# Patient Record
Sex: Male | Born: 1935 | Race: White | Hispanic: No | Marital: Married | Smoking: Former smoker
Health system: Southern US, Community
[De-identification: ages and names within clinical notes are randomized; demographics above are authoritative.]

## PROBLEM LIST (undated history)

## (undated) DIAGNOSIS — R2689 Other abnormalities of gait and mobility: Secondary | ICD-10-CM

## (undated) DIAGNOSIS — K2270 Barrett's esophagus without dysplasia: Secondary | ICD-10-CM

## (undated) DIAGNOSIS — R351 Nocturia: Secondary | ICD-10-CM

## (undated) DIAGNOSIS — R001 Bradycardia, unspecified: Secondary | ICD-10-CM

## (undated) DIAGNOSIS — K219 Gastro-esophageal reflux disease without esophagitis: Secondary | ICD-10-CM

## (undated) DIAGNOSIS — N2889 Other specified disorders of kidney and ureter: Secondary | ICD-10-CM

## (undated) DIAGNOSIS — C439 Malignant melanoma of skin, unspecified: Secondary | ICD-10-CM

## (undated) DIAGNOSIS — I2510 Atherosclerotic heart disease of native coronary artery without angina pectoris: Secondary | ICD-10-CM

## (undated) DIAGNOSIS — I219 Acute myocardial infarction, unspecified: Secondary | ICD-10-CM

## (undated) DIAGNOSIS — M1990 Unspecified osteoarthritis, unspecified site: Secondary | ICD-10-CM

## (undated) DIAGNOSIS — I10 Essential (primary) hypertension: Secondary | ICD-10-CM

## (undated) DIAGNOSIS — E7800 Pure hypercholesterolemia, unspecified: Secondary | ICD-10-CM

## (undated) DIAGNOSIS — Z951 Presence of aortocoronary bypass graft: Secondary | ICD-10-CM

## (undated) DIAGNOSIS — Z8679 Personal history of other diseases of the circulatory system: Secondary | ICD-10-CM

## (undated) HISTORY — PX: NASAL POLYP EXCISION: SHX2068

## (undated) HISTORY — PX: UPPER GI ENDOSCOPY: SHX6162

## (undated) HISTORY — PX: EYE SURGERY: SHX253

## (undated) HISTORY — PX: COLONOSCOPY: SHX174

## (undated) HISTORY — DX: Acute myocardial infarction, unspecified: I21.9

## (undated) HISTORY — DX: Pure hypercholesterolemia, unspecified: E78.00

## (undated) HISTORY — PX: HEMORRHOID SURGERY: SHX153

## (undated) HISTORY — DX: Nocturia: R35.1

## (undated) HISTORY — DX: Bradycardia, unspecified: R00.1

## (undated) HISTORY — DX: Presence of aortocoronary bypass graft: Z95.1

## (undated) HISTORY — DX: Atherosclerotic heart disease of native coronary artery without angina pectoris: I25.10

## (undated) HISTORY — DX: Personal history of other diseases of the circulatory system: Z86.79

## (undated) HISTORY — DX: Essential (primary) hypertension: I10

---

## 1998-02-23 ENCOUNTER — Other Ambulatory Visit: Admission: RE | Admit: 1998-02-23 | Discharge: 1998-02-23 | Payer: Self-pay | Admitting: Urology

## 1998-08-31 ENCOUNTER — Ambulatory Visit (HOSPITAL_COMMUNITY): Admission: RE | Admit: 1998-08-31 | Discharge: 1998-08-31 | Payer: Self-pay | Admitting: Gastroenterology

## 1998-11-30 ENCOUNTER — Ambulatory Visit (HOSPITAL_COMMUNITY): Admission: RE | Admit: 1998-11-30 | Discharge: 1998-11-30 | Payer: Self-pay | Admitting: Gastroenterology

## 1999-05-03 ENCOUNTER — Ambulatory Visit (HOSPITAL_COMMUNITY): Admission: RE | Admit: 1999-05-03 | Discharge: 1999-05-03 | Payer: Self-pay | Admitting: Urology

## 2000-04-09 ENCOUNTER — Encounter (INDEPENDENT_AMBULATORY_CARE_PROVIDER_SITE_OTHER): Payer: Self-pay | Admitting: Specialist

## 2000-04-09 ENCOUNTER — Ambulatory Visit (HOSPITAL_COMMUNITY): Admission: RE | Admit: 2000-04-09 | Discharge: 2000-04-09 | Payer: Self-pay | Admitting: Gastroenterology

## 2002-06-05 ENCOUNTER — Observation Stay (HOSPITAL_COMMUNITY): Admission: RE | Admit: 2002-06-05 | Discharge: 2002-06-06 | Payer: Self-pay | Admitting: Urology

## 2002-06-05 HISTORY — PX: OTHER SURGICAL HISTORY: SHX169

## 2002-08-04 ENCOUNTER — Encounter (INDEPENDENT_AMBULATORY_CARE_PROVIDER_SITE_OTHER): Payer: Self-pay | Admitting: *Deleted

## 2002-08-04 ENCOUNTER — Ambulatory Visit (HOSPITAL_COMMUNITY): Admission: RE | Admit: 2002-08-04 | Discharge: 2002-08-04 | Payer: Self-pay | Admitting: Gastroenterology

## 2002-08-21 ENCOUNTER — Observation Stay (HOSPITAL_COMMUNITY): Admission: RE | Admit: 2002-08-21 | Discharge: 2002-08-22 | Payer: Self-pay | Admitting: Urology

## 2004-01-04 ENCOUNTER — Encounter: Admission: RE | Admit: 2004-01-04 | Discharge: 2004-01-04 | Payer: Self-pay | Admitting: Gastroenterology

## 2005-08-09 ENCOUNTER — Ambulatory Visit: Payer: Self-pay | Admitting: Ophthalmology

## 2008-06-02 ENCOUNTER — Ambulatory Visit: Payer: Self-pay | Admitting: Ophthalmology

## 2008-07-08 ENCOUNTER — Ambulatory Visit: Payer: Self-pay | Admitting: Otolaryngology

## 2008-07-16 ENCOUNTER — Ambulatory Visit: Payer: Self-pay | Admitting: Otolaryngology

## 2009-03-03 ENCOUNTER — Ambulatory Visit: Payer: Self-pay | Admitting: Ophthalmology

## 2009-03-10 ENCOUNTER — Ambulatory Visit: Payer: Self-pay | Admitting: Ophthalmology

## 2009-12-14 ENCOUNTER — Encounter: Admission: RE | Admit: 2009-12-14 | Discharge: 2009-12-14 | Payer: Self-pay | Admitting: Cardiology

## 2010-01-04 ENCOUNTER — Ambulatory Visit (HOSPITAL_COMMUNITY): Admission: RE | Admit: 2010-01-04 | Discharge: 2010-01-04 | Payer: Self-pay | Admitting: Cardiology

## 2010-01-04 HISTORY — PX: CARDIAC CATHETERIZATION: SHX172

## 2010-01-06 ENCOUNTER — Ambulatory Visit: Payer: Self-pay | Admitting: Surgery

## 2010-02-03 ENCOUNTER — Ambulatory Visit: Payer: Self-pay | Admitting: Surgery

## 2010-02-03 ENCOUNTER — Encounter: Payer: Self-pay | Admitting: Surgery

## 2010-02-07 ENCOUNTER — Inpatient Hospital Stay (HOSPITAL_COMMUNITY): Admission: RE | Admit: 2010-02-07 | Discharge: 2010-02-11 | Payer: Self-pay | Admitting: Surgery

## 2010-02-07 ENCOUNTER — Ambulatory Visit: Payer: Self-pay | Admitting: Surgery

## 2010-02-07 HISTORY — PX: CORONARY ARTERY BYPASS GRAFT: SHX141

## 2010-02-28 ENCOUNTER — Ambulatory Visit: Payer: Self-pay | Admitting: Cardiology

## 2010-03-01 ENCOUNTER — Ambulatory Visit: Payer: Self-pay | Admitting: Surgery

## 2010-03-01 ENCOUNTER — Encounter: Admission: RE | Admit: 2010-03-01 | Discharge: 2010-03-01 | Payer: Self-pay | Admitting: Surgery

## 2010-03-03 ENCOUNTER — Encounter: Payer: Self-pay | Admitting: Cardiology

## 2010-03-31 ENCOUNTER — Encounter: Payer: Self-pay | Admitting: Cardiology

## 2010-03-31 ENCOUNTER — Ambulatory Visit: Payer: Self-pay | Admitting: Cardiology

## 2010-04-30 ENCOUNTER — Encounter: Payer: Self-pay | Admitting: Cardiology

## 2010-10-15 LAB — BASIC METABOLIC PANEL
BUN: 7 mg/dL (ref 6–23)
Creatinine, Ser: 1.14 mg/dL (ref 0.4–1.5)
GFR calc Af Amer: >60 mL/min (ref >60)
GFR calc non Af Amer: >60 mL/min (ref >60)
Glucose, Bld: 133 mg/dL — ABNORMAL HIGH (ref 70–99)

## 2010-10-15 LAB — MAGNESIUM: Magnesium: 1.8 mg/dL (ref 1.5–2.5)

## 2010-10-16 LAB — POCT I-STAT 4, (NA,K, GLUC, HGB,HCT)
Glucose, Bld: 122 mg/dL — ABNORMAL HIGH (ref 70–99)
Glucose, Bld: 124 mg/dL — ABNORMAL HIGH (ref 70–99)
Glucose, Bld: 135 mg/dL — ABNORMAL HIGH (ref 70–99)
HCT: 27 % — ABNORMAL LOW (ref 39.0–52.0)
HCT: 36 % — ABNORMAL LOW (ref 39.0–52.0)
HCT: 36 % — ABNORMAL LOW (ref 39.0–52.0)
Hemoglobin: 8.8 g/dL — ABNORMAL LOW (ref 13.0–17.0)
Potassium: 3.7 mEq/L (ref 3.5–5.1)
Potassium: 3.9 mEq/L (ref 3.5–5.1)
Potassium: 4.5 mEq/L (ref 3.5–5.1)
Sodium: 136 mEq/L (ref 135–145)
Sodium: 141 mEq/L (ref 135–145)
Sodium: 143 mEq/L (ref 135–145)

## 2010-10-16 LAB — SURGICAL PCR SCREEN: MRSA, PCR: NEGATIVE

## 2010-10-16 LAB — POCT I-STAT 3, ART BLOOD GAS (G3+)
Acid-base deficit: 1 mmol/L (ref 0.0–2.0)
Acid-base deficit: 4 mmol/L — ABNORMAL HIGH (ref 0.0–2.0)
Bicarbonate: 23.6 mEq/L (ref 20.0–24.0)
Bicarbonate: 25.2 mEq/L — ABNORMAL HIGH (ref 20.0–24.0)
O2 Saturation: 100 %
O2 Saturation: 100 %
O2 Saturation: 98 %
Patient temperature: 36.6
TCO2: 25 mmol/L (ref 0–100)
TCO2: 26 mmol/L (ref 0–100)
pCO2 arterial: 39.7 mmHg (ref 35.0–45.0)
pH, Arterial: 7.361 (ref 7.350–7.450)
pH, Arterial: 7.376 (ref 7.350–7.450)
pH, Arterial: 7.417 (ref 7.350–7.450)
pO2, Arterial: 112 mmHg — ABNORMAL HIGH (ref 80.0–100.0)

## 2010-10-16 LAB — COMPREHENSIVE METABOLIC PANEL
ALT: 20 U/L (ref 0–53)
AST: 17 U/L (ref 0–37)
Albumin: 4 g/dL (ref 3.5–5.2)
Alkaline Phosphatase: 57 U/L (ref 39–117)
BUN: 10 mg/dL (ref 6–23)
CO2: 21 mEq/L (ref 19–32)
Calcium: 9.1 mg/dL (ref 8.4–10.5)
Chloride: 106 mEq/L (ref 96–112)
Creatinine, Ser: 0.96 mg/dL (ref 0.4–1.5)
GFR calc non Af Amer: >60 mL/min (ref >60)
Total Bilirubin: 0.6 mg/dL (ref 0.3–1.2)

## 2010-10-16 LAB — POCT I-STAT, CHEM 8
Calcium, Ion: 1.09 mmol/L — ABNORMAL LOW (ref 1.12–1.32)
Chloride: 107 mEq/L (ref 96–112)
Creatinine, Ser: 1 mg/dL (ref 0.4–1.5)
Glucose, Bld: 139 mg/dL — ABNORMAL HIGH (ref 70–99)
HCT: 31 % — ABNORMAL LOW (ref 39.0–52.0)
Potassium: 4.1 mEq/L (ref 3.5–5.1)

## 2010-10-16 LAB — CBC
HCT: 28.1 % — ABNORMAL LOW (ref 39.0–52.0)
HCT: 32.6 % — ABNORMAL LOW (ref 39.0–52.0)
HCT: 32.8 % — ABNORMAL LOW (ref 39.0–52.0)
Hemoglobin: 10.3 g/dL — ABNORMAL LOW (ref 13.0–17.0)
Hemoglobin: 9.8 g/dL — ABNORMAL LOW (ref 13.0–17.0)
MCH: 34.5 pg — ABNORMAL HIGH (ref 26.0–34.0)
MCHC: 33.9 g/dL (ref 30.0–36.0)
MCHC: 34.9 g/dL (ref 30.0–36.0)
MCV: 100 fL (ref 78.0–100.0)
MCV: 100.5 fL — ABNORMAL HIGH (ref 78.0–100.0)
MCV: 100.6 fL — ABNORMAL HIGH (ref 78.0–100.0)
Platelets: 104 10*3/uL — ABNORMAL LOW (ref 150–400)
Platelets: 107 10*3/uL — ABNORMAL LOW (ref 150–400)
Platelets: 112 10*3/uL — ABNORMAL LOW (ref 150–400)
RBC: 2.81 MIL/uL — ABNORMAL LOW (ref 4.22–5.81)
RBC: 2.98 MIL/uL — ABNORMAL LOW (ref 4.22–5.81)
RDW: 12.7 % (ref 11.5–15.5)
RDW: 13 % (ref 11.5–15.5)
RDW: 13.1 % (ref 11.5–15.5)
RDW: 13.3 % (ref 11.5–15.5)
WBC: 11.3 10*3/uL — ABNORMAL HIGH (ref 4.0–10.5)
WBC: 12.1 10*3/uL — ABNORMAL HIGH (ref 4.0–10.5)
WBC: 13.8 10*3/uL — ABNORMAL HIGH (ref 4.0–10.5)
WBC: 8.4 10*3/uL (ref 4.0–10.5)
WBC: 9.3 10*3/uL (ref 4.0–10.5)

## 2010-10-16 LAB — URINALYSIS, ROUTINE W REFLEX MICROSCOPIC
Bilirubin Urine: NEGATIVE
Glucose, UA: NEGATIVE mg/dL
Hgb urine dipstick: NEGATIVE
Protein, ur: NEGATIVE mg/dL
Specific Gravity, Urine: 1.01 (ref 1.005–1.030)
Urobilinogen, UA: 0.2 mg/dL (ref 0.0–1.0)
pH: 6 (ref 5.0–8.0)

## 2010-10-16 LAB — BASIC METABOLIC PANEL
BUN: 10 mg/dL (ref 6–23)
CO2: 28 mEq/L (ref 19–32)
Chloride: 105 mEq/L (ref 96–112)
Chloride: 106 mEq/L (ref 96–112)
Creatinine, Ser: 1.04 mg/dL (ref 0.4–1.5)
GFR calc Af Amer: >60 mL/min (ref >60)
GFR calc Af Amer: >60 mL/min (ref >60)
GFR calc non Af Amer: >60 mL/min (ref >60)
Potassium: 4 mEq/L (ref 3.5–5.1)
Potassium: 4.2 mEq/L (ref 3.5–5.1)
Sodium: 135 mEq/L (ref 135–145)
Sodium: 137 mEq/L (ref 135–145)

## 2010-10-16 LAB — POCT I-STAT 3, VENOUS BLOOD GAS (G3P V)
Acid-base deficit: 2 mmol/L (ref 0.0–2.0)
Bicarbonate: 24.4 mEq/L — ABNORMAL HIGH (ref 20.0–24.0)
O2 Saturation: 79 %
TCO2: 26 mmol/L (ref 0–100)
pCO2, Ven: 47.6 mmHg (ref 45.0–50.0)
pO2, Ven: 47 mmHg — ABNORMAL HIGH (ref 30.0–45.0)

## 2010-10-16 LAB — GLUCOSE, CAPILLARY
Glucose-Capillary: 130 mg/dL — ABNORMAL HIGH (ref 70–99)
Glucose-Capillary: 131 mg/dL — ABNORMAL HIGH (ref 70–99)
Glucose-Capillary: 137 mg/dL — ABNORMAL HIGH (ref 70–99)
Glucose-Capillary: 80 mg/dL (ref 70–99)

## 2010-10-16 LAB — CREATININE, SERUM
Creatinine, Ser: 0.98 mg/dL (ref 0.4–1.5)
GFR calc non Af Amer: >60 mL/min (ref >60)

## 2010-10-16 LAB — PROTIME-INR
INR: 1.02 (ref 0.00–1.49)
INR: 1.32 (ref 0.00–1.49)
Prothrombin Time: 16.3 seconds — ABNORMAL HIGH (ref 11.6–15.2)

## 2010-10-16 LAB — PLATELET COUNT: Platelets: 119 10*3/uL — ABNORMAL LOW (ref 150–400)

## 2010-10-16 LAB — APTT: aPTT: 33 seconds (ref 24–37)

## 2010-10-16 LAB — POCT I-STAT GLUCOSE
Glucose, Bld: 109 mg/dL — ABNORMAL HIGH (ref 70–99)
Operator id: 3342

## 2010-10-16 LAB — BLOOD GAS, ARTERIAL
Acid-base deficit: 0.1 mmol/L (ref 0.0–2.0)
Drawn by: 206361
FIO2: 0.21 %
pCO2 arterial: 38.2 mmHg (ref 35.0–45.0)
pH, Arterial: 7.413 (ref 7.350–7.450)

## 2010-11-21 ENCOUNTER — Encounter: Payer: Self-pay | Admitting: Cardiology

## 2010-11-21 ENCOUNTER — Encounter: Payer: Self-pay | Admitting: *Deleted

## 2010-11-21 ENCOUNTER — Telehealth: Payer: Self-pay | Admitting: Cardiology

## 2010-11-21 ENCOUNTER — Encounter: Payer: Self-pay | Admitting: Nurse Practitioner

## 2010-11-21 ENCOUNTER — Ambulatory Visit (INDEPENDENT_AMBULATORY_CARE_PROVIDER_SITE_OTHER): Payer: BC Managed Care – PPO | Admitting: Nurse Practitioner

## 2010-11-21 DIAGNOSIS — I25708 Atherosclerosis of coronary artery bypass graft(s), unspecified, with other forms of angina pectoris: Secondary | ICD-10-CM | POA: Insufficient documentation

## 2010-11-21 DIAGNOSIS — I10 Essential (primary) hypertension: Secondary | ICD-10-CM

## 2010-11-21 DIAGNOSIS — I498 Other specified cardiac arrhythmias: Secondary | ICD-10-CM

## 2010-11-21 DIAGNOSIS — R001 Bradycardia, unspecified: Secondary | ICD-10-CM | POA: Insufficient documentation

## 2010-11-21 DIAGNOSIS — Z87898 Personal history of other specified conditions: Secondary | ICD-10-CM | POA: Insufficient documentation

## 2010-11-21 DIAGNOSIS — M79609 Pain in unspecified limb: Secondary | ICD-10-CM

## 2010-11-21 DIAGNOSIS — Z8679 Personal history of other diseases of the circulatory system: Secondary | ICD-10-CM | POA: Insufficient documentation

## 2010-11-21 DIAGNOSIS — I2510 Atherosclerotic heart disease of native coronary artery without angina pectoris: Secondary | ICD-10-CM

## 2010-11-21 DIAGNOSIS — E7800 Pure hypercholesterolemia, unspecified: Secondary | ICD-10-CM | POA: Insufficient documentation

## 2010-11-21 DIAGNOSIS — M79603 Pain in arm, unspecified: Secondary | ICD-10-CM | POA: Insufficient documentation

## 2010-11-21 DIAGNOSIS — R079 Chest pain, unspecified: Secondary | ICD-10-CM

## 2010-11-21 MED ORDER — LISINOPRIL 20 MG PO TABS: 20.0000 mg | ORAL_TABLET | Freq: Two times a day (BID) | ORAL | Status: DC

## 2010-11-21 NOTE — Assessment & Plan Note (Addendum)
Bradycardia noted on his EKG today. He is on Lopressor. He does not know if he is on tartrate or succinate. He will bring his bottles back at his next visit. It is difficult to say if his dizziness is related to his heart rate or because his blood pressure is up. I have left him on his current dose of Lopressor for now. He will continue to monitor his heart rate at home.

## 2010-11-21 NOTE — Assessment & Plan Note (Addendum)
Blood pressure remains elevated. He does not check it at home. He is going to get a cuff and keep a blood pressure diary. I have increased his Lisinopril to 20mg  BID. Prescription is sent to the drug store. It is difficult to say if his dizziness is due to his elevated blood pressure or a result of his bradycardia.

## 2010-11-21 NOTE — Telephone Encounter (Signed)
IS HAS CHEST PAIN ONCE IN A WHILE ON TOP OF CHEST BONE ON LEFT SIDE.  MAY 5 TO 15 MINS.  GOING ON FOR SEVERAL DAYS.

## 2010-11-21 NOTE — Patient Instructions (Signed)
Increase the Lisinopril to 20mg  two times a day. Stay on your other medicines. I will have you see [name] in 10 to 14 days. Use tylenol if needed for your discomfort.  Get a blood pressure cuff and start checking your blood pressure Watch your salt. Call for any problems.

## 2010-11-21 NOTE — Assessment & Plan Note (Signed)
We will keep him on his other cardiac medicines for now.

## 2010-11-21 NOTE — Telephone Encounter (Signed)
Scheduled appointment for patient to see Lawson Fiscal (NP) today at 3:00

## 2010-11-21 NOTE — Assessment & Plan Note (Addendum)
His discomfort does not sound cardiac. It is not like his prior chest pain symdrome from last summer. His EKG is normal except for the bradycardia. I suspect this is more related to a cervical/muscular issue. I have asked him to try some Tylenol. If it persists, we will proceed with further testing. Patient and his wife are agreeable to this plan and will call if any problems develop in the interim.

## 2010-11-21 NOTE — Progress Notes (Signed)
Zachary Hall Date of Birth: Aug 08, 1934   History of Present Illness: [name] is seen today for a work in visit. He is seen for [name]. He has noticed some soreness in his right arm. He has had a shooting pain that is brought on with movement. He is not able to sleep on his right side. Today he got worried because he had a sore place over his left clavicle. It is not exertional. He has no other associated symptoms. It only lasts for less than a minute or so. He notes that his heart rate is in the 50 to 60's at home. He does not check his blood pressure. He has had some transient dizziness. He was seen at [name] last month and his Lisinopril was increased to 20mg . He does not use excessive salt.   Current Outpatient Prescriptions on File Prior to Visit  Medication Sig Dispense Refill  . aspirin 325 MG tablet Take 325 mg by mouth daily.        [name] atorvastatin (LIPITOR) 10 MG tablet Take 10 mg by mouth daily.        . Cetirizine HCl (ZYRTEC PO) Take 1 tablet by mouth daily as needed.        [name] CHERRY PO Take 1 capsule by mouth daily. ** Black Cherry **       . metoprolol tartrate (LOPRESSOR) 25 MG tablet Take 25 mg by mouth 2 (two) times daily.       . multivitamin (THERAGRAN) per tablet Take 1 tablet by mouth daily.        . nitroGLYCERIN (NITROSTAT) 0.4 MG SL tablet Place 0.4 mg under the tongue every 5 (five) minutes as needed.        . Omega-3 Fatty Acids (OMEGA 3 PO) Take 1 capsule by mouth daily.        [name] lisinopril (PRINIVIL,ZESTRIL) 20 MG tablet Take 1 tablet (20 mg total) by mouth 2 (two) times daily.  60 tablet  11    No Known Allergies  Past Medical History  Diagnosis Date  . Hypertension   . Hypercholesterolemia   . Coronary artery disease   . Nocturia   . History of atrial fibrillation     Previously on amiodarone  . Bradycardia   . Hx of CABG   . Gout     Past Surgical History  Procedure Date  . Cardiac catheterization 01/04/2010  . Coronary  artery bypass graft 02/07/2010    LIMA to LAD, SVG to 2nd DX, SVG to OM  . Nasal polyp excision   . Hemorrhoid surgery     x 2    History  Smoking status  . Former Smoker  . Types: Cigarettes  . Quit date: 08/01/1959  Smokeless tobacco  . Never Used    History  Alcohol Use No    Family History  Problem Relation Age of Onset  . Alcohol abuse Father   . Colon cancer Mother     Review of Systems: The review of systems is positive for feeling cold. He attributes that to his medicines. He has no symptoms that are similar to his presentation last summer leading to CABG. No indigestion. No palpitations.  All other systems were reviewed and are negative.  Physical Exam: BP 160/90  Pulse 50  Ht 5\' 11"  (1.803 m)  Wt 191 lb (86.637 kg)  BMI 26.64 kg/m2 Patient is very pleasant and in no acute distress. Skin is warm and dry. Color is  normal.  HEENT is unremarkable. Normocephalic/atraumatic. PERRL. Sclera are nonicteric. Neck is supple. No masses. No JVD. Lungs are clear. Cardiac exam shows a regular rate and rhythm. Abdomen is soft. Extremities are without edema. Gait and ROM are intact. No gross neurologic deficits noted.  LABORATORY DATA:  EKG today shows sinus bradycardia. Rate is 46.   Assessment / Plan:

## 2010-12-13 NOTE — Assessment & Plan Note (Signed)
OFFICE VISIT   Zachary Hall, Zachary Hall  DOB:  1935-06-10                                        March 01, 2010  CHART #:  [id number]   HISTORY:  The patient has returned to my office today for followup  status post coronary artery bypass graft surgery x3 on February 07, 2010.  He did develop some postoperative atrial fibrillation just prior to  discharge and was sent home on amiodarone in addition to Lopressor 25 mg  b.i.d.  Since discharge, he said he has continued to feel well overall.  He has had some slight dizziness at times.  He has noticed a slow heart  rate in the 40s.  He has not had any chest pain or shortness of breath.  He saw [name] in the office yesterday and was told to decrease  his amiodarone to 200 mg daily due to heart rate of 40.  He is supposed  to follow up with [name] in 1 month.  He is planning on starting  cardiac rehab tomorrow.   PHYSICAL EXAMINATION:  Today, blood pressure 116/68 and pulse is 40 and  regular.  Respiratory rate is 16 and unlabored.  Oxygen saturation on  room air is 99%.  He looks well.  Cardiac exam shows regular rate and  rhythm with normal heart sounds.  Lung exam is clear.  Chest incision is  healing well and the sternum is stable.  His right leg incision is  healing well.  There is no peripheral edema.   DIAGNOSTIC TESTS:  Followup chest x-ray is clear.   MEDICATIONS:  1. Lipitor 10 mg daily.  2. Aspirin 325 mg daily.  3. Zyrtec daily.  4. Multivitamin daily.  5. Amiodarone 200 mg daily.  6. Lopressor 25 mg b.i.d.  7. Colace 1 mg nightly.  8. Lisinopril 10 mg daily.  9. Nasacort p.r.n.   IMPRESSION:  The patient is making good recovery following his surgery.  He has bradycardia with a heart rate around 40 and I think it probably  be best to completely discontinue his amiodarone at this time since he  only had brief postoperative atrial fibrillation and he has been  maintained on Lopressor 25  mg b.i.d.  I told him to completely  discontinue the amiodarone and told him that I would like Dr.  Yevonne Pax office to know that.  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  He has a followup appointment with [name] in 1 month  and will contact me if he develops any problems with his incisions.   Evelene Croon, M.D.  Electronically Signed   BB/MEDQ  D:  03/01/2010  T:  03/02/2010  Job:  [id number]   cc:   Cassell Clement, M.D.  Richard Sullivan Lone

## 2010-12-13 NOTE — Consult Note (Signed)
NEW PATIENT CONSULTATION   Zachary Hall, Zachary Hall  DOB:  18-Dec-1934                                        January 12, 2010  CHART #:  [id number]   REASON FOR CONSULTATION:  Severe two-vessel obstructive coronary artery  disease.   CLINICAL HISTORY:  I was asked by Dr. Swaziland to evaluate the patient for  consideration of coronary artery bypass graft surgery.  He is a 76-year-  old gentleman, who is evaluated for symptoms of lightheadedness and  chest pressure associated with palpitations that started about 1 year  ago.  The symptoms went away for awhile and then recurred about 1 month  ago.  The symptoms were not associated with any physical exertion.  He  underwent a stress Cardiolite study which showed a moderately large area  of ischemia involving the anterior septum and apex.  Left ventricular  function was normal with an ejection fraction of 59%.  He had an  echocardiogram performed which showed mild left ventricular hypertrophy  with normal systolic function and impaired relaxation.  There is mild  aortic sclerosis without stenosis or regurgitation.  There is no mitral  stenosis and trace mitral regurgitation.  The patient subsequently  underwent cardiac catheterization on January 04, 2010, via the right radial  artery which showed the LAD to be totally occluded after the first  diagonal branch.  There were mild left collaterals to the diagonal  branches.  The LAD proper filled by right to left collaterals.  The left  circumflex was a relatively small vessel which had a single marginal  branch.  There was 70-80% stenosis in the mid left circumflex.  The  right coronary artery was a large dominant vessel that had scattered  irregularities, but less than 20% stenosis.  Left ventricular function  was normal with an ejection fraction of 65%.   REVIEW OF SYSTEMS:  His review of systems is as follows.  GENERAL:  He denies any fever or chills.  He has had no recent  weight  changes.  He has had some fatigue.  EYES:  Negative.  ENT:  Negative.  ENDOCRINE:  He denies diabetes and hypothyroidism.  CARDIOVASCULAR:  As above.  He has had some chest pressure, but not  necessarily associated with exertion.  He denies any PND or orthopnea.  He has had no peripheral edema or palpitations.  He denies exertional  dyspnea.  RESPIRATORY:  He denies cough and sputum production.  He has had some  wheezing.  GI:  He denies nausea or vomiting.  He denies melena and bright red  blood per rectum.  GU:  He denies dysuria and hematuria.  VASCULAR:  He denies claudication and phlebitis.  NEUROLOGICAL:  He has had some dizziness.  He denies any focal weakness  or numbness.  He has never had TIA or stroke.  MUSCULOSKELETAL:  He denies arthralgias and myalgias.  PSYCHIATRIC:  Negative.  HEMATOLOGICAL:  Negative.   ALLERGIES:  None.   MEDICATIONS:  1. Lipitor 10 mg daily.  2. Lisinopril 10 mg daily.  3. Black Cherry Concentrate daily.  4. Aspirin 325 mg daily.  5. Sublingual nitroglycerin p.r.n.  6. Flonase daily.  7. Advair Diskus p.r.n.  8. Zyrtec daily.  9. Multivitamin daily.   PAST MEDICAL HISTORY:  Significant for hypertension,  hypercholesterolemia, and gout.  He had  2 hemorrhoid operations in the  past and removal of polyps from his nose.   SOCIAL HISTORY:  He is married and has 2 children.  He is retired.  He  was a previous smoker, but quit in 1961.  He does drink alcohol  occasionally.   FAMILY HISTORY:  His father died of alcoholism at age 72.  Mother died  of colon cancer at age 70.  He has no siblings.   PHYSICAL EXAMINATION:  Vital Signs:  His blood pressure is 136/78, pulse  is 60 and regular, and respiratory rate 16, unlabored.  Oxygen  saturation on room air is 96%.  He is 5 feet 11 inches tall, weighs 187  pounds.  General:  He is a well-developed white male, in no distress.  HEENT:  Normocephalic and atraumatic.  Pupils are equal and  reactive to  light and accommodation.  Extraocular muscles are intact.  Throat is  clear.  Neck:  Normal carotid pulses bilaterally.  There are no bruits.  There is no adenopathy or thyromegaly.  Cardiac:  Regular rate and  rhythm with normal S1 and S2.  There is no murmur, rub, or gallop.  Lungs:  Clear.  Abdomen:  Active bowel sounds.  His abdomen is soft and  nontender.  There are no palpable masses or organomegaly.  Extremities:  No peripheral edema.  Pedal pulses are palpable bilaterally.  Skin:  Warm and dry.  Neurologic:  Alert and oriented x3.  Motor and sensory  exams grossly normal.   IMPRESSION:  The patient has severe two-vessel coronary disease with an  abnormal stress test showing a moderately large area of ischemia  involving anterior septum and apex.  I think his left anterior  descending coronary artery and hopefully a diagonal branch would be  graftable.  I do not know whether his obtuse marginal branch would be  graftable since it is relatively small and lying high in the lateral  wall.  I agree that coronary artery bypass graft surgery is probably the  best option for him.  He is not a candidate for percutaneous coronary  intervention.  I discussed the operative procedure of coronary bypass  surgery with he and his wife.  We discussed alternatives, benefits, and  risks including, but not limited to bleeding, blood transfusion,  infection, stroke, myocardial infarction, graft failure, and death.  He  understands and would like to proceed with surgery.  We will plan to do  this in the next few weeks.  He would like to wait until after the January 31, 2010, holiday.   Evelene Croon, M.D.  Electronically Signed   BB/MEDQ  D:  01/12/2010  T:  01/13/2010  Job:  [id number]   cc:   Peter M. Swaziland, M.D.  Richard Sullivan Lone

## 2010-12-15 ENCOUNTER — Encounter: Payer: Self-pay | Admitting: Cardiology

## 2010-12-15 ENCOUNTER — Ambulatory Visit (INDEPENDENT_AMBULATORY_CARE_PROVIDER_SITE_OTHER): Payer: BC Managed Care – PPO | Admitting: Cardiology

## 2010-12-15 VITALS — BP 180/80 | HR 56 | Wt 187.0 lb

## 2010-12-15 DIAGNOSIS — Z9889 Other specified postprocedural states: Secondary | ICD-10-CM

## 2010-12-15 DIAGNOSIS — Z951 Presence of aortocoronary bypass graft: Secondary | ICD-10-CM | POA: Insufficient documentation

## 2010-12-15 DIAGNOSIS — M79603 Pain in arm, unspecified: Secondary | ICD-10-CM

## 2010-12-15 DIAGNOSIS — I10 Essential (primary) hypertension: Secondary | ICD-10-CM

## 2010-12-15 DIAGNOSIS — I119 Hypertensive heart disease without heart failure: Secondary | ICD-10-CM

## 2010-12-15 DIAGNOSIS — M79609 Pain in unspecified limb: Secondary | ICD-10-CM

## 2010-12-15 MED ORDER — NITROGLYCERIN 0.4 MG SL SUBL: 0.4000 mg | SUBLINGUAL_TABLET | SUBLINGUAL | Status: DC | PRN

## 2010-12-15 MED ORDER — LISINOPRIL 20 MG PO TABS: 20.0000 mg | ORAL_TABLET | Freq: Two times a day (BID) | ORAL | Status: DC

## 2010-12-15 MED ORDER — AMLODIPINE BESYLATE 5 MG PO TABS: 5.0000 mg | ORAL_TABLET | Freq: Every day | ORAL | Status: DC

## 2010-12-15 NOTE — Assessment & Plan Note (Signed)
The patient has right arm pain which comes and goes.  There is no real pattern to it.  It does not appear related to his cardiac condition if it persists he will followup with his primary care provider.

## 2010-12-15 NOTE — Assessment & Plan Note (Signed)
The patient has a history of hypertension.  He purchased a blood pressure cuff which has been giving him high readings.  He brought the blood pressure Cuff in to the office today and we calibrated it with our cuff and his cuff is running high on both a systolic and diastolic.  We checked his wife's blood pressure and her blood pressure is also running higher on the machine than on our cuff.  He is going to look into getting a different blood pressure machine for home use.  He has not been is noticing any headaches or dizziness or symptoms of high blood pressure.

## 2010-12-15 NOTE — Progress Notes (Signed)
Zachary Hall Date of Birth:  17-Mar-1935 [facility] [address]   [address] [place], [place]  [address] [phone]           Fax   [phone]  History of Present Illness: This pleasant 76 year old gentleman is seen for a scheduled followup office visit.  He has known coronary artery disease.  He had a strongly positive treadmill Cardiolite stress test which led to cardiac catheterization by Dr. Swaziland.  He was found to have severe 2 vessel obstructive disease with a chronic total occlusion of his mid LAD and his anatomy was not well suited to percutaneous intervention.  He went on to have coronary bypass graft surgery by Dr. Laneta Simmers on 02/07/10.  Postoperatively he had paroxysmal atrial fibrillation. He participated in the cardiac rehabilitation program at [facility].  Recently he's been having a problem with significant systolic hypertension.  This has not been causing him any symptoms.  Current Outpatient Prescriptions  Medication Sig Dispense Refill  . aspirin 325 MG tablet Take 325 mg by mouth daily.        [name] atorvastatin (LIPITOR) 10 MG tablet Take 10 mg by mouth daily.        . Cetirizine HCl (ZYRTEC PO) Take 1 tablet by mouth daily as needed.        [name] CHERRY PO Take 1 capsule by mouth daily. ** Black Cherry **       . lisinopril (PRINIVIL,ZESTRIL) 20 MG tablet Take 1 tablet (20 mg total) by mouth 2 (two) times daily.  60 tablet  11  . metoprolol tartrate (LOPRESSOR) 25 MG tablet Take 25 mg by mouth 2 (two) times daily.       . multivitamin (THERAGRAN) per tablet Take 1 tablet by mouth daily.        . nitroGLYCERIN (NITROSTAT) 0.4 MG SL tablet Place 1 tablet (0.4 mg total) under the tongue every 5 (five) minutes as needed.  25 tablet  3  . Omega-3 Fatty Acids (OMEGA 3 PO) Take 1 capsule by mouth daily.        [name] DISCONTD: lisinopril (PRINIVIL,ZESTRIL) 20 MG tablet Take 1 tablet (20 mg total) by mouth 2 (two) times daily.  60 tablet  11  .  DISCONTD: nitroGLYCERIN (NITROSTAT) 0.4 MG SL tablet Place 0.4 mg under the tongue every 5 (five) minutes as needed.       [name] amLODipine (NORVASC) 5 MG tablet Take 1 tablet (5 mg total) by mouth daily.  30 tablet  11    No Known Allergies  Patient Active Problem List  Diagnoses  . Hypertension  . Hypercholesterolemia  . Coronary artery disease  . History of atrial fibrillation  . History of chest pain  . Arm pain  . Bradycardia  . Hx of CABG    History  Smoking status  . Former Smoker  . Types: Cigarettes  . Quit date: 08/01/1959  Smokeless tobacco  . Never Used    History  Alcohol Use No    Family History  Problem Relation Age of Onset  . Alcohol abuse Father   . Colon cancer Mother     Review of Systems: Constitutional: no fever chills diaphoresis or fatigue or change in weight.  Head and neck: no hearing loss, no epistaxis, no photophobia or visual disturbance. Respiratory: No cough, shortness of breath or wheezing. Cardiovascular: No chest pain peripheral edema, palpitations. Gastrointestinal: No abdominal distention, no abdominal pain, no change in bowel habits hematochezia or  melena. Genitourinary: No dysuria, no frequency, no urgency, no nocturia. Musculoskeletal:No arthralgias, no back pain, no gait disturbance or myalgias. Neurological: No dizziness, no headaches, no numbness, no seizures, no syncope, no weakness, no tremors. Hematologic: No lymphadenopathy, no easy bruising. Psychiatric: No confusion, no hallucinations, no sleep disturbance.    Physical Exam: Filed Vitals:   12/15/10 1438  BP: 180/80  Pulse: 56  The general appearance reveals a well-developed well-nourished gentleman in no distress.Pupils equal and reactive.   Extraocular Movements are full.  There is no scleral icterus.  The mouth and pharynx are normal.  The neck is supple.  The carotids reveal no bruits.  The jugular venous pressure is normal.  The thyroid is not enlarged.  There is  no lymphadenopathy.The chest is clear to percussion and auscultation. There are no rales or rhonchi. Expansion of the chest is symmetrical.The precordium is quiet.  The first heart sound is normal.  The second heart sound is physiologically split.  There is no murmur gallop rub or click.  There is no abnormal lift or heave.The abdomen is soft and nontender. Bowel sounds are normal. The liver and spleen are not enlarged. There Are no abdominal masses. There are no bruits.The pedal pulses are good.  There is no phlebitis or edema.  There is no cyanosis or clubbing.Strength is normal and symmetrical in all extremities.  There is no lateralizing weakness.  There are no sensory deficits.The skin is warm and dry.  There is no rash.   Assessment / Plan: Continue same medication and we will add amlodipine 5 mg daily.  Recheck in 3 months for followup office visit.  He will also get a different home blood pressure cuff.

## 2010-12-15 NOTE — Assessment & Plan Note (Signed)
The patient has a past history of coronary artery bypass graft surgery on 02/07/10 by Dr. Laneta Simmers.  He has done well post surgery and has had no recurrent angina pectoris.  He still carries nitroglycerin with him and we gave him a prescription for a new bottle today.

## 2010-12-16 NOTE — Op Note (Signed)
[place]. [facility]  Patient:    Zachary Hall, Zachary Hall                 MRN: [id number] Proc. Date: 04/09/00 Adm. Date:  [id number] Attending:  Rich Brave CC:         Anna Genre. Little, M.D.  Timothy E. Earlene Plater, M.D.   Operative Report  OPERATION PERFORMED:  Colonoscopy with polypectomy.  INDICATION:  Recurrent polyp in the right colon at the site of a large sessile polyp in that location.  FINDINGS:  A small amount of recurrent polyp tissue snared off.  One tiny additional polyp identified.  DESCRIPTION OF PROCEDURE:  The nature and purpose and risks of the procedure were familiar to the patient from prior examination.  He provided written consent.  Sedation was fentanyl 87.5 mcg and Versed 7 mg IV without arrhythmias or desaturation.  The patient did run a slightly high blood pressure, as much as 175/113, during the course of the procedure, be remained, clinically, stable, otherwise.  Digital exam showed a fairly flat prostate bed.  No masses were felt.  The Olympus video colonoscope was advanced to the cecum and pull-back was initiated.  As before, we had to turn the patient into the supine position and apply some right lower quadrant and suprapubic abdominal pressure to help get around the hepatic flexure.  On pull-back, the quality of the prep was excellent and it is felt that all areas were well seen.  The site of the tattoo from his original polypectomy was readily visible a short distance above the cecum.  Adjacent to this tattoo was a 2 x 3 mm sessile polyp removed by two cold biopsies.  Approximately half-way around the colonic circumference, at the level of the tattoo, I encountered some residual polypoid tissue, probably measuring about 1 cm across.  This area was then injected with epinephrine using the sclerotherapy needle and then snared off in two pieces.  This appeared to, essentially, extirpate all the tissue.  These  fragments were recovered by a combination of suction and by extracting the polypectomy snare with the fragment trapped in the wire through the scope.  Pull-back was then continued around the colon.  No further polyps were seen and no large masses or cancer were observed.  There was probably some left side diverticular disease.  However, this did not appear to be extremely extensive.  No vascular malformations or colitis were observed.  Retroflexion was not performed in the rectum, but the patient did have rather prominent internal hemorrhoids seen during pull-out through the anal canal.  The patient tolerated the procedure well and there were no apparent complications.  IMPRESSION: 1. Residual polyp tissue at site of previous large sessile polyp polypectomy,    performed initially about a year and a half ago with subsequent "touch-up"    about three months later. 2. Tiny sessile polyp near the other polyp, removed as described above. 3. Probable left side diverticulosis.  PLAN:  Await pathology.  Anticipate endoscopic follow up in two years, unless high-grade dysplasia is noted on the current tissue. DD:  04/09/00 TD:  04/09/00 Job: [id number]

## 2010-12-16 NOTE — Op Note (Signed)
NAME:  Zachary Hall, Zachary Hall                    ACCOUNT NO.:  [id number]   MEDICAL RECORD NO.:  [id number]                   PATIENT TYPE:  AMB   LOCATION:  DAY                                  FACILITY:  [facility]   PHYSICIAN:  Sigmund I. Patsi Sears, M.D.         DATE OF BIRTH:  Sep 16, 1934   DATE OF PROCEDURE:  06/05/2002  DATE OF DISCHARGE:                                 OPERATIVE REPORT   PREOPERATIVE DIAGNOSES:  Peyronie's disease with erectile dysfunction.   POSTOPERATIVE DIAGNOSES:  Peyronie's disease with erectile dysfunction.   OPERATION:  Insertion of Mentor Titan prosthesis (18 cm cylinder with 3 cm  rear tip extension).   SURGEON:  Sigmund I. Patsi Sears, M.D.   ANESTHESIA:  General endotracheal.   COMPLICATIONS:  None.   PREPARATION:  After appropriate preanesthesia, the patient's brought to the  operating room, placed on the operating table in dorsal supine position  where general endotracheal anesthesia was introduced. He remained in this  position, where the pubis was washed with Betadine solution, prepped with  Betadine solution. He was then draped in the usual fashion.   DESCRIPTION OF PROCEDURE:  A 5 cm semilunar infrapubic incision was made in  the subcutaneous tissue, dissected with the electrosurgical unit. A Foley  catheter was previously placed to straight drainage. The self retaining  retractor was placed, and the corpora cavernosa were identified bilaterally,  and two separate 3-0 Vicryl sutures were placed in the corpora cavernosa  bilaterally. Corporotomies were made and the subcutaneous tissue dissected  proximal and distalward. The corpora measured 21 cm in length bilaterally,  and both corpora were dilated with some difficulty secondary to the  patient's Peyronie's disease. It is interesting that the incision in the  corpora was right over the location of Peyronie's plaque. Measurement of the  corpora was accomplished and the proximal incision in the  corpora was 7 cm  from the proximal edge of the corpora cavernosum. The dissection was then  accomplished under the rectus muscle and the reservoir placed in standard  fashion. Sodium 5 cc  was placed in the reservoir and the fascia was closed  over the top of the reservoir without the back pressure.  The reservoir was  tested and there was no back pressure. The corpora cylinders were then  placed, first with 2 cm rear tip extension and then with 3 cm rear tip  extension. With inflation, and 3 cm rear tip extension, it was felt that the  prosthesis seated better underneath the corpora spongiosum (glans), and  there was no bucking in the cylinders. Therefore the corporotomies were  closed with running 3-0 Vicryl suture. The pump was then placed in the  scrotum and the connection was then made between the reservoir and the pump  with the standard straight quick snap connector. Following this, the wound  was copiously irrigated again, as it had been during the case. The wound was  closed in separate layers with running  3-0 Vicryl sutures. The skin was  closed with skin staples. A sterile dressing was applied and the patient was  awakened and taken to the recovery room in good condition. Note that the  procedure was covered with two antibiotics.                                              Sigmund I. Patsi Sears, M.D.   SIT/MEDQ  D:  06/05/2002  T:  06/05/2002  Job:  [id number]

## 2010-12-16 NOTE — Op Note (Signed)
NAME:  Zachary Hall, Zachary Hall                    ACCOUNT NO.:  [id number]   MEDICAL RECORD NO.:  [id number]                   PATIENT TYPE:  AMB   LOCATION:  ENDO                                 FACILITY:  [facility]   PHYSICIAN:  Bernette Redbird, M.D.                DATE OF BIRTH:  02-27-1935   DATE OF PROCEDURE:  DATE OF DISCHARGE:                                 OPERATIVE REPORT   PROCEDURE:  Colonoscopy.   INDICATION:  A 76 year old gentleman with a prior history of large colonic  adenoma in the proximal colon, removed and tattooed several years ago with  surveillance colonoscopy two years ago showing just a small amount of  residual tissue.   FINDINGS:  An 8 mm polyp removed from the same general region as the  previous large polyp.  Right-sided diverticulosis.   INFORMED CONSENT:  The nature, purpose, and risks of the procedure were  familiar to the patient and provided written consent.   DESCRIPTION OF PROCEDURE:  Fentanyl 100 mcg and Versed 8 mg IV without  arrhythmias or desaturation. Digital examination was unremarkable.  Adult  video colonoscope was advanced with some looping overcome by external  abdominal compression with the patient in the supine position and the cecum  was reached.  Pullback was then performed.   Just above the cecum I saw the tattooed area from the site of this previous  polypectomy.  There is quite a bit of diverticula disease in this section of  the colon.   At about 90-degree orientation from the tatoo on the circumference of the  colon, I encountered an 8 mm sessile polyp.  This was injected with  epinephrine with good blanching and then snared off in a single piece,  although it basically fragmented into two pieces which were retrieved by  suctioning through the scope. There was complete hemostasis and no evidence  of excessive cautery nor of any residual polyp tissue at the polypectomy  site.   No other polyps were observed elsewhere in the  colon and there was no  evidence of cancer, colitis or vascular ectasia.  Retroflexion of the rectum  as well as re-inspection of the rectosigmoid was otherwise unremarkable.  Note that the patient has a history of rectal bleeding but no definite  source for the rectal bleeding was identified on this examination.  The  patient tolerated the procedure well and there were no apparent  complications.   IMPRESSION:  1. Medium sized polyp in the proximal ascending colon near the site of this     previous large colonic adenoma.  2. Right-sided diverticulosis.   PLAN:  Await pathology results.  Probably colonoscopic followup in two  years.  Bernette Redbird, M.D.   RB/MEDQ  D:  08/04/2002  T:  08/04/2002  Job:  [id number]

## 2010-12-16 NOTE — Op Note (Signed)
NAME:  Zachary, Hall                    ACCOUNT NO.:  [id number]   MEDICAL RECORD NO.:  [id number]                   PATIENT TYPE:  AMB   LOCATION:  DAY                                  FACILITY:  [facility]   PHYSICIAN:  Sigmund I. Patsi Sears, M.D.         DATE OF BIRTH:  03-Aug-1934   DATE OF PROCEDURE:  08/21/2002  DATE OF DISCHARGE:                                 OPERATIVE REPORT   PREOPERATIVE DIAGNOSIS:  Peyronie's disease with malfunction of penile  prosthesis.   POSTOPERATIVE DIAGNOSIS:  Peyronie's disease with malfunction of penile  prosthesis.   OPERATION:  1. Infrapubic exploration.  2. Explantation of Mentor piping prosthesis.  3. Replacement of Mentor piping prosthesis.   SURGEON:  Sigmund I. Patsi Sears, M.D.   ANESTHESIA:  General endotracheal.   PREPARATION:  After appropriate preanesthesia, the patient is brought to the  operating room and placed on the operating table in the dorsal supine  position where general endotracheal anesthesia was introduced.  He was then  placed in the dorsal lithotomy position where the pubis was prepped with  Betadine solution and draped in the usual fashion.   DESCRIPTION OF PROCEDURE:  The suprapubic area was shaved and then prepped,  but the scrotum was not shaved in accordance with the patient's desire.  By  history, the patient underwent implantation of the 18 cm Titan prosthesis  with 3 cm rear tip extension on 06/05/02 with molding of the penis.  The  patient has had difficulty following his surgery because the prosthesis  cannot cycle.  It did cycle in the operating room, however.  He is therefore  for exploration, and repair of his prosthesis.   The original centimeter incision was excised, and subcutaneous tissue was  dissected with the electrosurgical unit.  The corpora was identified  bilaterally, and the tubing was identified as it traversed the corpora  cavernosa.  Each corpora cavernosa was then incised, and  the cylinders were  removed.  With cycling of the prosthesis in the operating room, it was  apparent that the right prosthesis traversed the corpora to the left corpus,  and that the left cylinder appeared to be short.  Once the prostheses were  removed, a repeat corporal dilation was accomplished, and it was found that  there was distal corporal movement of the cylinder from the right side to  the left side, as previously anticipated.  In addition, the right proximal  corpus appeared to be weakened, and the 3 cm rear tip extender appeared to  traverse the fascial base medially.  It was felt that this may have happened  during the molding portion of the cylinder MiG dilation when the patient had  his Peyronie's molding.   With the correct size of 18 cm cylinders and the 3 cm rear tip extenders, a  3 cm rear tip extender was placed on the left side with 2-0 Vicryl suture,  suturing in the proximal cylinder at the 2  o'clock and the 4 o'clock  positions.  A 2 x 7 portion of Tutoplast fascia was also placed as a  windsock proximal to this.  The cylinder was then placed, and the needle was  carefully brought through the glans in standard fashion.  This was cycled,  and it seemed to be in excellent position with no buckling.  When originally  placed, it was felt that the rear tip extender was too shallow, and it was  re-placed in a deeper position and resutured in place prior to placement of  the cylinder.  With excellent cycling of the prosthesis on the left side, it  was elected to place the 18 cm cylinder on the right side, and this was  accomplished with a 3 cm rear tip extender with great care taken to avoid  not only the urethra, but the area of crossover distally.  This was  accomplished, and it was noted that a Foley catheter had previously been  placed.   The two prostheses were then cycled, and cycled well.  There was no buckling  noted.  The corpora were then closed carefully with  running 2-0 Vicryl  suture.  Because of the weakened corpora and multiple corporotomies, it was  elected to place Tutoplast fascia over the corporotomies, and a 4 x 7  portion of Tutoplast fascia was used and cut in half, and each half was used  to cover the corpora cavernosa.  Following this, the reservoir was emptied  and refilled with 75 cubic centimeters of saline.  There were 3 cm of back  pressure, for a total of 72 cubic centimeters in the reservoir.  The  cylinders were cycled, and they cycled well.  A straight connector was used  to connect the pump to the reservoir, and the pump had previously been  placed into the scrotum in a new pouch, more dependent.  After placing the  pump, the connection was made with the reservoir in a standard fashion.  Antibiotic irrigation was then used, and the wound was closed in multiple  layers with 2-0 and 3-0 Vicryl.  Skin stapler was used.  The patient was  then awakened and taken to the recovery room in good condition.                                               Sigmund I. Patsi Sears, M.D.    SIT/MEDQ  D:  08/21/2002  T:  08/21/2002  Job:  [id number]

## 2011-01-04 ENCOUNTER — Inpatient Hospital Stay: Payer: Self-pay | Admitting: Internal Medicine

## 2011-01-04 DIAGNOSIS — I498 Other specified cardiac arrhythmias: Secondary | ICD-10-CM

## 2011-01-09 ENCOUNTER — Telehealth: Payer: Self-pay | Admitting: Cardiovascular Disease

## 2011-01-09 NOTE — Telephone Encounter (Signed)
HR is low and pt is c/o dizzy spells.

## 2011-01-09 NOTE — Telephone Encounter (Signed)
Spoke to pt, he was d/c from [facility] last thur 01/05/11 and will f/u with Dr. Mariah Milling this thur 01/12/11. Pt states that his HR has been running 50-55 (which looks like his baseline from pcp note) and has had a couple of dizzy spells since d/c. Pt states that this is nothing new from when he was in the hospital and he said it is nothing that could not wait until his f/u. I have advised pt to monitor BP/HR daily until this appt and to call me with any changes, or if HR <50. Pt ok with this.

## 2011-01-12 ENCOUNTER — Encounter: Payer: Self-pay | Admitting: Cardiovascular Disease

## 2011-01-12 ENCOUNTER — Ambulatory Visit (INDEPENDENT_AMBULATORY_CARE_PROVIDER_SITE_OTHER): Payer: BC Managed Care – PPO | Admitting: Cardiovascular Disease

## 2011-01-12 DIAGNOSIS — I498 Other specified cardiac arrhythmias: Secondary | ICD-10-CM

## 2011-01-12 DIAGNOSIS — I10 Essential (primary) hypertension: Secondary | ICD-10-CM

## 2011-01-12 DIAGNOSIS — Z951 Presence of aortocoronary bypass graft: Secondary | ICD-10-CM

## 2011-01-12 DIAGNOSIS — R001 Bradycardia, unspecified: Secondary | ICD-10-CM

## 2011-01-12 DIAGNOSIS — Z9889 Other specified postprocedural states: Secondary | ICD-10-CM

## 2011-01-12 DIAGNOSIS — I2510 Atherosclerotic heart disease of native coronary artery without angina pectoris: Secondary | ICD-10-CM

## 2011-01-12 DIAGNOSIS — E7800 Pure hypercholesterolemia, unspecified: Secondary | ICD-10-CM

## 2011-01-12 NOTE — Progress Notes (Signed)
   Patient ID: Zachary Hall, male    DOB: October 01, 1934, 76 y.o.   MRN: [id number]  HPI Comments: This pleasant 76 year old gentleman with history of coronary artery disease, bypass surgery at [facility] in July 2011 with postoperative atrial fibrillation, also with hypertension with recent admission to the hospital with a pounding headache Like he had significant pressure in his forehead and bradycardia. Cardiology was consulted for his bradycardia.  Heart rates were noted to be in the 40s. He reported that symptoms started at 9 PM while he was 20 to sleep. He had palpitations, diffuse sweating over his face. He got up from bed and felt very dizzy. He had pounding in the temple region of his forehead. His wife drove him to the hospital. No recent changes in his medications. He's otherwise been active and has felt well.  His metoprolol was held and he was monitored overnight on telemetry. His rate seemed to improve by the next morning and he was ambulating without any significant symptoms. He was discharged and instructed to hold his metoprolol.  He presents today and has a list of his blood pressure and heart rate numbers. His heart rate is typically in the 50-60 range though he does have 2 heart rates in the 40s in the past week. Today heart rate is 46 on EKG. Blood pressure has been well controlled in the 130s to 120 range. He has been asymptomatic.  Cholesterol in the hospital dated June 7 was 151, LDL 60, HDL 50, triglycerides 204  EKG today shows sinus bradycardia with rate 46 beats per minute, left axis deviation otherwise essentially normal EKG       Review of Systems  Constitutional: Negative.   HENT: Negative.   Eyes: Negative.   Respiratory: Negative.   Cardiovascular: Negative.   Gastrointestinal: Negative.   Musculoskeletal: Negative.   Skin: Negative.   Neurological: Negative.   Hematological: Negative.   Psychiatric/Behavioral: Negative.   All other systems reviewed and are  negative.    BP 127/76  Pulse 46  Ht 5\' 11"  (1.803 m)  Wt 189 lb (85.73 kg)  BMI 26.36 kg/m2  Physical Exam  Nursing note and vitals reviewed. Constitutional: He is oriented to person, place, and time. He appears well-developed and well-nourished.  HENT:  Head: Normocephalic.  Nose: Nose normal.  Mouth/Throat: Oropharynx is clear and moist.  Eyes: Conjunctivae are normal. Pupils are equal, round, and reactive to light.  Neck: Normal range of motion. Neck supple. No JVD present.  Cardiovascular: Normal rate, regular rhythm, S1 normal, S2 normal, normal heart sounds and intact distal pulses.  Exam reveals no gallop and no friction rub.   No murmur heard. Pulmonary/Chest: Effort normal and breath sounds normal. No respiratory distress. He has no wheezes. He has no rales. He exhibits no tenderness.  Abdominal: Soft. Bowel sounds are normal. He exhibits no distension. There is no tenderness.  Musculoskeletal: Normal range of motion. He exhibits no edema and no tenderness.  Lymphadenopathy:    He has no cervical adenopathy.  Neurological: He is alert and oriented to person, place, and time. Coordination normal.  Skin: Skin is warm and dry. No rash noted. No erythema.  Psychiatric: He has a normal mood and affect. His behavior is normal. Judgment and thought content normal.           Assessment and Plan

## 2011-01-12 NOTE — Assessment & Plan Note (Signed)
Currently with no symptoms of angina. No further workup at this time. Continue current medication regimen. 

## 2011-01-12 NOTE — Assessment & Plan Note (Signed)
Blood pressure is well controlled on today's visit. No changes made to the medications. 

## 2011-01-12 NOTE — Assessment & Plan Note (Signed)
Cholesterol is at goal on the current lipid regimen. No changes to the medications were made.  

## 2011-01-12 NOTE — Patient Instructions (Signed)
You are doing well. No medication changes were made. Please call us if you have new issues that need to be addressed before your next appt.  We will call you for a follow up Appt. In 6 months  

## 2011-01-12 NOTE — Assessment & Plan Note (Signed)
Recent admission to the hospital for discomfort in his head that resolved after admission overnight, metoprolol held for bradycardia. It is uncertain if his dizzy episodes the day of admission with these had symptoms and sweating was secondary to worsening bradycardia or something else. Workup in the hospital was essentially normal and no significant arrhythmia apart from bradycardia on telemetry. Today his heart rate is in the mid 40s and he is asymptomatic. We will continue to hold his metoprolol.

## 2011-01-27 ENCOUNTER — Encounter: Payer: Self-pay | Admitting: Cardiovascular Disease

## 2011-02-07 ENCOUNTER — Other Ambulatory Visit: Payer: Self-pay | Admitting: *Deleted

## 2011-02-07 MED ORDER — AMLODIPINE BESYLATE 10 MG PO TABS: 10.0000 mg | ORAL_TABLET | Freq: Every day | ORAL | Status: DC

## 2011-02-07 NOTE — Telephone Encounter (Signed)
Fax received from pharmacy. Refill completed. Jodette Frazer [place] RN  

## 2011-03-15 ENCOUNTER — Ambulatory Visit (INDEPENDENT_AMBULATORY_CARE_PROVIDER_SITE_OTHER): Payer: BC Managed Care – PPO | Admitting: Cardiology

## 2011-03-15 ENCOUNTER — Encounter: Payer: Self-pay | Admitting: Cardiology

## 2011-03-15 VITALS — BP 124/62 | HR 43 | Wt 170.0 lb

## 2011-03-15 DIAGNOSIS — I119 Hypertensive heart disease without heart failure: Secondary | ICD-10-CM

## 2011-03-15 DIAGNOSIS — I498 Other specified cardiac arrhythmias: Secondary | ICD-10-CM

## 2011-03-15 DIAGNOSIS — Z8679 Personal history of other diseases of the circulatory system: Secondary | ICD-10-CM

## 2011-03-15 DIAGNOSIS — I10 Essential (primary) hypertension: Secondary | ICD-10-CM

## 2011-03-15 DIAGNOSIS — R001 Bradycardia, unspecified: Secondary | ICD-10-CM

## 2011-03-15 NOTE — Progress Notes (Signed)
Zachary Hall Date of Birth:  [birth year] [facility] [address]   [address] [place], [place]  [address] [phone]           Fax   [phone]  History of Present Illness: This pleasant 76 year old gentleman is seen for a scheduled followup office visit.  He has a history of ischemic heart disease.  He had a strongly positive treadmill stress test in 2011 which led to cardiac catheterization by Dr. [place].  He went on to have coronary artery bypass graft surgery by Dr. Laneta Simmers on 02/07/10.  Postoperatively he did have some paroxysmal atrial fibrillation which resolved without specific therapy.  He finished the cardiac rehabilitation program at [facility].  He does have a history of high blood pressure which has been asymptomatic.  He's been monitoring his blood pressures at home.  Recently his pressures have been doing better.  He has been experiencing some easy bruising.  He is not sure what his present dose of aspirin is.  He has a history of marked sinus bradycardia but has not been having any symptoms of dizziness or syncope related to his bradycardia.  Current Outpatient Prescriptions  Medication Sig Dispense Refill  . amLODipine (NORVASC) 10 MG tablet Take 1 tablet (10 mg total) by mouth daily.  30 tablet  5  . aspirin 325 MG tablet Take 325 mg by mouth daily.        [name] atorvastatin (LIPITOR) 10 MG tablet Take 10 mg by mouth daily.        . Cetirizine HCl (ZYRTEC PO) Take 1 tablet by mouth 1 dose over 24 hours.       [name] CHERRY PO Take 1 capsule by mouth daily. ** Black Cherry **       . Fluticasone-Salmeterol (ADVAIR DISKUS) 250-50 MCG/DOSE AEPB Inhale 1 puff into the lungs every 12 (twelve) hours. As needed      . hydrochlorothiazide (HYDRODIURIL) 12.5 MG tablet Take 12.5 mg by mouth daily.        [name] lisinopril (PRINIVIL,ZESTRIL) 20 MG tablet Take 1 tablet (20 mg total) by mouth 2 (two) times daily.  60 tablet  11  . multivitamin (THERAGRAN) per  tablet Take 1 tablet by mouth daily.        . nitroGLYCERIN (NITROSTAT) 0.4 MG SL tablet Place 1 tablet (0.4 mg total) under the tongue every 5 (five) minutes as needed.  25 tablet  3  . Omega-3 Fatty Acids (OMEGA 3 PO) Take 1 capsule by mouth daily.          No Known Allergies  Patient Active Problem List  Diagnoses  . Hypertension  . Hypercholesterolemia  . Coronary artery disease  . History of atrial fibrillation  . History of chest pain  . Arm pain  . Bradycardia  . Hx of CABG    History  Smoking status  . Former Smoker  . Types: Cigarettes  . Quit date: 08/01/1959  Smokeless tobacco  . Never Used    History  Alcohol Use No    Family History  Problem Relation Age of Onset  . Alcohol abuse Father   . Colon cancer Mother     Review of Systems: Constitutional: no fever chills diaphoresis or fatigue or change in weight.  Head and neck: no hearing loss, no epistaxis, no photophobia or visual disturbance. Respiratory: No cough, shortness of breath or wheezing. Cardiovascular: No chest pain peripheral edema, palpitations. Gastrointestinal: No abdominal distention, no abdominal pain,  no change in bowel habits hematochezia or melena. Genitourinary: No dysuria, no frequency, no urgency, no nocturia. Musculoskeletal:No arthralgias, no back pain, no gait disturbance or myalgias. Neurological: No dizziness, no headaches, no numbness, no seizures, no syncope, no weakness, no tremors. Hematologic: No lymphadenopathy, no easy bruising. Psychiatric: No confusion, no hallucinations, no sleep disturbance.    Physical Exam: Filed Vitals:   03/15/11 0915  BP: 124/62  Pulse: 43  The general appearance feels a well-developed well-nourished gentleman in no distress.The head and neck exam reveals pupils equal and reactive.  Extraocular movements are full.  There is no scleral icterus.  The mouth and pharynx are normal.  The neck is supple.  The carotids reveal no bruits.  The  jugular venous pressure is normal.  The  thyroid is not enlarged.  There is no lymphadenopathy.  The chest is clear to percussion and auscultation.  There are no rales or rhonchi.  Expansion of the chest is symmetrical.  The precordium is quiet.  The first heart sound is normal.  The second heart sound is physiologically split.  There is no murmur gallop rub or click.  There is no abnormal lift or heave.  The abdomen is soft and nontender.  The bowel sounds are normal.  The liver and spleen are not enlarged.  There are no abdominal masses.  There are no abdominal bruits.  Extremities reveal good pedal pulses.  There is no phlebitis or edema.  There is no cyanosis or clubbing.  Strength is normal and symmetrical in all extremities.  There is no lateralizing weakness.  There are no sensory deficits.  The skin is warm and dry.  There is no rash.  EKG today shows marked sinus bradycardia at 43 per minute.  His PR interval is normal.  There are no ischemic changes.   Assessment / Plan: Continue same medications.  Recheck in 6 months for followup office visit.  Because of his problems with easy bruising he will aspirin to half the present dose.

## 2011-03-15 NOTE — Assessment & Plan Note (Signed)
His recent blood pressures have been satisfactory at home.  He's not having any dizziness or syncope or headaches.

## 2011-03-15 NOTE — Assessment & Plan Note (Signed)
The patient has not been expressing any recurrent atrial fibrillation.

## 2011-03-15 NOTE — Assessment & Plan Note (Signed)
The patient has significant marked sinus bradycardia.  His heart rate today is 43 off Lopressor.  His not having any dizziness or syncope or headache

## 2011-06-08 ENCOUNTER — Telehealth: Payer: Self-pay | Admitting: Cardiology

## 2011-06-08 NOTE — Telephone Encounter (Signed)
Called stating he has had a couple of nose bleeds; today and a couple of days ago. Also has been having some rectal bleeding. Is not constipated. Has been told he has external hemorrhoids. States this morning had "alot" of bleeding, not painful. He does have GI doctor at [facility]. Advised that he could stop ASA for today and tomorrow. Will let [facility] and [name] be aware. Would like to know what else to do. Also advised to try NS nasal spray.

## 2011-06-08 NOTE — Telephone Encounter (Signed)
Pt is having bleeding from his nose and rectum for a while.  They think he needs to lower the ASA dose. Pt has lowered the dose to 81 mg about 2 weeks ago but pt is still bleeding.  Please call and advise what he should do. GI doctor is not aware of this. The nose bleed is something new.  [phone]

## 2011-06-09 NOTE — Telephone Encounter (Signed)
Hold aspirin for 2 days and then switch to 81 mg aspirin daily.

## 2011-06-09 NOTE — Telephone Encounter (Signed)
Advised patient. Will call back if continues to have problems

## 2011-06-21 ENCOUNTER — Telehealth: Payer: Self-pay | Admitting: Cardiology

## 2011-06-21 DIAGNOSIS — Z951 Presence of aortocoronary bypass graft: Secondary | ICD-10-CM

## 2011-06-21 MED ORDER — LISINOPRIL 20 MG PO TABS: 20.0000 mg | ORAL_TABLET | Freq: Two times a day (BID) | ORAL | Status: DC

## 2011-06-21 NOTE — Telephone Encounter (Signed)
Refilled lisinopril

## 2011-06-21 NOTE — Telephone Encounter (Signed)
Lisinopril 20mg  was increased from 1 a day to two a day, so ran out early, uses [facility], 90 days supply, [place], needs asap

## 2011-07-01 ENCOUNTER — Other Ambulatory Visit: Payer: Self-pay | Admitting: Cardiology

## 2011-08-16 ENCOUNTER — Other Ambulatory Visit: Payer: Self-pay | Admitting: *Deleted

## 2011-08-16 MED ORDER — AMLODIPINE BESYLATE 10 MG PO TABS: 10.0000 mg | ORAL_TABLET | Freq: Every day | ORAL | Status: DC

## 2011-08-16 NOTE — Telephone Encounter (Signed)
Refilled amlodipine one time ,needs appointment 

## 2011-09-18 ENCOUNTER — Ambulatory Visit (INDEPENDENT_AMBULATORY_CARE_PROVIDER_SITE_OTHER): Payer: BC Managed Care – PPO | Admitting: Cardiology

## 2011-09-18 ENCOUNTER — Encounter: Payer: Self-pay | Admitting: Cardiology

## 2011-09-18 VITALS — BP 126/78 | HR 60 | Ht 71.0 in | Wt 197.0 lb

## 2011-09-18 DIAGNOSIS — E7800 Pure hypercholesterolemia, unspecified: Secondary | ICD-10-CM

## 2011-09-18 DIAGNOSIS — Z951 Presence of aortocoronary bypass graft: Secondary | ICD-10-CM

## 2011-09-18 DIAGNOSIS — I10 Essential (primary) hypertension: Secondary | ICD-10-CM

## 2011-09-18 DIAGNOSIS — I119 Hypertensive heart disease without heart failure: Secondary | ICD-10-CM

## 2011-09-18 NOTE — Patient Instructions (Signed)
Your physician recommends that you continue on your current medications as directed. Please refer to the Current Medication list given to you today.  Your physician wants you to follow-up in: 6 months. You will receive a reminder letter in the mail two months in advance. If you don't receive a letter, please call our office to schedule the follow-up appointment.  

## 2011-09-18 NOTE — Assessment & Plan Note (Signed)
His blood pressure has been remaining stable on current therapy.  He's not having any dizziness or syncope or palpitations.

## 2011-09-18 NOTE — Progress Notes (Signed)
Zachary Hall Date of Birth:  1935/02/17 [facility] [address] [place], [place]  [address] [phone]         Fax   [phone]  History of Present Illness: This pleasant 77 year old gentleman is seen for a scheduled 6 month followup office visit.  He has a history of known ischemic heart disease.  He underwent coronary artery bypass graft surgery on 02/07/10 by Dr. Laneta Simmers.  He has been doing well.  He is having no recurrent angina.  He had been having a problem with epistaxis which has improved since he cut back on his aspirin to just 81 mg every other day.  He has also cut back on his use of nasal steroids.  He has not been expressing any racing of his heart.  Current Outpatient Prescriptions  Medication Sig Dispense Refill  . amLODipine (NORVASC) 10 MG tablet Take 1 tablet (10 mg total) by mouth daily.  30 tablet  0  . aspirin 325 MG tablet Take 81 mg by mouth daily. Taking every other day      . atorvastatin (LIPITOR) 10 MG tablet Take 10 mg by mouth daily.        . Cetirizine HCl (ZYRTEC PO) Take 1 tablet by mouth 1 dose over 24 hours.       [name] CHERRY PO Take 1 capsule by mouth daily. ** Black Cherry **       . Fluticasone-Salmeterol (ADVAIR DISKUS) 250-50 MCG/DOSE AEPB Inhale 1 puff into the lungs every 12 (twelve) hours. As needed      . hydrochlorothiazide (HYDRODIURIL) 12.5 MG tablet Take 12.5 mg by mouth daily.        [name] lisinopril (PRINIVIL,ZESTRIL) 20 MG tablet Take 1 tablet (20 mg total) by mouth 2 (two) times daily.  180 tablet  3  . multivitamin (THERAGRAN) per tablet Take 1 tablet by mouth daily.        . nitroGLYCERIN (NITROSTAT) 0.4 MG SL tablet Place 1 tablet (0.4 mg total) under the tongue every 5 (five) minutes as needed.  25 tablet  3  . Omega-3 Fatty Acids (OMEGA 3 PO) Take 1 capsule by mouth daily.          No Known Allergies  Patient Active Problem List  Diagnoses  . Hypertension  . Hypercholesterolemia  . Coronary artery  disease  . History of atrial fibrillation  . History of chest pain  . Arm pain  . Bradycardia  . Hx of CABG    History  Smoking status  . Former Smoker  . Types: Cigarettes  . Quit date: 08/01/1959  Smokeless tobacco  . Never Used    History  Alcohol Use No    Family History  Problem Relation Age of Onset  . Alcohol abuse Father   . Colon cancer Mother     Review of Systems: Constitutional: no fever chills diaphoresis or fatigue or change in weight.  Head and neck: no hearing loss, no epistaxis, no photophobia or visual disturbance. Respiratory: No cough, shortness of breath or wheezing. Cardiovascular: No chest pain peripheral edema, palpitations. Gastrointestinal: No abdominal distention, no abdominal pain, no change in bowel habits hematochezia or melena. Genitourinary: No dysuria, no frequency, no urgency, no nocturia. Musculoskeletal:No arthralgias, no back pain, no gait disturbance or myalgias. Neurological: No dizziness, no headaches, no numbness, no seizures, no syncope, no weakness, no tremors. Hematologic: No lymphadenopathy, no easy bruising. Psychiatric: No confusion, no hallucinations, no sleep disturbance.    Physical Exam:  Filed Vitals:   09/18/11 1141  BP: 126/78  Pulse: 60   The general appearance reveals a well-developed well-nourished gentleman in no distress.The head and neck exam reveals pupils equal and reactive.  Extraocular movements are full.  There is no scleral icterus.  The mouth and pharynx are normal.  The neck is supple.  The carotids reveal no bruits.  The jugular venous pressure is normal.  The  thyroid is not enlarged.  There is no lymphadenopathy.  The chest is clear to percussion and auscultation.  There are no rales or rhonchi.  Expansion of the chest is symmetrical.  The precordium is quiet.  The first heart sound is normal.  The second heart sound is physiologically split.  There is no murmur gallop rub or click.  There is no  abnormal lift or heave.  The abdomen is soft and nontender.  The bowel sounds are normal.  The liver and spleen are not enlarged.  There are no abdominal masses.  There are no abdominal bruits.  Extremities reveal good pedal pulses.  There is no phlebitis or edema.  There is no cyanosis or clubbing.  Strength is normal and symmetrical in all extremities.  There is no lateralizing weakness.  There are no sensory deficits.  The skin is warm and dry.  There is no rash.    Assessment / Plan: Continue present medication.  Continue regular exercise.  Strict diet.  Recheck in 6 months for followup office visit and fasting lipid panel, hepatic function panel, and basal metabolic panel.

## 2011-09-18 NOTE — Assessment & Plan Note (Signed)
Is on low-dose Lipitor 10 mg daily.  He is not having any myalgias.  We will plan to check fasting lab work at his next visit.

## 2011-09-18 NOTE — Assessment & Plan Note (Signed)
The patient has had no recurrent angina pectoris.  He does some walking exercise and also does a lot of yard work and home projects.

## 2011-12-24 ENCOUNTER — Other Ambulatory Visit: Payer: Self-pay | Admitting: Cardiology

## 2011-12-28 ENCOUNTER — Other Ambulatory Visit: Payer: Self-pay | Admitting: Cardiology

## 2011-12-28 MED ORDER — HYDROCHLOROTHIAZIDE 12.5 MG PO CAPS: 12.5000 mg | ORAL_CAPSULE | Freq: Every day | ORAL | Status: DC

## 2012-03-14 ENCOUNTER — Other Ambulatory Visit (INDEPENDENT_AMBULATORY_CARE_PROVIDER_SITE_OTHER): Payer: BC Managed Care – PPO

## 2012-03-14 DIAGNOSIS — I119 Hypertensive heart disease without heart failure: Secondary | ICD-10-CM

## 2012-03-14 LAB — LIPID PANEL
HDL: 57.5 mg/dL (ref >39.00)
LDL Cholesterol: 105 mg/dL — ABNORMAL HIGH (ref 0–99)
VLDL: 30.8 mg/dL (ref 0.0–40.0)

## 2012-03-14 LAB — BASIC METABOLIC PANEL
Chloride: 98 mEq/L (ref 96–112)
GFR: 87.31 mL/min (ref >60.00)
Glucose, Bld: 92 mg/dL (ref 70–99)
Potassium: 4.3 mEq/L (ref 3.5–5.1)
Sodium: 134 mEq/L — ABNORMAL LOW (ref 135–145)

## 2012-03-14 LAB — HEPATIC FUNCTION PANEL
ALT: 14 U/L (ref 0–53)
Total Bilirubin: 0.5 mg/dL (ref 0.3–1.2)
Total Protein: 6.8 g/dL (ref 6.0–8.3)

## 2012-03-14 NOTE — Progress Notes (Signed)
Quick Note:  Please make copy of labs for patient visit. ______ 

## 2012-03-26 ENCOUNTER — Ambulatory Visit (INDEPENDENT_AMBULATORY_CARE_PROVIDER_SITE_OTHER): Payer: BC Managed Care – PPO | Admitting: Cardiology

## 2012-03-26 ENCOUNTER — Encounter: Payer: Self-pay | Admitting: Cardiology

## 2012-03-26 VITALS — BP 126/72 | HR 61 | Ht 71.0 in | Wt 195.8 lb

## 2012-03-26 DIAGNOSIS — E7800 Pure hypercholesterolemia, unspecified: Secondary | ICD-10-CM

## 2012-03-26 DIAGNOSIS — Z951 Presence of aortocoronary bypass graft: Secondary | ICD-10-CM

## 2012-03-26 DIAGNOSIS — I10 Essential (primary) hypertension: Secondary | ICD-10-CM

## 2012-03-26 MED ORDER — LISINOPRIL 10 MG PO TABS: 10.0000 mg | ORAL_TABLET | Freq: Two times a day (BID) | ORAL | Status: DC

## 2012-03-26 MED ORDER — NITROGLYCERIN 0.4 MG SL SUBL: 0.4000 mg | SUBLINGUAL_TABLET | SUBLINGUAL | Status: DC | PRN

## 2012-03-26 NOTE — Assessment & Plan Note (Signed)
The patient is on low-dose atorvastatin for his cholesterolemia.  Recent lab work is satisfactory.  He is not having any side effects or myalgias from the Lipitor

## 2012-03-26 NOTE — Assessment & Plan Note (Signed)
The patient has been experiencing some episodes of dizziness and lightheadedness which she attributes to the high-dose lisinopril.  This will be decreased to just 10 mg lisinopril twice a day

## 2012-03-26 NOTE — Assessment & Plan Note (Signed)
Patient stays physically active.  He does a lot of yard work.  He plans to start using his home treadmill again as well.  We refilled his nitroglycerin today.  He carries them but has not had to use them.

## 2012-03-26 NOTE — Progress Notes (Signed)
Zachary Hall Date of Birth:  02-Jan-1935 [facility] [address] [place], [place]  [address] [phone]         Fax   [phone]  History of Present Illness: This pleasant 77 year old gentleman is seen for a scheduled 6 month followup office visit. He has a history of known ischemic heart disease. He underwent coronary artery bypass graft surgery on 02/07/10 by Dr. Laneta Simmers. He has been doing well. He is having no recurrent angina. He had been having a problem with epistaxis which has improved since he cut back on his aspirin to just 81 mg every other day. He has also cut back on his use of nasal steroids. He has not been expressing any racing of his heart.  He has been having some problems with dizziness and on his own has cut back on his lisinopril.  Previously he had been taking 20 mg twice a day.   Current Outpatient Prescriptions  Medication Sig Dispense Refill  . amLODipine (NORVASC) 10 MG tablet Take 1 tablet (10 mg total) by mouth daily.  30 tablet  0  . aspirin 81 MG tablet Take 81 mg by mouth. Every Other Day      . atorvastatin (LIPITOR) 10 MG tablet Take 10 mg by mouth daily.        Jennette Banker Sodium 30-100 MG CAPS Take by mouth as needed. Stool Softner      . Cetirizine HCl (ZYRTEC PO) Take 10 mg by mouth daily.       [name] CHERRY PO Take 1 capsule by mouth daily. ** Black Cherry **       . Fluticasone-Salmeterol (ADVAIR DISKUS) 250-50 MCG/DOSE AEPB Inhale 1 puff into the lungs every 12 (twelve) hours. As needed      . hydrochlorothiazide (MICROZIDE) 12.5 MG capsule Take 1 capsule (12.5 mg total) by mouth daily.  30 capsule  5  . lisinopril (PRINIVIL,ZESTRIL) 10 MG tablet Take 1 tablet (10 mg total) by mouth 2 (two) times daily.  180 tablet  3  . multivitamin (THERAGRAN) per tablet Take 1 tablet by mouth daily.        . nitroGLYCERIN (NITROSTAT) 0.4 MG SL tablet Place 1 tablet (0.4 mg total) under the tongue every 5 (five) minutes as  needed.  25 tablet  3  . Omega-3 Fatty Acids (OMEGA 3 PO) Take 1 capsule by mouth daily.        [name] DISCONTD: lisinopril (PRINIVIL,ZESTRIL) 20 MG tablet Take 1 tablet (20 mg total) by mouth 2 (two) times daily.  180 tablet  3  . DISCONTD: nitroGLYCERIN (NITROSTAT) 0.4 MG SL tablet Place 1 tablet (0.4 mg total) under the tongue every 5 (five) minutes as needed.  25 tablet  3    No Known Allergies  Patient Active Problem List  Diagnosis  . Hypertension  . Hypercholesterolemia  . Coronary artery disease  . History of atrial fibrillation  . History of chest pain  . Arm pain  . Bradycardia  . Hx of CABG    History  Smoking status  . Former Smoker  . Types: Cigarettes  . Quit date: 08/01/1959  Smokeless tobacco  . Never Used    History  Alcohol Use No    Family History  Problem Relation Age of Onset  . Alcohol abuse Father   . Colon cancer Mother     Review of Systems: Constitutional: no fever chills diaphoresis or fatigue or change in weight.  Head and neck: no  hearing loss, no epistaxis, no photophobia or visual disturbance. Respiratory: No cough, shortness of breath or wheezing. Cardiovascular: No chest pain peripheral edema, palpitations. Gastrointestinal: No abdominal distention, no abdominal pain, no change in bowel habits hematochezia or melena. Genitourinary: No dysuria, no frequency, no urgency, no nocturia. Musculoskeletal:No arthralgias, no back pain, no gait disturbance or myalgias. Neurological: No dizziness, no headaches, no numbness, no seizures, no syncope, no weakness, no tremors. Hematologic: No lymphadenopathy, no easy bruising. Psychiatric: No confusion, no hallucinations, no sleep disturbance.    Physical Exam: Filed Vitals:   03/26/12 1341  BP: 126/72  Pulse: 61   the general appearance reveals a well-developed well-nourished elderly gentleman in no distress.The head and neck exam reveals pupils equal and reactive.  Extraocular movements are  full.  There is no scleral icterus.  The mouth and pharynx are normal.  The neck is supple.  The carotids reveal no bruits.  The jugular venous pressure is normal.  The  thyroid is not enlarged.  There is no lymphadenopathy.  The chest is clear to percussion and auscultation.  There are no rales or rhonchi.  Expansion of the chest is symmetrical.  The precordium is quiet.  The first heart sound is normal.  The second heart sound is physiologically split.  There is no murmur gallop rub or click.  There is no abnormal lift or heave.  The abdomen is soft and nontender.  The bowel sounds are normal.  The liver and spleen are not enlarged.  There are no abdominal masses.  There are no abdominal bruits.  Extremities reveal good pedal pulses.  There is no phlebitis or edema.  There is no cyanosis or clubbing.  Strength is normal and symmetrical in all extremities.  There is no lateralizing weakness.  There are no sensory deficits.  The skin is warm and dry.  There is no rash.     Assessment / Plan: Continue on same medication.  Recheck in 4 months for followup office visit lipid panel hepatic function panel and basal metabolic panel.  Decreased lisinopril has noted.  We also gave him a prescription to get the shingles shot.

## 2012-03-26 NOTE — Patient Instructions (Addendum)
DECREASE YOUR LISINOPRIL TO 10 MG TWICE A DAY  Your physician recommends that you schedule a follow-up appointment in: 4 months with fasting labs (lp/bmet/hfp)

## 2012-04-26 ENCOUNTER — Other Ambulatory Visit: Payer: Self-pay | Admitting: Cardiology

## 2012-06-14 ENCOUNTER — Other Ambulatory Visit: Payer: Self-pay | Admitting: *Deleted

## 2012-06-14 DIAGNOSIS — Z951 Presence of aortocoronary bypass graft: Secondary | ICD-10-CM

## 2012-06-14 MED ORDER — LISINOPRIL 10 MG PO TABS: 10.0000 mg | ORAL_TABLET | Freq: Two times a day (BID) | ORAL | Status: DC

## 2012-07-08 ENCOUNTER — Ambulatory Visit (INDEPENDENT_AMBULATORY_CARE_PROVIDER_SITE_OTHER): Payer: BC Managed Care – PPO | Admitting: Surgery

## 2012-07-08 ENCOUNTER — Encounter (INDEPENDENT_AMBULATORY_CARE_PROVIDER_SITE_OTHER): Payer: Self-pay | Admitting: Surgery

## 2012-07-08 VITALS — BP 132/68 | HR 64 | Temp 97.6°F | Resp 16 | Ht 71.0 in | Wt 194.6 lb

## 2012-07-08 DIAGNOSIS — K648 Other hemorrhoids: Secondary | ICD-10-CM | POA: Insufficient documentation

## 2012-07-08 MED ORDER — HYDROCORTISONE ACETATE 25 MG RE SUPP: 25.0000 mg | Freq: Two times a day (BID) | RECTAL | Status: DC

## 2012-07-08 NOTE — Patient Instructions (Signed)
ANORECTAL PROCEDURES: 1.  Tub soaks 2-3 times daily in warm water (may add Epsom salts if desired) 2.  Stool softener for one month (store brand Miralax or Colace) 3.  Avoid toilet paper - use baby wipes or Tucks pads 4.  Increase water intake - 6-8 glasses daily 5.  Apply dry pad to area until drainage stops 

## 2012-07-08 NOTE — Progress Notes (Signed)
General Surgery [facility]  Chief Complaint  Patient presents with  . New Evaluation    eval hems - patient is self-referred    HISTORY: Patient is a 77 year old white male who is self-referred for management of bleeding internal hemorrhoids. Patient has a history of hemorrhoidectomy in 1970 and a second hemorrhoidectomy performed in 1990. Patient continues to have intermittent problems with prolapse and bleeding. He denies any significant pain. He does have regular colonoscopy and is due for an examination in 2 weeks. Patient presents today for evaluation.  Past Medical History  Diagnosis Date  . Hypertension   . Hypercholesterolemia   . Coronary artery disease   . Nocturia   . History of atrial fibrillation     Previously on amiodarone  . Bradycardia   . Hx of CABG   . Gout   . Myocardial infarction      Current Outpatient Prescriptions  Medication Sig Dispense Refill  . amLODipine (NORVASC) 10 MG tablet TAKE 1 TABLET BY MOUTH EVERY DAY  30 tablet  6  . aspirin 81 MG tablet Take 81 mg by mouth. Every Other Day      . atorvastatin (LIPITOR) 10 MG tablet Take 10 mg by mouth daily.        Jennette Banker Sodium 30-100 MG CAPS Take by mouth as needed. Stool Softner      . Cetirizine HCl (ZYRTEC PO) Take 10 mg by mouth daily.       [name] CHERRY PO Take 1 capsule by mouth daily. ** Black Cherry **       . Fluticasone-Salmeterol (ADVAIR DISKUS) 250-50 MCG/DOSE AEPB Inhale 1 puff into the lungs every 12 (twelve) hours. As needed      . hydrochlorothiazide (MICROZIDE) 12.5 MG capsule Take 1 capsule (12.5 mg total) by mouth daily.  30 capsule  5  . lisinopril (PRINIVIL,ZESTRIL) 10 MG tablet Take 1 tablet (10 mg total) by mouth 2 (two) times daily.  180 tablet  3  . multivitamin (THERAGRAN) per tablet Take 1 tablet by mouth daily.        . nitroGLYCERIN (NITROSTAT) 0.4 MG SL tablet Place 1 tablet (0.4 mg total) under the tongue every 5 (five) minutes as needed.   25 tablet  3  . Omega-3 Fatty Acids (OMEGA 3 PO) Take 1 capsule by mouth daily.        . hydrocortisone (ANUSOL-HC) 25 MG suppository Place 1 suppository (25 mg total) rectally 2 (two) times daily.  20 suppository  1     No Known Allergies   Family History  Problem Relation Age of Onset  . Alcohol abuse Father   . Colon cancer Mother   . Cancer Mother     colon and bone     History   Social History  . Marital Status: Married    Spouse Name: N/A    Number of Children: N/A  . Years of Education: N/A   Social History Main Topics  . Smoking status: Former Smoker    Types: Cigarettes    Quit date: 08/01/1959  . Smokeless tobacco: Never Used  . Alcohol Use: No  . Drug Use: No  . Sexually Active: None   Other Topics Concern  . None   Social History Narrative  . None     REVIEW OF SYSTEMS - PERTINENT POSITIVES ONLY: Intermittent swelling at the anus with bleeding. Denies bleeding with bowel movements. Denies significant pain.  EXAMCeasar Mons Vitals:   07/08/12 1610  BP: 132/68  Pulse: 64  Temp: 97.6 F (36.4 C)  Resp: 16    HEENT: normocephalic; pupils equal and reactive; sclerae clear; dentition good; mucous membranes moist NECK:  symmetric on extension; no palpable anterior or posterior cervical lymphadenopathy; no supraclavicular masses; no tenderness CHEST: clear to auscultation bilaterally without rales, rhonchi, or wheezes CARDIAC: regular rate and rhythm without significant murmur; peripheral pulses are full GU:  External examination shows multiple benign soft anal skin tags; no significant external hemorrhoidal veins; no sign of thrombosis; no sign of fissure; no sign of fistula; digital exam shows normal tone with slight laxity and slight edema EXT:  non-tender without edema; no deformity NEURO: no gross focal deficits; no sign of tremor  PROCEDURE: Anoscopy is performed. A 360 view of the lower rectum shows grade 2 internal hemorrhoids with slight  prolapse. In the left posterior column is an area of ulceration with a small amount of adherent thrombus. This is likely the site of recent bleeding. Rubber band ligation is performed at this site without undue discomfort. The patient tolerated the procedure well.  LABORATORY RESULTS: See [facility] (CHL-Epic) for most recent results   RADIOLOGY RESULTS: See [facility] (CHL-Epic) for most recent results   IMPRESSION: Grade 2 internal hemorrhoids, intermittent prolapse, intermittent bleeding  PLAN: Patient is given the usual instructions following rubber band ligation. He does have multiple columns of hemorrhoidal disease. I believe he can be safely managed for further band ligation here in the office. Failing this, he may be a candidate for the [facility] procedure. Patient will begin using Anusol HC suppositories for 10 days. He will return in 4 weeks for repeat endoscopy and possible further rubber band ligations as tolerated.  Velora Heckler, MD, FACS General & Endocrine Surgery [facility]   Visit Diagnoses: 1. Hemorrhoids, internal, with bleeding     Primary Care Physician: Bosie Clos, MD

## 2012-07-18 ENCOUNTER — Other Ambulatory Visit (INDEPENDENT_AMBULATORY_CARE_PROVIDER_SITE_OTHER): Payer: BC Managed Care – PPO

## 2012-07-18 ENCOUNTER — Other Ambulatory Visit: Payer: Self-pay | Admitting: *Deleted

## 2012-07-18 DIAGNOSIS — E7800 Pure hypercholesterolemia, unspecified: Secondary | ICD-10-CM

## 2012-07-18 DIAGNOSIS — I10 Essential (primary) hypertension: Secondary | ICD-10-CM

## 2012-07-18 DIAGNOSIS — I2510 Atherosclerotic heart disease of native coronary artery without angina pectoris: Secondary | ICD-10-CM

## 2012-07-18 LAB — HEPATIC FUNCTION PANEL
AST: 16 U/L (ref 0–37)
Albumin: 4 g/dL (ref 3.5–5.2)
Alkaline Phosphatase: 50 U/L (ref 39–117)
Bilirubin, Direct: 0 mg/dL (ref 0.0–0.3)
Total Protein: 6.8 g/dL (ref 6.0–8.3)

## 2012-07-18 LAB — BASIC METABOLIC PANEL
Calcium: 8.7 mg/dL (ref 8.4–10.5)
Creatinine, Ser: 1.1 mg/dL (ref 0.4–1.5)

## 2012-07-18 LAB — LIPID PANEL
Cholesterol: 141 mg/dL (ref 0–200)
Triglycerides: 122 mg/dL (ref 0.0–149.0)

## 2012-07-18 NOTE — Progress Notes (Signed)
Quick Note:  Please make copy of labs for patient visit. ______ 

## 2012-07-29 ENCOUNTER — Encounter: Payer: Self-pay | Admitting: Cardiology

## 2012-07-29 ENCOUNTER — Ambulatory Visit (INDEPENDENT_AMBULATORY_CARE_PROVIDER_SITE_OTHER): Payer: BC Managed Care – PPO | Admitting: Cardiology

## 2012-07-29 VITALS — BP 138/82 | HR 60 | Resp 18 | Ht 71.0 in | Wt 197.6 lb

## 2012-07-29 DIAGNOSIS — Z8679 Personal history of other diseases of the circulatory system: Secondary | ICD-10-CM

## 2012-07-29 DIAGNOSIS — Z951 Presence of aortocoronary bypass graft: Secondary | ICD-10-CM

## 2012-07-29 DIAGNOSIS — E7800 Pure hypercholesterolemia, unspecified: Secondary | ICD-10-CM

## 2012-07-29 DIAGNOSIS — I10 Essential (primary) hypertension: Secondary | ICD-10-CM

## 2012-07-29 MED ORDER — LISINOPRIL 10 MG PO TABS: 10.0000 mg | ORAL_TABLET | Freq: Two times a day (BID) | ORAL | Status: DC

## 2012-07-29 MED ORDER — AMLODIPINE BESYLATE 10 MG PO TABS: 10.0000 mg | ORAL_TABLET | Freq: Every day | ORAL | Status: DC

## 2012-07-29 MED ORDER — ATORVASTATIN CALCIUM 10 MG PO TABS
10.0000 mg | ORAL_TABLET | Freq: Every day | ORAL | Status: DC
Start: 2012-07-29 — End: 2013-07-16

## 2012-07-29 NOTE — Patient Instructions (Signed)
Your physician recommends that you continue on your current medications as directed. Please refer to the Current Medication list given to you today.  Your physician wants you to follow-up in: 4 months with fasting labs (lp/bmet/hfp) You will receive a reminder letter in the mail two months in advance. If you don't receive a letter, please call our office to schedule the follow-up appointment.  

## 2012-07-29 NOTE — Progress Notes (Signed)
Zachary Hall Date of Birth:  [birth year] [facility] [address] [place], [place]  [address] [phone]         Fax   [phone]  History of Present Illness: This pleasant 77 year old gentleman is seen for a scheduled 6 month followup office visit. He has a history of known ischemic heart disease. He underwent coronary artery bypass graft surgery on 02/07/10 by Dr. Laneta Simmers. He has been doing well. He is having no recurrent angina. He had been having a problem with epistaxis which has improved since he cut back on his aspirin to just 81 mg every other day. He has also cut back on his use of nasal steroids. He has not been expressing any racing of his heart. He has been having some problems with dizziness and on his own has cut back on his lisinopril. Previously he had been taking 20 mg twice a day.  Now he is taking just 10 mg twice a day.   Current Outpatient Prescriptions  Medication Sig Dispense Refill  . amLODipine (NORVASC) 10 MG tablet Take 1 tablet (10 mg total) by mouth daily.  90 tablet  3  . aspirin 81 MG tablet Take 81 mg by mouth. Every Other Day      . atorvastatin (LIPITOR) 10 MG tablet Take 1 tablet (10 mg total) by mouth daily.  90 tablet  3  . Casanthranol-Docusate Sodium 30-100 MG CAPS Take by mouth as needed. Stool Softner      . Cetirizine HCl (ZYRTEC PO) Take 10 mg by mouth daily.       [name] CHERRY PO Take 1 capsule by mouth daily. ** Black Cherry **       . Fluticasone-Salmeterol (ADVAIR DISKUS) 250-50 MCG/DOSE AEPB Inhale 1 puff into the lungs every 12 (twelve) hours. As needed      . hydrochlorothiazide (MICROZIDE) 12.5 MG capsule Take 1 capsule (12.5 mg total) by mouth daily.  30 capsule  5  . hydrocortisone (ANUSOL-HC) 25 MG suppository Place 1 suppository (25 mg total) rectally 2 (two) times daily.  20 suppository  1  . lisinopril (PRINIVIL,ZESTRIL) 10 MG tablet Take 1 tablet (10 mg total) by mouth 2 (two) times daily.  180 tablet  3  .  multivitamin (THERAGRAN) per tablet Take 1 tablet by mouth daily.        . nitroGLYCERIN (NITROSTAT) 0.4 MG SL tablet Place 1 tablet (0.4 mg total) under the tongue every 5 (five) minutes as needed.  25 tablet  3  . Omega-3 Fatty Acids (OMEGA 3 PO) Take 1 capsule by mouth daily.          No Known Allergies  Patient Active Problem List  Diagnosis  . Hypertension  . Hypercholesterolemia  . Coronary artery disease  . History of atrial fibrillation  . History of chest pain  . Arm pain  . Bradycardia  . Hx of CABG  . Hemorrhoids, internal, with bleeding    History  Smoking status  . Former Smoker  . Types: Cigarettes  . Quit date: 08/01/1959  Smokeless tobacco  . Never Used    History  Alcohol Use No    Family History  Problem Relation Age of Onset  . Alcohol abuse Father   . Colon cancer Mother   . Cancer Mother     colon and bone    Review of Systems: Constitutional: no fever chills diaphoresis or fatigue or change in weight.  Head and neck: no hearing loss, no  epistaxis, no photophobia or visual disturbance. Respiratory: No cough, shortness of breath or wheezing. Cardiovascular: No chest pain peripheral edema, palpitations. Gastrointestinal: No abdominal distention, no abdominal pain, no change in bowel habits hematochezia or melena. Genitourinary: No dysuria, no frequency, no urgency, no nocturia. Musculoskeletal:No arthralgias, no back pain, no gait disturbance or myalgias. Neurological: No dizziness, no headaches, no numbness, no seizures, no syncope, no weakness, no tremors. Hematologic: No lymphadenopathy, no easy bruising. Psychiatric: No confusion, no hallucinations, no sleep disturbance.    Physical Exam: Filed Vitals:   07/29/12 1031  BP: 138/82  Pulse: 60  Resp: 18   the general appearance reveals a well-developed well-nourished gentleman in no distress.The head and neck exam reveals pupils equal and reactive.  Extraocular movements are full.   There is no scleral icterus.  The mouth and pharynx are normal.  The neck is supple.  The carotids reveal no bruits.  The jugular venous pressure is normal.  The  thyroid is not enlarged.  There is no lymphadenopathy.  The chest is clear to percussion and auscultation.  There are no rales or rhonchi.  Expansion of the chest is symmetrical.  The precordium is quiet.  The first heart sound is normal.  The second heart sound is physiologically split.  There is no murmur gallop rub or click.  There is no abnormal lift or heave.  The abdomen is soft and nontender.  The bowel sounds are normal.  The liver and spleen are not enlarged.  There are no abdominal masses.  There are no abdominal bruits.  Extremities reveal good pedal pulses.  There is no phlebitis or edema.  There is no cyanosis or clubbing.  Strength is normal and symmetrical in all extremities.  There is no lateralizing weakness.  There are no sensory deficits.  The skin is warm and dry.  There is no rash.     Assessment / Plan: Continue same medication.  His serum sodium is slightly low at 132 and he will increase his dietary salt slightly. He has had some problems with recent hemorrhoids and he is also due to have his colonoscopy next month with Dr. Matthias Hughs. Recheck here in 4 months for followup office visit lipid panel hepatic function panel and basal metabolic panel

## 2012-07-29 NOTE — Assessment & Plan Note (Signed)
The patient has had no recurrent atrial fibrillation.  No TIA symptoms.

## 2012-07-29 NOTE — Assessment & Plan Note (Signed)
The patient has a history of hypercholesterolemia and is on Lipitor 10 mg daily.  He is not having any myalgias from Lipitor.  Blood work is satisfactory

## 2012-07-29 NOTE — Assessment & Plan Note (Signed)
The patient is no longer having any dizzy spells.  Regarding any chest pain or shortness of breath.  He has some occasional soreness in the right upper chest which is not related to exertion and which is not cardiac.

## 2012-08-05 ENCOUNTER — Other Ambulatory Visit: Payer: Self-pay | Admitting: Gastroenterology

## 2012-08-20 ENCOUNTER — Ambulatory Visit (INDEPENDENT_AMBULATORY_CARE_PROVIDER_SITE_OTHER): Payer: BC Managed Care – PPO | Admitting: Surgery

## 2012-08-20 ENCOUNTER — Encounter (INDEPENDENT_AMBULATORY_CARE_PROVIDER_SITE_OTHER): Payer: Self-pay | Admitting: Surgery

## 2012-08-20 VITALS — BP 140/72 | HR 60 | Temp 97.5°F | Resp 16 | Ht 71.0 in | Wt 193.4 lb

## 2012-08-20 DIAGNOSIS — K648 Other hemorrhoids: Secondary | ICD-10-CM

## 2012-08-20 NOTE — Progress Notes (Signed)
General Surgery [facility]  Visit Diagnoses: 1. Hemorrhoids, internal, with bleeding     HISTORY: Patient is a 78 year old white male who was evaluated in December for internal hemorrhoids with bleeding. He had mild prolapse. He underwent rubber band ligation and was treated with Anusol suppositories. He returns today for scheduled follow-up.  Patient also underwent colonoscopy by his gastroenterologist in the interim. He had 3 small benign polyps removed. No other significant findings were noted.  PERTINENT REVIEW OF SYSTEMS: Patient notes that bleeding has improved approximately 90%. He still has occasional blood with stool. He also notes some small mucous discharge.  EXAM: HEENT: normocephalic; pupils equal and reactive; sclerae clear; dentition good; mucous membranes moist GU:  External exam shows prominent external hemorrhoidal veins without thrombosis; no fistula; no fissure; digital exam shows normal tone. There is a prominent left hemorrhoidal column with slight prolapse and moderate edema. Anoscopy is performed and a 360 view of the lower rectum demonstrates a prominent left lateral column with superficial ulceration and prominent vasculature. EXT:  non-tender without edema; no deformity NEURO: no gross focal deficits; no sign of tremor  PROCEDURE: Using the illuminated anoscopes, rubber band ligation he is performed at the left lateral column without undue discomfort. No bleeding.  IMPRESSION: Internal hemorrhoids, grade 2-3, with history of bleeding and mild prolapse  PLAN: Rubber band ligation is repeated today. Hopefully this will continue symptomatic improvement over the coming weeks. Patient will return in 3 months for follow-up anoscopy and possible repeat treatment with rubber band ligation.  Velora Heckler, MD, [facility] Office: [phone]

## 2012-08-20 NOTE — Patient Instructions (Signed)
ANORECTAL PROCEDURES: 1.  Tub soaks 2-3 times daily in warm water (may add Epsom salts if desired) 2.  Stool softener for one month (store brand Miralax or Colace) 3.  Avoid toilet paper - use baby wipes or Tucks pads 4.  Increase water intake - 6-8 glasses daily 5.  Apply dry pad to area until drainage stops 

## 2012-08-22 ENCOUNTER — Telehealth (INDEPENDENT_AMBULATORY_CARE_PROVIDER_SITE_OTHER): Payer: Self-pay | Admitting: General Surgery

## 2012-08-22 NOTE — Telephone Encounter (Signed)
Pt called to report he is having excessive bleeding, and the banding has already come off the hem.  He is changing the pad up to TID for the bloody drainage.  Paged and updated Dr. Gerrit Friends, who recommended he come in tomorrow for recheck.  Pt very agreeable to this.  Appt made.

## 2012-08-23 ENCOUNTER — Ambulatory Visit (INDEPENDENT_AMBULATORY_CARE_PROVIDER_SITE_OTHER): Payer: BC Managed Care – PPO | Admitting: Surgery

## 2012-08-23 ENCOUNTER — Encounter (INDEPENDENT_AMBULATORY_CARE_PROVIDER_SITE_OTHER): Payer: Self-pay | Admitting: Surgery

## 2012-08-23 VITALS — BP 142/78 | HR 68 | Temp 97.2°F | Resp 18

## 2012-08-23 DIAGNOSIS — K648 Other hemorrhoids: Secondary | ICD-10-CM

## 2012-08-23 NOTE — Patient Instructions (Signed)
ANORECTAL PROCEDURES: 1.  Tub soaks 2-3 times daily in warm water (may add Epsom salts if desired) 2.  Stool softener for one month (store brand Miralax or Colace) 3.  Avoid toilet paper - use baby wipes or Tucks pads 4.  Increase water intake - 6-8 glasses daily 5.  Apply dry pad to area until drainage stops 

## 2012-08-23 NOTE — Progress Notes (Signed)
General Surgery [facility]  Visit Diagnoses: 1. Hemorrhoids, internal, with bleeding     HISTORY: Patient returns with persistent rectal bleeding. Patient had been seen earlier this week and underwent rubber band ligation of a left lateral hemorrhoidal column. Patient noted bleeding and actually found the small rubber band. He comes back today for further assessment.  EXAM: External examination is relatively normal. However with only slight eversion of the anus there is prolapse of the left lateral column. Anoscopy is performed in the left lateral column is ulcerative and quite friable. Instead of rubber band ligation, injection sclerotherapy is performed at 2 sites.  IMPRESSION: Grade 3 internal hemorrhoid with bleeding and prolapse  PLAN: The patient and I discussed these findings. I would like to wait approximately 3 weeks and then assess him in the office with anoscopy. If there is improvement in both bleeding and degree of prolapse, then I think we will continue to monitor him. If there is no significant improvement, then I think we must make a decision between open hemorrhoidectomy or possibly a PPH procedure. I will discuss this with my partners.  Patient will return in 3 weeks for anoscopy and assessment.  Velora Heckler, MD, FACS General & Endocrine Surgery [facility]

## 2012-09-10 ENCOUNTER — Encounter (INDEPENDENT_AMBULATORY_CARE_PROVIDER_SITE_OTHER): Payer: Self-pay | Admitting: Surgery

## 2012-09-10 ENCOUNTER — Ambulatory Visit (INDEPENDENT_AMBULATORY_CARE_PROVIDER_SITE_OTHER): Payer: BC Managed Care – PPO | Admitting: Surgery

## 2012-09-10 VITALS — BP 140/72 | HR 50 | Temp 97.4°F | Resp 18 | Ht 71.0 in | Wt 198.4 lb

## 2012-09-10 DIAGNOSIS — K648 Other hemorrhoids: Secondary | ICD-10-CM | POA: Insufficient documentation

## 2012-09-10 NOTE — Progress Notes (Signed)
General Surgery [facility]  Visit Diagnoses: 1. Hemorrhoids, internal, with bleeding   2. Prolapsed internal hemorrhoids     HISTORY: Patient is a 78 year old white male who returns for followup of bleeding internal hemorrhoids with prolapse. I have now attempted to treat him unsuccessfully with rubber band ligation and with injection sclerotherapy. Patient continues to note some bleeding with bowel movements. He has a small amount of mucus soilage during the day. He has no bleeding between bowel movements at this point. While symptoms are somewhat improved, they have certainly not resolved entirely.  EXAM: External examination shows relatively normal anoderm. Digital exam shows reasonable anal tone. No palpable masses. There is venous blood visible on the examination finger. Anoscopy is performed and there are multiple prominent columns of hemorrhoidal tissue. There is prolapse with Valsalva.  IMPRESSION: Internal hemorrhoids with bleeding and prolapse, refractory to office management  PLAN: The patient and I discussed my findings on examination today. While his symptoms have improved, I do not think we are really going to be able to solve his problem long-term with office management. As there are multiple columns involved, I believe he would be a good candidate for outpatient surgery using the PPH procedure. I am going to ask the patient to be evaluated by my partner, Dr. Chevis Pretty, for consideration for this procedure. Patient understands and is in agreement.  Velora Heckler, MD, FACS General & Endocrine Surgery [facility]

## 2012-09-10 NOTE — Patient Instructions (Signed)
Hemorrhoidectomy Hemorrhoidectomy is surgery to remove hemorrhoids. Hemorrhoids are veins that have become swollen in the rectum. The rectum is the area from the bottom end of the intestines to the opening where bowel movements leave the body. Hemorrhoids can be uncomfortable. They can cause itching, bleeding and pain if a blood clot forms in them (thrombose). If hemorrhoids are small, surgery may not be needed. But if they cover a larger area, surgery is usually suggested.  LET YOUR CAREGIVER KNOW ABOUT:   Any allergies.  All medications you are taking, including:  Herbs, eyedrops, over-the-counter medications and creams.  Blood thinners (anticoagulants), aspirin or other drugs that could affect blood clotting.  Use of steroids (by mouth or as creams).  Previous problems with anesthetics, including local anesthetics.  Possibility of pregnancy, if this applies.  Any history of blood clots.  Any history of bleeding or other blood problems.  Previous surgery.  Smoking history.  Other health problems. RISKS AND COMPLICATIONS All surgery carries some risk. However, hemorrhoid surgery usually goes smoothly. Possible complications could include:  Urinary retention.  Bleeding.  Infection.  A painful incision.  A reaction to the anesthesia (this is not common). BEFORE THE PROCEDURE   Stop using aspirin and non-steroidal anti-inflammatory drugs (NSAIDs) for pain relief. This includes prescription drugs and over-the-counter drugs such as ibuprofen and naproxen. Also stop taking vitamin E. If possible, do this two weeks before your surgery.  If you take blood-thinners, ask your healthcare provider when you should stop taking them.  You will probably have blood and urine tests done several days before your surgery.  Do not eat or drink for about 8 hours before the surgery.  Arrive at least an hour before the surgery, or whenever your surgeon recommends. This will give you time to  check in and fill out any needed paperwork.  Hemorrhoidectomy is often an outpatient procedure. This means you will be able to go home the same day. Sometimes, though, people stay overnight in the hospital after the procedure. Ask your surgeon what to expect. Either way, make arrangements in advance for someone to drive you home. PROCEDURE   The preparation:  You will change into a hospital gown.  You will be given an IV. A needle will be inserted in your arm. Medication can flow directly into your body through this needle.  You might be given an enema to clear your rectum.  Once in the operating room, you will probably lie on your side or be repositioned later to lying on your stomach.  You will be given anesthesia (medication) so you will not feel anything during the surgery. The surgery often is done with local anesthesia (the area near the hemorrhoids will be numb and you will be drowsy but awake). Sometimes, general anesthesia is used (you will be asleep during the procedure).  The procedure:  There are a few different procedures for hemorrhoids. Be sure to ask you surgeon about the procedure, the risks and benefits.  Be sure to ask about what you need to do to take care of the wound, if there is one. AFTER THE PROCEDURE  You will stay in a recovery area until the anesthesia has worn off. Your blood pressure and pulse will be checked every so often.  You may feel a lot of pain in the area of the rectum.  Take all pain medication prescribed by your surgeon. Ask before taking any over-the-counter pain medicines.  Sometimes sitting in a warm bath can help relieve   your pain.  To make sure you have bowel movements without straining:  You will probably need to take stool softeners (usually a pill) for a few days.  You should drink 8 to 10 glasses of water each day.  Your activity will be restricted for awhile. Ask your caregiver for a list of what you should and should not do  while you recover. Document Released: 05/14/2009 Document Revised: 10/09/2011 Document Reviewed: 05/14/2009 Exit[employer] Patient Information 2013 [employer].  

## 2012-09-23 ENCOUNTER — Encounter (INDEPENDENT_AMBULATORY_CARE_PROVIDER_SITE_OTHER): Payer: Self-pay | Admitting: General Surgery

## 2012-09-23 ENCOUNTER — Ambulatory Visit (INDEPENDENT_AMBULATORY_CARE_PROVIDER_SITE_OTHER): Payer: BC Managed Care – PPO | Admitting: General Surgery

## 2012-09-23 VITALS — BP 142/70 | HR 76 | Temp 97.7°F | Resp 16 | Ht 71.0 in | Wt 195.2 lb

## 2012-09-23 DIAGNOSIS — K648 Other hemorrhoids: Secondary | ICD-10-CM

## 2012-09-23 NOTE — Progress Notes (Signed)
Subjective:     Patient ID: Zachary Hall, male   DOB: 02/27/1935, 78 y.o.   MRN: [id number]  HPI The patient is a 78 year old white male who recently saw Dr. Gerrit Friends with prolapsing and bleeding internal hemorrhoids. He has had attempts in the recent past at injection sclerotherapy and banding of his internal hemorrhoids without relief. He also had a surgery by Dr. Kendrick Ranch quite a few years ago for the same problem. He mostly notes bleeding with his bowel movements. He denies any pain. The bleeding occurs on a very regular basis.  Review of Systems  Constitutional: Negative.   HENT: Negative.   Eyes: Negative.   Respiratory: Negative.   Cardiovascular: Negative.   Gastrointestinal: Positive for blood in stool and anal bleeding.  Endocrine: Negative.   Genitourinary: Negative.   Musculoskeletal: Negative.   Skin: Negative.   Allergic/Immunologic: Negative.   Neurological: Negative.   Hematological: Negative.   Psychiatric/Behavioral: Negative.        Objective:   Physical Exam  Constitutional: He is oriented to person, place, and time. He appears well-developed and well-nourished.  HENT:  Head: Normocephalic and atraumatic.  Eyes: Conjunctivae and EOM are normal. Pupils are equal, round, and reactive to light.  Neck: Normal range of motion. Neck supple.  Cardiovascular: Normal rate, regular rhythm and normal heart sounds.   Pulmonary/Chest: Effort normal and breath sounds normal.  Abdominal: Soft. Bowel sounds are normal.  Genitourinary:  There is minimal external hemorrhoidal disease. There is decent rectal tone. He has obviously prolapsing and irritated internal hemorrhoids.  Musculoskeletal: Normal range of motion.  Neurological: He is alert and oriented to person, place, and time.  Skin: Skin is warm and dry.  Psychiatric: He has a normal mood and affect. His behavior is normal.       Assessment:     The patient has prolapsing and bleeding internal hemorrhoids  that have failed more conservative management. Because of this I think he would be a good candidate for a PPH hemorrhoidectomy. He is also very interested in surgery. I have discussed with him in detail the risks and benefits of the operation as well as some of the technical aspects and he understands and wishes to proceed.     Plan:     Plan for [facility] hemorrhoidectomy

## 2012-09-23 NOTE — Patient Instructions (Signed)
Plan for [facility] hemorrhoidectomy

## 2012-10-29 ENCOUNTER — Encounter (HOSPITAL_COMMUNITY): Payer: Self-pay | Admitting: Pharmacy Technician

## 2012-11-05 ENCOUNTER — Encounter (HOSPITAL_COMMUNITY): Payer: Self-pay

## 2012-11-05 ENCOUNTER — Encounter (HOSPITAL_COMMUNITY)
Admission: RE | Admit: 2012-11-05 | Discharge: 2012-11-05 | Disposition: A | Discharge status: Home / Self Care | Payer: BC Managed Care – PPO | Source: Ambulatory Visit | Attending: General Surgery | Admitting: General Surgery

## 2012-11-05 ENCOUNTER — Encounter (HOSPITAL_COMMUNITY)
Admission: RE | Admit: 2012-11-05 | Discharge: 2012-11-05 | Disposition: A | Discharge status: Home / Self Care | Payer: BC Managed Care – PPO | Source: Ambulatory Visit | Attending: Anesthesiology | Admitting: Anesthesiology

## 2012-11-05 LAB — BASIC METABOLIC PANEL
CO2: 27 mEq/L (ref 19–32)
Calcium: 9.2 mg/dL (ref 8.4–10.5)
Chloride: 98 mEq/L (ref 96–112)
Creatinine, Ser: 1.01 mg/dL (ref 0.50–1.35)
Glucose, Bld: 98 mg/dL (ref 70–99)
Sodium: 134 mEq/L — ABNORMAL LOW (ref 135–145)

## 2012-11-05 LAB — CBC
HCT: 39.6 % (ref 39.0–52.0)
MCH: 33.6 pg (ref 26.0–34.0)
MCV: 92.3 fL (ref 78.0–100.0)
Platelets: 221 10*3/uL (ref 150–400)
RBC: 4.29 MIL/uL (ref 4.22–5.81)

## 2012-11-05 NOTE — Progress Notes (Addendum)
One day bowel prep inst given per barbara--day befor surgery 11/12/12 dulcolax tabs x 4 at 6 am, miralax 255gms mix with one gallon of gatorade (not red) and drink 8-10 oz q 30 min until all gone. No solids only clear liquids for the day    req'd any test ,latest notes from dr Patty Sermons

## 2012-11-05 NOTE — Pre-Procedure Instructions (Addendum)
CAELAN BRANDEN  11/05/2012   Your procedure is scheduled on:  11/13/12  Report to [facility] at 630 AM.  Call this number if you have problems the morning of surgery: [phone]   Remember:   Do not eat food or drink liquids after midnight.   Take these medicines the morning of surgery with A SIP OF WATER: amlodipine, advair  STOP aspirin, omegs 3 fish oil, any nsaids,coumadin,plavix, herbal meds   Do not wear jewelry, make-up or nail polish.  Do not wear lotions, powders, or perfumes. You may wear deodorant.  Do not shave 48 hours prior to surgery. Men may shave face and neck.  Do not bring valuables to the hospital.  Contacts, dentures or bridgework may not be worn into surgery.  Leave suitcase in the car. After surgery it may be brought to your room.  For patients admitted to the hospital, checkout time is 11:00 AM the day of  discharge.   Patients discharged the day of surgery will not be allowed to drive  home.  Name and phone number of your driver:  Special Instructions: Shower using CHG 2 nights before surgery and the night before surgery.  If you shower the day of surgery use CHG.  Use special wash - you have one bottle of CHG for all showers.  You should use approximately 1/3 of the bottle for each shower.   Please read over the following fact sheets that you were given: Pain Booklet, Coughing and Deep Breathing, MRSA Information and Surgical Site Infection Prevention

## 2012-11-06 NOTE — Progress Notes (Signed)
Anesthesia Chart Review:  Patient is a 79 year old male scheduled for procedure for prolapse hemorrhoids, hemorrhoidectomy by Dr. Carolynne Edouard on 11/13/12.  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  Cardiologist is [name], last visit was 07/29/12.  He was not having any recurrent angina or afib at that time.  His next appointment is scheduled for 12/09/12.  EKG on 11/05/12 showed SB @ 52 bpm, LAD, cannot rule out anterior infarct (age undetermined).  Poor r wave progression V2-V3 is new, otherwise not significantly changed since 01/12/11.  His last cardiac cath was on 01/04/10 prior to his CABG and showed severe 2V CAD and normal LV function.  CXR on 11/05/12 showed: No acute cardiopulmonary or pleural lesions are identified. There is hyperinflation configuration suggesting element of COPD. Post CABG.  Preoperative labs noted.  Patient has had routine follow-up with his cardiologist.  He did not have any worrisome CV symptoms at his last appointment in December 2013.  If he remains asymptomatic from a CV standpoint then would anticipate he could proceed as planned. Anesthesiologist Dr. Jacklynn Bue agrees with this plan.  He will be evaluated by his assigned anesthesiologist on the day of surgery.  Velna Ochs [facility]/Anesthesiology Phone [phone] 11/06/2012 12:42 PM

## 2012-11-12 MED ORDER — DEXTROSE 5 % IV SOLN
2.0000 g | INTRAVENOUS | Status: AC
  Administered 2012-11-13: 2 g via INTRAVENOUS
  Filled 2012-11-12: qty 2

## 2012-11-13 ENCOUNTER — Ambulatory Visit (HOSPITAL_COMMUNITY): Payer: BC Managed Care – PPO | Admitting: Anesthesiology

## 2012-11-13 ENCOUNTER — Encounter (HOSPITAL_COMMUNITY): Payer: Self-pay | Admitting: Vascular Surgery

## 2012-11-13 ENCOUNTER — Encounter (HOSPITAL_COMMUNITY): Payer: Self-pay | Admitting: *Deleted

## 2012-11-13 ENCOUNTER — Encounter (HOSPITAL_COMMUNITY)
Admission: RE | Disposition: A | Discharge status: Home / Self Care | Payer: Self-pay | Source: Ambulatory Visit | Attending: General Surgery

## 2012-11-13 ENCOUNTER — Ambulatory Visit (HOSPITAL_COMMUNITY)
Admission: RE | Admit: 2012-11-13 | Discharge: 2012-11-13 | Disposition: A | Discharge status: Home / Self Care | Payer: BC Managed Care – PPO | Source: Ambulatory Visit | Attending: General Surgery | Admitting: General Surgery

## 2012-11-13 DIAGNOSIS — K649 Unspecified hemorrhoids: Secondary | ICD-10-CM

## 2012-11-13 DIAGNOSIS — K648 Other hemorrhoids: Secondary | ICD-10-CM | POA: Insufficient documentation

## 2012-11-13 HISTORY — PX: HEMORRHOID SURGERY: SHX153

## 2012-11-13 SURGERY — HEMORRHOIDECTOMY PROLAPSED
Anesthesia: General | Site: Rectum | Wound class: Clean Contaminated

## 2012-11-13 MED ORDER — SURGILUBE EX GEL
CUTANEOUS | Status: DC | PRN
  Administered 2012-11-13: 1 via TOPICAL

## 2012-11-13 MED ORDER — HYDROMORPHONE HCL PF 1 MG/ML IJ SOLN
0.2500 mg | INTRAMUSCULAR | Status: DC | PRN
  Administered 2012-11-13: 0.5 mg via INTRAVENOUS

## 2012-11-13 MED ORDER — HEMOSTATIC AGENTS (NO CHARGE) OPTIME
TOPICAL | Status: DC | PRN
  Administered 2012-11-13: 1 via TOPICAL

## 2012-11-13 MED ORDER — ONDANSETRON HCL 4 MG/2ML IJ SOLN
INTRAMUSCULAR | Status: DC | PRN
  Administered 2012-11-13: 4 mg via INTRAVENOUS

## 2012-11-13 MED ORDER — GLYCOPYRROLATE 0.2 MG/ML IJ SOLN
INTRAMUSCULAR | Status: DC | PRN
  Administered 2012-11-13: 0.2 mg via INTRAVENOUS
  Administered 2012-11-13: .4 mg via INTRAVENOUS

## 2012-11-13 MED ORDER — ONDANSETRON HCL 4 MG/2ML IJ SOLN: 4.0000 mg | Freq: Once | INTRAMUSCULAR | Status: DC | PRN

## 2012-11-13 MED ORDER — 0.9 % SODIUM CHLORIDE (POUR BTL) OPTIME
TOPICAL | Status: DC | PRN
  Administered 2012-11-13: 1000 mL

## 2012-11-13 MED ORDER — HYALURONIDASE HUMAN 150 UNIT/ML IJ SOLN
INTRAMUSCULAR | Status: DC | PRN
  Administered 2012-11-13: 100 [IU] via SUBCUTANEOUS

## 2012-11-13 MED ORDER — HYDROMORPHONE HCL PF 1 MG/ML IJ SOLN
INTRAMUSCULAR | Status: AC
  Filled 2012-11-13: qty 1

## 2012-11-13 MED ORDER — LIDOCAINE HCL 4 % MT SOLN
OROMUCOSAL | Status: DC | PRN
  Administered 2012-11-13: 4 mL via TOPICAL

## 2012-11-13 MED ORDER — DIBUCAINE 1 % EX OINT: TOPICAL_OINTMENT | Freq: Three times a day (TID) | CUTANEOUS | Status: DC | PRN

## 2012-11-13 MED ORDER — NEOSTIGMINE METHYLSULFATE 1 MG/ML IJ SOLN
INTRAMUSCULAR | Status: DC | PRN
  Administered 2012-11-13: 3 mg via INTRAVENOUS

## 2012-11-13 MED ORDER — ROCURONIUM BROMIDE 100 MG/10ML IV SOLN
INTRAVENOUS | Status: DC | PRN
  Administered 2012-11-13: 30 mg via INTRAVENOUS

## 2012-11-13 MED ORDER — HYDROCODONE-ACETAMINOPHEN 5-325 MG PO TABS: 1.0000 | ORAL_TABLET | Freq: Four times a day (QID) | ORAL | Status: DC | PRN

## 2012-11-13 MED ORDER — BUPIVACAINE-EPINEPHRINE PF 0.25-1:200000 % IJ SOLN
INTRAMUSCULAR | Status: AC
  Filled 2012-11-13: qty 30

## 2012-11-13 MED ORDER — LACTATED RINGERS IV SOLN
INTRAVENOUS | Status: DC | PRN
  Administered 2012-11-13 (×2): via INTRAVENOUS

## 2012-11-13 MED ORDER — HYALURONIDASE HUMAN 150 UNIT/ML IJ SOLN
INTRAMUSCULAR | Status: AC
  Filled 2012-11-13: qty 1

## 2012-11-13 MED ORDER — PROPOFOL 10 MG/ML IV BOLUS
INTRAVENOUS | Status: DC | PRN
  Administered 2012-11-13: 200 mg via INTRAVENOUS
  Administered 2012-11-13: 50 mg via INTRAVENOUS

## 2012-11-13 MED ORDER — BUPIVACAINE-EPINEPHRINE 0.25% -1:200000 IJ SOLN
INTRAMUSCULAR | Status: DC | PRN
  Administered 2012-11-13: 20 mL

## 2012-11-13 MED ORDER — DIBUCAINE 1 % RE OINT
TOPICAL_OINTMENT | RECTAL | Status: AC
  Filled 2012-11-13: qty 28

## 2012-11-13 MED ORDER — FENTANYL CITRATE 0.05 MG/ML IJ SOLN
INTRAMUSCULAR | Status: DC | PRN
  Administered 2012-11-13 (×2): 100 ug via INTRAVENOUS

## 2012-11-13 MED ORDER — LIDOCAINE HCL (CARDIAC) 20 MG/ML IV SOLN
INTRAVENOUS | Status: DC | PRN
  Administered 2012-11-13 (×2): 50 mg via INTRAVENOUS

## 2012-11-13 MED ORDER — DIBUCAINE 1 % RE OINT
TOPICAL_OINTMENT | RECTAL | Status: DC | PRN
  Administered 2012-11-13: 1 via RECTAL

## 2012-11-13 SURGICAL SUPPLY — 40 items
APL SKNCLS STERI-STRIP NONHPOA (GAUZE/BANDAGES/DRESSINGS) ×2
BENZOIN TINCTURE PRP APPL 2/3 (GAUZE/BANDAGES/DRESSINGS) ×4 IMPLANT
BLADE SURG 15 STRL LF DISP TIS (BLADE) ×1 IMPLANT
BLADE SURG 15 STRL SS (BLADE) ×2
CANISTER SUCTION 2500CC (MISCELLANEOUS) ×2 IMPLANT
CLOTH BEACON ORANGE TIMEOUT ST (SAFETY) ×2 IMPLANT
COVER SURGICAL LIGHT HANDLE (MISCELLANEOUS) ×2 IMPLANT
DECANTER SPIKE VIAL GLASS SM (MISCELLANEOUS) ×2 IMPLANT
DRAPE LAPAROTOMY T 102X78X121 (DRAPES) ×2 IMPLANT
DRAPE UTILITY 15X26 W/TAPE STR (DRAPE) ×4 IMPLANT
DRSG PAD ABDOMINAL 8X10 ST (GAUZE/BANDAGES/DRESSINGS) ×2 IMPLANT
ELECT CAUTERY BLADE 6.4 (BLADE) ×2 IMPLANT
ELECT REM PT RETURN 9FT ADLT (ELECTROSURGICAL) ×2
ELECTRODE REM PT RTRN 9FT ADLT (ELECTROSURGICAL) ×1 IMPLANT
GAUZE SPONGE 4X4 16PLY XRAY LF (GAUZE/BANDAGES/DRESSINGS) ×2 IMPLANT
GLOVE BIO SURGEON STRL SZ7.5 (GLOVE) ×2 IMPLANT
GOWN STRL NON-REIN LRG LVL3 (GOWN DISPOSABLE) ×4 IMPLANT
KIT BASIN OR (CUSTOM PROCEDURE TRAY) ×2 IMPLANT
KIT ROOM TURNOVER OR (KITS) ×2 IMPLANT
NEEDLE 18GX1X1/2 (RX/OR ONLY) (NEEDLE) ×2 IMPLANT
NEEDLE 22X1 1/2 (OR ONLY) (NEEDLE) ×2 IMPLANT
NS IRRIG 1000ML POUR BTL (IV SOLUTION) ×2 IMPLANT
PACK SURGICAL SETUP 50X90 (CUSTOM PROCEDURE TRAY) ×2 IMPLANT
PAD ARMBOARD 7.5X6 YLW CONV (MISCELLANEOUS) ×4 IMPLANT
PENCIL BUTTON HOLSTER BLD 10FT (ELECTRODE) ×2 IMPLANT
PROXIMATE PPH PROLAPSE AND HEMORRHOIDS ×2 IMPLANT
SPECIMEN JAR SMALL (MISCELLANEOUS) ×2 IMPLANT
SPONGE GAUZE 4X4 12PLY (GAUZE/BANDAGES/DRESSINGS) ×2 IMPLANT
SPONGE SURGIFOAM ABS GEL 12-7 (HEMOSTASIS) ×2 IMPLANT
SURGILUBE 2OZ TUBE FLIPTOP (MISCELLANEOUS) ×2 IMPLANT
SURGILUBE 3G PEEL PACK STRL (MISCELLANEOUS) ×2 IMPLANT
SUT PROLENE 2 0 SH DA (SUTURE) ×2 IMPLANT
SUT VIC AB 4-0 SH 18 (SUTURE) ×2 IMPLANT
SYR CONTROL 10ML LL (SYRINGE) ×2 IMPLANT
TAPE CLOTH SURG 6X10 WHT LF (GAUZE/BANDAGES/DRESSINGS) ×2 IMPLANT
TOWEL OR 17X24 6PK STRL BLUE (TOWEL DISPOSABLE) ×2 IMPLANT
TOWEL OR 17X26 10 PK STRL BLUE (TOWEL DISPOSABLE) ×2 IMPLANT
TUBE CONNECTING 12X1/4 (SUCTIONS) ×2 IMPLANT
WATER STERILE IRR 1000ML POUR (IV SOLUTION) ×2 IMPLANT
YANKAUER SUCT BULB TIP NO VENT (SUCTIONS) ×2 IMPLANT

## 2012-11-13 NOTE — H&P (Signed)
           Randa Evens Description: 78 year old male  09/23/2012 3:00 PM Office Visit Provider: Robyne Askew, MD  MRN: [id number] Department: [facility]            Diagnoses Reason for Visit   Prolapsed internal hemorrhoids - Primary  New Evaluation   455.2  eval hems for poss pph           Current Vitals - Last Recorded    BP Pulse Temp(Src) Resp Ht Wt   142/70 76 97.7 F (36.5 C) (Temporal) 16 5\' 11"  (1.803 m) 195 lb 3.2 oz (88.542 kg)       BMI             27.24 kg/m2                 Progress Notes    Robyne Askew, MD at 09/23/2012 4:02 PM    Status: Signed             Subjective:     Patient ID: Zachary Hall, male DOB: [birth year], 78 y.o. MRN: [id number]  HPI  The patient is a 78 year old white male who recently saw Dr. Gerrit Friends with prolapsing and bleeding internal hemorrhoids. He has had attempts in the recent past at injection sclerotherapy and banding of his internal hemorrhoids without relief. He also had a surgery by Dr. Kendrick Ranch quite a few years ago for the same problem. He mostly notes bleeding with his bowel movements. He denies any pain. The bleeding occurs on a very regular basis.  Review of Systems  Constitutional: Negative.  HENT: Negative.  Eyes: Negative.  Respiratory: Negative.  Cardiovascular: Negative.  Gastrointestinal: Positive for blood in stool and anal bleeding.  Endocrine: Negative.  Genitourinary: Negative.  Musculoskeletal: Negative.  Skin: Negative.  Allergic/Immunologic: Negative.  Neurological: Negative.  Hematological: Negative.  Psychiatric/Behavioral: Negative.     Objective:     Physical Exam  Constitutional: He is oriented to person, place, and time. He appears well-developed and well-nourished.  HENT:  Head: Normocephalic and atraumatic.  Eyes: Conjunctivae and EOM are normal. Pupils are equal, round, and reactive to light.  Neck: Normal range of motion. Neck supple.  Cardiovascular:  Normal rate, regular rhythm and normal heart sounds.  Pulmonary/Chest: Effort normal and breath sounds normal.  Abdominal: Soft. Bowel sounds are normal.  Genitourinary:  There is minimal external hemorrhoidal disease. There is decent rectal tone. He has obviously prolapsing and irritated internal hemorrhoids.  Musculoskeletal: Normal range of motion.  Neurological: He is alert and oriented to person, place, and time.  Skin: Skin is warm and dry.  Psychiatric: He has a normal mood and affect. His behavior is normal.     Assessment:     The patient has prolapsing and bleeding internal hemorrhoids that have failed more conservative management. Because of this I think he would be a good candidate for a PPH hemorrhoidectomy. He is also very interested in surgery. I have discussed with him in detail the risks and benefits of the operation as well as some of the technical aspects and he understands and wishes to proceed.     Plan:     Plan for [facility] hemorrhoidectomy

## 2012-11-13 NOTE — Preoperative (Signed)
Beta Blockers   Reason not to administer Beta Blockers:Not Applicable 
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
## 2012-11-13 NOTE — Anesthesia Preprocedure Evaluation (Signed)
Anesthesia Evaluation  Patient identified by MRN, date of birth, ID band Patient awake    Reviewed: Allergy & Precautions, H&P , NPO status , Patient's Chart, lab work & pertinent test results  Airway Mallampati: I TM Distance: >3 FB Neck ROM: full    Dental   Pulmonary          Cardiovascular hypertension, + CAD, + Past MI and + CABG + dysrhythmias Atrial Fibrillation Rhythm:regular Rate:Normal     Neuro/Psych    GI/Hepatic   Endo/Other    Renal/GU      Musculoskeletal   Abdominal   Peds  Hematology   Anesthesia Other Findings   Reproductive/Obstetrics                           Anesthesia Physical Anesthesia Plan  ASA: III  Anesthesia Plan: General   Post-op Pain Management:    Induction: Intravenous  Airway Management Planned: Oral ETT  Additional Equipment:   Intra-op Plan:   Post-operative Plan: Extubation in OR  Informed Consent: I have reviewed the patients History and Physical, chart, labs and discussed the procedure including the risks, benefits and alternatives for the proposed anesthesia with the patient or authorized representative who has indicated his/her understanding and acceptance.     Plan Discussed with: CRNA, Anesthesiologist and Surgeon  Anesthesia Plan Comments:         Anesthesia Quick Evaluation

## 2012-11-13 NOTE — Interval H&P Note (Signed)
History and Physical Interval Note:  11/13/2012 8:03 AM  Zachary Hall  has presented today for surgery, with the diagnosis of internal hemorrhoids  The various methods of treatment have been discussed with the patient and family. After consideration of risks, benefits and other options for treatment, the patient has consented to  Procedure(s): PPH Hemorrhoidectomy (N/A) as a surgical intervention .  The patient's history has been reviewed, patient examined, no change in status, stable for surgery.  I have reviewed the patient's chart and labs.  Questions were answered to the patient's satisfaction.     TOTH III,Annalei Friesz S

## 2012-11-13 NOTE — Anesthesia Postprocedure Evaluation (Signed)
  Anesthesia Post-op Note  Patient: Zachary Hall  Procedure(s) Performed: Procedure(s): PPH Hemorrhoidectomy (N/A)  Patient Location: PACU  Anesthesia Type:General  Level of Consciousness: awake, alert , oriented and patient cooperative  Airway and Oxygen Therapy: Patient Spontanous Breathing  Post-op Pain: mild  Post-op Assessment: Post-op Vital signs reviewed, Patient's Cardiovascular Status Stable, Respiratory Function Stable, Patent Airway, No signs of Nausea or vomiting and Pain level controlled  Post-op Vital Signs: stable  Complications: No apparent anesthesia complications

## 2012-11-13 NOTE — Transfer of Care (Signed)
Immediate Anesthesia Transfer of Care Note  Patient: Zachary Hall  Procedure(s) Performed: Procedure(s): PPH Hemorrhoidectomy (N/A)  Patient Location: PACU  Anesthesia Type:General  Level of Consciousness: awake, alert  and oriented  Airway & Oxygen Therapy: Patient Spontanous Breathing and Patient connected to nasal cannula oxygen  Post-op Assessment: Report given to PACU RN and Post -op Vital signs reviewed and stable  Post vital signs: Reviewed and stable  Complications: No apparent anesthesia complications

## 2012-11-13 NOTE — Anesthesia Procedure Notes (Signed)
Procedure Name: Intubation Date/Time: 11/13/2012 8:35 AM Performed by: Marena Chancy Pre-anesthesia Checklist: Emergency Drugs available, Patient identified, Timeout performed, Suction available and Patient being monitored Patient Re-evaluated:Patient Re-evaluated prior to inductionOxygen Delivery Method: Circle system utilized Preoxygenation: Pre-oxygenation with 100% oxygen Intubation Type: IV induction Ventilation: Mask ventilation without difficulty and Oral airway inserted - appropriate to patient size Laryngoscope Size: Mac and 3 Grade View: Grade III Tube type: Oral Tube size: 7.5 mm Number of attempts: 2 Airway Equipment and Method: Bougie stylet and LTA kit utilized Placement Confirmation: ETT inserted through vocal cords under direct vision,  breath sounds checked- equal and bilateral,  positive ETCO2 and CO2 detector Secured at: 23 cm Tube secured with: Tape Dental Injury: Teeth and Oropharynx as per pre-operative assessment  Difficulty Due To: Difficulty was anticipated, Difficult Airway- due to anterior larynx and Difficult Airway- due to reduced neck mobility Future Recommendations: Recommend- induction with short-acting agent, and alternative techniques readily available

## 2012-11-14 ENCOUNTER — Encounter (HOSPITAL_COMMUNITY): Payer: Self-pay | Admitting: General Surgery

## 2012-11-22 ENCOUNTER — Other Ambulatory Visit: Payer: Self-pay

## 2012-11-22 MED ORDER — AMLODIPINE BESYLATE 10 MG PO TABS: 10.0000 mg | ORAL_TABLET | Freq: Every day | ORAL | Status: DC

## 2012-11-22 NOTE — Telephone Encounter (Signed)
..   Requested Prescriptions   Signed Prescriptions Disp Refills  . amLODipine (NORVASC) 10 MG tablet 90 tablet 0    Sig: Take 1 tablet (10 mg total) by mouth daily.    Authorizing Provider: Cassell Clement    Ordering User: Christella Hartigan, Rylin Saez Judie Petit

## 2012-11-25 ENCOUNTER — Other Ambulatory Visit (INDEPENDENT_AMBULATORY_CARE_PROVIDER_SITE_OTHER): Payer: BC Managed Care – PPO

## 2012-11-25 DIAGNOSIS — E7800 Pure hypercholesterolemia, unspecified: Secondary | ICD-10-CM

## 2012-11-25 LAB — LIPID PANEL
Cholesterol: 132 mg/dL (ref 0–200)
LDL Cholesterol: 57 mg/dL (ref 0–99)
Triglycerides: 155 mg/dL — ABNORMAL HIGH (ref 0.0–149.0)
VLDL: 31 mg/dL (ref 0.0–40.0)

## 2012-11-25 LAB — BASIC METABOLIC PANEL
Chloride: 99 mEq/L (ref 96–112)
Creatinine, Ser: 1.2 mg/dL (ref 0.4–1.5)
Potassium: 3.9 mEq/L (ref 3.5–5.1)

## 2012-11-25 LAB — HEPATIC FUNCTION PANEL
Albumin: 3.8 g/dL (ref 3.5–5.2)
Total Protein: 6.7 g/dL (ref 6.0–8.3)

## 2012-11-26 ENCOUNTER — Encounter (INDEPENDENT_AMBULATORY_CARE_PROVIDER_SITE_OTHER): Payer: Self-pay | Admitting: General Surgery

## 2012-11-26 ENCOUNTER — Ambulatory Visit (INDEPENDENT_AMBULATORY_CARE_PROVIDER_SITE_OTHER): Payer: BC Managed Care – PPO | Admitting: General Surgery

## 2012-11-26 VITALS — BP 126/62 | HR 56 | Temp 97.0°F | Resp 16 | Ht 71.0 in | Wt 193.0 lb

## 2012-11-26 DIAGNOSIS — K648 Other hemorrhoids: Secondary | ICD-10-CM

## 2012-11-26 NOTE — Progress Notes (Signed)
Subjective:     Patient ID: Zachary Hall, male   DOB: Dec 04, 1934, 78 y.o.   MRN: [id number]  HPI The patient is a 78 year old white male who is about 2 weeks status post PPH hemorrhoidectomy. He denies any rectal pain. He's had only one small episode of some blood in his stool.  Review of Systems     Objective:   Physical Exam On exam his perirectal skin looks good and he seems to have good rectal tone area he denies any incontinence.    Assessment:     The patient is 2 weeks status post PPH hemorrhoidectomy     Plan:     At this point I believe he can begin returning to his normal activities without any restrictions and we will plan to see him back in about a month or 2.

## 2012-11-26 NOTE — Patient Instructions (Signed)
May return to normal activities 

## 2012-11-27 NOTE — Progress Notes (Signed)
Quick Note:  Please make copy of labs for patient visit. ______ 

## 2012-12-02 ENCOUNTER — Ambulatory Visit: Payer: BC Managed Care – PPO | Admitting: Cardiology

## 2012-12-09 ENCOUNTER — Encounter: Payer: Self-pay | Admitting: Cardiology

## 2012-12-09 ENCOUNTER — Ambulatory Visit (INDEPENDENT_AMBULATORY_CARE_PROVIDER_SITE_OTHER): Payer: BC Managed Care – PPO | Admitting: Cardiology

## 2012-12-09 VITALS — BP 124/72 | HR 66 | Ht 71.0 in | Wt 193.8 lb

## 2012-12-09 DIAGNOSIS — I10 Essential (primary) hypertension: Secondary | ICD-10-CM

## 2012-12-09 DIAGNOSIS — Z951 Presence of aortocoronary bypass graft: Secondary | ICD-10-CM

## 2012-12-09 DIAGNOSIS — E7800 Pure hypercholesterolemia, unspecified: Secondary | ICD-10-CM

## 2012-12-09 DIAGNOSIS — I119 Hypertensive heart disease without heart failure: Secondary | ICD-10-CM

## 2012-12-09 NOTE — Assessment & Plan Note (Signed)
The patient has had no recurrent chest pain or angina and has done well since his bypass operation

## 2012-12-09 NOTE — Assessment & Plan Note (Signed)
The patient is on low-dose Lipitor for hypercholesterolemia and ischemic heart disease.  Lab work is excellent.

## 2012-12-09 NOTE — Patient Instructions (Signed)
Your physician recommends that you continue on your current medications as directed. Please refer to the Current Medication list given to you today.  Your physician recommends that you schedule a follow-up appointment in: 4 months with fasting labs (lp/bmet/hfp)  

## 2012-12-09 NOTE — Progress Notes (Signed)
Randa Evens Date of Birth:  1935-03-05 [facility] [address] [place], [place]  [address] [phone]         Fax   [phone]  History of Present Illness: This pleasant 78 year old gentleman is seen for a scheduled 6 month followup office visit. He has a history of known ischemic heart disease. He underwent coronary artery bypass graft surgery on 02/07/10 by Dr. Laneta Simmers. He has been doing well. He is having no recurrent angina. He had been having a problem with epistaxis which has improved since he cut back on his aspirin to just 81 mg every other day. He has also cut back on his use of nasal steroids. He has not been expressing any racing of his heart.  His occasional dizzy spells have improved as he cut back on his lisinopril.  Current Outpatient Prescriptions  Medication Sig Dispense Refill  . amLODipine (NORVASC) 10 MG tablet Take 1 tablet (10 mg total) by mouth daily.  90 tablet  0  . aspirin 81 MG tablet Take 81 mg by mouth. Every Other Day      . atorvastatin (LIPITOR) 10 MG tablet Take 1 tablet (10 mg total) by mouth daily.  90 tablet  3  . Casanthranol-Docusate Sodium 30-100 MG CAPS Take by mouth as needed. Stool Softner      . Cetirizine HCl (ZYRTEC PO) Take 10 mg by mouth daily.       [name] CHERRY PO Take 1 capsule by mouth daily. ** Black Cherry **       . dibucaine (NUPERCAINAL) 1 % ointment Apply topically 3 (three) times daily as needed for pain.  30 g  3  . Fluticasone-Salmeterol (ADVAIR DISKUS) 250-50 MCG/DOSE AEPB Inhale 1 puff into the lungs every 12 (twelve) hours. For shortness of breath      . hydrochlorothiazide (MICROZIDE) 12.5 MG capsule Take 1 capsule (12.5 mg total) by mouth daily.  30 capsule  5  . HYDROcodone-acetaminophen (NORCO) 5-325 MG per tablet Take 1-2 tablets by mouth every 6 (six) hours as needed for pain.  30 tablet  2  . hydrocortisone (ANUSOL-HC) 25 MG suppository Place 25 mg rectally 2 (two) times daily.      [name] lisinopril  (PRINIVIL,ZESTRIL) 10 MG tablet Take 1 tablet (10 mg total) by mouth 2 (two) times daily.  180 tablet  3  . multivitamin (THERAGRAN) per tablet Take 1 tablet by mouth daily.        . nitroGLYCERIN (NITROSTAT) 0.4 MG SL tablet Place 0.4 mg under the tongue every 5 (five) minutes as needed. For chest pain      . Omega-3 Fatty Acids (OMEGA 3 PO) Take 1 capsule by mouth daily.         No current facility-administered medications for this visit.    No Known Allergies  Patient Active Problem List   Diagnosis Date Noted  . Hypertension     Priority: High  . Hx of CABG 12/15/2010    Priority: Medium  . Arm pain 11/21/2010    Priority: Medium  . Prolapsed internal hemorrhoids 09/10/2012  . Hemorrhoids, internal, with bleeding 07/08/2012  . Bradycardia 11/21/2010  . Hypercholesterolemia   . Coronary artery disease   . History of atrial fibrillation   . History of chest pain     History  Smoking status  . Former Smoker  . Types: Cigarettes  . Quit date: 01/08/1960  Smokeless tobacco  . Never Used    Comment: occ alcohol  History  Alcohol Use  . Yes    Family History  Problem Relation Age of Onset  . Alcohol abuse Father   . Colon cancer Mother   . Cancer Mother     colon and bone    Review of Systems: Constitutional: no fever chills diaphoresis or fatigue or change in weight.  Head and neck: no hearing loss, no epistaxis, no photophobia or visual disturbance. Respiratory: No cough, shortness of breath or wheezing. Cardiovascular: No chest pain peripheral edema, palpitations. Gastrointestinal: No abdominal distention, no abdominal pain, no change in bowel habits hematochezia or melena. Genitourinary: No dysuria, no frequency, no urgency, no nocturia. Musculoskeletal:No arthralgias, no back pain, no gait disturbance or myalgias. Neurological: No dizziness, no headaches, no numbness, no seizures, no syncope, no weakness, no tremors. Hematologic: No lymphadenopathy, no  easy bruising. Psychiatric: No confusion, no hallucinations, no sleep disturbance.    Physical Exam: Filed Vitals:   12/09/12 1500  BP: 124/72  Pulse: 66   the general appearance reveals a well-developed well-nourished in no distress.The head and neck exam reveals pupils equal and reactive.  Extraocular movements are full.  There is no scleral icterus.  The mouth and pharynx are normal.  The neck is supple.  The carotids reveal no bruits.  The jugular venous pressure is normal.  The  thyroid is not enlarged.  There is no lymphadenopathy.  The chest is clear to percussion and auscultation.  There are no rales or rhonchi.  Expansion of the chest is symmetrical.  The precordium is quiet.  The first heart sound is normal.  The second heart sound is physiologically split.  There is no murmur gallop rub or click.  There is no abnormal lift or heave.  The abdomen is soft and nontender.  The bowel sounds are normal.  The liver and spleen are not enlarged.  There are no abdominal masses.  There are no abdominal bruits.  Extremities reveal good pedal pulses.  There is no phlebitis or edema.  There is no cyanosis or clubbing.  Strength is normal and symmetrical in all extremities.  There is no lateralizing weakness.  There are no sensory deficits.  The skin is warm and dry.  There is no rash.     Assessment / Plan: Continue on same medication.  Continue regular exercise which comprised mostly of yardwork.  Prudent diet.  Recheck in 4 months for office visit lipid panel hepatic function panel and basal metabolic panel.

## 2012-12-09 NOTE — Assessment & Plan Note (Signed)
The patient has not been having any headaches or dizzy spells.  Blood pressure was remaining stable on current therapy.

## 2013-01-06 ENCOUNTER — Telehealth: Payer: Self-pay | Admitting: Cardiology

## 2013-01-06 DIAGNOSIS — Z951 Presence of aortocoronary bypass graft: Secondary | ICD-10-CM

## 2013-01-06 MED ORDER — LISINOPRIL 20 MG PO TABS: 20.0000 mg | ORAL_TABLET | Freq: Two times a day (BID) | ORAL | Status: DC

## 2013-01-06 NOTE — Telephone Encounter (Signed)
Yesterday blood pressure was up to 171 systolic, while reading newspaper vision started getting blurred. Blood pressure today has been 150's (one reading in 137, feeling ok, but head doesn't feel quite right like pressure or something. Did have nose bleed this am, polyps remove last year. Taking all his medications as listed. Will forward to  [name] for review.

## 2013-01-06 NOTE — Telephone Encounter (Signed)
Advised patient

## 2013-01-06 NOTE — Telephone Encounter (Signed)
New problem    Pt wants to be seen today due to b/p

## 2013-01-06 NOTE — Telephone Encounter (Signed)
Increase lisinopril to 20 mg twice a day.

## 2013-01-27 ENCOUNTER — Ambulatory Visit (INDEPENDENT_AMBULATORY_CARE_PROVIDER_SITE_OTHER): Payer: BC Managed Care – PPO | Admitting: General Surgery

## 2013-01-27 ENCOUNTER — Encounter (INDEPENDENT_AMBULATORY_CARE_PROVIDER_SITE_OTHER): Payer: Self-pay | Admitting: General Surgery

## 2013-01-27 VITALS — BP 136/80 | HR 60 | Temp 98.0°F | Resp 18 | Ht 71.0 in | Wt 186.0 lb

## 2013-01-27 DIAGNOSIS — K648 Other hemorrhoids: Secondary | ICD-10-CM

## 2013-01-27 NOTE — Patient Instructions (Signed)
May return to all normal activities 

## 2013-01-27 NOTE — Progress Notes (Signed)
Subjective:     Patient ID: Zachary Hall, male   DOB: 1935-03-17, 78 y.o.   MRN: [id number]  HPI The patient is a 78 year old white male who is 2 months status post PPH hemorrhoidectomy. He is doing very well and has no complaints today. His appetite is good and his bowels are working normally. He has not noticed any blood in his stool. He denies any rectal pain.  Review of Systems     Objective:   Physical Exam On exam his perirectal skin looks good. He has minimal external disease. He has decreased rectal tone which is unchanged from prior to surgery. He has no palpable mass on digital exam.    Assessment:     The patient is 2 months status post PPH hemorrhoidectomy     Plan:     At this point he may return all his normal activities without any restrictions. We will plan to see him back on a when necessary basis.

## 2013-02-13 ENCOUNTER — Other Ambulatory Visit: Payer: Self-pay | Admitting: Cardiology

## 2013-03-14 ENCOUNTER — Other Ambulatory Visit: Payer: Self-pay | Admitting: Cardiology

## 2013-04-08 ENCOUNTER — Other Ambulatory Visit (INDEPENDENT_AMBULATORY_CARE_PROVIDER_SITE_OTHER): Payer: BC Managed Care – PPO

## 2013-04-08 DIAGNOSIS — I119 Hypertensive heart disease without heart failure: Secondary | ICD-10-CM

## 2013-04-08 LAB — LIPID PANEL
Cholesterol: 141 mg/dL (ref 0–200)
LDL Cholesterol: 64 mg/dL (ref 0–99)
Triglycerides: 102 mg/dL (ref 0.0–149.0)
VLDL: 20.4 mg/dL (ref 0.0–40.0)

## 2013-04-08 LAB — BASIC METABOLIC PANEL
Chloride: 100 mEq/L (ref 96–112)
Creatinine, Ser: 1 mg/dL (ref 0.4–1.5)

## 2013-04-08 LAB — HEPATIC FUNCTION PANEL
Albumin: 4.2 g/dL (ref 3.5–5.2)
Alkaline Phosphatase: 47 U/L (ref 39–117)
Total Protein: 6.9 g/dL (ref 6.0–8.3)

## 2013-04-09 NOTE — Progress Notes (Signed)
Quick Note:  Please make copy of labs for patient visit. ______ 

## 2013-04-11 ENCOUNTER — Ambulatory Visit: Payer: BC Managed Care – PPO | Admitting: Cardiology

## 2013-04-11 ENCOUNTER — Other Ambulatory Visit: Payer: BC Managed Care – PPO

## 2013-04-16 ENCOUNTER — Ambulatory Visit (INDEPENDENT_AMBULATORY_CARE_PROVIDER_SITE_OTHER): Payer: BC Managed Care – PPO | Admitting: Cardiology

## 2013-04-16 ENCOUNTER — Encounter: Payer: Self-pay | Admitting: Cardiology

## 2013-04-16 VITALS — BP 124/70 | HR 60 | Ht 71.0 in | Wt 186.0 lb

## 2013-04-16 DIAGNOSIS — E7800 Pure hypercholesterolemia, unspecified: Secondary | ICD-10-CM

## 2013-04-16 DIAGNOSIS — I119 Hypertensive heart disease without heart failure: Secondary | ICD-10-CM

## 2013-04-16 DIAGNOSIS — Z951 Presence of aortocoronary bypass graft: Secondary | ICD-10-CM

## 2013-04-16 DIAGNOSIS — I2510 Atherosclerotic heart disease of native coronary artery without angina pectoris: Secondary | ICD-10-CM

## 2013-04-16 DIAGNOSIS — I10 Essential (primary) hypertension: Secondary | ICD-10-CM

## 2013-04-16 MED ORDER — NITROGLYCERIN 0.4 MG SL SUBL: 0.4000 mg | SUBLINGUAL_TABLET | SUBLINGUAL | Status: DC | PRN

## 2013-04-16 NOTE — Assessment & Plan Note (Signed)
The patient has not been experiencing any recurrent chest pain or angina pectoris.  He carries sublingual nitroglycerin with him but has not had to use it.  We refilled an old bottle for him today

## 2013-04-16 NOTE — Assessment & Plan Note (Signed)
Blood pressure was remaining stable on current therapy.  No headaches dizziness syncope or symptoms of CHF

## 2013-04-16 NOTE — Patient Instructions (Signed)
Your physician recommends that you continue on your current medications as directed. Please refer to the Current Medication list given to you today.  Your physician wants you to follow-up in: 4 months with fasting labs (lp/bmet/hfp) You will receive a reminder letter in the mail two months in advance. If you don't receive a letter, please call our office to schedule the follow-up appointment.  

## 2013-04-16 NOTE — Assessment & Plan Note (Signed)
Lipids are satisfactory on current therapy.  Patient has not had any myalgias from the Lipitor 10 mg daily

## 2013-04-16 NOTE — Progress Notes (Signed)
Zachary Hall Date of Birth:  [birth year] [facility] [address] [place], [place]  [address] [phone]         Fax   [phone]  History of Present Illness: This pleasant 78 year old gentleman is seen for a scheduled 6 month followup office visit. He has a history of known ischemic heart disease. He underwent coronary artery bypass graft surgery on 02/07/10 by Dr. Laneta Simmers. He has been doing well. He is having no recurrent angina. He had been having a problem with epistaxis which has improved since he cut back on his aspirin to just 81 mg every other day. He has also cut back on his use of nasal steroids. He has not been expressing any racing of his heart.  His occasional dizzy spells have improved as he cut back on his lisinopril.  Current Outpatient Prescriptions  Medication Sig Dispense Refill  . amLODipine (NORVASC) 10 MG tablet Take 1 tablet (10 mg total) by mouth daily.  90 tablet  0  . aspirin 81 MG tablet Take 81 mg by mouth. Every Other Day      . atorvastatin (LIPITOR) 10 MG tablet Take 1 tablet (10 mg total) by mouth daily.  90 tablet  3  . Casanthranol-Docusate Sodium 30-100 MG CAPS Take by mouth as needed. Stool Softner      . Cetirizine HCl (ZYRTEC PO) Take 10 mg by mouth daily.       [name] CHERRY PO Take 1 capsule by mouth daily. ** Black Cherry **       . Fluticasone-Salmeterol (ADVAIR DISKUS) 250-50 MCG/DOSE AEPB Inhale 1 puff into the lungs every 12 (twelve) hours. For shortness of breath      . hydrochlorothiazide (MICROZIDE) 12.5 MG capsule TAKE 1 CAPSULE (12.5 MG TOTAL) BY MOUTH DAILY.  90 capsule  3  . lisinopril (PRINIVIL,ZESTRIL) 20 MG tablet Take 1 tablet (20 mg total) by mouth 2 (two) times daily.  180 tablet  3  . multivitamin (THERAGRAN) per tablet Take 1 tablet by mouth daily.        . nitroGLYCERIN (NITROSTAT) 0.4 MG SL tablet Place 1 tablet (0.4 mg total) under the tongue every 5 (five) minutes as needed. For chest pain  25 tablet  11    . Omega-3 Fatty Acids (OMEGA 3 PO) Take 1 capsule by mouth daily.         No current facility-administered medications for this visit.    No Known Allergies  Patient Active Problem List   Diagnosis Date Noted  . Hypertension     Priority: High  . Hx of CABG 12/15/2010    Priority: Medium  . Arm pain 11/21/2010    Priority: Medium  . Prolapsed internal hemorrhoids 09/10/2012  . Hemorrhoids, internal, with bleeding 07/08/2012  . Bradycardia 11/21/2010  . Hypercholesterolemia   . Coronary artery disease   . History of atrial fibrillation   . History of chest pain     History  Smoking status  . Former Smoker  . Types: Cigarettes  . Quit date: 01/08/1960  Smokeless tobacco  . Never Used    Comment: occ alcohol    History  Alcohol Use  . Yes    Family History  Problem Relation Age of Onset  . Alcohol abuse Father   . Colon cancer Mother   . Cancer Mother     colon and bone    Review of Systems: Constitutional: no fever chills diaphoresis or fatigue or change in weight.  Head and neck: no hearing loss, no epistaxis, no photophobia or visual disturbance. Respiratory: No cough, shortness of breath or wheezing. Cardiovascular: No chest pain peripheral edema, palpitations. Gastrointestinal: No abdominal distention, no abdominal pain, no change in bowel habits hematochezia or melena. Genitourinary: No dysuria, no frequency, no urgency, no nocturia. Musculoskeletal:No arthralgias, no back pain, no gait disturbance or myalgias. Neurological: No dizziness, no headaches, no numbness, no seizures, no syncope, no weakness, no tremors. Hematologic: No lymphadenopathy, no easy bruising. Psychiatric: No confusion, no hallucinations, no sleep disturbance.    Physical Exam: Filed Vitals:   04/16/13 1410  BP: 124/70  Pulse: 60   the general appearance reveals a well-developed well-nourished in no distress.The head and neck exam reveals pupils equal and reactive.   Extraocular movements are full.  There is no scleral icterus.  The mouth and pharynx are normal.  The neck is supple.  The carotids reveal no bruits.  The jugular venous pressure is normal.  The  thyroid is not enlarged.  There is no lymphadenopathy.  The chest is clear to percussion and auscultation.  There are no rales or rhonchi.  Expansion of the chest is symmetrical.  The precordium is quiet.  The first heart sound is normal.  The second heart sound is physiologically split.  There is no murmur gallop rub or click.  There is no abnormal lift or heave.  The abdomen is soft and nontender.  The bowel sounds are normal.  The liver and spleen are not enlarged.  There are no abdominal masses.  There are no abdominal bruits.  Extremities reveal good pedal pulses.  There is no phlebitis or edema.  There is no cyanosis or clubbing.  Strength is normal and symmetrical in all extremities.  There is no lateralizing weakness.  There are no sensory deficits.  The skin is warm and dry.  There is no rash.     Assessment / Plan: Continue on same medication.  Continue regular exercise which comprised mostly of yardwork.  Prudent diet.  Recheck in 4 months for office visit lipid panel hepatic function panel and basal metabolic panel.  We will also check an EKG next visit.  No indication for repeat stress test at this time.

## 2013-06-09 ENCOUNTER — Other Ambulatory Visit: Payer: Self-pay | Admitting: Cardiology

## 2013-07-16 ENCOUNTER — Other Ambulatory Visit: Payer: Self-pay | Admitting: Cardiology

## 2013-07-22 ENCOUNTER — Telehealth: Payer: Self-pay | Admitting: Cardiology

## 2013-07-22 NOTE — Telephone Encounter (Signed)
Agree with advice given

## 2013-07-22 NOTE — Telephone Encounter (Signed)
Knot is red and raised. Advised wife patient should call PCP or go to Urgent Care

## 2013-07-22 NOTE — Telephone Encounter (Signed)
New Problem:  Pt's wife states her husband was cleaning the gutters out and the blower fell on his leg on 07/18/13. She states a big knot came up on his left leg around the knee and it's all bruised around his ankle. Pt's wife is concerned it could be a blood clot. She would like a call back.

## 2013-08-20 ENCOUNTER — Other Ambulatory Visit (INDEPENDENT_AMBULATORY_CARE_PROVIDER_SITE_OTHER): Payer: BC Managed Care – PPO

## 2013-08-20 DIAGNOSIS — I119 Hypertensive heart disease without heart failure: Secondary | ICD-10-CM

## 2013-08-20 DIAGNOSIS — E7800 Pure hypercholesterolemia, unspecified: Secondary | ICD-10-CM

## 2013-08-20 LAB — HEPATIC FUNCTION PANEL
ALT: 18 U/L (ref 0–53)
AST: 17 U/L (ref 0–37)
Albumin: 4 g/dL (ref 3.5–5.2)
Alkaline Phosphatase: 53 U/L (ref 39–117)
BILIRUBIN DIRECT: 0 mg/dL (ref 0.0–0.3)
BILIRUBIN TOTAL: 0.7 mg/dL (ref 0.3–1.2)
Total Protein: 6.9 g/dL (ref 6.0–8.3)

## 2013-08-20 LAB — BASIC METABOLIC PANEL
BUN: 12 mg/dL (ref 6–23)
CALCIUM: 8.7 mg/dL (ref 8.4–10.5)
CO2: 27 meq/L (ref 19–32)
Chloride: 101 mEq/L (ref 96–112)
Creatinine, Ser: 1.1 mg/dL (ref 0.4–1.5)
GFR: 72.01 mL/min (ref >60.00)
GLUCOSE: 88 mg/dL (ref 70–99)
Potassium: 3.8 mEq/L (ref 3.5–5.1)
SODIUM: 134 meq/L — AB (ref 135–145)

## 2013-08-20 LAB — LIPID PANEL
CHOL/HDL RATIO: 2
Cholesterol: 148 mg/dL (ref 0–200)
HDL: 59.8 mg/dL (ref >39.00)
LDL Cholesterol: 72 mg/dL (ref 0–99)
Triglycerides: 79 mg/dL (ref 0.0–149.0)
VLDL: 15.8 mg/dL (ref 0.0–40.0)

## 2013-08-20 NOTE — Progress Notes (Signed)
Quick Note:  Please report to patient. The recent labs are stable. Continue same medication and careful diet. ______ 

## 2013-08-27 ENCOUNTER — Other Ambulatory Visit: Payer: BC Managed Care – PPO

## 2013-09-02 ENCOUNTER — Encounter: Payer: Self-pay | Admitting: Cardiology

## 2013-09-02 ENCOUNTER — Ambulatory Visit (INDEPENDENT_AMBULATORY_CARE_PROVIDER_SITE_OTHER): Payer: BC Managed Care – PPO | Admitting: Cardiology

## 2013-09-02 VITALS — BP 129/73 | HR 55 | Ht 71.0 in | Wt 189.8 lb

## 2013-09-02 DIAGNOSIS — I452 Bifascicular block: Secondary | ICD-10-CM | POA: Insufficient documentation

## 2013-09-02 DIAGNOSIS — Z8679 Personal history of other diseases of the circulatory system: Secondary | ICD-10-CM

## 2013-09-02 DIAGNOSIS — Z951 Presence of aortocoronary bypass graft: Secondary | ICD-10-CM

## 2013-09-02 DIAGNOSIS — I4510 Unspecified right bundle-branch block: Secondary | ICD-10-CM | POA: Insufficient documentation

## 2013-09-02 DIAGNOSIS — E7800 Pure hypercholesterolemia, unspecified: Secondary | ICD-10-CM

## 2013-09-02 DIAGNOSIS — I119 Hypertensive heart disease without heart failure: Secondary | ICD-10-CM

## 2013-09-02 NOTE — Assessment & Plan Note (Signed)
The patient has not been aware of any atrial fibrillation or tachycardia arrhythmias or irregular pulse.  No TIA symptoms.

## 2013-09-02 NOTE — Assessment & Plan Note (Signed)
The patient has a history of CABG in 2011.  He has not been experiencing any exertional chest discomfort but has been relatively inactive over the winter.  During the summer months he does a lot of gardening and yard work.  His weight is up 3 pounds since last visit.

## 2013-09-02 NOTE — Assessment & Plan Note (Signed)
The patient now has bifascicular block on his electrocardiogram.  This is new since April 2014.  The patient has not been experiencing any dizzy spells or syncope.  He has not been aware of any palpitations.

## 2013-09-02 NOTE — Patient Instructions (Signed)
Your physician has requested that you have a lexiscan myoview. For further information please visit [email or web address]. Please follow instruction sheet, as given.  Your physician wants you to follow-up in: 4 months with fasting labs (lp/bmet/hfp)  You will receive a reminder letter in the mail two months in advance. If you don't receive a letter, please call our office to schedule the follow-up appointment.   Your physician recommends that you continue on your current medications as directed. Please refer to the Current Medication list given to you today.

## 2013-09-02 NOTE — Progress Notes (Signed)
Zachary Hall Date of Birth:  1935/07/03 [address] [place], [place]  [address] [phone]         Fax   [phone]  History of Present Illness: This pleasant 79 year old gentleman is seen for a scheduled 4 month followup office visit. He has a history of known ischemic heart disease. He underwent coronary artery bypass graft surgery on 02/07/10 by Dr. Cyndia Bent. He has been doing well. He is having no recurrent angina. He had been having a problem with epistaxis which has improved since he cut back on his aspirin to just 81 mg every other day. He has also cut back on his use of nasal steroids. He has not been expressing any racing of his heart.  His occasional dizzy spells have improved as he cut back on his lisinopril.  Since his last office visit his electrocardiogram today shows a new right bundle branch block.  Current Outpatient Prescriptions  Medication Sig Dispense Refill  . amLODipine (NORVASC) 10 MG tablet TAKE 1 TABLET BY MOUTH EVERY DAY  90 tablet  0  . aspirin 81 MG tablet Take 81 mg by mouth. Every Other Day      . atorvastatin (LIPITOR) 10 MG tablet TAKE 1 TABLET (10 MG TOTAL) BY MOUTH DAILY.  90 tablet  0  . Casanthranol-Docusate Sodium 30-100 MG CAPS Take by mouth as needed. Stool Softner      . Cetirizine HCl (ZYRTEC PO) Take 10 mg by mouth daily.       [name] CHERRY PO Take 1 capsule by mouth daily. ** Black Cherry **       . Fluticasone-Salmeterol (ADVAIR DISKUS) 250-50 MCG/DOSE AEPB Inhale 1 puff into the lungs every 12 (twelve) hours as needed (as needed for wheezing). For shortness of breath      . hydrochlorothiazide (MICROZIDE) 12.5 MG capsule TAKE 1 CAPSULE (12.5 MG TOTAL) BY MOUTH DAILY.  90 capsule  3  . lisinopril (PRINIVIL,ZESTRIL) 20 MG tablet Take 1 tablet (20 mg total) by mouth 2 (two) times daily.  180 tablet  3  . multivitamin (THERAGRAN) per tablet Take 1 tablet by mouth daily.        . nitroGLYCERIN (NITROSTAT) 0.4 MG SL tablet Place 1  tablet (0.4 mg total) under the tongue every 5 (five) minutes as needed. For chest pain  25 tablet  11  . Omega-3 Fatty Acids (OMEGA 3 PO) Take 1 capsule by mouth daily.         No current facility-administered medications for this visit.    No Known Allergies  Patient Active Problem List   Diagnosis Date Noted  . Hypertension     Priority: High  . Hx of CABG 12/15/2010    Priority: Medium  . Arm pain 11/21/2010    Priority: Medium  . Bifascicular bundle branch block 09/02/2013  . Prolapsed internal hemorrhoids 09/10/2012  . Hemorrhoids, internal, with bleeding 07/08/2012  . Bradycardia 11/21/2010  . Hypercholesterolemia   . Coronary artery disease   . History of atrial fibrillation   . History of chest pain     History  Smoking status  . Former Smoker  . Types: Cigarettes  . Quit date: 01/08/1960  Smokeless tobacco  . Never Used    Comment: occ alcohol    History  Alcohol Use  . Yes    Family History  Problem Relation Age of Onset  . Alcohol abuse Father   . Colon cancer Mother   . Cancer  Mother     colon and bone    Review of Systems: Constitutional: no fever chills diaphoresis or fatigue or change in weight.  Head and neck: no hearing loss, no epistaxis, no photophobia or visual disturbance. Respiratory: No cough, shortness of breath or wheezing. Cardiovascular: No chest pain peripheral edema, palpitations. Gastrointestinal: No abdominal distention, no abdominal pain, no change in bowel habits hematochezia or melena. Genitourinary: No dysuria, no frequency, no urgency, no nocturia. Musculoskeletal:No arthralgias, no back pain, no gait disturbance or myalgias. Neurological: No dizziness, no headaches, no numbness, no seizures, no syncope, no weakness, no tremors. Hematologic: No lymphadenopathy, no easy bruising. Psychiatric: No confusion, no hallucinations, no sleep disturbance.    Physical Exam: Filed Vitals:   09/02/13 1412  BP: 129/73  Pulse:  55   the general appearance reveals a well-developed well-nourished in no distress.The head and neck exam reveals pupils equal and reactive.  Extraocular movements are full.  There is no scleral icterus.  The mouth and pharynx are normal.  The neck is supple.  The carotids reveal no bruits.  The jugular venous pressure is normal.  The  thyroid is not enlarged.  There is no lymphadenopathy.  The chest is clear to percussion and auscultation.  There are no rales or rhonchi.  Expansion of the chest is symmetrical.  The precordium is quiet.  The first heart sound is normal.  The second heart sound is physiologically split.  There is no murmur gallop rub or click.  There is no abnormal lift or heave.  The abdomen is soft and nontender.  The bowel sounds are normal.  The liver and spleen are not enlarged.  There are no abdominal masses.  There are no abdominal bruits.  Extremities reveal good pedal pulses.  There is no phlebitis or edema.  There is no cyanosis or clubbing.  Strength is normal and symmetrical in all extremities.  There is no lateralizing weakness.  There are no sensory deficits.  The skin is warm and dry.  There is no rash.  EKG today shows sinus bradycardia and left axis deviation and a new complete right bundle branch block with secondary ST-T wave changes.   Assessment / Plan:  Continue same medication.  Return soon for a Lexa scan Myoview stress test because of change in EKG and history of known ischemic heart disease with remote CABG. Recheck in 4 months for office visit lipid panel hepatic function panel and basal metabolic panel.

## 2013-09-05 ENCOUNTER — Other Ambulatory Visit: Payer: Self-pay | Admitting: Cardiology

## 2013-09-17 ENCOUNTER — Encounter (HOSPITAL_COMMUNITY): Payer: BC Managed Care – PPO

## 2013-09-17 ENCOUNTER — Encounter: Payer: Self-pay | Admitting: Cardiology

## 2013-09-17 ENCOUNTER — Ambulatory Visit (HOSPITAL_COMMUNITY): Payer: BC Managed Care – PPO | Attending: Cardiovascular Disease | Admitting: Radiology

## 2013-09-17 ENCOUNTER — Encounter: Payer: Self-pay | Admitting: Cardiovascular Disease

## 2013-09-17 VITALS — BP 140/74 | Ht 71.0 in | Wt 185.0 lb

## 2013-09-17 DIAGNOSIS — I4510 Unspecified right bundle-branch block: Secondary | ICD-10-CM | POA: Insufficient documentation

## 2013-09-17 DIAGNOSIS — I2510 Atherosclerotic heart disease of native coronary artery without angina pectoris: Secondary | ICD-10-CM

## 2013-09-17 DIAGNOSIS — Z951 Presence of aortocoronary bypass graft: Secondary | ICD-10-CM | POA: Insufficient documentation

## 2013-09-17 DIAGNOSIS — Z8679 Personal history of other diseases of the circulatory system: Secondary | ICD-10-CM

## 2013-09-17 DIAGNOSIS — Z87891 Personal history of nicotine dependence: Secondary | ICD-10-CM | POA: Insufficient documentation

## 2013-09-17 DIAGNOSIS — R9431 Abnormal electrocardiogram [ECG] [EKG]: Secondary | ICD-10-CM | POA: Insufficient documentation

## 2013-09-17 DIAGNOSIS — I10 Essential (primary) hypertension: Secondary | ICD-10-CM | POA: Insufficient documentation

## 2013-09-17 DIAGNOSIS — R079 Chest pain, unspecified: Secondary | ICD-10-CM | POA: Insufficient documentation

## 2013-09-17 DIAGNOSIS — Z87898 Personal history of other specified conditions: Secondary | ICD-10-CM

## 2013-09-17 DIAGNOSIS — E785 Hyperlipidemia, unspecified: Secondary | ICD-10-CM | POA: Insufficient documentation

## 2013-09-17 DIAGNOSIS — I4891 Unspecified atrial fibrillation: Secondary | ICD-10-CM | POA: Insufficient documentation

## 2013-09-17 DIAGNOSIS — I4949 Other premature depolarization: Secondary | ICD-10-CM

## 2013-09-17 MED ORDER — TECHNETIUM TC 99M SESTAMIBI GENERIC - CARDIOLITE
33.0000 | Freq: Once | INTRAVENOUS | Status: AC | PRN
  Administered 2013-09-17: 33 via INTRAVENOUS

## 2013-09-17 MED ORDER — REGADENOSON 0.4 MG/5ML IV SOLN
0.4000 mg | Freq: Once | INTRAVENOUS | Status: AC
  Administered 2013-09-17: 0.4 mg via INTRAVENOUS

## 2013-09-17 MED ORDER — TECHNETIUM TC 99M SESTAMIBI GENERIC - CARDIOLITE
10.8000 | Freq: Once | INTRAVENOUS | Status: AC | PRN
  Administered 2013-09-17: 11 via INTRAVENOUS

## 2013-09-17 NOTE — Progress Notes (Signed)
  [facility] [address] [place], [place] [address] [phone]    Cardiology Nuclear Med Study  Zachary Hall is a 79 y.o. male     MRN : [id number]     DOB: Nov 15, 1934  Procedure Date: 09/17/2013  Nuclear Med Background Indication for Stress Test:  Evaluation for Ischemia, Graft Patency and Abnormal EKG:New RBBB and ST changes History:  '11 MPS: EF=59%,Abnormal ischemia>Catheterization>CABG;History of Atrial Fib Cardiac Risk Factors: History of Smoking, Hypertension, Lipids and RBBB  Symptoms:  Chest tenderness in Left and Right chest areas with last episodes two days ago   Nuclear Pre-Procedure Caffeine/Decaff Intake:  None NPO After: 6:30am   Lungs:  clear O2 Sat: 95% on room air. IV 0.9% NS with Angio Cath:  20g  IV Site: R Hand  IV Started by:  Matilde Haymaker, RN  Chest Size (in):  44 Cup Size: n/a  Height: 5\' 11"  (1.803 m)  Weight:  185 lb (83.915 kg)  BMI:  Body mass index is 25.81 kg/(m^2). Tech Comments:  n/a    Nuclear Med Study 1 or 2 day study: 1 day  Stress Test Type:  Treadmill/Lexiscan  Reading MD: n/a  Order Authorizing Provider:  Henrene Dodge  Resting Radionuclide: Technetium 6m Sestamibi  Resting Radionuclide Dose: 11.0 mCi   Stress Radionuclide:  Technetium 26m Sestamibi  Stress Radionuclide Dose: 33.0 mCi           Stress Protocol Rest HR: 51 Stress HR: 92  Rest BP: 140/74 Stress BP: 135/68  Exercise Time (min): 2:00 METS: 1.0   Predicted Max HR: 143 bpm % Max HR: 64.34 bpm Rate Pressure Product: 13432   Dose of Adenosine (mg):  n/a Dose of Lexiscan: 0.4 mg  Dose of Atropine (mg): n/a Dose of Dobutamine: n/a mcg/kg/min (at max HR)  Stress Test Technologist: Matilde Haymaker, RN  Nuclear Technologist:  Charlton Amor, CNMT     Rest Procedure:  Myocardial perfusion imaging was performed at rest 45 minutes following the intravenous administration of Technetium 44m Sestamibi. Rest ECG:  NSR-RBBB,LAFB  Stress Procedure:  The patient received IV Lexiscan 0.4 mg over 15-seconds with concurrent low level exercise and then Technetium 15m Sestamibi was injected at 30-seconds while the patient continued walking one more minute.  Quantitative spect images were obtained after a 45-minute delay. Stress ECG: No significant change from baseline ECG  QPS Raw Data Images:  Normal; no motion artifact; normal heart/lung ratio. Stress Images:  There is decreased uptake in the apex. Rest Images:  There is decreased uptake in the apex. Subtraction (SDS):  No evidence of ischemia. Transient Ischemic Dilatation (Normal <1.22):  1.14 Lung/Heart Ratio (Normal <0.45):  0.33  Quantitative Gated Spect Images QGS EDV:  98 ml QGS ESV:  33 ml  Impression Exercise Capacity:  Lexiscan with no exercise. BP Response:  Normal blood pressure response. Clinical Symptoms:  No significant symptoms noted. ECG Impression:  There are scattered PVCs. Comparison with Prior Nuclear Study: No images to compare  Overall Impression:  Low risk stress nuclear study with no ischemia identified. .  LV Ejection Fraction: 66%.  LV Wall Motion:  NL LV Function; NL Wall Motion  Candee Furbish, MD

## 2013-09-22 ENCOUNTER — Telehealth: Payer: Self-pay | Admitting: *Deleted

## 2013-09-22 NOTE — Telephone Encounter (Signed)
Message copied by Earvin Hansen on Mon Sep 22, 2013  1:07 PM ------      Message from: Darlin Coco      Created: Thu Sep 18, 2013  1:00 PM       The nuclear stress test is normal.  No ischemia.  Ejection fraction was normal at 66%.  Continue same medication. ------

## 2013-09-22 NOTE — Telephone Encounter (Signed)
Advised patient of results.  

## 2013-10-22 ENCOUNTER — Ambulatory Visit: Payer: BC Managed Care – PPO | Admitting: Physician Assistant

## 2013-12-04 ENCOUNTER — Other Ambulatory Visit: Payer: Self-pay | Admitting: Cardiology

## 2013-12-10 ENCOUNTER — Other Ambulatory Visit: Payer: Self-pay | Admitting: Cardiology

## 2013-12-30 ENCOUNTER — Other Ambulatory Visit: Payer: Self-pay

## 2013-12-30 DIAGNOSIS — Z951 Presence of aortocoronary bypass graft: Secondary | ICD-10-CM

## 2013-12-30 MED ORDER — LISINOPRIL 20 MG PO TABS: 20.0000 mg | ORAL_TABLET | Freq: Two times a day (BID) | ORAL | Status: DC

## 2014-01-06 ENCOUNTER — Other Ambulatory Visit (INDEPENDENT_AMBULATORY_CARE_PROVIDER_SITE_OTHER): Payer: BC Managed Care – PPO

## 2014-01-06 DIAGNOSIS — E7800 Pure hypercholesterolemia, unspecified: Secondary | ICD-10-CM

## 2014-01-06 DIAGNOSIS — I119 Hypertensive heart disease without heart failure: Secondary | ICD-10-CM

## 2014-01-06 LAB — BASIC METABOLIC PANEL
BUN: 13 mg/dL (ref 6–23)
CO2: 26 mEq/L (ref 19–32)
Calcium: 9.1 mg/dL (ref 8.4–10.5)
Chloride: 100 mEq/L (ref 96–112)
Creatinine, Ser: 1 mg/dL (ref 0.4–1.5)
GFR: 75.2 mL/min (ref >60.00)
Glucose, Bld: 95 mg/dL (ref 70–99)
Potassium: 4 mEq/L (ref 3.5–5.1)
Sodium: 133 mEq/L — ABNORMAL LOW (ref 135–145)

## 2014-01-06 LAB — LIPID PANEL
Cholesterol: 152 mg/dL (ref 0–200)
HDL: 56.3 mg/dL (ref >39.00)
LDL CALC: 78 mg/dL (ref 0–99)
NONHDL: 95.7
Total CHOL/HDL Ratio: 3
Triglycerides: 91 mg/dL (ref 0.0–149.0)
VLDL: 18.2 mg/dL (ref 0.0–40.0)

## 2014-01-06 LAB — HEPATIC FUNCTION PANEL
ALBUMIN: 4 g/dL (ref 3.5–5.2)
ALT: 19 U/L (ref 0–53)
AST: 18 U/L (ref 0–37)
Alkaline Phosphatase: 49 U/L (ref 39–117)
Bilirubin, Direct: 0.1 mg/dL (ref 0.0–0.3)
Total Bilirubin: 0.9 mg/dL (ref 0.2–1.2)
Total Protein: 6.5 g/dL (ref 6.0–8.3)

## 2014-01-07 NOTE — Progress Notes (Signed)
Quick Note:  Please make copy of labs for patient visit. ______ 

## 2014-01-12 ENCOUNTER — Ambulatory Visit: Payer: BC Managed Care – PPO | Admitting: Cardiology

## 2014-01-13 ENCOUNTER — Ambulatory Visit (INDEPENDENT_AMBULATORY_CARE_PROVIDER_SITE_OTHER): Payer: BC Managed Care – PPO | Admitting: Cardiology

## 2014-01-13 ENCOUNTER — Encounter: Payer: Self-pay | Admitting: Cardiology

## 2014-01-13 VITALS — BP 128/70 | HR 50 | Ht 71.0 in | Wt 187.0 lb

## 2014-01-13 DIAGNOSIS — I2510 Atherosclerotic heart disease of native coronary artery without angina pectoris: Secondary | ICD-10-CM

## 2014-01-13 DIAGNOSIS — I119 Hypertensive heart disease without heart failure: Secondary | ICD-10-CM

## 2014-01-13 DIAGNOSIS — Z951 Presence of aortocoronary bypass graft: Secondary | ICD-10-CM

## 2014-01-13 DIAGNOSIS — E7800 Pure hypercholesterolemia, unspecified: Secondary | ICD-10-CM

## 2014-01-13 DIAGNOSIS — I452 Bifascicular block: Secondary | ICD-10-CM

## 2014-01-13 NOTE — Assessment & Plan Note (Signed)
The patient has not had any symptoms of dizziness or syncope.  No Stokes-Adams attacks.

## 2014-01-13 NOTE — Patient Instructions (Signed)
Your physician recommends that you continue on your current medications as directed. Please refer to the Current Medication list given to you today.   Your physician wants you to follow-up in: 4 months with labs;  You will receive a reminder letter in the mail two months in advance. If you don't receive a letter, please call our office to schedule the follow-up appointment @ [phone].  Your physician recommends that you return for a FASTING lipid profile: on the day of your scheduled appointment.

## 2014-01-13 NOTE — Assessment & Plan Note (Signed)
The patient has not had any recurrent chest pain or angina. 

## 2014-01-13 NOTE — Progress Notes (Signed)
Zachary Hall Date of Birth:  [birth year] [facility] [address] [place], [place]  [address] [phone]  Fax   [phone]  HPI: This pleasant 79 year old gentleman is seen for a scheduled 4 month followup office visit. He has a history of known ischemic heart disease. He underwent coronary artery bypass graft surgery on 02/07/10 by Dr. Cyndia Bent. He has been doing well. He is having no recurrent angina. He had been having a problem with epistaxis which has improved since he cut back on his aspirin to just 81 mg every other day. He has also cut back on his use of nasal steroids. He has not been expressing any racing of his heart. His occasional dizzy spells have improved as he cut back on his lisinopril.  His last electrocardiogram  shows a new right bundle branch block.   Current Outpatient Prescriptions  Medication Sig Dispense Refill  . amLODipine (NORVASC) 10 MG tablet TAKE 1 TABLET BY MOUTH EVERY DAY  90 tablet  0  . aspirin 81 MG tablet Take 81 mg by mouth. Every Other Day      . atorvastatin (LIPITOR) 10 MG tablet TAKE 1 TABLET BY MOUTH DAILY  90 tablet  0  . Casanthranol-Docusate Sodium 30-100 MG CAPS Take by mouth as needed. Stool Softner      . Cetirizine HCl (ZYRTEC PO) Take 10 mg by mouth daily.       [name] CHERRY PO Take 1 capsule by mouth daily. ** Black Cherry **       . Fluticasone-Salmeterol (ADVAIR DISKUS) 250-50 MCG/DOSE AEPB Inhale 1 puff into the lungs every 12 (twelve) hours as needed (as needed for wheezing). For shortness of breath      . hydrochlorothiazide (MICROZIDE) 12.5 MG capsule TAKE 1 CAPSULE (12.5 MG TOTAL) BY MOUTH DAILY.  90 capsule  3  . lisinopril (PRINIVIL,ZESTRIL) 20 MG tablet Take 1 tablet (20 mg total) by mouth 2 (two) times daily.  180 tablet  3  . multivitamin (THERAGRAN) per tablet Take 1 tablet by mouth daily.        . nitroGLYCERIN (NITROSTAT) 0.4 MG SL tablet Place 1 tablet (0.4 mg total) under the tongue every 5 (five)  minutes as needed. For chest pain  25 tablet  11  . Omega-3 Fatty Acids (OMEGA 3 PO) Take 1 capsule by mouth daily.         No current facility-administered medications for this visit.    No Known Allergies  Patient Active Problem List   Diagnosis Date Noted  . Hypertension     Priority: High  . Hx of CABG 12/15/2010    Priority: Medium  . Arm pain 11/21/2010    Priority: Medium  . Bifascicular bundle branch block 09/02/2013  . Prolapsed internal hemorrhoids 09/10/2012  . Hemorrhoids, internal, with bleeding 07/08/2012  . Bradycardia 11/21/2010  . Hypercholesterolemia   . Coronary artery disease   . History of atrial fibrillation   . History of chest pain     History  Smoking status  . Former Smoker  . Types: Cigarettes  . Quit date: 01/08/1960  Smokeless tobacco  . Never Used    Comment: occ alcohol    History  Alcohol Use  . Yes    Family History  Problem Relation Age of Onset  . Alcohol abuse Father   . Colon cancer Mother   . Cancer Mother     colon and bone    Review of Systems:  The patient denies any heat or cold intolerance.  No weight gain or weight loss.  The patient denies headaches or blurry vision.  There is no cough or sputum production.  The patient denies dizziness.  There is no hematuria or hematochezia.  The patient denies any muscle aches or arthritis.  The patient denies any rash.  The patient denies frequent falling or instability.  There is no history of depression or anxiety.  All other systems were reviewed and are negative.   Physical Exam: Filed Vitals:   01/13/14 1109  BP: 128/70  Pulse: 50   the general appearance reveals a well-developed well-nourished gentleman in no distress.The head and neck exam reveals pupils equal and reactive.  Extraocular movements are full.  There is no scleral icterus.  The mouth and pharynx are normal.  The neck is supple.  The carotids reveal no bruits.  The jugular venous pressure is normal.  The   thyroid is not enlarged.  There is no lymphadenopathy.  The chest is clear to percussion and auscultation.  There are no rales or rhonchi.  Expansion of the chest is symmetrical.  The precordium is quiet.  The first heart sound is normal.  The second heart sound is physiologically split.  There is no murmur gallop rub or click.  There is no abnormal lift or heave.  The abdomen is soft and nontender.  The bowel sounds are normal.  The liver and spleen are not enlarged.  There are no abdominal masses.  There are no abdominal bruits.  Extremities reveal good pedal pulses.  There is no phlebitis or edema.  There is no cyanosis or clubbing.  Strength is normal and symmetrical in all extremities.  There is no lateralizing weakness.  There are no sensory deficits.  The skin is warm and dry.  There is no rash.     Assessment / Plan: 1.  Ischemic heart disease status post CABG. 2.  Bifascicular block 3. Hypercholesterolemia 4. remote history of paroxysmal atrial fibrillation 5. benign hypertensive heart disease without heart failure  Plan: Continue same medication.  Continue heart healthy diet.  Recheck in 4 months for office visit lipid panel hepatic function panel and basal metabolic panel.

## 2014-01-13 NOTE — Assessment & Plan Note (Signed)
The patient's lipids are having a satisfactory response to low dose atorvastatin 10 mg daily.  He is not having any side effects from the statin therapy.

## 2014-02-05 ENCOUNTER — Other Ambulatory Visit: Payer: Self-pay | Admitting: *Deleted

## 2014-02-05 MED ORDER — HYDROCHLOROTHIAZIDE 12.5 MG PO CAPS: ORAL_CAPSULE | ORAL | Status: DC

## 2014-03-04 ENCOUNTER — Other Ambulatory Visit: Payer: Self-pay | Admitting: Cardiology

## 2014-03-22 ENCOUNTER — Other Ambulatory Visit: Payer: Self-pay | Admitting: Cardiology

## 2014-03-26 ENCOUNTER — Other Ambulatory Visit: Payer: Self-pay | Admitting: Cardiology

## 2014-04-06 ENCOUNTER — Other Ambulatory Visit: Payer: Self-pay | Admitting: Cardiology

## 2014-04-23 ENCOUNTER — Other Ambulatory Visit: Payer: Self-pay | Admitting: Cardiology

## 2014-04-23 ENCOUNTER — Other Ambulatory Visit: Payer: Self-pay | Admitting: *Deleted

## 2014-05-04 DIAGNOSIS — Z87898 Personal history of other specified conditions: Secondary | ICD-10-CM | POA: Insufficient documentation

## 2014-05-04 DIAGNOSIS — I2510 Atherosclerotic heart disease of native coronary artery without angina pectoris: Secondary | ICD-10-CM | POA: Insufficient documentation

## 2014-05-12 ENCOUNTER — Other Ambulatory Visit (INDEPENDENT_AMBULATORY_CARE_PROVIDER_SITE_OTHER): Payer: BC Managed Care – PPO | Admitting: *Deleted

## 2014-05-12 ENCOUNTER — Other Ambulatory Visit: Payer: Self-pay | Admitting: Cardiology

## 2014-05-12 DIAGNOSIS — I119 Hypertensive heart disease without heart failure: Secondary | ICD-10-CM

## 2014-05-12 DIAGNOSIS — Z951 Presence of aortocoronary bypass graft: Secondary | ICD-10-CM

## 2014-05-12 DIAGNOSIS — E7800 Pure hypercholesterolemia, unspecified: Secondary | ICD-10-CM

## 2014-05-12 DIAGNOSIS — I452 Bifascicular block: Secondary | ICD-10-CM

## 2014-05-12 LAB — BASIC METABOLIC PANEL
BUN: 14 mg/dL (ref 6–23)
CO2: 27 mEq/L (ref 19–32)
CREATININE: 1.1 mg/dL (ref 0.4–1.5)
Calcium: 8.9 mg/dL (ref 8.4–10.5)
Chloride: 101 mEq/L (ref 96–112)
GFR: 72.66 mL/min (ref >60.00)
Glucose, Bld: 90 mg/dL (ref 70–99)
Potassium: 3.9 mEq/L (ref 3.5–5.1)
Sodium: 135 mEq/L (ref 135–145)

## 2014-05-12 LAB — HEPATIC FUNCTION PANEL
ALK PHOS: 45 U/L (ref 39–117)
ALT: 15 U/L (ref 0–53)
AST: 16 U/L (ref 0–37)
Albumin: 3.6 g/dL (ref 3.5–5.2)
BILIRUBIN TOTAL: 0.7 mg/dL (ref 0.2–1.2)
Bilirubin, Direct: 0.1 mg/dL (ref 0.0–0.3)
TOTAL PROTEIN: 7.1 g/dL (ref 6.0–8.3)

## 2014-05-12 LAB — LIPID PANEL
Cholesterol: 155 mg/dL (ref 0–200)
HDL: 45.6 mg/dL (ref >39.00)
LDL Cholesterol: 84 mg/dL (ref 0–99)
NonHDL: 109.4
TRIGLYCERIDES: 126 mg/dL (ref 0.0–149.0)
Total CHOL/HDL Ratio: 3
VLDL: 25.2 mg/dL (ref 0.0–40.0)

## 2014-05-12 NOTE — Progress Notes (Signed)
Quick Note:  Please make copy of labs for patient visit. ______ 

## 2014-05-14 DIAGNOSIS — I872 Venous insufficiency (chronic) (peripheral): Secondary | ICD-10-CM | POA: Insufficient documentation

## 2014-05-18 ENCOUNTER — Ambulatory Visit (INDEPENDENT_AMBULATORY_CARE_PROVIDER_SITE_OTHER): Payer: BC Managed Care – PPO | Admitting: Cardiology

## 2014-05-18 ENCOUNTER — Encounter: Payer: Self-pay | Admitting: Cardiology

## 2014-05-18 VITALS — BP 126/52 | HR 60 | Ht 74.0 in | Wt 187.0 lb

## 2014-05-18 DIAGNOSIS — E7800 Pure hypercholesterolemia, unspecified: Secondary | ICD-10-CM

## 2014-05-18 DIAGNOSIS — I452 Bifascicular block: Secondary | ICD-10-CM

## 2014-05-18 DIAGNOSIS — I2510 Atherosclerotic heart disease of native coronary artery without angina pectoris: Secondary | ICD-10-CM

## 2014-05-18 NOTE — Patient Instructions (Signed)
Your physician recommends that you continue on your current medications as directed. Please refer to the Current Medication list given to you today.  Your physician wants you to follow-up in: 4 months with fasting labs (lp/bmet/hfp) You will receive a reminder letter in the mail two months in advance. If you don't receive a letter, please call our office to schedule the follow-up appointment.  

## 2014-05-18 NOTE — Assessment & Plan Note (Signed)
The patient has not been experiencing any further dizziness or syncope.

## 2014-05-18 NOTE — Assessment & Plan Note (Signed)
No chest pain or angina pectoris

## 2014-05-18 NOTE — Progress Notes (Signed)
Lynne Logan Date of Birth:  23-Apr-1935 [facility] [address] [place], [place]  [address] [phone]  Fax   [phone]  HPI: This pleasant 79 year old gentleman is seen for a scheduled 4 month followup office visit. He has a history of known ischemic heart disease. He underwent coronary artery bypass graft surgery on 02/07/10 by Dr. Cyndia Bent. He has been doing well. He is having no recurrent angina.  He has not been expressing any racing of his heart. His occasional dizzy spells have improved as he cut back on his lisinopril.  His last electrocardiogram  shows a new right bundle branch block.   Current Outpatient Prescriptions  Medication Sig Dispense Refill  . amLODipine (NORVASC) 10 MG tablet TAKE 1 TABLET BY MOUTH EVERY DAY  90 tablet  0  . aspirin 81 MG tablet Take 81 mg by mouth. Every Other Day      . atorvastatin (LIPITOR) 10 MG tablet TAKE 1 TABLET BY MOUTH DAILY  30 tablet  0  . Casanthranol-Docusate Sodium 30-100 MG CAPS Take by mouth as needed. Stool Softner      . Cetirizine HCl (ZYRTEC PO) Take 10 mg by mouth daily.       [name] CHERRY PO Take 1 capsule by mouth daily. ** Black Cherry **       . fluocinonide cream (LIDEX) 0.05 %       . Fluticasone-Salmeterol (ADVAIR DISKUS) 250-50 MCG/DOSE AEPB Inhale 1 puff into the lungs every 12 (twelve) hours as needed (as needed for wheezing). For shortness of breath      . hydrochlorothiazide (MICROZIDE) 12.5 MG capsule TAKE 1 CAPSULE (12.5 MG TOTAL) BY MOUTH DAILY.  90 capsule  1  . lisinopril (PRINIVIL,ZESTRIL) 20 MG tablet Take 1 tablet (20 mg total) by mouth 2 (two) times daily.  180 tablet  3  . multivitamin (THERAGRAN) per tablet Take 1 tablet by mouth daily.        [name] NITROSTAT 0.4 MG SL tablet PLACE 1 TABLET (0.4 MG TOTAL) UNDER THE TONGUE EVERY 5 (FIVE) MINUTES AS NEEDED. FOR CHEST PAIN  25 tablet  1  . Omega-3 Fatty Acids (OMEGA 3 PO) Take 1 capsule by mouth daily.         No current  facility-administered medications for this visit.    No Known Allergies  Patient Active Problem List   Diagnosis Date Noted  . Hypertension     Priority: High  . Hx of CABG 12/15/2010    Priority: Medium  . Arm pain 11/21/2010    Priority: Medium  . Bifascicular bundle branch block 09/02/2013  . Prolapsed internal hemorrhoids 09/10/2012  . Hemorrhoids, internal, with bleeding 07/08/2012  . Bradycardia 11/21/2010  . Hypercholesterolemia   . Coronary artery disease   . History of atrial fibrillation   . History of chest pain     History  Smoking status  . Former Smoker  . Types: Cigarettes  . Quit date: 01/08/1960  Smokeless tobacco  . Never Used    Comment: occ alcohol    History  Alcohol Use  . Yes    Family History  Problem Relation Age of Onset  . Alcohol abuse Father   . Colon cancer Mother   . Cancer Mother     colon and bone    Review of Systems: The patient denies any heat or cold intolerance.  No weight gain or weight loss.  The patient denies headaches or blurry vision.  There is no cough or sputum production.  The patient denies dizziness.  There is no hematuria or hematochezia.  The patient denies any muscle aches or arthritis.  The patient denies any rash.  The patient denies frequent falling or instability.  There is no history of depression or anxiety.  All other systems were reviewed and are negative.   Physical Exam: Filed Vitals:   05/18/14 1440  BP: 126/52  Pulse: 60   the general appearance reveals a well-developed well-nourished gentleman in no distress.The head and neck exam reveals pupils equal and reactive.  Extraocular movements are full.  There is no scleral icterus.  The mouth and pharynx are normal.  The neck is supple.  The carotids reveal no bruits.  The jugular venous pressure is normal.  The  thyroid is not enlarged.  There is no lymphadenopathy.  The chest is clear to percussion and auscultation.  There are no rales or rhonchi.   Expansion of the chest is symmetrical.  The precordium is quiet.  The first heart sound is normal.  The second heart sound is physiologically split.  There is no murmur gallop rub or click.  There is no abnormal lift or heave.  The abdomen is soft and nontender.  The bowel sounds are normal.  The liver and spleen are not enlarged.  There are no abdominal masses.  There are no abdominal bruits.  Extremities reveal good pedal pulses.  There is no phlebitis or edema.  There is no cyanosis or clubbing.  Strength is normal and symmetrical in all extremities.  There is no lateralizing weakness.  There are no sensory deficits.  The skin is warm and dry.  There is no rash.     Assessment / Plan: 1.  Ischemic heart disease status post CABG. 2.  Bifascicular block 3. Hypercholesterolemia 4. remote history of paroxysmal atrial fibrillation 5. benign hypertensive heart disease without heart failure  Plan: Continue same medication.  Continue heart healthy diet.  Recheck in 4 months for office visit lipid panel hepatic function panel and basal metabolic panel.  We gave him a prescription at his request for Prevnar 13 vaccine which he will obtain from his local pharmacy

## 2014-05-18 NOTE — Assessment & Plan Note (Signed)
Lipids are stable on current dose of Lipitor.  No myalgias.

## 2014-05-26 ENCOUNTER — Other Ambulatory Visit: Payer: Self-pay | Admitting: Cardiology

## 2014-06-22 ENCOUNTER — Other Ambulatory Visit: Payer: Self-pay | Admitting: Cardiology

## 2014-06-24 NOTE — Telephone Encounter (Signed)
Darlin Coco, MD at 05/18/2014 4:52 PM  amLODipine (NORVASC) 10 MG tablet TAKE 1 TABLET BY MOUTH EVERY DAY   Patient Instructions     Your physician recommends that you continue on your current medications as directed. Please refer to the Current Medication list given to you today.  Your physician wants you to follow-up in: 4 months with fasting labs (lp/bmet/hfp) You will receive a reminder letter in the mail two months in advance. If you don't receive a letter, please call our office to schedule the follow-up appointment.

## 2014-09-11 ENCOUNTER — Other Ambulatory Visit (INDEPENDENT_AMBULATORY_CARE_PROVIDER_SITE_OTHER): Payer: BC Managed Care – PPO | Admitting: *Deleted

## 2014-09-11 DIAGNOSIS — E7800 Pure hypercholesterolemia, unspecified: Secondary | ICD-10-CM

## 2014-09-11 LAB — HEPATIC FUNCTION PANEL
ALK PHOS: 51 U/L (ref 39–117)
ALT: 16 U/L (ref 0–53)
AST: 13 U/L (ref 0–37)
Albumin: 4.1 g/dL (ref 3.5–5.2)
BILIRUBIN DIRECT: 0.1 mg/dL (ref 0.0–0.3)
BILIRUBIN TOTAL: 0.8 mg/dL (ref 0.2–1.2)
Total Protein: 6.9 g/dL (ref 6.0–8.3)

## 2014-09-11 LAB — BASIC METABOLIC PANEL
BUN: 16 mg/dL (ref 6–23)
CHLORIDE: 101 meq/L (ref 96–112)
CO2: 31 meq/L (ref 19–32)
CREATININE: 1.12 mg/dL (ref 0.40–1.50)
Calcium: 9.2 mg/dL (ref 8.4–10.5)
GFR: 67.39 mL/min (ref >60.00)
Glucose, Bld: 97 mg/dL (ref 70–99)
POTASSIUM: 4.1 meq/L (ref 3.5–5.1)
Sodium: 136 mEq/L (ref 135–145)

## 2014-09-11 LAB — LIPID PANEL
CHOLESTEROL: 153 mg/dL (ref 0–200)
HDL: 57.3 mg/dL (ref >39.00)
LDL CALC: 76 mg/dL (ref 0–99)
NonHDL: 95.7
TRIGLYCERIDES: 99 mg/dL (ref 0.0–149.0)
Total CHOL/HDL Ratio: 3
VLDL: 19.8 mg/dL (ref 0.0–40.0)

## 2014-09-11 NOTE — Progress Notes (Signed)
Quick Note:  Please make copy of labs for patient visit. ______ 

## 2014-09-18 ENCOUNTER — Ambulatory Visit: Payer: BC Managed Care – PPO | Admitting: Cardiology

## 2014-09-22 ENCOUNTER — Encounter: Payer: Self-pay | Admitting: Cardiology

## 2014-09-22 ENCOUNTER — Ambulatory Visit (INDEPENDENT_AMBULATORY_CARE_PROVIDER_SITE_OTHER): Payer: BLUE CROSS/BLUE SHIELD | Admitting: Cardiology

## 2014-09-22 VITALS — BP 120/70 | HR 56 | Ht 74.0 in | Wt 189.8 lb

## 2014-09-22 DIAGNOSIS — I119 Hypertensive heart disease without heart failure: Secondary | ICD-10-CM

## 2014-09-22 DIAGNOSIS — E7800 Pure hypercholesterolemia, unspecified: Secondary | ICD-10-CM

## 2014-09-22 DIAGNOSIS — I452 Bifascicular block: Secondary | ICD-10-CM

## 2014-09-22 DIAGNOSIS — R001 Bradycardia, unspecified: Secondary | ICD-10-CM

## 2014-09-22 DIAGNOSIS — I10 Essential (primary) hypertension: Secondary | ICD-10-CM

## 2014-09-22 DIAGNOSIS — Z951 Presence of aortocoronary bypass graft: Secondary | ICD-10-CM

## 2014-09-22 NOTE — Patient Instructions (Signed)
Your physician recommends that you continue on your current medications as directed. Please refer to the Current Medication list given to you today.  Your physician recommends that you schedule a follow-up appointment in: 4 months with fasting labs (lp/bmet/hfp/cbc)  

## 2014-09-22 NOTE — Progress Notes (Signed)
Cardiology Office Note   Date:  09/22/2014   ID:  Zachary Hall, DOB Dec 14, 1934, MRN [id number]  PCP:  Miguel Aschoff, MD  Cardiologist:    Darlin Coco, MD   No chief complaint on file.     History of Present Illness: Zachary Hall is a 80 y.o. male who presents for follow-up office visit  This pleasant 80 year old gentleman is seen for a scheduled 4 month followup office visit. He has a history of known ischemic heart disease. He underwent coronary artery bypass graft surgery on 02/07/10 by Dr. Cyndia Bent. He has been doing well. He is having no recurrent angina. He has not been expressing any racing of his heart. His occasional dizzy spells have improved as he cut back on his lisinopril.  He has occasional chest tightness which appears to be musculoskeletal rather than cardiac. His last electrocardiogram shows a  right bundle branch block today which was also seen on his last EKG.  Past Medical History  Diagnosis Date  . Hypertension   . Hypercholesterolemia   . Coronary artery disease   . Nocturia   . History of atrial fibrillation     Previously on amiodarone  . Bradycardia   . Hx of CABG   . Gout   . Myocardial infarction     Past Surgical History  Procedure Laterality Date  . Cardiac catheterization  01/04/2010  . Coronary artery bypass graft  02/07/2010    LIMA to LAD, SVG to 2nd DX, SVG to OM  . Nasal polyp excision    . Hemorrhoid surgery      x 2  . Eye surgery      cat  . Hemorrhoid surgery N/A 11/13/2012    Procedure: [place] Hemorrhoidectomy;  Surgeon: Merrie Roof, MD;  Location: [facility];  Service: General;  Laterality: N/A;     Current Outpatient Prescriptions  Medication Sig Dispense Refill  . amLODipine (NORVASC) 10 MG tablet TAKE 1 TABLET BY MOUTH EVERY DAY 90 tablet 2  . aspirin 81 MG tablet Take 81 mg by mouth. Every Other Day    . atorvastatin (LIPITOR) 10 MG tablet TAKE 1 TABLET BY MOUTH DAILY 30 tablet 5  . Casanthranol-Docusate  Sodium 30-100 MG CAPS Take by mouth as needed. Stool Softner    . Cetirizine HCl (ZYRTEC PO) Take 10 mg by mouth daily.     [name] CHERRY PO Take 1 capsule by mouth daily. ** Black Cherry **     . fluocinonide cream (LIDEX) 6.75 % Apply 1 application topically as needed.     . Fluticasone-Salmeterol (ADVAIR DISKUS) 250-50 MCG/DOSE AEPB Inhale 1 puff into the lungs every 12 (twelve) hours as needed (as needed for wheezing). For shortness of breath    . hydrochlorothiazide (MICROZIDE) 12.5 MG capsule TAKE 1 CAPSULE (12.5 MG TOTAL) BY MOUTH DAILY. 90 capsule 1  . lisinopril (PRINIVIL,ZESTRIL) 20 MG tablet Take 1 tablet (20 mg total) by mouth 2 (two) times daily. 180 tablet 3  . multivitamin (THERAGRAN) per tablet Take 1 tablet by mouth daily.      [name] NITROSTAT 0.4 MG SL tablet PLACE 1 TABLET (0.4 MG TOTAL) UNDER THE TONGUE EVERY 5 (FIVE) MINUTES AS NEEDED. FOR CHEST PAIN 25 tablet 1  . Omega-3 Fatty Acids (OMEGA 3 PO) Take 1 capsule by mouth daily.       No current facility-administered medications for this visit.    Allergies:   Review of patient's allergies indicates no known allergies.  Social History:  The patient  reports that he quit smoking about 54 years ago. His smoking use included Cigarettes. He has never used smokeless tobacco. He reports that he drinks alcohol. He reports that he does not use illicit drugs.   Family History:  The patient's family history includes Alcohol abuse in his father; Cancer in his mother; Colon cancer in his mother.    ROS:  Please see the history of present illness.   Otherwise, review of systems are positive for none.   All other systems are reviewed and negative.    PHYSICAL EXAM: VS:  BP 120/70 mmHg  Pulse 56  Ht 6\' 2"  (1.88 m)  Wt 189 lb 12.8 oz (86.093 kg)  BMI 24.36 kg/m2 , BMI Body mass index is 24.36 kg/(m^2). GEN: Well nourished, well developed, in no acute distress HEENT: normal Neck: no JVD, carotid bruits, or masses Cardiac: RRR; no  murmurs, rubs, or gallops,no edema  Respiratory:  clear to auscultation bilaterally, normal work of breathing.  He has some point tenderness along the left costochondral margin. GI: soft, nontender, nondistended, + BS MS: no deformity or atrophy Skin: warm and dry, no rash Neuro:  Strength and sensation are intact Psych: euthymic mood, full affect   EKG:  EKG is ordered today. The ekg ordered today demonstrates normal sinus rhythm with right bundle branch block and left anterior hemiblock.  No change since prior tracing of 09/02/13   Recent Labs: 09/11/2014: ALT 16; BUN 16; Creatinine 1.12; Potassium 4.1; Sodium 136    Lipid Panel    Component Value Date/Time   CHOL 153 09/11/2014 1007   TRIG 99.0 09/11/2014 1007   HDL 57.30 09/11/2014 1007   CHOLHDL 3 09/11/2014 1007   VLDL 19.8 09/11/2014 1007   LDLCALC 76 09/11/2014 1007      Wt Readings from Last 3 Encounters:  09/22/14 189 lb 12.8 oz (86.093 kg)  05/18/14 187 lb (84.823 kg)  01/13/14 187 lb (84.823 kg)      Other studies Reviewed: Additional studies/ records that were reviewed today include: Recent lab work which is satisfactory.    ASSESSMENT AND PLAN:  1. Ischemic heart disease status post CABG. 2. Bifascicular block 3. Hypercholesterolemia 4. remote history of paroxysmal atrial fibrillation 5. benign hypertensive heart disease without heart failure 6.  Left chest discomfort felt to be musculoskeletal   Current medicines are reviewed at length with the patient today.  The patient does not have concerns regarding medicines.  The following changes have been made:  no change  Labs/ tests ordered today include:   Orders Placed This Encounter  Procedures  . CBC with Differential/Platelet  . Lipid panel  . Hepatic function panel  . Basic metabolic panel  . EKG 12-Lead     Disposition:   FU with Dr. [name] in 4 months for office visit and fasting lab work and CBC   Signed, Darlin Coco, MD    09/22/2014 12:52 PM    [facility], [place], [place]  [address] Phone: [phone]; Fax: [phone]

## 2014-11-09 ENCOUNTER — Other Ambulatory Visit: Payer: Self-pay | Admitting: Cardiology

## 2014-12-21 ENCOUNTER — Other Ambulatory Visit: Payer: Self-pay | Admitting: Cardiology

## 2014-12-29 ENCOUNTER — Other Ambulatory Visit: Payer: Self-pay | Admitting: Cardiology

## 2014-12-31 ENCOUNTER — Other Ambulatory Visit: Payer: Self-pay | Admitting: Cardiology

## 2015-01-13 ENCOUNTER — Other Ambulatory Visit (INDEPENDENT_AMBULATORY_CARE_PROVIDER_SITE_OTHER): Payer: BLUE CROSS/BLUE SHIELD | Admitting: *Deleted

## 2015-01-13 DIAGNOSIS — E7800 Pure hypercholesterolemia, unspecified: Secondary | ICD-10-CM

## 2015-01-13 DIAGNOSIS — I119 Hypertensive heart disease without heart failure: Secondary | ICD-10-CM

## 2015-01-13 LAB — CBC WITH DIFFERENTIAL/PLATELET
BASOS ABS: 0 10*3/uL (ref 0.0–0.1)
Basophils Relative: 0.5 % (ref 0.0–3.0)
EOS PCT: 3.6 % (ref 0.0–5.0)
Eosinophils Absolute: 0.3 10*3/uL (ref 0.0–0.7)
HEMATOCRIT: 38.9 % — AB (ref 39.0–52.0)
HEMOGLOBIN: 13.6 g/dL (ref 13.0–17.0)
LYMPHS ABS: 1.9 10*3/uL (ref 0.7–4.0)
Lymphocytes Relative: 25.9 % (ref 12.0–46.0)
MCHC: 34.9 g/dL (ref 30.0–36.0)
MCV: 97.5 fl (ref 78.0–100.0)
Monocytes Absolute: 0.5 10*3/uL (ref 0.1–1.0)
Monocytes Relative: 6.5 % (ref 3.0–12.0)
Neutro Abs: 4.8 10*3/uL (ref 1.4–7.7)
Neutrophils Relative %: 63.5 % (ref 43.0–77.0)
Platelets: 206 10*3/uL (ref 150.0–400.0)
RBC: 3.99 Mil/uL — ABNORMAL LOW (ref 4.22–5.81)
RDW: 12.9 % (ref 11.5–15.5)
WBC: 7.5 10*3/uL (ref 4.0–10.5)

## 2015-01-13 LAB — BASIC METABOLIC PANEL
BUN: 14 mg/dL (ref 6–23)
CO2: 29 meq/L (ref 19–32)
CREATININE: 1.06 mg/dL (ref 0.40–1.50)
Calcium: 9.1 mg/dL (ref 8.4–10.5)
Chloride: 99 mEq/L (ref 96–112)
GFR: 71.75 mL/min (ref >60.00)
GLUCOSE: 99 mg/dL (ref 70–99)
POTASSIUM: 3.8 meq/L (ref 3.5–5.1)
SODIUM: 132 meq/L — AB (ref 135–145)

## 2015-01-13 LAB — HEPATIC FUNCTION PANEL
ALT: 13 U/L (ref 0–53)
AST: 15 U/L (ref 0–37)
Albumin: 4.2 g/dL (ref 3.5–5.2)
Alkaline Phosphatase: 47 U/L (ref 39–117)
BILIRUBIN DIRECT: 0.2 mg/dL (ref 0.0–0.3)
BILIRUBIN TOTAL: 0.6 mg/dL (ref 0.2–1.2)
Total Protein: 6.5 g/dL (ref 6.0–8.3)

## 2015-01-13 LAB — LIPID PANEL
Cholesterol: 146 mg/dL (ref 0–200)
HDL: 56.4 mg/dL (ref >39.00)
LDL CALC: 68 mg/dL (ref 0–99)
NonHDL: 89.6
TRIGLYCERIDES: 109 mg/dL (ref 0.0–149.0)
Total CHOL/HDL Ratio: 3
VLDL: 21.8 mg/dL (ref 0.0–40.0)

## 2015-01-13 NOTE — Progress Notes (Signed)
Quick Note:  Please make copy of labs for patient visit. ______ 

## 2015-01-20 ENCOUNTER — Encounter: Payer: Self-pay | Admitting: Cardiology

## 2015-01-20 ENCOUNTER — Ambulatory Visit (INDEPENDENT_AMBULATORY_CARE_PROVIDER_SITE_OTHER): Payer: BLUE CROSS/BLUE SHIELD | Admitting: Cardiology

## 2015-01-20 VITALS — BP 134/60 | HR 48 | Ht 71.0 in | Wt 184.8 lb

## 2015-01-20 DIAGNOSIS — E7800 Pure hypercholesterolemia, unspecified: Secondary | ICD-10-CM

## 2015-01-20 DIAGNOSIS — I119 Hypertensive heart disease without heart failure: Secondary | ICD-10-CM | POA: Diagnosis not present

## 2015-01-20 DIAGNOSIS — Z951 Presence of aortocoronary bypass graft: Secondary | ICD-10-CM | POA: Diagnosis not present

## 2015-01-20 DIAGNOSIS — I452 Bifascicular block: Secondary | ICD-10-CM | POA: Diagnosis not present

## 2015-01-20 MED ORDER — ATORVASTATIN CALCIUM 10 MG PO TABS: 10.0000 mg | ORAL_TABLET | Freq: Every day | ORAL | Status: DC

## 2015-01-20 MED ORDER — LOSARTAN POTASSIUM 50 MG PO TABS: 50.0000 mg | ORAL_TABLET | Freq: Every day | ORAL | Status: DC

## 2015-01-20 MED ORDER — HYDROCHLOROTHIAZIDE 12.5 MG PO CAPS: ORAL_CAPSULE | ORAL | Status: DC

## 2015-01-20 MED ORDER — NITROGLYCERIN 0.4 MG SL SUBL: SUBLINGUAL_TABLET | SUBLINGUAL | Status: DC

## 2015-01-20 MED ORDER — AMLODIPINE BESYLATE 10 MG PO TABS: 10.0000 mg | ORAL_TABLET | Freq: Every day | ORAL | Status: DC

## 2015-01-20 NOTE — Progress Notes (Signed)
Cardiology Office Note   Date:  01/20/2015   ID:  Zachary Hall, DOB November 10, 1934, MRN [id number]  PCP:  Wilhemena Durie, MD  Cardiologist: Darlin Coco MD  No chief complaint on file.     History of Present Illness: Zachary Hall is a 80 y.o. male who presents for four-month follow-up office visit.  This pleasant 80 year old gentleman is seen for a scheduled 4 month followup office visit. He has a history of known ischemic heart disease. He underwent coronary artery bypass graft surgery on 02/07/10 by Dr. Cyndia Bent. He has been doing well. He is having no recurrent angina. He has not been expressing any racing of his heart. His occasional dizzy spells have improved as he cut back on his lisinopril. He has occasional chest tightness which appears to be musculoskeletal rather than cardiac. His last electrocardiogram shows a right bundle branch block today which was also seen on his last EKG. he has had a chronic cough which may be secondary to lisinopril.  He has to clear his throat a lot.  He has not produced any significant amount of mucus.  We will switch him to losartan and stop lisinopril. He indicates that he is having some problems with his memory.  Past Medical History  Diagnosis Date  . Hypertension   . Hypercholesterolemia   . Coronary artery disease   . Nocturia   . History of atrial fibrillation     Previously on amiodarone  . Bradycardia   . Hx of CABG   . Gout   . Myocardial infarction     Past Surgical History  Procedure Laterality Date  . Cardiac catheterization  01/04/2010  . Coronary artery bypass graft  02/07/2010    LIMA to LAD, SVG to 2nd DX, SVG to OM  . Nasal polyp excision    . Hemorrhoid surgery      x 2  . Eye surgery      cat  . Hemorrhoid surgery N/A 11/13/2012    Procedure: [place] Hemorrhoidectomy;  Surgeon: Merrie Roof, MD;  Location: [facility];  Service: General;  Laterality: N/A;     Current Outpatient Prescriptions  Medication  Sig Dispense Refill  . amLODipine (NORVASC) 10 MG tablet Take 1 tablet (10 mg total) by mouth daily. 90 tablet 3  . aspirin 81 MG tablet Take 81 mg by mouth. Every Other Day    . atorvastatin (LIPITOR) 10 MG tablet Take 1 tablet (10 mg total) by mouth daily. 90 tablet 3  . Casanthranol-Docusate Sodium 30-100 MG CAPS Take by mouth as needed. Stool Softner    . Cetirizine HCl (ZYRTEC PO) Take 10 mg by mouth daily.     [name] CHERRY PO Take 1 capsule by mouth daily. ** Black Cherry **     . fluocinonide cream (LIDEX) 2.84 % Apply 1 application topically as needed (dry skin).     . Fluticasone-Salmeterol (ADVAIR DISKUS) 250-50 MCG/DOSE AEPB Inhale 1 puff into the lungs every 12 (twelve) hours as needed (as needed for wheezing). For shortness of breath    . hydrochlorothiazide (MICROZIDE) 12.5 MG capsule TAKE 1 CAPSULE (12.5 MG TOTAL) BY MOUTH DAILY. 90 capsule 3  . multivitamin (THERAGRAN) per tablet Take 1 tablet by mouth daily.      . nitroGLYCERIN (NITROSTAT) 0.4 MG SL tablet PLACE 1 TABLET (0.4 MG TOTAL) UNDER THE TONGUE EVERY 5 (FIVE) MINUTES AS NEEDED. FOR CHEST PAIN 25 tablet PRN  . Omega-3 Fatty Acids (OMEGA 3  PO) Take 1 capsule by mouth daily.      [name] losartan (COZAAR) 50 MG tablet Take 1 tablet (50 mg total) by mouth daily. 90 tablet 3   No current facility-administered medications for this visit.    Allergies:   Lisinopril    Social History:  The patient  reports that he quit smoking about 55 years ago. His smoking use included Cigarettes. He has never used smokeless tobacco. He reports that he drinks alcohol. He reports that he does not use illicit drugs.   Family History:  The patient's family history includes Alcohol abuse in his father; Cancer in his mother; Colon cancer in his mother.    ROS:  Please see the history of present illness.   Otherwise, review of systems are positive for none.   All other systems are reviewed and negative.    PHYSICAL EXAM: VS:  BP 134/60 mmHg  Pulse  48  Ht 5\' 11"  (1.803 m)  Wt 184 lb 12.8 oz (83.825 kg)  BMI 25.79 kg/m2  SpO2 97% , BMI Body mass index is 25.79 kg/(m^2). GEN: Well nourished, well developed, in no acute distress HEENT: normal Neck: no JVD, carotid bruits, or masses Cardiac: RRR; no murmurs, rubs, or gallops,no edema  Respiratory:  clear to auscultation bilaterally, normal work of breathing GI: soft, nontender, nondistended, + BS MS: no deformity or atrophy Skin: warm and dry, no rash Neuro:  Strength and sensation are intact Psych: euthymic mood, full affect   EKG:  EKG is not ordered today.    Recent Labs: 01/13/2015: ALT 13; BUN 14; Creatinine, Ser 1.06; Hemoglobin 13.6; Platelets 206.0; Potassium 3.8; Sodium 132*    Lipid Panel    Component Value Date/Time   CHOL 146 01/13/2015 0913   TRIG 109.0 01/13/2015 0913   HDL 56.40 01/13/2015 0913   CHOLHDL 3 01/13/2015 0913   VLDL 21.8 01/13/2015 0913   LDLCALC 68 01/13/2015 0913      Wt Readings from Last 3 Encounters:  01/20/15 184 lb 12.8 oz (83.825 kg)  09/22/14 189 lb 12.8 oz (86.093 kg)  05/18/14 187 lb (84.823 kg)         ASSESSMENT AND PLAN:  1. Ischemic heart disease status post CABG. 2. Bifascicular block 3. Hypercholesterolemia 4. remote history of paroxysmal atrial fibrillation 5. benign hypertensive heart disease without heart failure 6. Left chest discomfort felt to be musculoskeletal 7.  Early memory disorder   Current medicines are reviewed at length with the patient today.  The patient does not have concerns regarding medicines.  The following changes have been made:  no change  Labs/ tests ordered today include:   Orders Placed This Encounter  Procedures  . Lipid panel  . Hepatic function panel  . Basic metabolic panel   The patient is to continue current medication except stop lisinopril and start losartan 50 mg daily.  After the patient and his wife left the examining room area, his wife returned and spoke with  my nurse about her husband's memory loss.  We told her that if she would like, we could refer her to a neurologist for formal evaluation.  Alternatively we can discuss further at next visit.  She will let us know what they decide Recheck in 4 months for office visit lipid panel hepatic function panel and basal metabolic panel.  Berna Spare MD 01/20/2015 6:11 PM    [facility] [place], [place], [place]  [address] Phone: [phone]; Fax: [phone]    

## 2015-01-20 NOTE — Patient Instructions (Signed)
Medication Instructions:  STOP LISINOPRIL  START LOSARTAN 50 MG DAILY   Labwork: NONE  Testing/Procedures: NONE  Follow-Up: Your physician wants you to follow-up in: 4 months with fasting labs (lp/bmet/hfp)  You will receive a reminder letter in the mail two months in advance. If you don't receive a letter, please call our office to schedule the follow-up appointment.

## 2015-03-09 ENCOUNTER — Other Ambulatory Visit: Payer: Self-pay | Admitting: Gastroenterology

## 2015-03-31 ENCOUNTER — Encounter: Payer: Self-pay | Admitting: *Deleted

## 2015-03-31 ENCOUNTER — Other Ambulatory Visit: Payer: Self-pay | Admitting: Cardiology

## 2015-03-31 ENCOUNTER — Other Ambulatory Visit: Payer: Self-pay | Admitting: *Deleted

## 2015-05-18 ENCOUNTER — Other Ambulatory Visit (INDEPENDENT_AMBULATORY_CARE_PROVIDER_SITE_OTHER): Payer: BLUE CROSS/BLUE SHIELD

## 2015-05-18 DIAGNOSIS — I10 Essential (primary) hypertension: Secondary | ICD-10-CM | POA: Diagnosis not present

## 2015-05-18 DIAGNOSIS — L209 Atopic dermatitis, unspecified: Secondary | ICD-10-CM | POA: Insufficient documentation

## 2015-05-18 DIAGNOSIS — I119 Hypertensive heart disease without heart failure: Secondary | ICD-10-CM

## 2015-05-18 DIAGNOSIS — E7800 Pure hypercholesterolemia, unspecified: Secondary | ICD-10-CM

## 2015-05-18 LAB — HEPATIC FUNCTION PANEL
ALBUMIN: 4.1 g/dL (ref 3.6–5.1)
ALK PHOS: 47 U/L (ref 40–115)
ALT: 18 U/L (ref 9–46)
AST: 16 U/L (ref 10–35)
BILIRUBIN INDIRECT: 0.6 mg/dL (ref 0.2–1.2)
Bilirubin, Direct: 0.2 mg/dL (ref <=0.2)
TOTAL PROTEIN: 6.3 g/dL (ref 6.1–8.1)
Total Bilirubin: 0.8 mg/dL (ref 0.2–1.2)

## 2015-05-18 LAB — BASIC METABOLIC PANEL
BUN: 12 mg/dL (ref 7–25)
CALCIUM: 9.1 mg/dL (ref 8.6–10.3)
CHLORIDE: 100 mmol/L (ref 98–110)
CO2: 28 mmol/L (ref 20–31)
CREATININE: 1.03 mg/dL (ref 0.70–1.18)
Glucose, Bld: 97 mg/dL (ref 65–99)
Potassium: 3.9 mmol/L (ref 3.5–5.3)
Sodium: 135 mmol/L (ref 135–146)

## 2015-05-18 LAB — LIPID PANEL
Cholesterol: 145 mg/dL (ref 125–200)
HDL: 52 mg/dL (ref >=40)
LDL CALC: 63 mg/dL (ref <130)
TRIGLYCERIDES: 150 mg/dL — AB (ref <150)
Total CHOL/HDL Ratio: 2.8 Ratio (ref <=5.0)
VLDL: 30 mg/dL (ref <30)

## 2015-05-18 NOTE — Addendum Note (Signed)
Addended by: Velna Ochs on: 05/18/2015 09:35 AM   Modules accepted: Orders

## 2015-05-19 NOTE — Progress Notes (Signed)
Quick Note:  Please make copy of labs for patient visit. ______ 

## 2015-05-31 ENCOUNTER — Encounter: Payer: Self-pay | Admitting: Cardiology

## 2015-05-31 ENCOUNTER — Ambulatory Visit (INDEPENDENT_AMBULATORY_CARE_PROVIDER_SITE_OTHER): Payer: BLUE CROSS/BLUE SHIELD | Admitting: Cardiology

## 2015-05-31 VITALS — BP 134/72 | HR 59 | Ht 71.0 in | Wt 185.4 lb

## 2015-05-31 DIAGNOSIS — Z951 Presence of aortocoronary bypass graft: Secondary | ICD-10-CM | POA: Diagnosis not present

## 2015-05-31 DIAGNOSIS — I452 Bifascicular block: Secondary | ICD-10-CM

## 2015-05-31 DIAGNOSIS — R413 Other amnesia: Secondary | ICD-10-CM | POA: Insufficient documentation

## 2015-05-31 DIAGNOSIS — E7800 Pure hypercholesterolemia, unspecified: Secondary | ICD-10-CM | POA: Diagnosis not present

## 2015-05-31 DIAGNOSIS — I119 Hypertensive heart disease without heart failure: Secondary | ICD-10-CM

## 2015-05-31 MED ORDER — AMLODIPINE BESYLATE 10 MG PO TABS: 10.0000 mg | ORAL_TABLET | Freq: Every day | ORAL | Status: DC

## 2015-05-31 MED ORDER — ATORVASTATIN CALCIUM 10 MG PO TABS: 10.0000 mg | ORAL_TABLET | Freq: Every day | ORAL | Status: DC

## 2015-05-31 MED ORDER — HYDROCHLOROTHIAZIDE 12.5 MG PO CAPS: ORAL_CAPSULE | ORAL | Status: DC

## 2015-05-31 MED ORDER — LOSARTAN POTASSIUM 50 MG PO TABS: 50.0000 mg | ORAL_TABLET | Freq: Every day | ORAL | Status: DC

## 2015-05-31 NOTE — Progress Notes (Signed)
Cardiology Office Note   Date:  05/31/2015   ID:  Zachary Hall, DOB 15-Jun-1935, MRN [id number]  PCP:  Wilhemena Durie, MD  Cardiologist: Darlin Coco MD  No chief complaint on file.     History of Present Illness: Zachary Hall is a 80 y.o. male who presents for a scheduled follow-up visit This pleasant 80 year old gentleman is seen for a scheduled 4 month followup office visit. He has a history of known ischemic heart disease. He underwent coronary artery bypass graft surgery on 02/07/10 by Dr. Cyndia Bent. He has been doing well. He is having no recurrent angina. He has not been expressing any racing of his heart. His occasional dizzy spells have improved as he cut back on his lisinopril. He has occasional chest tightness which appears to be musculoskeletal rather than cardiac. He has a history of a cough from lisinopril.  However, his cough has resolved on losartan. He indicates that he is having some problems with his memory.  His wife is also concerned about memory loss and some personality changes.  She would like [place] to refer him to neurology and we will arrange for that.   Past Medical History  Diagnosis Date  . Hypertension   . Hypercholesterolemia   . Coronary artery disease   . Nocturia   . History of atrial fibrillation     Previously on amiodarone  . Bradycardia   . Hx of CABG   . Gout   . Myocardial infarction [facility])     Past Surgical History  Procedure Laterality Date  . Cardiac catheterization  01/04/2010  . Coronary artery bypass graft  02/07/2010    LIMA to LAD, SVG to 2nd DX, SVG to OM  . Nasal polyp excision    . Hemorrhoid surgery      x 2  . Eye surgery      cat  . Hemorrhoid surgery N/A 11/13/2012    Procedure: [place] Hemorrhoidectomy;  Surgeon: Merrie Roof, MD;  Location: [facility];  Service: General;  Laterality: N/A;     Current Outpatient Prescriptions  Medication Sig Dispense Refill  . amLODipine (NORVASC) 10 MG tablet Take 1  tablet (10 mg total) by mouth daily. 90 tablet 3  . aspirin 81 MG tablet Take 81 mg by mouth. Every Other Day    . atorvastatin (LIPITOR) 10 MG tablet Take 1 tablet (10 mg total) by mouth daily. 90 tablet 3  . Casanthranol-Docusate Sodium 30-100 MG CAPS Take 1 capsule by mouth 2 (two) times daily. Stool Softner    . Cetirizine HCl (ZYRTEC PO) Take 10 mg by mouth daily.     [name] CHERRY PO Take 1 capsule by mouth daily. ** Black Cherry **     . clobetasol ointment (TEMOVATE) 5.28 % Apply 1 application topically daily as needed. Skin irritation    . fluocinonide cream (LIDEX) 4.13 % Apply 1 application topically as needed (dry skin).     . fluorouracil (EFUDEX) 5 % cream Apply 1 application topically daily. To top of scalp    . Fluticasone-Salmeterol (ADVAIR DISKUS) 250-50 MCG/DOSE AEPB Inhale 1 puff into the lungs every 12 (twelve) hours as needed (as needed for wheezing). For shortness of breath    . hydrochlorothiazide (MICROZIDE) 12.5 MG capsule TAKE 1 CAPSULE (12.5 MG TOTAL) BY MOUTH DAILY. 90 capsule 3  . losartan (COZAAR) 50 MG tablet Take 1 tablet (50 mg total) by mouth daily. 90 tablet 3  . multivitamin (THERAGRAN) per  tablet Take 1 tablet by mouth daily.      . nitroGLYCERIN (NITROSTAT) 0.4 MG SL tablet PLACE 1 TABLET (0.4 MG TOTAL) UNDER THE TONGUE EVERY 5 (FIVE) MINUTES AS NEEDED. FOR CHEST PAIN 25 tablet PRN  . Omega-3 Fatty Acids (OMEGA 3 PO) Take 1 capsule by mouth daily.       No current facility-administered medications for this visit.    Allergies:   Lisinopril    Social History:  The patient  reports that he quit smoking about 55 years ago. His smoking use included Cigarettes. He has never used smokeless tobacco. He reports that he drinks alcohol. He reports that he does not use illicit drugs.   Family History:  The patient's family history includes Alcohol abuse in his father; Cancer in his mother; Colon cancer in his mother.    ROS:  Please see the history of present  illness.   Otherwise, review of systems are positive for none.   All other systems are reviewed and negative.    PHYSICAL EXAM: VS:  BP 134/72 mmHg  Pulse 59  Ht 5\' 11"  (1.803 m)  Wt 185 lb 6.4 oz (84.097 kg)  BMI 25.87 kg/m2 , BMI Body mass index is 25.87 kg/(m^2). GEN: Well nourished, well developed, in no acute distress HEENT: normal Neck: no JVD, carotid bruits, or masses Cardiac: RRR; no murmurs, rubs, or gallops,no edema  Respiratory:  clear to auscultation bilaterally, normal work of breathing GI: soft, nontender, nondistended, + BS MS: no deformity or atrophy Skin: warm and dry, no rash Neuro:  Strength and sensation are intact Psych: euthymic mood, full affect   EKG:  EKG is not ordered today.    Recent Labs: 01/13/2015: Hemoglobin 13.6; Platelets 206.0 05/18/2015: ALT 18; BUN 12; Creat 1.03; Potassium 3.9; Sodium 135    Lipid Panel    Component Value Date/Time   CHOL 145 05/18/2015 0935   TRIG 150* 05/18/2015 0935   HDL 52 05/18/2015 0935   CHOLHDL 2.8 05/18/2015 0935   VLDL 30 05/18/2015 0935   LDLCALC 63 05/18/2015 0935      Wt Readings from Last 3 Encounters:  05/31/15 185 lb 6.4 oz (84.097 kg)  01/20/15 184 lb 12.8 oz (83.825 kg)  09/22/14 189 lb 12.8 oz (86.093 kg)        ASSESSMENT AND PLAN:  1. Ischemic heart disease status post CABG. 2. Bifascicular block 3. Hypercholesterolemia 4. remote history of paroxysmal atrial fibrillation 5. benign hypertensive heart disease without heart failure 6. Left chest discomfort felt to be musculoskeletal 7. Early memory disorder 8.  History of colon polyps.  Most recent colonoscopy 03/19/2015.   Current medicines are reviewed at length with the patient today.  The patient does not have concerns regarding medicines.  The following changes have been made:  no change  Labs/ tests ordered today include:   Orders Placed This Encounter  Procedures  . Ambulatory referral to Neurology     Disposition: Continue current medication.  Recheck in 4 months for office visit lipid panel hepatic function panel and basal metabolic panel.  Refer to neurology regarding memory disorder.  Berna Spare MD 05/31/2015 3:40 PM    [facility] [place], [place], [place]  [address] Phone: [phone]; Fax: [phone]

## 2015-05-31 NOTE — Patient Instructions (Addendum)
Medication Instructions:  Your physician recommends that you continue on your current medications as directed. Please refer to the Current Medication list given to you today.  Labwork: none  Testing/Procedures: none  Follow-Up: Your physician recommends that you schedule a follow-up appointment in: 4 months with fasting labs (lp/bmet/hfp) ov with Tera Helper NP or Brynda Rim PA   Any Other Special Instructions Will Be Listed Below (If Applicable). Referral place for neurology consult   If you need a refill on your cardiac medications before your next appointment, please call your pharmacy.

## 2015-07-20 ENCOUNTER — Ambulatory Visit: Payer: BLUE CROSS/BLUE SHIELD | Admitting: Neurology

## 2015-08-19 ENCOUNTER — Other Ambulatory Visit: Payer: Self-pay

## 2015-09-14 ENCOUNTER — Other Ambulatory Visit (INDEPENDENT_AMBULATORY_CARE_PROVIDER_SITE_OTHER): Payer: BLUE CROSS/BLUE SHIELD

## 2015-09-14 ENCOUNTER — Encounter: Payer: Self-pay | Admitting: Neurology

## 2015-09-14 ENCOUNTER — Ambulatory Visit (INDEPENDENT_AMBULATORY_CARE_PROVIDER_SITE_OTHER): Payer: BLUE CROSS/BLUE SHIELD | Admitting: Neurology

## 2015-09-14 VITALS — BP 136/68 | HR 53 | Resp 16 | Wt 188.0 lb

## 2015-09-14 DIAGNOSIS — E785 Hyperlipidemia, unspecified: Secondary | ICD-10-CM

## 2015-09-14 DIAGNOSIS — R413 Other amnesia: Secondary | ICD-10-CM

## 2015-09-14 DIAGNOSIS — I10 Essential (primary) hypertension: Secondary | ICD-10-CM | POA: Diagnosis not present

## 2015-09-14 LAB — TSH: TSH: 2.39 u[IU]/mL (ref 0.35–4.50)

## 2015-09-14 LAB — VITAMIN B12: VITAMIN B 12: 455 pg/mL (ref 211–911)

## 2015-09-14 NOTE — Progress Notes (Signed)
NEUROLOGY CONSULTATION NOTE  ANTRAVIOUS DHINGRA MRN: [id number] DOB: [birth year]  Referring provider: Dr. Darlin Coco Primary care provider: Dr. Miguel Aschoff  Reason for consult:  Memory loss  Dear Dr [name]:  Thank you for your kind referral of Zachary Hall for consultation of the above symptoms. Although his history is well known to you, please allow me to reiterate it for the purpose of our medical record. The patient was accompanied to the clinic by his wife who also provides collateral information. Records and images were personally reviewed where available.  HISTORY OF PRESENT ILLNESS: This is a 81 year old right-handed man with a history of CAD s/p CABG, bifascicular block, hyperlipidemia, remote history of paroxysmal atrial fibrillation, hypertension, presenting for evaluation of worsening memory. He feels his memory is "pretty good," but does notice more short-term memory difficulties. He is a retired Customer service manager. His wife started noticing memory changes after his heart surgery in 2011, but feels that changes are more of a personality change. He is more irritable, sometimes ignoring his wife even if she is speaking right in front of him ("selective hearing"). He occasionally repeats himself. He and his wife deny getting lost driving, but she feels like his mind wanders when he is driving, sometimes not paying attention to what is in front of him when he gets distracted. His wife reports changes are "lots of little changes," he would have difficulty changing from Plan A to Plan B, in the past he used to be quick to decide. His wife fixes his pillbox for him and sometimes notices he skips a day. She is in charge of bill payments. She denies any paranoia, but feels that he has low self-esteem, saying "oh I'm just a bad person," or "I can't do anything right." When he was in cardiac rehab, he reports that therapists kept asking him if he was depressed. He denies any family history of  dementia, no significant head injuries. He has reduced alcohol intake but wife reports he drinks a small glass of Shearon Stalls every night.   He denies any headaches, dizziness, diplopia, dysarthria, dysphagia, neck/back pain, focal numbness/tingling/weakness, bowel/bladder dysfunction, or tremors. He has an occasional "bolt of lightning" going down his right arm when he hits his elbow. 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  Laboratory Data: Lab Results  Component Value Date   WBC 7.5 01/13/2015   HGB 13.6 01/13/2015   HCT 38.9* 01/13/2015   MCV 97.5 01/13/2015   PLT 206.0 01/13/2015     Chemistry      Component Value Date/Time   NA 135 05/18/2015 0935   K 3.9 05/18/2015 0935   CL 100 05/18/2015 0935   CO2 28 05/18/2015 0935   BUN 12 05/18/2015 0935   CREATININE 1.03 05/18/2015 0935   CREATININE 1.06 01/13/2015 0913      Component Value Date/Time   CALCIUM 9.1 05/18/2015 0935   ALKPHOS 47 05/18/2015 0935   AST 16 05/18/2015 0935   ALT 18 05/18/2015 0935   BILITOT 0.8 05/18/2015 0935        PAST MEDICAL HISTORY: Past Medical History  Diagnosis Date  . Hypertension   . Hypercholesterolemia   . Coronary artery disease   . Nocturia   . History of atrial fibrillation     Previously on amiodarone  . Bradycardia   . Hx of CABG   . Gout   . Myocardial infarction [facility])     PAST SURGICAL HISTORY: Past Surgical History  Procedure Laterality Date  . Cardiac catheterization  01/04/2010  . Coronary artery bypass graft  02/07/2010    LIMA to LAD, SVG to 2nd DX, SVG to OM  . Nasal polyp excision    . Hemorrhoid surgery      x 2  . Eye surgery      cat  . Hemorrhoid surgery N/A 11/13/2012    Procedure: [place] Hemorrhoidectomy;  Surgeon: Merrie Roof, MD;  Location: [place];  Service: General;  Laterality: N/A;    MEDICATIONS: Current Outpatient Prescriptions on File Prior to Visit  Medication Sig Dispense Refill  . amLODipine (NORVASC) 10 MG tablet Take 1 tablet (10  mg total) by mouth daily. 90 tablet 3  . aspirin 81 MG tablet Take 81 mg by mouth. Every Other Day    . atorvastatin (LIPITOR) 10 MG tablet Take 1 tablet (10 mg total) by mouth daily. 90 tablet 3  . Casanthranol-Docusate Sodium 30-100 MG CAPS Take 1 capsule by mouth 2 (two) times daily. Stool Softner    . Cetirizine HCl (ZYRTEC PO) Take 10 mg by mouth daily.     [name] CHERRY PO Take 1 capsule by mouth daily. ** Black Cherry **     . clobetasol ointment (TEMOVATE) AB-123456789 % Apply 1 application topically daily as needed. Skin irritation    . fluocinonide cream (LIDEX) AB-123456789 % Apply 1 application topically as needed (dry skin).     . fluorouracil (EFUDEX) 5 % cream Apply 1 application topically daily. To top of scalp    . hydrochlorothiazide (MICROZIDE) 12.5 MG capsule TAKE 1 CAPSULE (12.5 MG TOTAL) BY MOUTH DAILY. 90 capsule 3  . losartan (COZAAR) 50 MG tablet Take 1 tablet (50 mg total) by mouth daily. 90 tablet 3  . multivitamin (THERAGRAN) per tablet Take 1 tablet by mouth daily.      . Omega-3 Fatty Acids (OMEGA 3 PO) Take 1 capsule by mouth daily.      . Fluticasone-Salmeterol (ADVAIR DISKUS) 250-50 MCG/DOSE AEPB Inhale 1 puff into the lungs every 12 (twelve) hours as needed (as needed for wheezing). Reported on 09/14/2015    . nitroGLYCERIN (NITROSTAT) 0.4 MG SL tablet PLACE 1 TABLET (0.4 MG TOTAL) UNDER THE TONGUE EVERY 5 (FIVE) MINUTES AS NEEDED. FOR CHEST PAIN (Patient not taking: Reported on 09/14/2015) 25 tablet PRN   No current facility-administered medications on file prior to visit.    ALLERGIES: Allergies  Allergen Reactions  . Lisinopril     Cough    FAMILY HISTORY: Family History  Problem Relation Age of Onset  . Alcohol abuse Father   . Colon cancer Mother   . Cancer Mother     colon and bone    SOCIAL HISTORY: Social History   Social History  . Marital Status: Married    Spouse Name: N/A  . Number of Children: N/A  . Years of Education: N/A   Occupational History    . Not on file.   Social History Main Topics  . Smoking status: Former Smoker    Types: Cigarettes    Quit date: 01/08/1960  . Smokeless tobacco: Never Used     Comment: occ alcohol  . Alcohol Use: Yes  . Drug Use: No  . Sexual Activity: Not on file   Other Topics Concern  . Not on file   Social History Narrative    REVIEW OF SYSTEMS: Constitutional: No fevers, chills, or sweats, no generalized fatigue, change in appetite Eyes: No visual changes, double  vision, eye pain Ear, nose and throat: No hearing loss, ear pain, nasal congestion, sore throat Cardiovascular: No chest pain, palpitations Respiratory:  No shortness of breath at rest or with exertion, wheezes GastrointestinaI: No nausea, vomiting, diarrhea, abdominal pain, fecal incontinence Genitourinary:  No dysuria, urinary retention or frequency Musculoskeletal:  No neck pain, back pain Integumentary: No rash, pruritus, skin lesions Neurological: as above Psychiatric: No depression, insomnia, anxiety Endocrine: No palpitations, fatigue, diaphoresis, mood swings, change in appetite, change in weight, increased thirst Hematologic/Lymphatic:  No anemia, purpura, petechiae. Allergic/Immunologic: no itchy/runny eyes, nasal congestion, recent allergic reactions, rashes  PHYSICAL EXAM: Filed Vitals:   09/14/15 1403  BP: 136/68  Pulse: 53   General: No acute distress Head:  Normocephalic/atraumatic Eyes: Fundoscopic exam shows bilateral sharp discs, no vessel changes, exudates, or hemorrhages Neck: supple, no paraspinal tenderness, full range of motion Back: No paraspinal tenderness Heart: regular rate and rhythm Lungs: Clear to auscultation bilaterally. Vascular: No carotid bruits. Skin/Extremities: No rash, no edema Neurological Exam: Mental status: alert and oriented to person, place, and time, no dysarthria or aphasia, Fund of knowledge is appropriate.  Recent and remote memory are intact.  Attention and  concentration are normal.    Able to name objects and repeat phrases. CDT 5/5. MMSE - Mini Mental State Exam 09/14/2015  Orientation to time 5  Orientation to Place 5  Registration 3  Attention/ Calculation 4  Recall 2  Language- name 2 objects 2  Language- repeat 1  Language- follow 3 step command 3  Language- read & follow direction 1  Write a sentence 1  Copy design 1  Total score 28   Cranial nerves: CN I: not tested CN II: pupils equal, round and reactive to light, visual fields intact, fundi unremarkable. CN III, IV, VI:  full range of motion, no nystagmus, no ptosis CN V: facial sensation intact CN VII: upper and lower face symmetric CN VIII: hearing intact to finger rub CN IX, X: gag intact, uvula midline CN XI: sternocleidomastoid and trapezius muscles intact CN XII: tongue midline Bulk & Tone: normal, no fasciculations. Motor: 5/5 throughout with no pronator drift. Sensation: intact to light touch, cold, pin, vibration and joint position sense.  No extinction to double simultaneous stimulation.  Romberg test negative Deep Tendon Reflexes: +1 on both UE, +2 both LE, no ankle clonus Plantar responses: downgoing bilaterally Cerebellar: no incoordination on finger to nose, heel to shin. No dysdiadochokinesia Gait: narrow-based and steady, able to tandem walk adequately. Tremor: none  IMPRESSION: This is a 81 year old right-handed man with vascular risk factors including hypertension, hyperlipidemia, CAD, paroxysmal atrial fibrillation, presenting for evaluation of worsening memory loss. His MMSE today is normal 28/30, neurological exam non-focal. We discussed different causes of memory loss. Check TSH and B12. MRI brain without contrast will be ordered to assess for underlying structural abnormality and assess vascular load. We also discussed effects of mood on memory. No clear indication to start cholinesterase inhibitors at this time. We discussed the importance of control  of vascular risk factors, physical exercise, and brain stimulation exercises for brain health. He will follow-up in 1 year and knows to call for any questions.   Thank you for allowing me to participate in the care of this patient. Please do not hesitate to call for any questions or concerns.   Ellouise Newer, M.D.  CC: Dr. Rosanna Randy

## 2015-09-14 NOTE — Patient Instructions (Signed)
1. Schedule MRI brain without contrast 2. Bloodwork for TSH, B12 3. Control of BP, cholesterol, as well as physical exercise and brain stimulation exercises are important for brain health 4. Follow-up in 1 year, call for any changes

## 2015-09-15 ENCOUNTER — Telehealth: Payer: Self-pay | Admitting: Family Medicine

## 2015-09-15 NOTE — Telephone Encounter (Signed)
Notified patient's wife of results.

## 2015-09-15 NOTE — Telephone Encounter (Signed)
-----   Message from Cameron Sprang, MD sent at 09/15/2015  9:58 AM EST ----- Pls let him know thyroid and B12 levels are normal. Thanks

## 2015-09-16 ENCOUNTER — Encounter: Payer: Self-pay | Admitting: Family Medicine

## 2015-09-16 DIAGNOSIS — E785 Hyperlipidemia, unspecified: Secondary | ICD-10-CM | POA: Insufficient documentation

## 2015-09-16 DIAGNOSIS — I10 Essential (primary) hypertension: Secondary | ICD-10-CM | POA: Insufficient documentation

## 2015-09-16 DIAGNOSIS — R413 Other amnesia: Secondary | ICD-10-CM | POA: Insufficient documentation

## 2015-09-17 ENCOUNTER — Telehealth: Payer: Self-pay | Admitting: Family Medicine

## 2015-09-17 NOTE — Telephone Encounter (Signed)
Called to give patient MRI appt information. Spoke with patient's wife he has been scheduled at [facility] for MRI on 10/01/15 @12 :00 with an 11:45 arrival time. Patient is to go to main entrance of hospital Radiology is on the 1st floor.

## 2015-09-21 ENCOUNTER — Other Ambulatory Visit (INDEPENDENT_AMBULATORY_CARE_PROVIDER_SITE_OTHER): Payer: BLUE CROSS/BLUE SHIELD | Admitting: *Deleted

## 2015-09-21 DIAGNOSIS — I10 Essential (primary) hypertension: Secondary | ICD-10-CM

## 2015-09-21 LAB — BASIC METABOLIC PANEL
BUN: 13 mg/dL (ref 7–25)
CALCIUM: 8.9 mg/dL (ref 8.6–10.3)
CO2: 25 mmol/L (ref 20–31)
CREATININE: 1.15 mg/dL (ref 0.70–1.18)
Chloride: 102 mmol/L (ref 98–110)
Glucose, Bld: 96 mg/dL (ref 65–99)
Potassium: 4.3 mmol/L (ref 3.5–5.3)
Sodium: 138 mmol/L (ref 135–146)

## 2015-09-21 LAB — LIPID PANEL
CHOLESTEROL: 122 mg/dL — AB (ref 125–200)
HDL: 46 mg/dL (ref >=40)
LDL Cholesterol: 51 mg/dL (ref <130)
TRIGLYCERIDES: 127 mg/dL (ref <150)
Total CHOL/HDL Ratio: 2.7 Ratio (ref <=5.0)
VLDL: 25 mg/dL (ref <30)

## 2015-09-21 LAB — HEPATIC FUNCTION PANEL
ALBUMIN: 4.1 g/dL (ref 3.6–5.1)
ALK PHOS: 47 U/L (ref 40–115)
ALT: 19 U/L (ref 9–46)
AST: 16 U/L (ref 10–35)
BILIRUBIN INDIRECT: 0.5 mg/dL (ref 0.2–1.2)
Bilirubin, Direct: 0.1 mg/dL (ref <=0.2)
TOTAL PROTEIN: 6.7 g/dL (ref 6.1–8.1)
Total Bilirubin: 0.6 mg/dL (ref 0.2–1.2)

## 2015-09-27 NOTE — Progress Notes (Signed)
Cardiology Office Note   Date:  09/28/2015   ID:  DAREUS SUNDET, DOB 05-03-1935, MRN [id number]  PCP:  Wilhemena Durie, MD  Cardiologist:  Dr. [name] --> Dr. [name]   4 month follow up    History of Present Illness: Zachary Hall is a 81 y.o. male with a history of CAD s/p CABG (2011), memory loss, bifascicular block, HLD, and remote PAF who presents to clinic for follow up.   He has a history of known ischemic heart disease. He underwent coronary artery bypass graft surgery on 02/07/10 by Dr. Cyndia Bent. He has a history of a cough from lisinopril that resolved on losartan. He also has a history of MSK chest pain.  He was last seen by Dr. [name] in 05/2015. He was having some problems with his memory and possible personality changes.He was referred to neurology. He did see Dr. Delice Lesch with neurology on 09/14/15 who checked some blood work and ordered a MRI, which is scheduled for this Friday.   Today he presents to clinic for follow up. He has been having some mild issues with word finding and some other personality changes. No chest pain or SOB.  He doesn't formally exercise but does a lot of yard work and Therapist, music work on cars. He keeps busy and has no issues. No LE edema, orthopnea or PND. No dizziness or syncope.    Past Medical History  Diagnosis Date  . Hypertension   . Hypercholesterolemia   . Coronary artery disease   . Nocturia   . History of atrial fibrillation     Previously on amiodarone  . Bradycardia   . Hx of CABG   . Gout   . Myocardial infarction [facility])     Past Surgical History  Procedure Laterality Date  . Cardiac catheterization  01/04/2010  . Coronary artery bypass graft  02/07/2010    LIMA to LAD, SVG to 2nd DX, SVG to OM  . Nasal polyp excision    . Hemorrhoid surgery      x 2  . Eye surgery Bilateral     Cataract Extraction  . Hemorrhoid surgery N/A 11/13/2012    Procedure: [place] Hemorrhoidectomy;  Surgeon: Merrie Roof, MD;   Location: [place];  Service: General;  Laterality: N/A;  . Other surgical history  06/05/2002    Penile Implant     Current Outpatient Prescriptions  Medication Sig Dispense Refill  . amLODipine (NORVASC) 10 MG tablet Take 1 tablet (10 mg total) by mouth daily. 90 tablet 3  . aspirin 81 MG tablet Take 81 mg by mouth. Every Other Day    . atorvastatin (LIPITOR) 10 MG tablet Take 1 tablet (10 mg total) by mouth daily. 90 tablet 3  . Casanthranol-Docusate Sodium 30-100 MG CAPS Take 1 capsule by mouth 2 (two) times daily. Stool Softner    . Cetirizine HCl (ZYRTEC PO) Take 10 mg by mouth daily.     [name] CHERRY PO Take 1 capsule by mouth daily. ** Black Cherry **     . clobetasol ointment (TEMOVATE) AB-123456789 % Apply 1 application topically daily as needed. Skin irritation    . fluocinonide cream (LIDEX) AB-123456789 % Apply 1 application topically as needed (dry skin).     . fluorouracil (EFUDEX) 5 % cream Apply 1 application topically daily. To top of scalp    . Fluticasone-Salmeterol (ADVAIR DISKUS) 250-50 MCG/DOSE AEPB Inhale 1 puff into the lungs every 12 (twelve) hours as needed (as  needed for wheezing). Reported on 09/14/2015    . hydrochlorothiazide (MICROZIDE) 12.5 MG capsule TAKE 1 CAPSULE (12.5 MG TOTAL) BY MOUTH DAILY. 90 capsule 3  . losartan (COZAAR) 50 MG tablet Take 1 tablet (50 mg total) by mouth daily. 90 tablet 3  . multivitamin (THERAGRAN) per tablet Take 1 tablet by mouth daily.      . nitroGLYCERIN (NITROSTAT) 0.4 MG SL tablet PLACE 1 TABLET (0.4 MG TOTAL) UNDER THE TONGUE EVERY 5 (FIVE) MINUTES AS NEEDED. FOR CHEST PAIN 25 tablet PRN  . Omega-3 Fatty Acids (OMEGA 3 PO) Take 1 capsule by mouth daily.       No current facility-administered medications for this visit.    Allergies:   Lisinopril    Social History:  The patient  reports that he quit smoking about 53 years ago. His smoking use included Cigarettes. He has quit using smokeless tobacco. He reports that he drinks alcohol. He reports  that he does not use illicit drugs.   Family History:  The patient's family history includes Alcohol abuse in his father; Cancer in his mother; Colon cancer in his mother. There is no history of Heart attack, Hypertension, or Stroke.    ROS:  Please see the history of present illness.   Otherwise, review of systems are positive for none.   All other systems are reviewed and negative.    PHYSICAL EXAM: VS:  BP 120/70 mmHg  Pulse 56  Ht 5\' 11"  (1.803 m)  Wt 186 lb 12.8 oz (84.732 kg)  BMI 26.06 kg/m2 , BMI Body mass index is 26.06 kg/(m^2). GEN: Well nourished, well developed, in no acute distress HEENT: normal Neck: no JVD, carotid bruits, or masses Cardiac: RRR; no murmurs, rubs, or gallops,no edema  Respiratory:  clear to auscultation bilaterally, normal work of breathing GI: soft, nontender, nondistended, + BS MS: no deformity or atrophy Skin: warm and dry, no rash Neuro:  Strength and sensation are intact Psych: euthymic mood, full affect   EKG:  EKG is ordered today. The ekg ordered today demonstrates sinus bradycardia and HR 48.    Recent Labs: 01/13/2015: Hemoglobin 13.6; Platelets 206.0 09/14/2015: TSH 2.39 09/21/2015: ALT 19; BUN 13; Creat 1.15; Potassium 4.3; Sodium 138    Lipid Panel    Component Value Date/Time   CHOL 122* 09/21/2015 0928   TRIG 127 09/21/2015 0928   HDL 46 09/21/2015 0928   CHOLHDL 2.7 09/21/2015 0928   VLDL 25 09/21/2015 0928   LDLCALC 51 09/21/2015 0928      Wt Readings from Last 3 Encounters:  09/28/15 186 lb 12.8 oz (84.732 kg)  09/14/15 188 lb (85.276 kg)  05/31/15 185 lb 6.4 oz (84.097 kg)      Other studies Reviewed: Additional studies/ records that were reviewed today include: nuclear stress test. Review of the above records demonstrates:   Nuclear stress test 08/2013: No ischemia. Ejection fraction was normal at 66%.   ASSESSMENT AND PLAN:  HELIOS GELTZ is a 81 y.o. male with a history of CAD s/p CABG (2011),  memory loss, bifascicular block, HLD, HTN and remote PAF who presents to clinic for follow up.   CAD s/p CABG: stable. Continue ASA and statin. Not on BB due to baseline bradycardia.   PAF: felt to be isolated to post operatively. No recurrence of this.    HTN: BP well controlled on current regimen  HLD: recent lipid panel (09/21/15) with excellent LDL at 51. LFTs normal. Continue atorvastatin 10mg  daily.  Dementia?: history of worsening memory. Continue to follow with neurology  Current medicines are reviewed at length with the patient today.  The patient does not have concerns regarding medicines.  The following changes have been made: none   Labs/ tests ordered today include:  No orders of the defined types were placed in this encounter.    Disposition:   FU with Dr. [name] in 6 months.   Renea Ee  09/28/2015 11:20 AM    [facility] [address], [address] [place], [place]  [address] Phone: [phone]; Fax: [phone]

## 2015-09-28 ENCOUNTER — Encounter: Payer: Self-pay | Admitting: Physician Assistant

## 2015-09-28 ENCOUNTER — Ambulatory Visit: Payer: BLUE CROSS/BLUE SHIELD | Admitting: Physician Assistant

## 2015-09-28 ENCOUNTER — Ambulatory Visit (INDEPENDENT_AMBULATORY_CARE_PROVIDER_SITE_OTHER): Payer: BLUE CROSS/BLUE SHIELD | Admitting: Physician Assistant

## 2015-09-28 VITALS — BP 120/70 | HR 56 | Ht 71.0 in | Wt 186.8 lb

## 2015-09-28 DIAGNOSIS — I2510 Atherosclerotic heart disease of native coronary artery without angina pectoris: Secondary | ICD-10-CM | POA: Diagnosis not present

## 2015-09-28 NOTE — Patient Instructions (Signed)
Medication Instructions:  Your physician recommends that you continue on your current medications as directed. Please refer to the Current Medication list given to you today.   Labwork: None ordered  Testing/Procedures: None ordered  Follow-Up: Your physician wants you to follow-up in: 6 months with Dr.Jordan You will receive a reminder letter in the mail two months in advance. If you don't receive a letter, please call our office to schedule the follow-up appointment.   Any Other Special Instructions Will Be Listed Below (If Applicable).     If you need a refill on your cardiac medications before your next appointment, please call your pharmacy.   

## 2015-10-01 ENCOUNTER — Ambulatory Visit (HOSPITAL_COMMUNITY)
Admission: RE | Admit: 2015-10-01 | Discharge: 2015-10-01 | Disposition: A | Discharge status: Home / Self Care | Payer: BLUE CROSS/BLUE SHIELD | Source: Ambulatory Visit | Attending: Neurology | Admitting: Neurology

## 2015-10-01 DIAGNOSIS — I6782 Cerebral ischemia: Secondary | ICD-10-CM | POA: Insufficient documentation

## 2015-10-01 DIAGNOSIS — R413 Other amnesia: Secondary | ICD-10-CM | POA: Insufficient documentation

## 2015-10-05 ENCOUNTER — Telehealth: Payer: Self-pay | Admitting: Family Medicine

## 2015-10-05 NOTE — Telephone Encounter (Signed)
Lmovm to rtn my call. 

## 2015-10-05 NOTE — Telephone Encounter (Signed)
-----   Message from Cameron Sprang, MD sent at 10/05/2015  2:54 PM EST ----- Pls let patient/wife know I reviewed MRI brain and it is unremarkable, no evidence of tumor, stroke, or bleed. It shows age-related changes. Thanks

## 2015-10-06 NOTE — Telephone Encounter (Signed)
PT returned your call/[phone]/Dawn

## 2015-10-06 NOTE — Telephone Encounter (Signed)
Spoke with paitent's wife and notified her of result.

## 2016-03-08 ENCOUNTER — Telehealth: Payer: Self-pay | Admitting: Cardiology

## 2016-03-08 DIAGNOSIS — E7800 Pure hypercholesterolemia, unspecified: Secondary | ICD-10-CM

## 2016-03-08 DIAGNOSIS — I10 Essential (primary) hypertension: Secondary | ICD-10-CM

## 2016-03-08 NOTE — Telephone Encounter (Signed)
Returned call to patient's DPR. She expressed it is time for her husband to be seen and wants an appt with Dr [name] not a PA. Patient is a former Dr [name] patient.  Advised patient I would send this request to Malachy Mood, Dr Doug Sou nurse.

## 2016-03-08 NOTE — Telephone Encounter (Signed)
New message    Pt wife states that her husband was suppose to be seen August 2017. She states that someone here dropped the ball b/c pt was never scheduled. Offered pt 07/07/16 appt and it was refused and refused to see a PA. Wife would like pt to be worked in this month (August). Please call.

## 2016-03-09 NOTE — Telephone Encounter (Signed)
Returned call to patient's wife.She stated husband saw Angelena Form PA 09/28/15 and was told to see Dr.Jordan in 6 months.Stated she called yesterday and was told no appointments until December.Stated she would like him seen before then.Stated he is doing well.Appointment scheduled with Dr.Jordan 04/24/16 at 9:45 am.Pt will have fasting lab at [facility] office 04/17/16.Advised to call sooner if needed.

## 2016-04-07 ENCOUNTER — Encounter: Payer: Self-pay | Admitting: Cardiology

## 2016-04-17 ENCOUNTER — Other Ambulatory Visit: Payer: BLUE CROSS/BLUE SHIELD | Admitting: *Deleted

## 2016-04-17 DIAGNOSIS — E7800 Pure hypercholesterolemia, unspecified: Secondary | ICD-10-CM

## 2016-04-17 DIAGNOSIS — I10 Essential (primary) hypertension: Secondary | ICD-10-CM

## 2016-04-17 LAB — BASIC METABOLIC PANEL
BUN: 14 mg/dL (ref 7–25)
CALCIUM: 9 mg/dL (ref 8.6–10.3)
CHLORIDE: 99 mmol/L (ref 98–110)
CO2: 28 mmol/L (ref 20–31)
CREATININE: 1.01 mg/dL (ref 0.70–1.18)
Glucose, Bld: 100 mg/dL — ABNORMAL HIGH (ref 65–99)
Potassium: 3.8 mmol/L (ref 3.5–5.3)
Sodium: 135 mmol/L (ref 135–146)

## 2016-04-17 LAB — HEPATIC FUNCTION PANEL
ALBUMIN: 4.2 g/dL (ref 3.6–5.1)
ALT: 12 U/L (ref 9–46)
AST: 13 U/L (ref 10–35)
Alkaline Phosphatase: 50 U/L (ref 40–115)
BILIRUBIN DIRECT: 0.2 mg/dL (ref <=0.2)
BILIRUBIN INDIRECT: 0.5 mg/dL (ref 0.2–1.2)
Total Bilirubin: 0.7 mg/dL (ref 0.2–1.2)
Total Protein: 6.3 g/dL (ref 6.1–8.1)

## 2016-04-17 LAB — LIPID PANEL
CHOLESTEROL: 143 mg/dL (ref 125–200)
HDL: 63 mg/dL (ref >=40)
LDL Cholesterol: 60 mg/dL (ref <130)
Total CHOL/HDL Ratio: 2.3 Ratio (ref <=5.0)
Triglycerides: 99 mg/dL (ref <150)
VLDL: 20 mg/dL (ref <30)

## 2016-04-23 NOTE — Progress Notes (Signed)
Cardiology Office Note   Date:  04/24/2016   ID:  Zachary Hall, DOB 10/23/1934, MRN [id number]  PCP:  None Cardiologist: Kenli Waldo [name] MD  Chief Complaint  Patient presents with  . Follow-up    pt has questions about medicine       History of Present Illness: Zachary Hall is a 81 y.o. male who presents to establish cardiac follow up with me. He is a former patient of Dr. [name].  He has a history of known ischemic heart disease. He underwent coronary artery bypass graft surgery on 02/07/10 by Dr. Cyndia Bent. Last Myoview in February 2015 was normal with EF 66%. He has been doing well. He is having no recurrent angina. He has not been expressing any racing of his heart.  He has a history of a cough from lisinopril.  However, his cough has resolved on losartan.  He was last seen by Dr. [name] in 05/2015. He was having some problems with his memory and possible personality changes.He was referred to neurology. He did see Dr. Delice Lesch with neurology on 09/14/15 who checked some blood work and ordered a MRI, which showed chronic small vessel ischemic changes but no acute findings.  Past Medical History:  Diagnosis Date  . Bradycardia   . Coronary artery disease   . Gout   . History of atrial fibrillation    Previously on amiodarone  . Hx of CABG   . Hypercholesterolemia   . Hypertension   . Myocardial infarction ([place])   . Nocturia     Past Surgical History:  Procedure Laterality Date  . CARDIAC CATHETERIZATION  01/04/2010  . CORONARY ARTERY BYPASS GRAFT  02/07/2010   LIMA to LAD, SVG to 2nd DX, SVG to OM  . EYE SURGERY Bilateral    Cataract Extraction  . HEMORRHOID SURGERY     x 2  . HEMORRHOID SURGERY N/A 11/13/2012   Procedure: [place] Hemorrhoidectomy;  Surgeon: Merrie Roof, MD;  Location: [facility];  Service: General;  Laterality: N/A;  . NASAL POLYP EXCISION    . OTHER SURGICAL HISTORY  06/05/2002   Penile Implant     Current Outpatient Prescriptions    Medication Sig Dispense Refill  . amLODipine (NORVASC) 10 MG tablet Take 1 tablet (10 mg total) by mouth daily. 90 tablet 3  . aspirin 81 MG tablet Take 81 mg by mouth. Every Other Day    . atorvastatin (LIPITOR) 10 MG tablet Take 1 tablet (10 mg total) by mouth daily. 90 tablet 3  . Casanthranol-Docusate Sodium 30-100 MG CAPS Take 1 capsule by mouth 2 (two) times daily. Stool Softner    . Cetirizine HCl (ZYRTEC PO) Take 10 mg by mouth daily.     Zachary Hall CHERRY PO Take 1 capsule by mouth daily. ** Black Cherry **     . clobetasol ointment (TEMOVATE) AB-123456789 % Apply 1 application topically daily as needed. Skin irritation    . fluocinonide cream (LIDEX) AB-123456789 % Apply 1 application topically as needed (dry skin).     . fluorouracil (EFUDEX) 5 % cream Apply 1 application topically daily. To top of scalp    . Fluticasone-Salmeterol (ADVAIR DISKUS) 250-50 MCG/DOSE AEPB Inhale 1 puff into the lungs every 12 (twelve) hours as needed (as needed for wheezing). Reported on 09/14/2015    . hydrochlorothiazide (MICROZIDE) 12.5 MG capsule TAKE 1 CAPSULE (12.5 MG TOTAL) BY MOUTH DAILY. 90 capsule 3  . losartan (COZAAR) 50 MG tablet Take 1 tablet (50  mg total) by mouth daily. 90 tablet 3  . multivitamin (THERAGRAN) per tablet Take 1 tablet by mouth daily.      . nitroGLYCERIN (NITROSTAT) 0.4 MG SL tablet PLACE 1 TABLET (0.4 MG TOTAL) UNDER THE TONGUE EVERY 5 (FIVE) MINUTES AS NEEDED. FOR CHEST PAIN 25 tablet PRN  . Omega-3 Fatty Acids (OMEGA 3 PO) Take 1 capsule by mouth daily.       No current facility-administered medications for this visit.     Allergies:   Lisinopril    Social History:  The patient  reports that he quit smoking about 54 years ago. His smoking use included Cigarettes. He has quit using smokeless tobacco. He reports that he drinks alcohol. He reports that he does not use drugs.   Family History:  The patient's family history includes Alcohol abuse in his father; Cancer in his mother; Colon cancer  in his mother.    ROS:  Please see the history of present illness.   Otherwise, review of systems are positive for none.   All other systems are reviewed and negative.    PHYSICAL EXAM: VS:  BP 118/70 (BP Location: Right Arm, Patient Position: Sitting, Cuff Size: Normal)   Pulse (!) 56   Ht 5\' 11"  (1.803 m)   Wt 184 lb 3.2 oz (83.6 kg)   SpO2 96%   BMI 25.69 kg/m  , BMI Body mass index is 25.69 kg/m. GEN: Well nourished, well developed, in no acute distress  HEENT: normal  Neck: no JVD, carotid bruits, or masses Cardiac: RRR; no murmurs, rubs, or gallops,no edema  Respiratory:  clear to auscultation bilaterally, normal work of breathing GI: soft, nontender, nondistended, + BS MS: no deformity or atrophy  Skin: warm and dry, no rash Neuro:  Strength and sensation are intact Psych: euthymic mood, full affect   EKG:  EKG is not ordered today.    Recent Labs: 09/14/2015: TSH 2.39 04/17/2016: ALT 12; BUN 14; Creat 1.01; Potassium 3.8; Sodium 135    Lipid Panel    Component Value Date/Time   CHOL 143 04/17/2016 0940   TRIG 99 04/17/2016 0940   HDL 63 04/17/2016 0940   CHOLHDL 2.3 04/17/2016 0940   VLDL 20 04/17/2016 0940   LDLCALC 60 04/17/2016 0940      Wt Readings from Last 3 Encounters:  04/24/16 184 lb 3.2 oz (83.6 kg)  09/28/15 186 lb 12.8 oz (84.7 kg)  09/14/15 188 lb (85.3 kg)        ASSESSMENT AND PLAN:  1. Ischemic heart disease status post CABG. Patient is asymptomatic. Last Myoview in Feb. 2015 was normal. Consider repeat stress testing next year. 2. Bifascicular block 3. Hypercholesterolemia. Excellent control. 4. Remote history of paroxysmal atrial fibrillation 5. Benign hypertensive heart disease without heart failure. BP is well controlled. 6. Memory disorder 8.  History of colon polyps.  Most recent colonoscopy 03/19/2015.   Current medicines are reviewed at length with the patient today.  The patient does not have concerns regarding  medicines.  The following changes have been made:  no change  Labs/ tests ordered today include:   No orders of the defined types were placed in this encounter.   Disposition: Continue current medication.  Follow up 6-8 months.  Signed, Sury Wentworth [name] MD, [facility]   04/24/2016 10:08 AM    [place]

## 2016-04-24 ENCOUNTER — Encounter: Payer: Self-pay | Admitting: Cardiology

## 2016-04-24 ENCOUNTER — Ambulatory Visit (INDEPENDENT_AMBULATORY_CARE_PROVIDER_SITE_OTHER): Payer: BLUE CROSS/BLUE SHIELD | Admitting: Cardiology

## 2016-04-24 VITALS — BP 118/70 | HR 56 | Ht 71.0 in | Wt 184.2 lb

## 2016-04-24 DIAGNOSIS — I10 Essential (primary) hypertension: Secondary | ICD-10-CM

## 2016-04-24 DIAGNOSIS — Z951 Presence of aortocoronary bypass graft: Secondary | ICD-10-CM

## 2016-04-24 DIAGNOSIS — E7800 Pure hypercholesterolemia, unspecified: Secondary | ICD-10-CM | POA: Diagnosis not present

## 2016-04-24 DIAGNOSIS — I452 Bifascicular block: Secondary | ICD-10-CM

## 2016-04-24 DIAGNOSIS — I2510 Atherosclerotic heart disease of native coronary artery without angina pectoris: Secondary | ICD-10-CM

## 2016-04-24 NOTE — Patient Instructions (Signed)
Continue your current therapy  I will see you in 6-8 months. 

## 2016-06-15 ENCOUNTER — Other Ambulatory Visit: Payer: Self-pay

## 2016-06-15 MED ORDER — HYDROCHLOROTHIAZIDE 12.5 MG PO CAPS: ORAL_CAPSULE | ORAL | 3 refills | Status: DC

## 2016-06-15 MED ORDER — AMLODIPINE BESYLATE 10 MG PO TABS: 10.0000 mg | ORAL_TABLET | Freq: Every day | ORAL | 3 refills | Status: DC

## 2016-06-15 MED ORDER — ATORVASTATIN CALCIUM 10 MG PO TABS: 10.0000 mg | ORAL_TABLET | Freq: Every day | ORAL | 3 refills | Status: DC

## 2016-06-27 ENCOUNTER — Other Ambulatory Visit: Payer: Self-pay

## 2016-06-27 MED ORDER — NITROGLYCERIN 0.4 MG SL SUBL: SUBLINGUAL_TABLET | SUBLINGUAL | 99 refills | Status: DC

## 2016-08-02 ENCOUNTER — Other Ambulatory Visit: Payer: Self-pay | Admitting: *Deleted

## 2016-08-02 DIAGNOSIS — E7800 Pure hypercholesterolemia, unspecified: Secondary | ICD-10-CM

## 2016-08-02 DIAGNOSIS — Z79899 Other long term (current) drug therapy: Secondary | ICD-10-CM

## 2016-08-02 DIAGNOSIS — E785 Hyperlipidemia, unspecified: Secondary | ICD-10-CM

## 2016-08-23 ENCOUNTER — Other Ambulatory Visit: Payer: Self-pay

## 2016-08-23 MED ORDER — LOSARTAN POTASSIUM 50 MG PO TABS
50.0000 mg | ORAL_TABLET | Freq: Every day | ORAL | 3 refills | Status: DC
Start: 2016-08-23 — End: 2016-10-16

## 2016-09-13 ENCOUNTER — Ambulatory Visit: Payer: BLUE CROSS/BLUE SHIELD | Admitting: Neurology

## 2016-09-14 ENCOUNTER — Encounter: Payer: Self-pay | Admitting: Neurology

## 2016-09-29 ENCOUNTER — Encounter: Payer: Self-pay | Admitting: Neurology

## 2016-09-29 ENCOUNTER — Ambulatory Visit (INDEPENDENT_AMBULATORY_CARE_PROVIDER_SITE_OTHER): Payer: BLUE CROSS/BLUE SHIELD | Admitting: Neurology

## 2016-09-29 VITALS — BP 140/70 | HR 74 | Temp 98.4°F | Resp 16 | Ht 70.0 in | Wt 186.2 lb

## 2016-09-29 DIAGNOSIS — G3184 Mild cognitive impairment, so stated: Secondary | ICD-10-CM | POA: Insufficient documentation

## 2016-09-29 NOTE — Patient Instructions (Signed)
1. Continue control of blood pressure, cholesterol, as well as physical exercise and brain stimulation exercises for brain health 2. If memory changes continue to worsen, please call our office and we will plan to start medication Donepezil to help slow down the progression of worsening memory 3. Continue to monitor driving, do DMV evaluation if more driving concerns 4. Follow-up in 1 year, call for any changes

## 2016-09-29 NOTE — Progress Notes (Signed)
NEUROLOGY FOLLOW UP OFFICE NOTE  Zachary Hall [id number]  HISTORY OF PRESENT ILLNESS: I had the pleasure of seeing Zachary Hall in follow-up in the neurology clinic on 09/29/2016.  The patient was last seen a year ago for memory changes. He is again accompanied by his wife who helps supplement the history today.  Records and images were personally reviewed where available.  TSH and B12 normal. I personally reviewed MRI brain which did not show any acute changes, there was mild diffuse volume loss, moderate chronic microvascular disease. Since his last visit, he and his wife report his memory is good. His wife however shakes her head behind him and expresses concern about his driving and memory. During his visit, he tells me the story about their marriage, and thought he had told me earlier when it was my nurse he spoke to. She shakes her head and motions that this has been an issue. She states he is not a good driver as he used to be, sometimes he has more problems with directions and is not focused when driving. He states he is good with his medications, his wife fixes his pillbox but sometimes finds that a pill has been missed. His wife is in charge of bills. She has noticed he is a little more irritable. No difficulties with dressing/bathing, no paranoia/hallucinations. He denies any headaches, dizziness, diplopia, dysarthria, dysphagia, neck/back pain, focal numbness/tingling/weakness, bowel/bladder dysfunction, or tremors.   HPI 09/14/15: This is an 82 yo RH man with a history of CAD s/p CABG, bifascicular block, hyperlipidemia, remote history of paroxysmal atrial fibrillation, hypertension with worsening memory. He feels his memory is "pretty good," but does notice more short-term memory difficulties. He is a retired Customer service manager. His wife started noticing memory changes after his heart surgery in 2011, but feels that changes are more of a personality change. He is more irritable, sometimes ignoring  his wife even if she is speaking right in front of him ("selective hearing"). He occasionally repeats himself. He and his wife deny getting lost driving, but she feels like his mind wanders when he is driving, sometimes not paying attention to what is in front of him when he gets distracted. His wife reports changes are "lots of little changes," he would have difficulty changing from Plan A to Plan B, in the past he used to be quick to decide. His wife fixes his pillbox for him and sometimes notices he skips a day. She is in charge of bill payments. She denies any paranoia, but feels that he has low self-esteem, saying "oh I'm just a bad person," or "I can't do anything right." When he was in cardiac rehab, he reports that therapists kept asking him if he was depressed. He denies any family history of dementia, no significant head injuries. He has reduced alcohol intake but wife reports he drinks a small glass of Shearon Stalls every night.  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   PAST MEDICAL HISTORY: Past Medical History:  Diagnosis Date  . Bradycardia   . Coronary artery disease   . Gout   . History of atrial fibrillation    Previously on amiodarone  . Hx of CABG   . Hypercholesterolemia   . Hypertension   . Myocardial infarction   . Nocturia     MEDICATIONS: Current Outpatient Prescriptions on File Prior to Visit  Medication Sig Dispense Refill  . amLODipine (NORVASC) 10 MG tablet Take 1 tablet (10 mg total) by mouth  daily. 90 tablet 3  . aspirin 81 MG tablet Take 81 mg by mouth. Every Other Day    . atorvastatin (LIPITOR) 10 MG tablet Take 1 tablet (10 mg total) by mouth daily. 90 tablet 3  . Casanthranol-Docusate Sodium 30-100 MG CAPS Take 1 capsule by mouth 2 (two) times daily. Stool Softner    . Cetirizine HCl (ZYRTEC PO) Take 10 mg by mouth daily.     [name] CHERRY PO Take 1 capsule by mouth daily. ** Black Cherry **     . clobetasol ointment (TEMOVATE) AB-123456789 % Apply 1 application  topically daily as needed. Skin irritation    . fluocinonide cream (LIDEX) AB-123456789 % Apply 1 application topically as needed (dry skin).     . fluorouracil (EFUDEX) 5 % cream Apply 1 application topically daily. To top of scalp    . Fluticasone-Salmeterol (ADVAIR DISKUS) 250-50 MCG/DOSE AEPB Inhale 1 puff into the lungs every 12 (twelve) hours as needed (as needed for wheezing). Reported on 09/14/2015    . hydrochlorothiazide (MICROZIDE) 12.5 MG capsule TAKE 1 CAPSULE (12.5 MG TOTAL) BY MOUTH DAILY. 90 capsule 3  . losartan (COZAAR) 50 MG tablet Take 1 tablet (50 mg total) by mouth daily. 90 tablet 3  . multivitamin (THERAGRAN) per tablet Take 1 tablet by mouth daily.      . nitroGLYCERIN (NITROSTAT) 0.4 MG SL tablet PLACE 1 TABLET (0.4 MG TOTAL) UNDER THE TONGUE EVERY 5 (FIVE) MINUTES AS NEEDED. FOR CHEST PAIN 25 tablet PRN  . Omega-3 Fatty Acids (OMEGA 3 PO) Take 1 capsule by mouth daily.       No current facility-administered medications on file prior to visit.     ALLERGIES: Allergies  Allergen Reactions  . Lisinopril Other (See Comments)    Cough    FAMILY HISTORY: Family History  Problem Relation Age of Onset  . Alcohol abuse Father   . Colon cancer Mother   . Cancer Mother     colon and bone  . Heart attack Neg Hx   . Hypertension Neg Hx   . Stroke Neg Hx     SOCIAL HISTORY: Social History   Social History  . Marital status: Married    Spouse name: N/A  . Number of children: 1  . Years of education: N/A   Occupational History  . Retired    Social History Main Topics  . Smoking status: Former Smoker    Types: Cigarettes    Quit date: 01/07/1962  . Smokeless tobacco: Former Systems developer  . Alcohol use 0.0 oz/week     Comment: Daily  . Drug use: No  . Sexual activity: Not on file   Other Topics Concern  . Not on file   Social History Narrative  . No narrative on file    REVIEW OF SYSTEMS: Constitutional: No fevers, chills, or sweats, no generalized fatigue, change  in appetite Eyes: No visual changes, double vision, eye pain Ear, nose and throat: No hearing loss, ear pain, nasal congestion, sore throat Cardiovascular: No chest pain, palpitations Respiratory:  No shortness of breath at rest or with exertion, wheezes GastrointestinaI: No nausea, vomiting, diarrhea, abdominal pain, fecal incontinence Genitourinary:  No dysuria, urinary retention or frequency Musculoskeletal:  No neck pain, back pain Integumentary: No rash, pruritus, skin lesions Neurological: as above Psychiatric: No depression, insomnia, anxiety Endocrine: No palpitations, fatigue, diaphoresis, mood swings, change in appetite, change in weight, increased thirst Hematologic/Lymphatic:  No anemia, purpura, petechiae. Allergic/Immunologic: no itchy/runny eyes, nasal congestion, recent  allergic reactions, rashes  PHYSICAL EXAM: Vitals:   09/29/16 1341  BP: 140/70  Pulse: 74  Resp: 16  Temp: 98.4 F (36.9 C)   General: No acute distress Head:  Normocephalic/atraumatic Neck: supple, no paraspinal tenderness, full range of motion Heart:  Regular rate and rhythm Lungs:  Clear to auscultation bilaterally Back: No paraspinal tenderness Skin/Extremities: No rash, no edema Neurological Exam: alert and oriented to person, place, and time (missed date by 1 day). No aphasia or dysarthria. Fund of knowledge is appropriate.  Remote memory intact.  Attention and concentration are normal.    Able to name objects and repeat phrases. CDT 5/5  MMSE - Mini Mental State Exam 09/29/2016 09/14/2015  Orientation to time 4 5  Orientation to Place 5 5  Registration 3 3  Attention/ Calculation 5 4  Recall 0 2  Language- name 2 objects 2 2  Language- repeat 1 1  Language- follow 3 step command 3 3  Language- read & follow direction 1 1  Write a sentence 1 1  Copy design 1 1  Total score 26 28   Cranial nerves: Pupils equal, round, reactive to light.  Extraocular movements intact with no nystagmus.  Visual fields full. Facial sensation intact. No facial asymmetry. Tongue, uvula, palate midline.  Motor: Bulk and tone normal, muscle strength 5/5 throughout with no pronator drift.  Sensation to light touch intact.  No extinction to double simultaneous stimulation.  Deep tendon reflexes +1 throughout, toes downgoing.  Finger to nose testing intact.  Gait narrow-based and steady, able to tandem walk adequately.  Romberg negative.  IMPRESSION: This is a pleasant 82 yo RH man with vascular risk factors including hypertension, hyperlipidemia, CAD, paroxysmal atrial fibrillation, with worsening memory. His MMSE today is 26/30 (28/30 in February 2017), indicating mild cognitive impairment. MRI brain did not show any acute changes. Findings were discussed with the patient and his wife, we discussed starting donepezil, side effects and expectations from the medication. He would like to hold off for now. His wife motioned behind him that she was concerned about driving and memory, I advised having a DMV evaluation and to call any time they would like to proceed with starting donepezil. We again discussed the importance of control of vascular risk factors, physical exercise, and brain stimulation exercises for brain health. He will follow-up in 1 year and knows to call for any questions.    Thank you for allowing me to participate in the care of this patient. Please do not hesitate to call for any questions or concerns  Thank you for allowing me to participate in his care.  Please do not hesitate to call for any questions or concerns.  The duration of this appointment visit was 25 minutes of face-to-face time with the patient.  Greater than 50% of this time was spent in counseling, explanation of diagnosis, planning of further management, and coordination of care.   Ellouise Newer, M.D.

## 2016-10-15 NOTE — Progress Notes (Signed)
Cardiology Office Note   Date:  10/16/2016   ID:  Zachary Hall, DOB [birth year], MRN [id number]  PCP:  None Cardiologist: Jatinder Mcdonagh [name] MD  Chief Complaint  Patient presents with  . Follow-up    6 MONTHS      History of Present Illness: Zachary Hall is a 82 y.o. male is seen for follow up CAD.  He has a history of known ischemic heart disease. He underwent coronary artery bypass graft surgery on 02/07/10 by Dr. Cyndia Bent. Last Myoview in February 2015 was normal with EF 66%.   He does have some problems with his memory.He was seen by neurology on 09/14/15 who checked some blood work and ordered a MRI, which showed chronic small vessel ischemic changes but no acute findings.  On follow up today he is doing well. He stays active with his yard work and working with the [employer]. He denies any chest pain, palpitations, dyspnea or dizziness. Notes he may be slowing down a little.   Past Medical History:  Diagnosis Date  . Bradycardia   . Coronary artery disease   . Gout   . History of atrial fibrillation    Previously on amiodarone  . Hx of CABG   . Hypercholesterolemia   . Hypertension   . Myocardial infarction   . Nocturia     Past Surgical History:  Procedure Laterality Date  . CARDIAC CATHETERIZATION  01/04/2010  . CORONARY ARTERY BYPASS GRAFT  02/07/2010   LIMA to LAD, SVG to 2nd DX, SVG to OM  . EYE SURGERY Bilateral    Cataract Extraction  . HEMORRHOID SURGERY     x 2  . HEMORRHOID SURGERY N/A 11/13/2012   Procedure: [place] Hemorrhoidectomy;  Surgeon: Merrie Roof, MD;  Location: [place];  Service: General;  Laterality: N/A;  . NASAL POLYP EXCISION    . OTHER SURGICAL HISTORY  06/05/2002   Penile Implant     Current Outpatient Prescriptions  Medication Sig Dispense Refill  . amLODipine (NORVASC) 10 MG tablet Take 1 tablet (10 mg total) by mouth daily. 90 tablet 3  . aspirin 81 MG tablet Take 81 mg by mouth. Every Other Day    . atorvastatin (LIPITOR) 10  MG tablet Take 1 tablet (10 mg total) by mouth daily. 90 tablet 3  . Casanthranol-Docusate Sodium 30-100 MG CAPS Take 1 capsule by mouth 2 (two) times daily. Stool Softner    . Cetirizine HCl (ZYRTEC PO) Take 10 mg by mouth daily.     [name] CHERRY PO Take 2 capsules by mouth daily. ** Black Cherry **     . clobetasol ointment (TEMOVATE) 7.82 % Apply 1 application topically daily as needed. Skin irritation    . fluocinonide cream (LIDEX) 4.23 % Apply 1 application topically as needed (dry skin).     . fluorouracil (EFUDEX) 5 % cream Apply 1 application topically daily. To top of scalp    . Fluticasone-Salmeterol (ADVAIR DISKUS) 250-50 MCG/DOSE AEPB Inhale 1 puff into the lungs every 12 (twelve) hours as needed (as needed for wheezing). Reported on 09/14/2015    . hydrochlorothiazide (MICROZIDE) 12.5 MG capsule TAKE 1 CAPSULE (12.5 MG TOTAL) BY MOUTH DAILY. 90 capsule 3  . losartan (COZAAR) 50 MG tablet Take 1 tablet (50 mg total) by mouth daily. 90 tablet 3  . multivitamin (THERAGRAN) per tablet Take 1 tablet by mouth daily.      . nitroGLYCERIN (NITROSTAT) 0.4 MG SL tablet PLACE 1 TABLET (0.4  MG TOTAL) UNDER THE TONGUE EVERY 5 (FIVE) MINUTES AS NEEDED. FOR CHEST PAIN 25 tablet PRN  . Omega-3 Fatty Acids (OMEGA 3 PO) Take 1 capsule by mouth daily.       No current facility-administered medications for this visit.     Allergies:   Lisinopril    Social History:  The patient  reports that he quit smoking about 54 years ago. His smoking use included Cigarettes. He has quit using smokeless tobacco. He reports that he drinks alcohol. He reports that he does not use drugs.   Family History:  The patient's family history includes Alcohol abuse in his father; Cancer in his mother; Colon cancer in his mother.    ROS:  Please see the history of present illness.   Otherwise, review of systems are positive for none.   All other systems are reviewed and negative.    PHYSICAL EXAM: VS:  BP (!) 142/70 (BP  Location: Right Arm, Cuff Size: Normal)   Pulse (!) 50   Ht 5\' 10"  (1.778 m)   Wt 193 lb (87.5 kg)   BMI 27.69 kg/m  , BMI Body mass index is 27.69 kg/m. GEN: Well nourished, well developed, in no acute distress  HEENT: normal  Neck: no JVD, carotid bruits, or masses Cardiac: RRR; no murmurs, rubs, or gallops,no edema  Respiratory:  clear to auscultation bilaterally, normal work of breathing GI: soft, nontender, nondistended, + BS MS: no deformity or atrophy  Skin: warm and dry, no rash Neuro:  Strength and sensation are intact Psych: euthymic mood, full affect   EKG:  EKG  ordered today. NSR with rate 50 bpm. LAD, RBBB. No acute change. I have personally reviewed and interpreted this study.  Recent Labs: 04/17/2016: ALT 12; BUN 14; Creat 1.01; Potassium 3.8; Sodium 135    Lipid Panel    Component Value Date/Time   CHOL 143 04/17/2016 0940   TRIG 99 04/17/2016 0940   HDL 63 04/17/2016 0940   CHOLHDL 2.3 04/17/2016 0940   VLDL 20 04/17/2016 0940   LDLCALC 60 04/17/2016 0940      Wt Readings from Last 3 Encounters:  10/16/16 193 lb (87.5 kg)  09/29/16 186 lb 3.2 oz (84.5 kg)  04/24/16 184 lb 3.2 oz (83.6 kg)     ASSESSMENT AND PLAN:  1. Ischemic heart disease status post CABG. Patient is asymptomatic. Last Myoview in Feb. 2015 was normal. Continue current medical therapy.  2. Bifascicular block- chronic- asymptomatic.  3. Hypercholesterolemia. Will repeat lab work today. 4. Remote history of paroxysmal atrial fibrillation 5. Benign hypertensive heart disease without heart failure. BP is under fair control.  6. Memory disorder  Current medicines are reviewed at length with the patient today.  The patient does not have concerns regarding medicines.  The following changes have been made:  no change  Labs/ tests ordered today include:   Orders Placed This Encounter  Procedures  . CBC w/Diff/Platelet  . Hepatic function panel  . Lipid panel  . Basic Metabolic  Panel (BMET)  . EKG 12-Lead    Disposition: Continue current medication.  Follow up 6 months.  Signed, [name] MD, [facility]   10/16/2016 2:09 PM    [place]

## 2016-10-16 ENCOUNTER — Encounter: Payer: Self-pay | Admitting: Cardiology

## 2016-10-16 ENCOUNTER — Other Ambulatory Visit: Payer: Self-pay | Admitting: *Deleted

## 2016-10-16 ENCOUNTER — Ambulatory Visit (INDEPENDENT_AMBULATORY_CARE_PROVIDER_SITE_OTHER): Payer: BLUE CROSS/BLUE SHIELD | Admitting: Cardiology

## 2016-10-16 VITALS — BP 142/70 | HR 50 | Ht 70.0 in | Wt 193.0 lb

## 2016-10-16 DIAGNOSIS — E7800 Pure hypercholesterolemia, unspecified: Secondary | ICD-10-CM

## 2016-10-16 DIAGNOSIS — I10 Essential (primary) hypertension: Secondary | ICD-10-CM

## 2016-10-16 DIAGNOSIS — I452 Bifascicular block: Secondary | ICD-10-CM | POA: Diagnosis not present

## 2016-10-16 DIAGNOSIS — Z951 Presence of aortocoronary bypass graft: Secondary | ICD-10-CM

## 2016-10-16 DIAGNOSIS — I2510 Atherosclerotic heart disease of native coronary artery without angina pectoris: Secondary | ICD-10-CM | POA: Diagnosis not present

## 2016-10-16 LAB — CBC WITH DIFFERENTIAL/PLATELET
BASOS ABS: 0 {cells}/uL (ref 0–200)
Basophils Relative: 0 %
EOS PCT: 5 %
Eosinophils Absolute: 440 cells/uL (ref 15–500)
HCT: 39.3 % (ref 38.5–50.0)
HEMOGLOBIN: 13.3 g/dL (ref 13.2–17.1)
LYMPHS ABS: 2728 {cells}/uL (ref 850–3900)
Lymphocytes Relative: 31 %
MCH: 33.2 pg — AB (ref 27.0–33.0)
MCHC: 33.8 g/dL (ref 32.0–36.0)
MCV: 98 fL (ref 80.0–100.0)
MONOS PCT: 9 %
MPV: 10 fL (ref 7.5–12.5)
Monocytes Absolute: 792 cells/uL (ref 200–950)
NEUTROS ABS: 4840 {cells}/uL (ref 1500–7800)
Neutrophils Relative %: 55 %
PLATELETS: 193 10*3/uL (ref 140–400)
RBC: 4.01 MIL/uL — AB (ref 4.20–5.80)
RDW: 13.3 % (ref 11.0–15.0)
WBC: 8.8 10*3/uL (ref 3.8–10.8)

## 2016-10-16 LAB — HEPATIC FUNCTION PANEL
ALBUMIN: 4 g/dL (ref 3.6–5.1)
ALK PHOS: 53 U/L (ref 40–115)
ALT: 12 U/L (ref 9–46)
AST: 12 U/L (ref 10–35)
BILIRUBIN DIRECT: 0.1 mg/dL (ref <=0.2)
BILIRUBIN TOTAL: 0.4 mg/dL (ref 0.2–1.2)
Indirect Bilirubin: 0.3 mg/dL (ref 0.2–1.2)
Total Protein: 6 g/dL — ABNORMAL LOW (ref 6.1–8.1)

## 2016-10-16 LAB — LIPID PANEL
CHOL/HDL RATIO: 2.7 ratio (ref <5.0)
CHOLESTEROL: 124 mg/dL (ref <200)
HDL: 46 mg/dL (ref >40)
LDL Cholesterol: 46 mg/dL (ref <100)
Triglycerides: 158 mg/dL — ABNORMAL HIGH (ref <150)
VLDL: 32 mg/dL — AB (ref <30)

## 2016-10-16 LAB — BASIC METABOLIC PANEL
BUN: 13 mg/dL (ref 7–25)
CHLORIDE: 102 mmol/L (ref 98–110)
CO2: 29 mmol/L (ref 20–31)
Calcium: 8.9 mg/dL (ref 8.6–10.3)
Creat: 1.26 mg/dL — ABNORMAL HIGH (ref 0.70–1.11)
GLUCOSE: 104 mg/dL — AB (ref 65–99)
POTASSIUM: 3.9 mmol/L (ref 3.5–5.3)
SODIUM: 140 mmol/L (ref 135–146)

## 2016-10-16 MED ORDER — LOSARTAN POTASSIUM 50 MG PO TABS: 50.0000 mg | ORAL_TABLET | Freq: Every day | ORAL | 3 refills | Status: DC

## 2016-10-16 MED ORDER — AMLODIPINE BESYLATE 10 MG PO TABS: 10.0000 mg | ORAL_TABLET | Freq: Every day | ORAL | 3 refills | Status: DC

## 2016-10-16 MED ORDER — NITROGLYCERIN 0.4 MG SL SUBL: SUBLINGUAL_TABLET | SUBLINGUAL | 11 refills | Status: DC

## 2016-10-16 MED ORDER — HYDROCHLOROTHIAZIDE 12.5 MG PO CAPS: ORAL_CAPSULE | ORAL | 3 refills | Status: DC

## 2016-10-16 MED ORDER — ATORVASTATIN CALCIUM 10 MG PO TABS
10.0000 mg | ORAL_TABLET | Freq: Every day | ORAL | 3 refills | Status: DC
Start: 2016-10-16 — End: 2017-12-09

## 2016-10-16 NOTE — Patient Instructions (Addendum)
We will refill your medication and check blood work today  Continue your current therapy  I will see you in 6 months

## 2016-12-07 DIAGNOSIS — K4030 Unilateral inguinal hernia, with obstruction, without gangrene, not specified as recurrent: Secondary | ICD-10-CM | POA: Diagnosis not present

## 2017-01-08 ENCOUNTER — Ambulatory Visit: Payer: Self-pay | Admitting: Surgery

## 2017-01-08 DIAGNOSIS — K4090 Unilateral inguinal hernia, without obstruction or gangrene, not specified as recurrent: Secondary | ICD-10-CM | POA: Diagnosis not present

## 2017-01-10 ENCOUNTER — Telehealth: Payer: Self-pay | Admitting: Cardiology

## 2017-01-10 NOTE — Telephone Encounter (Signed)
Spoke with [facility], they report the form was faxed to the office today. Aware dr [name] will be in the office the next 2 days and will return form once signed.

## 2017-01-10 NOTE — Telephone Encounter (Signed)
Zachary Hall wanted to check on the status of his surgical clearance that she faxed over on Monday. Please send this back asap please.

## 2017-01-12 ENCOUNTER — Telehealth: Payer: Self-pay

## 2017-01-12 NOTE — Telephone Encounter (Signed)
Received surgical clearance from [facility].Dr.Jordan cleared patient for upcoming hernia surgery.Form faxed back to fax # [phone].

## 2017-01-19 ENCOUNTER — Encounter (HOSPITAL_COMMUNITY): Payer: Self-pay | Admitting: *Deleted

## 2017-01-19 NOTE — Progress Notes (Signed)
Mr Zachary Hall denies chest pain or shortness of breath. I instructed patient to stop vitamins and Herbal products until instructed to continue after surgery.

## 2017-01-20 MED ORDER — CEFAZOLIN SODIUM-DEXTROSE 2-4 GM/100ML-% IV SOLN
2.0000 g | INTRAVENOUS | Status: AC
  Administered 2017-01-22: 2 g via INTRAVENOUS
  Filled 2017-01-20: qty 100

## 2017-01-21 ENCOUNTER — Encounter (HOSPITAL_COMMUNITY): Payer: Self-pay | Admitting: Surgery

## 2017-01-21 DIAGNOSIS — K4090 Unilateral inguinal hernia, without obstruction or gangrene, not specified as recurrent: Secondary | ICD-10-CM

## 2017-01-21 NOTE — H&P (Signed)
General Surgery [facility]  Zachary Hall 01/08/2017 3:45 PM Location: [facility] Patient #: [id number] DOB: July 18, 1935 Married / Language: [name] / Race: White Male   History of Present Illness Earnstine Regal MD; 01/08/2017 4:45 PM) The patient is a 82 year old male who presents with an inguinal hernia. CC: right inguinal hernia  Patient is referred by Dr. Katrine Coho for evaluation and treatment of right inguinal hernia. Patient reports having developed sudden onset of right inguinal pain after lifting a bag of putting soil approximately 3 weeks ago. Patient has had intermittent discomfort with physical activity. He has noted a bulge on the right side. This has been reducible when recumbent. Patient does have problems with chronic constipation but as noted no significant change in his bowel habits over the past few weeks. He has had no symptoms on the left side. No prior abdominal surgery. 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 Patient has had no prior hernia repairs.   Past Surgical History Mammie Lorenzo, LPN; 2/84/1324 4:01 PM) Bypass Surgery for Poor Blood Flow to Legs  Cataract Surgery  Bilateral. Colon Polyp Removal - Colonoscopy  Colon Polyp Removal - Open  Coronary Artery Bypass Graft  Oral Surgery  Vasectomy   Diagnostic Studies History Mammie Lorenzo, LPN; 0/27/2536 6:44 PM) Colonoscopy  1-5 years ago  Allergies Mammie Lorenzo, LPN; 0/34/7425 9:56 PM) No Known Allergies 01/08/2017 Allergies Reconciled   Medication History Mammie Lorenzo, LPN; 3/87/5643 3:29 PM) AmLODIPine Besylate (10MG  Tablet, Oral) Active. Losartan Potassium (50MG  Tablet, Oral) Active. Atorvastatin Calcium (10MG  Tablet, Oral) Active. HydroCHLOROthiazide (12.5MG  Capsule, Oral) Active. Nitroglycerin (0.4MG  Tab Sublingual, Sublingual) Active. Aspirin (81MG  Tablet DR, Oral) Active. Omega-3 (300MG  Capsule, Oral)  Active. Cherry Concentrate (Oral) Active. Multivitamin Adult (Oral) Active. Medications Reconciled  Social History Mammie Lorenzo, LPN; 12/16/8414 6:06 PM) Alcohol use  Moderate alcohol use. Caffeine use  Carbonated beverages, Tea. No drug use  Tobacco use  Former smoker.  Family History Mammie Lorenzo, LPN; 09/28/6008 9:32 PM) Alcohol Abuse  Father, Son. Rectal Cancer  Mother.  Other Problems Mammie Lorenzo, LPN; 3/55/7322 0:25 PM) Hemorrhoids  High blood pressure  Hypercholesterolemia  Other disease, cancer, significant illness  Vascular Disease     Review of Systems Claiborne Billings Dockery LPN; 11/24/621 7:62 PM) General Not Present- Appetite Loss, Chills, Fatigue, Fever, Night Sweats, Weight Gain and Weight Loss. HEENT Present- Hearing Loss, Seasonal Allergies and Wears glasses/contact lenses. Not Present- Earache, Hoarseness, Nose Bleed, Oral Ulcers, Ringing in the Ears, Sinus Pain, Sore Throat, Visual Disturbances and Yellow Eyes. Respiratory Present- Snoring and Wheezing. Not Present- Bloody sputum, Chronic Cough and Difficulty Breathing. Breast Not Present- Breast Mass, Breast Pain, Nipple Discharge and Skin Changes. Gastrointestinal Present- Constipation. Not Present- Abdominal Pain, Bloating, Bloody Stool, Change in Bowel Habits, Chronic diarrhea, Difficulty Swallowing, Excessive gas, Gets full quickly at meals, Hemorrhoids, Indigestion, Nausea, Rectal Pain and Vomiting. Musculoskeletal Not Present- Back Pain, Joint Pain, Joint Stiffness, Muscle Pain, Muscle Weakness and Swelling of Extremities. Neurological Present- Decreased Memory. Not Present- Fainting, Headaches, Numbness, Seizures, Tingling, Tremor, Trouble walking and Weakness. Psychiatric Not Present- Anxiety, Bipolar, Change in Sleep Pattern, Depression, Fearful and Frequent crying. Endocrine Present- Cold Intolerance. Not Present- Excessive Hunger, Hair Changes, Heat Intolerance, Hot flashes and New  Diabetes.  Vitals Claiborne Billings Dockery LPN; 03/31/5175 1:60 PM) 01/08/2017 3:46 PM Weight: 187.8 lb Height: 71in Body Surface Area: 2.05 m Body Mass Index: 26.19 kg/m  Temp.: 97.56F(Oral)  Pulse: 46 (Regular)  BP:  122/76 (Sitting, Left Arm, Standard)       Physical Exam Earnstine Regal MD; 01/08/2017 4:46 PM) The physical exam findings are as follows: Note:CONSTITUTIONAL See vital signs recorded above  GENERAL APPEARANCE Development: normal Nutritional status: normal Gross deformities: none  SKIN Rash, lesions, ulcers: none Induration, erythema: none Nodules: none palpable  EYES Conjunctiva and lids: normal Pupils: equal and reactive Iris: normal bilaterally  EARS, NOSE, MOUTH, THROAT External ears: no lesion or deformity External nose: no lesion or deformity Hearing: grossly normal Lips: no lesion or deformity Dentition: normal for age Oral mucosa: moist  NECK Symmetric: yes Trachea: midline Thyroid: No palpable nodules  CHEST Respiratory effort: normal Retraction or accessory muscle use: no Breath sounds: normal bilaterally Rales, rhonchi, wheeze: none  CARDIOVASCULAR Auscultation: regular rhythm, normal rate Murmurs: none Pulses: carotid and radial pulse 2+ palpable Lower extremity edema: none Lower extremity varicosities: none  ABDOMEN Distension: none Masses: none palpable Tenderness: none Hepatosplenomegaly: not present Hernia: No sign of umbilical hernia  GENITOURINARY Penis: Penile prosthesis in place Scrotum: no masses Obvious bulge right inguinal canal area palpation with cough and Valsalva shows a moderate sized right inguinal hernia which is reducible. Mild tenderness. Palpation in the left inguinal canal shows some laxity of the inguinal floor with cough and Valsalva but no persistent bulge.  MUSCULOSKELETAL Station and gait: normal Digits and nails: no clubbing or cyanosis Muscle strength: grossly normal all  extremities Range of motion: grossly normal all extremities Deformity: none  LYMPHATIC Cervical: none palpable Supraclavicular: none palpable  PSYCHIATRIC Oriented to person, place, and time: yes Mood and affect: normal for situation Judgment and insight: appropriate for situation    Assessment & Plan Earnstine Regal MD; 01/08/2017 4:48 PM) INGUINAL HERNIA OF RIGHT SIDE WITHOUT OBSTRUCTION OR GANGRENE (K40.90) Current Plans Pt Education - Pamphlet Given - Hernia Surgery: discussed with patient and provided information. Patient presents on referral from his urologist with a newly diagnosed right inguinal hernia. Patient is accompanied by his wife. I have provided them with written literature on hernia repair to review at home.  Patient has a right inguinal hernia which is reducible with some discomfort. I have recommended open repair with mesh patch. We have discussed the procedure. We have discussed the risk of recurrence. We have discussed restrictions on his activities after surgery. Patient understands and agrees to proceed with surgery in the near future.  Given the patient's previous cardiac history, we will ask for cardiac clearance from his cardiologist, Dr. Peter [name].  Patient may have this performed as an outpatient surgical procedure, but I would do so in the main hospital and not in one of the outpatient surgical Center's. Patient and his wife are in agreement.  Earnstine Regal, MD, [facility] Office: [phone]

## 2017-01-22 ENCOUNTER — Encounter (HOSPITAL_COMMUNITY)
Admission: RE | Disposition: A | Discharge status: Home / Self Care | Payer: Self-pay | Source: Ambulatory Visit | Attending: Surgery

## 2017-01-22 ENCOUNTER — Ambulatory Visit (HOSPITAL_COMMUNITY): Payer: BLUE CROSS/BLUE SHIELD | Admitting: Certified Registered Nurse Anesthetist

## 2017-01-22 ENCOUNTER — Encounter (HOSPITAL_COMMUNITY): Payer: Self-pay | Admitting: *Deleted

## 2017-01-22 ENCOUNTER — Ambulatory Visit (HOSPITAL_COMMUNITY)
Admission: RE | Admit: 2017-01-22 | Discharge: 2017-01-22 | Disposition: A | Discharge status: Home / Self Care | Payer: BLUE CROSS/BLUE SHIELD | Source: Ambulatory Visit | Attending: Surgery | Admitting: Surgery

## 2017-01-22 DIAGNOSIS — I10 Essential (primary) hypertension: Secondary | ICD-10-CM | POA: Insufficient documentation

## 2017-01-22 DIAGNOSIS — I4510 Unspecified right bundle-branch block: Secondary | ICD-10-CM | POA: Insufficient documentation

## 2017-01-22 DIAGNOSIS — Z87891 Personal history of nicotine dependence: Secondary | ICD-10-CM | POA: Insufficient documentation

## 2017-01-22 DIAGNOSIS — I252 Old myocardial infarction: Secondary | ICD-10-CM | POA: Diagnosis not present

## 2017-01-22 DIAGNOSIS — I2510 Atherosclerotic heart disease of native coronary artery without angina pectoris: Secondary | ICD-10-CM | POA: Diagnosis not present

## 2017-01-22 DIAGNOSIS — Z79899 Other long term (current) drug therapy: Secondary | ICD-10-CM | POA: Diagnosis not present

## 2017-01-22 DIAGNOSIS — E785 Hyperlipidemia, unspecified: Secondary | ICD-10-CM | POA: Insufficient documentation

## 2017-01-22 DIAGNOSIS — Z7982 Long term (current) use of aspirin: Secondary | ICD-10-CM | POA: Diagnosis not present

## 2017-01-22 DIAGNOSIS — K4090 Unilateral inguinal hernia, without obstruction or gangrene, not specified as recurrent: Secondary | ICD-10-CM | POA: Diagnosis not present

## 2017-01-22 DIAGNOSIS — E7800 Pure hypercholesterolemia, unspecified: Secondary | ICD-10-CM | POA: Insufficient documentation

## 2017-01-22 DIAGNOSIS — Z951 Presence of aortocoronary bypass graft: Secondary | ICD-10-CM | POA: Insufficient documentation

## 2017-01-22 DIAGNOSIS — G8918 Other acute postprocedural pain: Secondary | ICD-10-CM | POA: Diagnosis not present

## 2017-01-22 HISTORY — PX: INSERTION OF MESH: SHX5868

## 2017-01-22 HISTORY — PX: INGUINAL HERNIA REPAIR: SHX194

## 2017-01-22 LAB — BASIC METABOLIC PANEL
Anion gap: 6 (ref 5–15)
BUN: 14 mg/dL (ref 6–20)
CALCIUM: 8.8 mg/dL — AB (ref 8.9–10.3)
CO2: 28 mmol/L (ref 22–32)
CREATININE: 1.13 mg/dL (ref 0.61–1.24)
Chloride: 104 mmol/L (ref 101–111)
GFR calc Af Amer: >60 mL/min (ref >60)
GFR calc non Af Amer: 59 mL/min — ABNORMAL LOW (ref >60)
Glucose, Bld: 95 mg/dL (ref 65–99)
Potassium: 3.1 mmol/L — ABNORMAL LOW (ref 3.5–5.1)
Sodium: 138 mmol/L (ref 135–145)

## 2017-01-22 LAB — CBC
HCT: 37.9 % — ABNORMAL LOW (ref 39.0–52.0)
Hemoglobin: 13.2 g/dL (ref 13.0–17.0)
MCH: 32.9 pg (ref 26.0–34.0)
MCHC: 34.8 g/dL (ref 30.0–36.0)
MCV: 94.5 fL (ref 78.0–100.0)
PLATELETS: 173 10*3/uL (ref 150–400)
RBC: 4.01 MIL/uL — AB (ref 4.22–5.81)
RDW: 12.8 % (ref 11.5–15.5)
WBC: 7.8 10*3/uL (ref 4.0–10.5)

## 2017-01-22 SURGERY — REPAIR, HERNIA, INGUINAL, ADULT
Anesthesia: Regional | Site: Inguinal | Laterality: Right

## 2017-01-22 MED ORDER — 0.9 % SODIUM CHLORIDE (POUR BTL) OPTIME
TOPICAL | Status: DC | PRN
  Administered 2017-01-22: 1000 mL

## 2017-01-22 MED ORDER — ROPIVACAINE HCL 5 MG/ML IJ SOLN
INTRAMUSCULAR | Status: DC | PRN
  Administered 2017-01-22: 30 mL via PERINEURAL

## 2017-01-22 MED ORDER — DIPHENHYDRAMINE HCL 50 MG/ML IJ SOLN
INTRAMUSCULAR | Status: AC
  Filled 2017-01-22: qty 1

## 2017-01-22 MED ORDER — ONDANSETRON HCL 4 MG/2ML IJ SOLN: 4.0000 mg | Freq: Once | INTRAMUSCULAR | Status: DC | PRN

## 2017-01-22 MED ORDER — BUPIVACAINE HCL (PF) 0.5 % IJ SOLN
INTRAMUSCULAR | Status: AC
  Filled 2017-01-22: qty 30

## 2017-01-22 MED ORDER — CHLORHEXIDINE GLUCONATE CLOTH 2 % EX PADS: 6.0000 | MEDICATED_PAD | Freq: Once | CUTANEOUS | Status: DC

## 2017-01-22 MED ORDER — GLYCOPYRROLATE 0.2 MG/ML IJ SOLN
INTRAMUSCULAR | Status: DC | PRN
  Administered 2017-01-22: 0.4 mg via INTRAVENOUS

## 2017-01-22 MED ORDER — EPHEDRINE SULFATE 50 MG/ML IJ SOLN
INTRAMUSCULAR | Status: DC | PRN
  Administered 2017-01-22 (×2): 10 mg via INTRAVENOUS

## 2017-01-22 MED ORDER — DEXAMETHASONE SODIUM PHOSPHATE 10 MG/ML IJ SOLN
INTRAMUSCULAR | Status: DC | PRN
  Administered 2017-01-22: 5 mg via INTRAVENOUS

## 2017-01-22 MED ORDER — SUGAMMADEX SODIUM 200 MG/2ML IV SOLN
INTRAVENOUS | Status: AC
  Filled 2017-01-22: qty 2

## 2017-01-22 MED ORDER — ONDANSETRON HCL 4 MG/2ML IJ SOLN
INTRAMUSCULAR | Status: DC | PRN
  Administered 2017-01-22: 4 mg via INTRAVENOUS

## 2017-01-22 MED ORDER — LACTATED RINGERS IV SOLN
INTRAVENOUS | Status: DC
  Administered 2017-01-22 (×2): via INTRAVENOUS

## 2017-01-22 MED ORDER — FENTANYL CITRATE (PF) 250 MCG/5ML IJ SOLN
INTRAMUSCULAR | Status: AC
  Filled 2017-01-22: qty 5

## 2017-01-22 MED ORDER — DEXAMETHASONE SODIUM PHOSPHATE 10 MG/ML IJ SOLN
INTRAMUSCULAR | Status: AC
  Filled 2017-01-22: qty 1

## 2017-01-22 MED ORDER — FENTANYL CITRATE (PF) 100 MCG/2ML IJ SOLN: 25.0000 ug | INTRAMUSCULAR | Status: DC | PRN

## 2017-01-22 MED ORDER — BUPIVACAINE HCL 0.5 % IJ SOLN
INTRAMUSCULAR | Status: DC | PRN
  Administered 2017-01-22: 20 mL

## 2017-01-22 MED ORDER — HYDROCODONE-ACETAMINOPHEN 5-325 MG PO TABS: 1.0000 | ORAL_TABLET | ORAL | 0 refills | Status: DC | PRN

## 2017-01-22 MED ORDER — ONDANSETRON HCL 4 MG/2ML IJ SOLN
INTRAMUSCULAR | Status: AC
  Filled 2017-01-22: qty 2

## 2017-01-22 MED ORDER — FENTANYL CITRATE (PF) 100 MCG/2ML IJ SOLN
INTRAMUSCULAR | Status: DC | PRN
  Administered 2017-01-22 (×3): 25 ug via INTRAVENOUS

## 2017-01-22 MED ORDER — PROPOFOL 10 MG/ML IV BOLUS
INTRAVENOUS | Status: AC
  Filled 2017-01-22: qty 20

## 2017-01-22 MED ORDER — PROPOFOL 10 MG/ML IV BOLUS
INTRAVENOUS | Status: DC | PRN
  Administered 2017-01-22: 150 mg via INTRAVENOUS

## 2017-01-22 MED ORDER — LIDOCAINE HCL (CARDIAC) 20 MG/ML IV SOLN
INTRAVENOUS | Status: DC | PRN
  Administered 2017-01-22: 60 mg via INTRAVENOUS

## 2017-01-22 MED ORDER — FENTANYL CITRATE (PF) 100 MCG/2ML IJ SOLN
INTRAMUSCULAR | Status: AC
  Administered 2017-01-22: 50 ug
  Filled 2017-01-22: qty 2

## 2017-01-22 SURGICAL SUPPLY — 46 items
BLADE CLIPPER SURG (BLADE) IMPLANT
CANISTER SUCT 3000ML PPV (MISCELLANEOUS) IMPLANT
CHLORAPREP W/TINT 26ML (MISCELLANEOUS) ×3 IMPLANT
CLOSURE STERI-STRIP 1/2X4 (GAUZE/BANDAGES/DRESSINGS) ×1
CLOSURE WOUND 1/2 X4 (GAUZE/BANDAGES/DRESSINGS) ×1
CLSR STERI-STRIP ANTIMIC 1/2X4 (GAUZE/BANDAGES/DRESSINGS) ×2 IMPLANT
COVER SURGICAL LIGHT HANDLE (MISCELLANEOUS) ×3 IMPLANT
DECANTER SPIKE VIAL GLASS SM (MISCELLANEOUS) ×3 IMPLANT
DRAIN PENROSE 1/2X12 LTX STRL (WOUND CARE) IMPLANT
DRAPE LAPAROTOMY TRNSV 102X78 (DRAPE) ×3 IMPLANT
DRAPE UTILITY XL STRL (DRAPES) ×3 IMPLANT
ELECT CAUTERY BLADE 6.4 (BLADE) ×3 IMPLANT
ELECT REM PT RETURN 9FT ADLT (ELECTROSURGICAL) ×3
ELECTRODE REM PT RTRN 9FT ADLT (ELECTROSURGICAL) ×1 IMPLANT
GAUZE SPONGE 4X4 12PLY STRL (GAUZE/BANDAGES/DRESSINGS) ×3 IMPLANT
GLOVE SURG ORTHO 8.0 STRL STRW (GLOVE) ×3 IMPLANT
GOWN STRL REUS W/ TWL LRG LVL3 (GOWN DISPOSABLE) ×1 IMPLANT
GOWN STRL REUS W/ TWL XL LVL3 (GOWN DISPOSABLE) ×1 IMPLANT
GOWN STRL REUS W/TWL LRG LVL3 (GOWN DISPOSABLE) ×3
GOWN STRL REUS W/TWL XL LVL3 (GOWN DISPOSABLE) ×3
KIT BASIN OR (CUSTOM PROCEDURE TRAY) ×3 IMPLANT
KIT ROOM TURNOVER OR (KITS) ×3 IMPLANT
MESH ULTRAPRO 3X6 7.6X15CM (Mesh General) ×3 IMPLANT
NEEDLE HYPO 25GX1X1/2 BEV (NEEDLE) ×6 IMPLANT
NS IRRIG 1000ML POUR BTL (IV SOLUTION) ×3 IMPLANT
PACK SURGICAL SETUP 50X90 (CUSTOM PROCEDURE TRAY) ×3 IMPLANT
PAD ARMBOARD 7.5X6 YLW CONV (MISCELLANEOUS) ×3 IMPLANT
PENCIL BUTTON HOLSTER BLD 10FT (ELECTRODE) ×3 IMPLANT
SPECIMEN JAR SMALL (MISCELLANEOUS) IMPLANT
SPONGE LAP 18X18 X RAY DECT (DISPOSABLE) ×3 IMPLANT
STRIP CLOSURE SKIN 1/2X4 (GAUZE/BANDAGES/DRESSINGS) ×2 IMPLANT
SUT MNCRL AB 4-0 PS2 18 (SUTURE) ×3 IMPLANT
SUT NOVA NAB GS-21 0 18 T12 DT (SUTURE) ×3 IMPLANT
SUT NOVA NAB GS-22 2 0 T19 (SUTURE) ×6 IMPLANT
SUT SILK 2 0 SH (SUTURE) ×3 IMPLANT
SUT SILK 3 0 (SUTURE) ×3
SUT SILK 3-0 18XBRD TIE 12 (SUTURE) ×1 IMPLANT
SUT VIC AB 3-0 SH 18 (SUTURE) ×3 IMPLANT
SYR BULB 3OZ (MISCELLANEOUS) ×3 IMPLANT
SYR CONTROL 10ML LL (SYRINGE) ×6 IMPLANT
TAPE CLOTH SURG 4X10 WHT LF (GAUZE/BANDAGES/DRESSINGS) ×3 IMPLANT
TOWEL OR 17X24 6PK STRL BLUE (TOWEL DISPOSABLE) ×3 IMPLANT
TOWEL OR 17X26 10 PK STRL BLUE (TOWEL DISPOSABLE) ×3 IMPLANT
TUBE CONNECTING 12'X1/4 (SUCTIONS)
TUBE CONNECTING 12X1/4 (SUCTIONS) IMPLANT
YANKAUER SUCT BULB TIP NO VENT (SUCTIONS) IMPLANT

## 2017-01-22 NOTE — Op Note (Signed)
Inguinal Hernia, Open, Procedure Note  Pre-operative Diagnosis:  Right inguinal hernia, reducible  Post-operative Diagnosis: same  Surgeon:  Earnstine Regal, MD, FACS  Anesthesia:  General  Preparation:  Chlora-prep  Estimated Blood Loss: Minimal  Complications:  none  Indications: The patient presented with a right, reducible hernia.    The patient is a 82 year old male who presents with an inguinal hernia.  Patient is referred by Dr. Katrine Coho for evaluation and treatment of right inguinal hernia. Patient reports having developed sudden onset of right inguinal pain after lifting a bag of putting soil approximately 3 weeks ago. Patient has had intermittent discomfort with physical activity. He has noted a bulge on the right side. This has been reducible when recumbent. Patient does have problems with chronic constipation but as noted no significant change in his bowel habits over the past few weeks. He has had no symptoms on the left side. No prior abdominal surgery. 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 Patient has had no prior hernia repairs.  Procedure Details  The patient was evaluated in the holding area. All of the patient's questions were answered and the proposed procedure was confirmed. The site of the procedure was properly marked. The patient was taken to the Operating Room, identified by name, and the procedure verified as inguinal hernia repair.  The patient was placed in the supine position and underwent induction of anesthesia. A "Time Out" was performed per routine. The lower abdomen and groin were prepped and draped in the usual aseptic fashion.  After ascertaining that an adequate level of anesthesia had been obtained, an incision was made in the groin with a #10 blade.  Dissection was carried through the subcutaneous tissues and hemostasis obtained with the electrocautery.  A Gelpi retractor was placed for exposure.  The external oblique  fascia was incised in line with it's fibers and extended through the external inguinal ring.  The cord structures were dissected out of the inguinal canal and encircled with a Penrose drain.  The floor of the inguinal canal was dissected out.  There was a moderate sized direct inguinal hernia which was closed with interrupted 0-Novofil sutures.  The cord was explored and a moderate sized hernia sac was dissected out and a high ligation performed with a 2-0 silk suture and the sac excised and discarded.  The floor of the inguinal canal was reconstructed with Ethicon Ultrapro mesh cut to the appropriate dimensions.  It was secured to the pubic tubercle with a 2-0 Novafil suture and along the inguinal ligament with a running 2-0 Novafil suture.  Mesh was split to accommodate the cord structures.  The superior margin of the mesh was secured to the transversalis and internal oblique musculature with interrupted 2-0 Novafil sutures.  The tails of the mesh were overlapped lateral to the cord structures and secured to the inguinal ligament with interrupted 2-0 Novafil sutures to recreate the internal inguinal ring.  Cord structures were returned to the inguinal canal.  Local anesthetic was infiltrated throughout the field.  External oblique fascia was closed with interrupted 3-0 Vicryl sutures.  Subcutaneous tissues were closed with interrupted 3-0 Vicryl sutures.  Skin was anesthetized with local anesthetic, and the skin edges were re-approximated with a running 4-0 Monocryl suture.  Wound was washed and dried and steristrips were applied.  A dry gauze dressing was applied.  Instrument, sponge, and needle counts were correct prior to closure and at the conclusion of the case.  The patient tolerated the procedure well.  The patient was awakened from anesthesia and brought to the recovery room in stable condition.  Earnstine Regal, MD, [facility] Office: [phone]

## 2017-01-22 NOTE — Anesthesia Procedure Notes (Signed)
Procedure Name: LMA Insertion Date/Time: 01/22/2017 9:42 AM Performed by: Valda Favia Pre-anesthesia Checklist: Patient identified, Emergency Drugs available, Suction available, Patient being monitored and Timeout performed Patient Re-evaluated:Patient Re-evaluated prior to inductionOxygen Delivery Method: Circle system utilized Preoxygenation: Pre-oxygenation with 100% oxygen Intubation Type: IV induction LMA: LMA inserted LMA Size: 5.0 Number of attempts: 1 Placement Confirmation: positive ETCO2 and breath sounds checked- equal and bilateral Tube secured with: Tape Dental Injury: Teeth and Oropharynx as per pre-operative assessment

## 2017-01-22 NOTE — Anesthesia Procedure Notes (Signed)
Anesthesia Regional Block: TAP block   Pre-Anesthetic Checklist: ,, timeout performed, Correct Patient, Correct Site, Correct Laterality, Correct Procedure,, site marked, risks and benefits discussed, Surgical consent,  Pre-op evaluation,  At surgeon's request and post-op pain management  Laterality: Right  Prep: chloraprep       Needles:  Injection technique: Single-shot  Needle Type: Echogenic Stimulator Needle     Needle Length: 9cm  Needle Gauge: 21     Additional Needles:   Procedures: ultrasound guided,,,,,,,,  Narrative:  Start time: 01/22/2017 9:10 AM End time: 01/22/2017 9:20 AM Injection made incrementally with aspirations every 5 mL.  Performed by: Personally  Anesthesiologist: Adele Barthel P  Additional Notes: Functioning IV was confirmed and monitors were applied.  A 60mm 21ga Arrow echogenic stimulator needle was used. Sterile prep, hand hygiene and sterile gloves were used.  Negative aspiration and negative test dose prior to incremental administration of local anesthetic. The patient tolerated the procedure well.

## 2017-01-22 NOTE — Progress Notes (Signed)
Computer not syncing with monitor, unsure of the problem.  Pt. Has been stable, before , during & after Block. VS. Wnl.  O2, nasally at 2 liters. Pt. Had 50 mcg of Fentanyl. Tolerated well. Timeout was completed prior to block.

## 2017-01-22 NOTE — Interval H&P Note (Signed)
History and Physical Interval Note:  01/22/2017 9:11 AM  Zachary Hall  has presented today for surgery, with the diagnosis of right inguinal hernia, reducible  The various methods of treatment have been discussed with the patient and family. After consideration of risks, benefits and other options for treatment, the patient has consented to  Procedure(s): RIGHT INGUINAL HERNIA REPAIR WITH MESH (Right) INSERTION OF MESH (Right) as a surgical intervention .  The patient's history has been reviewed, patient examined, no change in status, stable for surgery.  I have reviewed the patient's chart and labs.  Questions were answered to the patient's satisfaction.    Earnstine Regal, MD, [facility] Office: [place]

## 2017-01-22 NOTE — Anesthesia Postprocedure Evaluation (Signed)
Anesthesia Post Note  Patient: Zachary Hall  Procedure(s) Performed: Procedure(s) (LRB): RIGHT INGUINAL HERNIA REPAIR WITH MESH (Right) INSERTION OF MESH (Right)     Patient location during evaluation: PACU Anesthesia Type: Regional and General Level of consciousness: awake and alert Pain management: pain level controlled Vital Signs Assessment: post-procedure vital signs reviewed and stable Respiratory status: spontaneous breathing, nonlabored ventilation, respiratory function stable and patient connected to nasal cannula oxygen Cardiovascular status: blood pressure returned to baseline and stable Postop Assessment: no signs of nausea or vomiting Anesthetic complications: no    Last Vitals:  Vitals:   01/22/17 1138 01/22/17 1155  BP: (!) 163/74 (!) 172/47  Pulse: (!) 56 60  Resp: 12 16  Temp: 36.3 C     Last Pain:  Vitals:   01/22/17 1108  TempSrc:   PainSc: Asleep                 Ryan P Ellender

## 2017-01-22 NOTE — Transfer of Care (Signed)
Immediate Anesthesia Transfer of Care Note  Patient: Zachary Hall  Procedure(s) Performed: Procedure(s): RIGHT INGUINAL HERNIA REPAIR WITH MESH (Right) INSERTION OF MESH (Right)  Patient Location: PACU  Anesthesia Type:GA combined with regional for post-op pain  Level of Consciousness: drowsy  Airway & Oxygen Therapy: Patient Spontanous Breathing and Patient connected to nasal cannula oxygen  Post-op Assessment: Report given to RN and Post -op Vital signs reviewed and stable  Post vital signs: Reviewed and stable  Last Vitals:  Vitals:   01/22/17 0751 01/22/17 1108  BP: (!) 178/69 (!) 165/75  Pulse: (!) 49 61  Resp: 20 12  Temp: 36.7 C 36.3 C    Last Pain:  Vitals:   01/22/17 1108  TempSrc:   PainSc: Asleep         Complications: No apparent anesthesia complications

## 2017-01-22 NOTE — Anesthesia Preprocedure Evaluation (Addendum)
Anesthesia Evaluation  Patient identified by MRN, date of birth, ID band Patient awake    Reviewed: Allergy & Precautions, H&P , NPO status , Patient's Chart, lab work & pertinent test results  History of Anesthesia Complications Negative for: history of anesthetic complications  Airway Mallampati: IV  TM Distance: >3 FB Neck ROM: full    Dental   Pulmonary former smoker,    Pulmonary exam normal        Cardiovascular hypertension, Pt. on medications + CAD, + Past MI and + CABG  Normal cardiovascular exam+ dysrhythmias Atrial Fibrillation   ECG: SB, rate 50. RBBB  Sees cardiologist ([name])  The nuclear stress test is normal.  No ischemia.  Ejection fraction was normal at 66%.   Neuro/Psych negative neurological ROS  negative psych ROS   GI/Hepatic negative GI ROS, Neg liver ROS,   Endo/Other  negative endocrine ROS  Renal/GU negative Renal ROS     Musculoskeletal   Abdominal   Peds negative pediatric ROS (+)  Hematology negative hematology ROS (+)   Anesthesia Other Findings Hyperlipidemia   Reproductive/Obstetrics                            Anesthesia Physical  Anesthesia Plan  ASA: III  Anesthesia Plan: General and Regional   Post-op Pain Management:  Regional for Post-op pain   Induction: Intravenous  PONV Risk Score and Plan: 2 and Ondansetron, Dexamethasone and Propofol  Airway Management Planned: LMA  Additional Equipment:   Intra-op Plan:   Post-operative Plan:   Informed Consent: I have reviewed the patients History and Physical, chart, labs and discussed the procedure including the risks, benefits and alternatives for the proposed anesthesia with the patient or authorized representative who has indicated his/her understanding and acceptance.     Plan Discussed with: CRNA and Surgeon  Anesthesia Plan Comments:         Anesthesia Quick Evaluation

## 2017-01-23 ENCOUNTER — Encounter (HOSPITAL_COMMUNITY): Payer: Self-pay | Admitting: Surgery

## 2017-02-19 ENCOUNTER — Other Ambulatory Visit: Payer: Self-pay

## 2017-02-19 DIAGNOSIS — E785 Hyperlipidemia, unspecified: Secondary | ICD-10-CM

## 2017-02-19 DIAGNOSIS — I2510 Atherosclerotic heart disease of native coronary artery without angina pectoris: Secondary | ICD-10-CM

## 2017-02-19 DIAGNOSIS — I10 Essential (primary) hypertension: Secondary | ICD-10-CM

## 2017-03-26 DIAGNOSIS — I2510 Atherosclerotic heart disease of native coronary artery without angina pectoris: Secondary | ICD-10-CM | POA: Diagnosis not present

## 2017-03-26 DIAGNOSIS — I10 Essential (primary) hypertension: Secondary | ICD-10-CM | POA: Diagnosis not present

## 2017-03-26 DIAGNOSIS — E785 Hyperlipidemia, unspecified: Secondary | ICD-10-CM | POA: Diagnosis not present

## 2017-03-26 LAB — BASIC METABOLIC PANEL
BUN/Creatinine Ratio: 12 (ref 10–24)
BUN: 13 mg/dL (ref 8–27)
CHLORIDE: 102 mmol/L (ref 96–106)
CO2: 23 mmol/L (ref 20–29)
CREATININE: 1.06 mg/dL (ref 0.76–1.27)
Calcium: 9.2 mg/dL (ref 8.6–10.2)
GFR, EST AFRICAN AMERICAN: 76 mL/min/{1.73_m2} (ref >59)
GFR, EST NON AFRICAN AMERICAN: 66 mL/min/{1.73_m2} (ref >59)
Glucose: 92 mg/dL (ref 65–99)
Potassium: 4.3 mmol/L (ref 3.5–5.2)
Sodium: 141 mmol/L (ref 134–144)

## 2017-03-26 LAB — LIPID PANEL
CHOL/HDL RATIO: 2.5 ratio (ref 0.0–5.0)
Cholesterol, Total: 128 mg/dL (ref 100–199)
HDL: 52 mg/dL (ref >39)
LDL CALC: 53 mg/dL (ref 0–99)
Triglycerides: 114 mg/dL (ref 0–149)
VLDL Cholesterol Cal: 23 mg/dL (ref 5–40)

## 2017-03-26 LAB — HEPATIC FUNCTION PANEL
ALBUMIN: 4.5 g/dL (ref 3.5–4.7)
ALK PHOS: 64 IU/L (ref 39–117)
ALT: 15 IU/L (ref 0–44)
AST: 16 IU/L (ref 0–40)
Bilirubin Total: 0.7 mg/dL (ref 0.0–1.2)
Bilirubin, Direct: 0.2 mg/dL (ref 0.00–0.40)
Total Protein: 6.6 g/dL (ref 6.0–8.5)

## 2017-03-30 ENCOUNTER — Telehealth: Payer: Self-pay | Admitting: Cardiology

## 2017-03-30 NOTE — Telephone Encounter (Signed)
SPOKE WIFE . RESULT GIVEN,. VERBALIZED UNDERSTANDING.  HAS UPCOMING APPOINTMENT

## 2017-03-30 NOTE — Telephone Encounter (Signed)
Zachary Hall is returning a call about Zachary Hall Test Results .[name] Please call

## 2017-04-20 NOTE — Progress Notes (Signed)
Cardiology Office Note   Date:  04/26/2017   ID:  Zachary Hall, DOB [birth year], MRN [id number]  PCP:  None Cardiologist: Cerinity Zynda [name] MD  Chief Complaint  Patient presents with  . Follow-up    has a funny feeling in front of head, no heart concerns   . Coronary Artery Disease      History of Present Illness: Zachary Hall is a 82 y.o. male is seen for follow up CAD.  He has a history of known ischemic heart disease. He underwent coronary artery bypass graft surgery on 02/07/10 by Dr. Cyndia Bent. Last Myoview in February 2015 was normal with EF 66%.   He does have some problems with his memory.He was seen by neurology on 09/14/15 who checked some blood work and ordered a MRI, which showed chronic small vessel ischemic changes but no acute findings.  Since his last visit here he did undergo right inguinal hernia repair in June. No complications.  On follow up today he is doing well. He stays active with his yard work and working on an old car. He denies any chest pain, palpitations, dyspnea or dizziness. Overall feels well.  Past Medical History:  Diagnosis Date  . Bradycardia   . Coronary artery disease   . Gout   . History of atrial fibrillation    Previously on amiodarone  . Hx of CABG   . Hypercholesterolemia   . Hypertension   . Myocardial infarction ([place])   . Nocturia     Past Surgical History:  Procedure Laterality Date  . CARDIAC CATHETERIZATION  01/04/2010  . CORONARY ARTERY BYPASS GRAFT  02/07/2010   LIMA to LAD, SVG to 2nd DX, SVG to OM  . EYE SURGERY Bilateral    Cataract Extraction  . HEMORRHOID SURGERY     x 2  . HEMORRHOID SURGERY N/A 11/13/2012   Procedure: Dawn Hemorrhoidectomy;  Surgeon: Merrie Roof, MD;  Location: [place];  Service: General;  Laterality: N/A;  . INGUINAL HERNIA REPAIR Right 01/22/2017   Procedure: RIGHT INGUINAL HERNIA REPAIR WITH MESH;  Surgeon: Armandina Gemma, MD;  Location: [place];  Service: General;  Laterality: Right;  .  INSERTION OF MESH Right 01/22/2017   Procedure: INSERTION OF MESH;  Surgeon: Armandina Gemma, MD;  Location: [place];  Service: General;  Laterality: Right;  . NASAL POLYP EXCISION    . OTHER SURGICAL HISTORY  06/05/2002   Penile Implant     Current Outpatient Prescriptions  Medication Sig Dispense Refill  . amLODipine (NORVASC) 10 MG tablet Take 1 tablet (10 mg total) by mouth daily. 90 tablet 3  . aspirin 81 MG tablet Take 81 mg by mouth every other day. At night    . atorvastatin (LIPITOR) 10 MG tablet Take 1 tablet (10 mg total) by mouth daily. (Patient taking differently: Take 10 mg by mouth every evening. ) 90 tablet 3  . cetirizine (ZYRTEC) 10 MG tablet Take 10 mg by mouth daily.    [name] CHERRY PO Take 1 capsule by mouth 2 (two) times daily. ** Black Cherry **     . clobetasol ointment (TEMOVATE) 5.46 % Apply 1 application topically daily as needed. Skin irritation    . fluocinonide cream (LIDEX) 5.03 % Apply 1 application topically as needed (dry skin).     . fluorouracil (EFUDEX) 5 % cream Apply 1 application topically daily. To top of scalp    . Fluticasone-Salmeterol (ADVAIR DISKUS) 250-50 MCG/DOSE AEPB Inhale 1 puff into  the lungs every 12 (twelve) hours as needed (as needed for wheezing). Reported on 09/14/2015    . hydrochlorothiazide (MICROZIDE) 12.5 MG capsule TAKE 1 CAPSULE (12.5 MG TOTAL) BY MOUTH DAILY. 90 capsule 3  . HYDROcodone-acetaminophen (NORCO/VICODIN) 5-325 MG tablet Take 1-2 tablets by mouth every 4 (four) hours as needed for moderate pain. 30 tablet 0  . losartan (COZAAR) 50 MG tablet Take 1 tablet (50 mg total) by mouth daily. (Patient taking differently: Take 50 mg by mouth every evening. ) 90 tablet 3  . Multiple Vitamin (MULTIVITAMIN IRON-FREE) TABS Take 1 tablet by mouth daily.    . nitroGLYCERIN (NITROSTAT) 0.4 MG SL tablet PLACE 1 TABLET (0.4 MG TOTAL) UNDER THE TONGUE EVERY 5 (FIVE) MINUTES AS NEEDED. FOR CHEST PAIN 25 tablet 11  . Omega-3 Fatty Acids (OMEGA-3 FISH  OIL) 300 MG CAPS Take 300 mg by mouth 2 (two) times daily.    . polyethylene glycol (MIRALAX / GLYCOLAX) packet Take 17 g by mouth daily.     No current facility-administered medications for this visit.     Allergies:   Lisinopril    Social History:  The patient  reports that he quit smoking about 56 years ago. His smoking use included Cigarettes. He has quit using smokeless tobacco. He reports that he drinks about 2.4 oz of alcohol per week . He reports that he does not use drugs.   Family History:  The patient's family history includes Alcohol abuse in his father; Cancer in his mother; Colon cancer in his mother.    ROS:  Please see the history of present illness.   Otherwise, review of systems are positive for none.   All other systems are reviewed and negative.    PHYSICAL EXAM: VS:  BP (!) 148/71   Pulse (!) 48   Ht 5\' 11"  (1.803 m)   Wt 163 lb (73.9 kg)   BMI 22.73 kg/m  , BMI Body mass index is 22.73 kg/m. GENERAL:  Well appearing WM in NAD HEENT:  PERRL, EOMI, sclera are clear. Oropharynx is clear. NECK:  No jugular venous distention, carotid upstroke brisk and symmetric, no bruits, no thyromegaly or adenopathy LUNGS:  Clear to auscultation bilaterally CHEST:  Unremarkable HEART:  RRR,  PMI not displaced or sustained,S1 and S2 within normal limits, no S3, no S4: no clicks, no rubs, no murmurs ABD:  Soft, nontender. BS +, no masses or bruits. No hepatomegaly, no splenomegaly EXT:  2 + pulses throughout, no edema, no cyanosis no clubbing SKIN:  Warm and dry.  No rashes NEURO:  Alert and oriented x 3. Cranial nerves II through XII intact. PSYCH:  Cognitively intact     EKG:  EKG  Not ordered today.  Recent Labs: 01/22/2017: Hemoglobin 13.2; Platelets 173 03/26/2017: ALT 15; BUN 13; Creatinine, Ser 1.06; Potassium 4.3; Sodium 141    Lipid Panel    Component Value Date/Time   CHOL 128 03/26/2017 1034   TRIG 114 03/26/2017 1034   HDL 52 03/26/2017 1034   CHOLHDL  2.5 03/26/2017 1034   CHOLHDL 2.7 10/16/2016 1443   VLDL 32 (H) 10/16/2016 1443   LDLCALC 53 03/26/2017 1034      Wt Readings from Last 3 Encounters:  04/26/17 163 lb (73.9 kg)  01/20/17 188 lb 15 oz (85.7 kg)  10/16/16 193 lb (87.5 kg)     ASSESSMENT AND PLAN:  1. Ischemic heart disease status post CABG. Patient is asymptomatic. Last Myoview in Feb. 2015 was normal. Continue current  medical therapy.  2. Bifascicular block- chronic- asymptomatic. Pulse was initially low today but on my exam pulse was 60. Will monitor.  3. Hypercholesterolemia. Excellent control on statin. 4. Remote history of paroxysmal atrial fibrillation- no recurrence. 5. Benign hypertensive heart disease without heart failure. BP is under fair control. Continue current meds. 6. Memory disorder  Current medicines are reviewed at length with the patient today.  The patient does not have concerns regarding medicines.  The following changes have been made:  no change  Labs/ tests ordered today include:   No orders of the defined types were placed in this encounter.   Disposition: Continue current medication.  Follow up 6 months.  Signed, Janika Jedlicka [name] MD, [facility]   04/26/2017 12:29 PM    Alex

## 2017-04-26 ENCOUNTER — Ambulatory Visit (INDEPENDENT_AMBULATORY_CARE_PROVIDER_SITE_OTHER): Payer: BLUE CROSS/BLUE SHIELD | Admitting: Cardiology

## 2017-04-26 ENCOUNTER — Encounter: Payer: Self-pay | Admitting: Cardiology

## 2017-04-26 VITALS — BP 148/71 | HR 48 | Ht 71.0 in | Wt 163.0 lb

## 2017-04-26 DIAGNOSIS — Z951 Presence of aortocoronary bypass graft: Secondary | ICD-10-CM

## 2017-04-26 DIAGNOSIS — I452 Bifascicular block: Secondary | ICD-10-CM

## 2017-04-26 DIAGNOSIS — E7800 Pure hypercholesterolemia, unspecified: Secondary | ICD-10-CM

## 2017-04-26 DIAGNOSIS — I2510 Atherosclerotic heart disease of native coronary artery without angina pectoris: Secondary | ICD-10-CM

## 2017-04-26 DIAGNOSIS — I10 Essential (primary) hypertension: Secondary | ICD-10-CM | POA: Diagnosis not present

## 2017-04-26 NOTE — Patient Instructions (Addendum)
Continue your current therapy  I will see you in 6 months.   

## 2017-10-01 ENCOUNTER — Ambulatory Visit: Payer: BLUE CROSS/BLUE SHIELD | Admitting: Neurology

## 2017-10-19 ENCOUNTER — Other Ambulatory Visit: Payer: Self-pay | Admitting: Cardiology

## 2017-10-19 NOTE — Telephone Encounter (Signed)
Rx has been sent to the pharmacy electronically. ° °

## 2017-10-23 ENCOUNTER — Encounter: Payer: Self-pay | Admitting: Neurology

## 2017-10-23 ENCOUNTER — Ambulatory Visit (INDEPENDENT_AMBULATORY_CARE_PROVIDER_SITE_OTHER): Payer: BLUE CROSS/BLUE SHIELD | Admitting: Neurology

## 2017-10-23 ENCOUNTER — Other Ambulatory Visit: Payer: Self-pay

## 2017-10-23 VITALS — BP 122/60 | HR 53 | Ht 71.0 in | Wt 191.0 lb

## 2017-10-23 DIAGNOSIS — F0390 Unspecified dementia without behavioral disturbance: Secondary | ICD-10-CM | POA: Diagnosis not present

## 2017-10-23 DIAGNOSIS — F03A0 Unspecified dementia, mild, without behavioral disturbance, psychotic disturbance, mood disturbance, and anxiety: Secondary | ICD-10-CM

## 2017-10-23 MED ORDER — DONEPEZIL HCL 10 MG PO TABS: ORAL_TABLET | ORAL | 11 refills | Status: DC
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
## 2017-10-23 NOTE — Patient Instructions (Signed)
1. Start Donepezil 10mg : take 1/2 tablet daily for 2 weeks, then increase to 1 tablet daily 2. Go to [facility] for dementia driving evaluation 3. Follow-up in 6 months, call for any changes  FALL PRECAUTIONS: Be cautious when walking. Scan the area for obstacles that may increase the risk of trips and falls. When getting up in the mornings, sit up at the edge of the bed for a few minutes before getting out of bed. Consider elevating the bed at the head end to avoid drop of blood pressure when getting up. Walk always in a well-lit room (use night lights in the walls). Avoid area rugs or power cords from appliances in the middle of the walkways. Use a walker or a cane if necessary and consider physical therapy for balance exercise. Get your eyesight checked regularly.  FINANCIAL OVERSIGHT: Supervision, especially oversight when making financial decisions or transactions is also recommended.  HOME SAFETY: Consider the safety of the kitchen when operating appliances like stoves, microwave oven, and blender. Consider having supervision and share cooking responsibilities until no longer able to participate in those. Accidents with firearms and other hazards in the house should be identified and addressed as well.  DRIVING: Regarding driving, in patients with progressive memory problems, driving will be impaired. We advise to have someone else do the driving if trouble finding directions or if minor accidents are reported. Independent driving assessment is available to determine safety of driving.  ABILITY TO BE LEFT ALONE: If patient is unable to contact 911 operator, consider using LifeLine, or when the need is there, arrange for someone to stay with patients. Smoking is a fire hazard, consider supervision or cessation. Risk of wandering should be assessed by caregiver and if detected at any point, supervision and safe proof recommendations should be instituted.  MEDICATION SUPERVISION: Inability to  self-administer medication needs to be constantly addressed. Implement a mechanism to ensure safe administration of the medications.  RECOMMENDATIONS FOR ALL PATIENTS WITH MEMORY PROBLEMS: 1. Continue to exercise (Recommend 30 minutes of walking everyday, or 3 hours every week) 2. Increase social interactions - continue going to [place] and enjoy social gatherings with friends and family 3. Eat healthy, avoid fried foods and eat more fruits and vegetables 4. Maintain adequate blood pressure, blood sugar, and blood cholesterol level. Reducing the risk of stroke and cardiovascular disease also helps promoting better memory. 5. Avoid stressful situations. Live a simple life and avoid aggravations. Organize your time and prepare for the next day in anticipation. 6. Sleep well, avoid any interruptions of sleep and avoid any distractions in the bedroom that may interfere with adequate sleep quality 7. Avoid sugar, avoid sweets as there is a strong link between excessive sugar intake, diabetes, and cognitive impairment The Mediterranean diet has been shown to help patients reduce the risk of progressive memory disorders and reduces cardiovascular risk. This includes eating fish, eat fruits and green leafy vegetables, nuts like almonds and hazelnuts, walnuts, and also use olive oil. Avoid fast foods and fried foods as much as possible. Avoid sweets and sugar as sugar use has been linked to worsening of memory function.  There is always a concern of gradual progression of memory problems. If this is the case, then we may need to adjust level of care according to patient needs. Support, both to the patient and caregiver, should then be put into place.

## 2017-10-24 NOTE — Progress Notes (Signed)
Cardiology Office Note   Date:  10/25/2017   ID:  Zachary Hall, DOB 12-23-1934, MRN [id number]  PCP:  None Cardiologist: Jaidon Ellery [name] MD  Chief Complaint  Patient presents with  . Follow-up  . Coronary Artery Disease      History of Present Illness: Zachary Hall is a 83 y.o. male is seen for follow up CAD.  He has a history of known ischemic heart disease. He underwent coronary artery bypass graft surgery on 02/07/10 by Dr. Cyndia Bent. Last Myoview in February 2015 was normal with EF 66%.   He does have some problems with his memory.He is followed  by neurology. MRI showed chronic small vessel ischemic changes but no acute findings. Felt to have mild dementia.   On my exam he is doing very well. He denies any chest pain, pressure, dyspnea, dizziness, fatigue. He is very active doing yard work.    Past Medical History:  Diagnosis Date  . Bradycardia   . Coronary artery disease   . Gout   . History of atrial fibrillation    Previously on amiodarone  . Hx of CABG   . Hypercholesterolemia   . Hypertension   . Myocardial infarction ([place])   . Nocturia     Past Surgical History:  Procedure Laterality Date  . CARDIAC CATHETERIZATION  01/04/2010  . CORONARY ARTERY BYPASS GRAFT  02/07/2010   LIMA to LAD, SVG to 2nd DX, SVG to OM  . EYE SURGERY Bilateral    Cataract Extraction  . HEMORRHOID SURGERY     x 2  . HEMORRHOID SURGERY N/A 11/13/2012   Procedure: [place] Hemorrhoidectomy;  Surgeon: Merrie Roof, MD;  Location: [place];  Service: General;  Laterality: N/A;  . INGUINAL HERNIA REPAIR Right 01/22/2017   Procedure: RIGHT INGUINAL HERNIA REPAIR WITH MESH;  Surgeon: Armandina Gemma, MD;  Location: [place];  Service: General;  Laterality: Right;  . INSERTION OF MESH Right 01/22/2017   Procedure: INSERTION OF MESH;  Surgeon: Armandina Gemma, MD;  Location: [place];  Service: General;  Laterality: Right;  . NASAL POLYP EXCISION    . OTHER SURGICAL HISTORY  06/05/2002   Penile Implant      Current Outpatient Medications  Medication Sig Dispense Refill  . amLODipine (NORVASC) 10 MG tablet Take 1 tablet (10 mg total) by mouth daily. 90 tablet 3  . aspirin 81 MG tablet Take 81 mg by mouth every other day. At night    . atorvastatin (LIPITOR) 10 MG tablet Take 1 tablet (10 mg total) by mouth daily. (Patient taking differently: Take 10 mg by mouth every evening. ) 90 tablet 3  . cetirizine (ZYRTEC) 10 MG tablet Take 10 mg by mouth daily.    [name] CHERRY PO Take 1 capsule by mouth 2 (two) times daily. ** Black Cherry **     . clobetasol ointment (TEMOVATE) 2.13 % Apply 1 application topically daily as needed. Skin irritation    . donepezil (ARICEPT) 10 MG tablet Take 1 tablet daily 30 tablet 11  . fluocinonide cream (LIDEX) 0.86 % Apply 1 application topically as needed (dry skin).     . fluorouracil (EFUDEX) 5 % cream Apply 1 application topically daily. To top of scalp    . Fluticasone-Salmeterol (ADVAIR DISKUS) 250-50 MCG/DOSE AEPB Inhale 1 puff into the lungs every 12 (twelve) hours as needed (as needed for wheezing). Reported on 09/14/2015    . hydrochlorothiazide (MICROZIDE) 12.5 MG capsule TAKE 1 CAPSULE (12.5 MG  TOTAL) BY MOUTH DAILY. 90 capsule 3  . losartan (COZAAR) 50 MG tablet Take 1 tablet (50 mg total) by mouth daily. (Patient taking differently: Take 50 mg by mouth every evening. ) 90 tablet 3  . Multiple Vitamin (MULTIVITAMIN IRON-FREE) TABS Take 1 tablet by mouth daily.    . nitroGLYCERIN (NITROSTAT) 0.4 MG SL tablet PLACE 1 TABLET (0.4 MG TOTAL) UNDER THE TONGUE EVERY 5 (FIVE) MINUTES AS NEEDED. FOR CHEST PAIN 25 tablet 1  . Omega-3 Fatty Acids (OMEGA-3 FISH OIL) 300 MG CAPS Take 300 mg by mouth 2 (two) times daily.     No current facility-administered medications for this visit.     Allergies:   Lisinopril    Social History:  The patient  reports that he quit smoking about 56 years ago. His smoking use included cigarettes. He has quit using smokeless tobacco. He  reports that he drinks about 2.4 oz of alcohol per week. He reports that he does not use drugs.   Family History:  The patient's family history includes Alcohol abuse in his father; Cancer in his mother; Colon cancer in his mother.    ROS:  Please see the history of present illness.   Otherwise, review of systems are positive for none.   All other systems are reviewed and negative.    PHYSICAL EXAM: VS:  BP 127/70   Pulse 61   Ht 5\' 11"  (1.803 m)   Wt 192 lb (87.1 kg)   BMI 26.78 kg/m  , BMI Body mass index is 26.78 kg/m. GENERAL:  Well appearing HEENT:  PERRL, EOMI, sclera are clear. Oropharynx is clear. NECK:  No jugular venous distention, carotid upstroke brisk and symmetric, no bruits, no thyromegaly or adenopathy LUNGS:  Clear to auscultation bilaterally CHEST:  Unremarkable HEART:  RRR,  PMI not displaced or sustained,S1 and S2 within normal limits, no S3, no S4: no clicks, no rubs, no murmurs ABD:  Soft, nontender. BS +, no masses or bruits. No hepatomegaly, no splenomegaly EXT:  2 + pulses throughout, no edema, no cyanosis no clubbing SKIN:  Warm and dry.  No rashes NEURO:  Alert and oriented x 3. Cranial nerves II through XII intact. PSYCH:  Cognitively intact  EKG:  EKG  Is  ordered today. NSR rate 52. LAFB, RBBB. I have personally reviewed and interpreted this study.   Recent Labs: 01/22/2017: Hemoglobin 13.2; Platelets 173 03/26/2017: ALT 15; BUN 13; Creatinine, Ser 1.06; Potassium 4.3; Sodium 141    Lipid Panel    Component Value Date/Time   CHOL 128 03/26/2017 1034   TRIG 114 03/26/2017 1034   HDL 52 03/26/2017 1034   CHOLHDL 2.5 03/26/2017 1034   CHOLHDL 2.7 10/16/2016 1443   VLDL 32 (H) 10/16/2016 1443   LDLCALC 53 03/26/2017 1034      Wt Readings from Last 3 Encounters:  10/25/17 192 lb (87.1 kg)  10/23/17 191 lb (86.6 kg)  04/26/17 163 lb (73.9 kg)     ASSESSMENT AND PLAN:  1. Ischemic heart disease status post CABG 2011. Patient is  asymptomatic. Last Myoview in Feb. 2015 was normal. Continue current medical therapy.  2. Bifascicular block- chronic- asymptomatic. Pulse 52. Will monitor.  3. Hypercholesterolemia. Excellent control on statin. Repeat fasting lab next visit.  4. Remote history of paroxysmal atrial fibrillation- no recurrence. 5. Benign hypertensive heart disease without heart failure. BP is under good control. Continue current meds. 6. Memory disorder followed by Neurology.   Current medicines are reviewed at length  with the patient today.  The patient does not have concerns regarding medicines.  The following changes have been made:  no change  Labs/ tests ordered today include:   No orders of the defined types were placed in this encounter.   Disposition: Continue current medication.  Follow up 6 months with lab work.  Signed, Kemuel Buchmann [name] MD, [facility]   10/25/2017 2:42 PM    [facility]

## 2017-10-25 ENCOUNTER — Encounter: Payer: Self-pay | Admitting: Cardiology

## 2017-10-25 ENCOUNTER — Ambulatory Visit (INDEPENDENT_AMBULATORY_CARE_PROVIDER_SITE_OTHER): Payer: BLUE CROSS/BLUE SHIELD | Admitting: Cardiology

## 2017-10-25 VITALS — BP 127/70 | HR 61 | Ht 71.0 in | Wt 192.0 lb

## 2017-10-25 DIAGNOSIS — E7800 Pure hypercholesterolemia, unspecified: Secondary | ICD-10-CM | POA: Diagnosis not present

## 2017-10-25 DIAGNOSIS — Z951 Presence of aortocoronary bypass graft: Secondary | ICD-10-CM | POA: Diagnosis not present

## 2017-10-25 DIAGNOSIS — I452 Bifascicular block: Secondary | ICD-10-CM

## 2017-10-25 DIAGNOSIS — I2510 Atherosclerotic heart disease of native coronary artery without angina pectoris: Secondary | ICD-10-CM | POA: Diagnosis not present

## 2017-10-25 DIAGNOSIS — I10 Essential (primary) hypertension: Secondary | ICD-10-CM | POA: Diagnosis not present

## 2017-10-25 NOTE — Patient Instructions (Signed)
Continue your current therapy  I will see you in 6 months with lab work   

## 2017-11-02 ENCOUNTER — Other Ambulatory Visit: Payer: Self-pay | Admitting: Cardiology

## 2017-11-02 NOTE — Telephone Encounter (Signed)
Rx has been sent to the pharmacy electronically. ° °

## 2017-11-07 ENCOUNTER — Telehealth: Payer: Self-pay | Admitting: Neurology

## 2017-11-07 NOTE — Telephone Encounter (Signed)
Stop medication. Call our office in 2 weeks for an update, we will plan to start a different medication once diarrhea resolves. Thanks

## 2017-11-07 NOTE — Telephone Encounter (Signed)
1.     Name of medication: Aricept  2.     How are you currently taking this medication (dosage and times per day)? 1/2 pill a day  3.     Are you having a reaction (difficulty breathing--STAT)?   4.     What is your medication issue? Diarrhea

## 2017-11-07 NOTE — Telephone Encounter (Signed)
Called and spoke to Zachary Hall. Pt was "gassy" all last week, starting 2 days ago, he has been experiencing diarrhea. Today it has gotten so bad he has not made it to the bathroom on a few occasions. Should he continue medication?

## 2017-11-07 NOTE — Telephone Encounter (Signed)
Called and LMOVM for Zachary Hall to d/c Aricept and call in 2 weeks.

## 2017-11-09 DIAGNOSIS — L820 Inflamed seborrheic keratosis: Secondary | ICD-10-CM | POA: Diagnosis not present

## 2017-11-09 DIAGNOSIS — L570 Actinic keratosis: Secondary | ICD-10-CM | POA: Diagnosis not present

## 2017-11-09 DIAGNOSIS — D2271 Melanocytic nevi of right lower limb, including hip: Secondary | ICD-10-CM | POA: Diagnosis not present

## 2017-11-09 DIAGNOSIS — D225 Melanocytic nevi of trunk: Secondary | ICD-10-CM | POA: Diagnosis not present

## 2017-11-09 DIAGNOSIS — L812 Freckles: Secondary | ICD-10-CM | POA: Diagnosis not present

## 2017-11-09 DIAGNOSIS — L821 Other seborrheic keratosis: Secondary | ICD-10-CM | POA: Diagnosis not present

## 2017-12-05 ENCOUNTER — Other Ambulatory Visit: Payer: Self-pay | Admitting: Cardiology

## 2017-12-05 NOTE — Telephone Encounter (Signed)
Rx(s) sent to pharmacy electronically.  

## 2017-12-09 ENCOUNTER — Other Ambulatory Visit: Payer: Self-pay | Admitting: Cardiology

## 2017-12-25 ENCOUNTER — Ambulatory Visit: Payer: Medicare Other | Admitting: Neurology

## 2017-12-25 ENCOUNTER — Encounter

## 2018-01-03 DIAGNOSIS — D3131 Benign neoplasm of right choroid: Secondary | ICD-10-CM | POA: Diagnosis not present

## 2018-01-03 DIAGNOSIS — H35371 Puckering of macula, right eye: Secondary | ICD-10-CM | POA: Diagnosis not present

## 2018-01-03 DIAGNOSIS — Z961 Presence of intraocular lens: Secondary | ICD-10-CM | POA: Diagnosis not present

## 2018-01-11 ENCOUNTER — Other Ambulatory Visit: Payer: Self-pay | Admitting: Cardiology

## 2018-03-10 ENCOUNTER — Other Ambulatory Visit: Payer: Self-pay | Admitting: Cardiology

## 2018-03-25 DIAGNOSIS — K635 Polyp of colon: Secondary | ICD-10-CM | POA: Diagnosis not present

## 2018-03-25 DIAGNOSIS — D126 Benign neoplasm of colon, unspecified: Secondary | ICD-10-CM | POA: Diagnosis not present

## 2018-03-25 DIAGNOSIS — Z86010 Personal history of colonic polyps: Secondary | ICD-10-CM | POA: Diagnosis not present

## 2018-03-29 NOTE — Progress Notes (Signed)
Cardiology Office Note   Date:  04/02/2018   ID:  Zachary Hall, DOB 11-Feb-1935, MRN [id number]  PCP:  None Cardiologist: Peter [name] MD  Chief Complaint  Patient presents with  . Coronary Artery Disease      History of Present Illness: Zachary Hall is a 83 y.o. male is seen for follow up CAD.  He has a history of known ischemic heart disease. He underwent coronary artery bypass graft surgery on 02/07/10 by Dr. Cyndia Bent. Last Myoview in February 2015 was normal with EF 66%.   He does have some problems with his memory.He is followed  by neurology. MRI showed chronic small vessel ischemic changes but no acute findings. Felt to have mild dementia.  On follow up today he reports he is doing well. Denies any chest pain, dyspnea, palpitations. Does some yard work. No real exercise routine.   Past Medical History:  Diagnosis Date  . Bradycardia   . Coronary artery disease   . Gout   . History of atrial fibrillation    Previously on amiodarone  . Hx of CABG   . Hypercholesterolemia   . Hypertension   . Myocardial infarction ([place])   . Nocturia     Past Surgical History:  Procedure Laterality Date  . CARDIAC CATHETERIZATION  01/04/2010  . CORONARY ARTERY BYPASS GRAFT  02/07/2010   LIMA to LAD, SVG to 2nd DX, SVG to OM  . EYE SURGERY Bilateral    Cataract Extraction  . HEMORRHOID SURGERY     x 2  . HEMORRHOID SURGERY N/A 11/13/2012   Procedure: [place] Hemorrhoidectomy;  Surgeon: Merrie Roof, MD;  Location: [place];  Service: General;  Laterality: N/A;  . INGUINAL HERNIA REPAIR Right 01/22/2017   Procedure: RIGHT INGUINAL HERNIA REPAIR WITH MESH;  Surgeon: Armandina Gemma, MD;  Location: [place];  Service: General;  Laterality: Right;  . INSERTION OF MESH Right 01/22/2017   Procedure: INSERTION OF MESH;  Surgeon: Armandina Gemma, MD;  Location: [place];  Service: General;  Laterality: Right;  . NASAL POLYP EXCISION    . OTHER SURGICAL HISTORY  06/05/2002   Penile Implant      Current Outpatient Medications  Medication Sig Dispense Refill  . amLODipine (NORVASC) 10 MG tablet Take 1 tablet (10 mg total) by mouth daily. 90 tablet 3  . aspirin 81 MG tablet Take 81 mg by mouth every other day. At night    . atorvastatin (LIPITOR) 10 MG tablet Take 1 tablet (10 mg total) by mouth daily at 6 PM. 90 tablet 3  . cetirizine (ZYRTEC) 10 MG tablet Take 10 mg by mouth daily.    [name] CHERRY PO Take 1 capsule by mouth 2 (two) times daily. ** Black Cherry **     . clobetasol ointment (TEMOVATE) 0.81 % Apply 1 application topically daily as needed. Skin irritation    . donepezil (ARICEPT) 10 MG tablet Take 1 tablet daily 30 tablet 11  . fluocinonide cream (LIDEX) 4.48 % Apply 1 application topically as needed (dry skin).     . fluorouracil (EFUDEX) 5 % cream Apply 1 application topically daily. To top of scalp    . Fluticasone-Salmeterol (ADVAIR DISKUS) 250-50 MCG/DOSE AEPB Inhale 1 puff into the lungs every 12 (twelve) hours as needed (as needed for wheezing). Reported on 09/14/2015    . hydrochlorothiazide (MICROZIDE) 12.5 MG capsule Take 1 capsule (12.5 mg total) by mouth daily. 90 capsule 3  . losartan (COZAAR) 50 MG  tablet Take 1 tablet (50 mg total) by mouth daily. 90 tablet 3  . Multiple Vitamin (MULTIVITAMIN IRON-FREE) TABS Take 1 tablet by mouth daily.    . nitroGLYCERIN (NITROSTAT) 0.4 MG SL tablet Take sublingual as needed for chest pain 25 tablet 1  . Omega-3 Fatty Acids (OMEGA-3 FISH OIL) 300 MG CAPS Take 300 mg by mouth 2 (two) times daily.     No current facility-administered medications for this visit.     Allergies:   Lisinopril    Social History:  The patient  reports that he quit smoking about 57 years ago. His smoking use included cigarettes. He has quit using smokeless tobacco. He reports that he drinks about 4.0 standard drinks of alcohol per week. He reports that he does not use drugs.   Family History:  The patient's family history includes Alcohol  abuse in his father; Cancer in his mother; Colon cancer in his mother.    ROS:  Please see the history of present illness.   Otherwise, review of systems are positive for none.   All other systems are reviewed and negative.    PHYSICAL EXAM: VS:  BP 120/70 (BP Location: Right Arm, Patient Position: Sitting, Cuff Size: Normal)   Pulse 78   Ht 5\' 11"  (1.803 m)   Wt 187 lb (84.8 kg)   BMI 26.08 kg/m  , BMI Body mass index is 26.08 kg/m. GENERAL:  Well appearing elderly WM in NAD HEENT:  PERRL, EOMI, sclera are clear. Oropharynx is clear. NECK:  No jugular venous distention, carotid upstroke brisk and symmetric, no bruits, no thyromegaly or adenopathy LUNGS:  Clear to auscultation bilaterally CHEST:  Unremarkable HEART:  RRR,  PMI not displaced or sustained,S1 and S2 within normal limits, no S3, no S4: no clicks, no rubs, no murmurs ABD:  Soft, nontender. BS +, no masses or bruits. No hepatomegaly, no splenomegaly EXT:  2 + pulses throughout, no edema, no cyanosis no clubbing SKIN:  Warm and dry.  No rashes NEURO:  Alert and oriented x 3. Cranial nerves II through XII intact. PSYCH:  Cognitively intact  EKG:  EKG  Is  Not ordered today.   Recent Labs: No results found for requested labs within last 8760 hours.    Lipid Panel    Component Value Date/Time   CHOL 128 03/26/2017 1034   TRIG 114 03/26/2017 1034   HDL 52 03/26/2017 1034   CHOLHDL 2.5 03/26/2017 1034   CHOLHDL 2.7 10/16/2016 1443   VLDL 32 (H) 10/16/2016 1443   LDLCALC 53 03/26/2017 1034      Wt Readings from Last 3 Encounters:  04/02/18 187 lb (84.8 kg)  10/25/17 192 lb (87.1 kg)  10/23/17 191 lb (86.6 kg)     ASSESSMENT AND PLAN:  1. Ischemic heart disease status post CABG 2011. Patient is asymptomatic. Last Myoview in Feb. 2015 was normal. Continue current medical therapy.  2. Bifascicular block- chronic- asymptomatic. Will monitor.  3. Hypercholesterolemia. Excellent control on statin. Repeat  fasting lab today 4. Remote history of paroxysmal atrial fibrillation- no recurrence. 5. Benign hypertensive heart disease without heart failure. BP is under good control. Continue current meds. 6. Memory disorder followed by Neurology. On Aricept.  Current medicines are reviewed at length with the patient today.  The patient does not have concerns regarding medicines.  The following changes have been made:  no change  Labs/ tests ordered today include:   No orders of the defined types were placed in this encounter.  Disposition: Continue current medication.  Follow up 6 months with lab work.  Signed, Peter [name] MD, [facility]   04/02/2018 2:39 PM    [place]

## 2018-04-02 ENCOUNTER — Ambulatory Visit (INDEPENDENT_AMBULATORY_CARE_PROVIDER_SITE_OTHER): Payer: BLUE CROSS/BLUE SHIELD | Admitting: Cardiology

## 2018-04-02 ENCOUNTER — Encounter: Payer: Self-pay | Admitting: Cardiology

## 2018-04-02 VITALS — BP 120/70 | HR 78 | Ht 71.0 in | Wt 187.0 lb

## 2018-04-02 DIAGNOSIS — I2510 Atherosclerotic heart disease of native coronary artery without angina pectoris: Secondary | ICD-10-CM | POA: Diagnosis not present

## 2018-04-02 DIAGNOSIS — Z951 Presence of aortocoronary bypass graft: Secondary | ICD-10-CM | POA: Diagnosis not present

## 2018-04-02 DIAGNOSIS — E7800 Pure hypercholesterolemia, unspecified: Secondary | ICD-10-CM | POA: Diagnosis not present

## 2018-04-02 DIAGNOSIS — I10 Essential (primary) hypertension: Secondary | ICD-10-CM | POA: Diagnosis not present

## 2018-04-02 DIAGNOSIS — I452 Bifascicular block: Secondary | ICD-10-CM | POA: Diagnosis not present

## 2018-04-02 LAB — BASIC METABOLIC PANEL
BUN/Creatinine Ratio: 12 (ref 10–24)
BUN: 14 mg/dL (ref 8–27)
CALCIUM: 9 mg/dL (ref 8.6–10.2)
CO2: 26 mmol/L (ref 20–29)
CREATININE: 1.18 mg/dL (ref 0.76–1.27)
Chloride: 103 mmol/L (ref 96–106)
GFR calc Af Amer: 67 mL/min/{1.73_m2} (ref >59)
GFR calc non Af Amer: 58 mL/min/{1.73_m2} — ABNORMAL LOW (ref >59)
Glucose: 94 mg/dL (ref 65–99)
POTASSIUM: 4 mmol/L (ref 3.5–5.2)
Sodium: 142 mmol/L (ref 134–144)

## 2018-04-02 LAB — HEPATIC FUNCTION PANEL
ALK PHOS: 58 IU/L (ref 39–117)
ALT: 15 IU/L (ref 0–44)
AST: 13 IU/L (ref 0–40)
Albumin: 4.4 g/dL (ref 3.5–4.7)
Bilirubin Total: 0.5 mg/dL (ref 0.0–1.2)
Bilirubin, Direct: 0.14 mg/dL (ref 0.00–0.40)
TOTAL PROTEIN: 6.3 g/dL (ref 6.0–8.5)

## 2018-04-02 LAB — LIPID PANEL W/O CHOL/HDL RATIO
Cholesterol, Total: 123 mg/dL (ref 100–199)
HDL: 43 mg/dL (ref >39)
LDL CALC: 64 mg/dL (ref 0–99)
TRIGLYCERIDES: 81 mg/dL (ref 0–149)
VLDL CHOLESTEROL CAL: 16 mg/dL (ref 5–40)

## 2018-04-02 MED ORDER — AMLODIPINE BESYLATE 10 MG PO TABS: 10.0000 mg | ORAL_TABLET | Freq: Every day | ORAL | 3 refills | Status: DC

## 2018-04-02 MED ORDER — HYDROCHLOROTHIAZIDE 12.5 MG PO CAPS: 12.5000 mg | ORAL_CAPSULE | Freq: Every day | ORAL | 3 refills | Status: DC

## 2018-04-02 MED ORDER — NITROGLYCERIN 0.4 MG SL SUBL: SUBLINGUAL_TABLET | SUBLINGUAL | 1 refills | Status: DC

## 2018-04-02 MED ORDER — LOSARTAN POTASSIUM 50 MG PO TABS: 50.0000 mg | ORAL_TABLET | Freq: Every day | ORAL | 3 refills | Status: DC

## 2018-04-02 MED ORDER — ATORVASTATIN CALCIUM 10 MG PO TABS: 10.0000 mg | ORAL_TABLET | Freq: Every day | ORAL | 3 refills | Status: DC

## 2018-04-02 NOTE — Patient Instructions (Signed)
Continue your current therapy  We will call with the results of your lab work  Follow up in 6 months.

## 2018-04-25 ENCOUNTER — Other Ambulatory Visit: Payer: Self-pay | Admitting: Cardiology

## 2018-05-06 ENCOUNTER — Ambulatory Visit: Payer: Medicare Other | Admitting: Neurology

## 2018-05-21 DIAGNOSIS — N400 Enlarged prostate without lower urinary tract symptoms: Secondary | ICD-10-CM | POA: Diagnosis not present

## 2018-05-21 DIAGNOSIS — N5201 Erectile dysfunction due to arterial insufficiency: Secondary | ICD-10-CM | POA: Diagnosis not present

## 2018-05-31 DIAGNOSIS — L570 Actinic keratosis: Secondary | ICD-10-CM | POA: Diagnosis not present

## 2018-05-31 DIAGNOSIS — L738 Other specified follicular disorders: Secondary | ICD-10-CM | POA: Diagnosis not present

## 2018-05-31 DIAGNOSIS — L821 Other seborrheic keratosis: Secondary | ICD-10-CM | POA: Diagnosis not present

## 2018-05-31 DIAGNOSIS — L812 Freckles: Secondary | ICD-10-CM | POA: Diagnosis not present

## 2018-05-31 DIAGNOSIS — D1801 Hemangioma of skin and subcutaneous tissue: Secondary | ICD-10-CM | POA: Diagnosis not present

## 2018-07-09 DIAGNOSIS — H35371 Puckering of macula, right eye: Secondary | ICD-10-CM | POA: Diagnosis not present

## 2018-07-09 DIAGNOSIS — Z961 Presence of intraocular lens: Secondary | ICD-10-CM | POA: Diagnosis not present

## 2018-07-09 DIAGNOSIS — D3131 Benign neoplasm of right choroid: Secondary | ICD-10-CM | POA: Diagnosis not present

## 2018-09-26 NOTE — Progress Notes (Deleted)
Cardiology Office Note   Date:  09/26/2018   ID:  Zachary Hall, DOB 26-Oct-1934, MRN [id number]  PCP:  None Cardiologist: Jatavius Ellenwood [name] MD  No chief complaint on file.     History of Present Illness: Zachary Hall is a 84 y.o. male is seen for follow up CAD.  He has a history of known ischemic heart disease. He underwent coronary artery bypass graft surgery on 02/07/10 by Dr. Cyndia Bent. Last Myoview in February 2015 was normal with EF 66%.   He does have some problems with his memory.He is followed  by neurology. MRI showed chronic small vessel ischemic changes but no acute findings. Felt to have mild dementia.  On follow up today he reports he is doing well. Denies any chest pain, dyspnea, palpitations. Does some yard work. No real exercise routine.   Past Medical History:  Diagnosis Date  . Bradycardia   . Coronary artery disease   . Gout   . History of atrial fibrillation    Previously on amiodarone  . Hx of CABG   . Hypercholesterolemia   . Hypertension   . Myocardial infarction ([place])   . Nocturia     Past Surgical History:  Procedure Laterality Date  . CARDIAC CATHETERIZATION  01/04/2010  . CORONARY ARTERY BYPASS GRAFT  02/07/2010   LIMA to LAD, SVG to 2nd DX, SVG to OM  . EYE SURGERY Bilateral    Cataract Extraction  . HEMORRHOID SURGERY     x 2  . HEMORRHOID SURGERY N/A 11/13/2012   Procedure: [place] Hemorrhoidectomy;  Surgeon: Merrie Roof, MD;  Location: [place];  Service: General;  Laterality: N/A;  . INGUINAL HERNIA REPAIR Right 01/22/2017   Procedure: RIGHT INGUINAL HERNIA REPAIR WITH MESH;  Surgeon: Armandina Gemma, MD;  Location: [place];  Service: General;  Laterality: Right;  . INSERTION OF MESH Right 01/22/2017   Procedure: INSERTION OF MESH;  Surgeon: Armandina Gemma, MD;  Location: [place];  Service: General;  Laterality: Right;  . NASAL POLYP EXCISION    . OTHER SURGICAL HISTORY  06/05/2002   Penile Implant     Current Outpatient Medications    Medication Sig Dispense Refill  . amLODipine (NORVASC) 10 MG tablet Take 1 tablet (10 mg total) by mouth daily. 90 tablet 3  . aspirin 81 MG tablet Take 81 mg by mouth every other day. At night    . atorvastatin (LIPITOR) 10 MG tablet Take 1 tablet (10 mg total) by mouth daily at 6 PM. 90 tablet 3  . cetirizine (ZYRTEC) 10 MG tablet Take 10 mg by mouth daily.    [name] CHERRY PO Take 1 capsule by mouth 2 (two) times daily. ** Black Cherry **     . clobetasol ointment (TEMOVATE) 7.01 % Apply 1 application topically daily as needed. Skin irritation    . donepezil (ARICEPT) 10 MG tablet Take 1 tablet daily 30 tablet 11  . fluocinonide cream (LIDEX) 7.79 % Apply 1 application topically as needed (dry skin).     . fluorouracil (EFUDEX) 5 % cream Apply 1 application topically daily. To top of scalp    . Fluticasone-Salmeterol (ADVAIR DISKUS) 250-50 MCG/DOSE AEPB Inhale 1 puff into the lungs every 12 (twelve) hours as needed (as needed for wheezing). Reported on 09/14/2015    . hydrochlorothiazide (MICROZIDE) 12.5 MG capsule Take 1 capsule (12.5 mg total) by mouth daily. 90 capsule 3  . losartan (COZAAR) 50 MG tablet Take 1 tablet (50 mg  total) by mouth daily. 90 tablet 3  . Multiple Vitamin (MULTIVITAMIN IRON-FREE) TABS Take 1 tablet by mouth daily.    . nitroGLYCERIN (NITROSTAT) 0.4 MG SL tablet TAKE SUBLINGUAL AS NEEDED FOR CHEST PAIN 25 tablet 3  . Omega-3 Fatty Acids (OMEGA-3 FISH OIL) 300 MG CAPS Take 300 mg by mouth 2 (two) times daily.     No current facility-administered medications for this visit.     Allergies:   Lisinopril    Social History:  The patient  reports that he quit smoking about 57 years ago. His smoking use included cigarettes. He has quit using smokeless tobacco. He reports current alcohol use of about 4.0 standard drinks of alcohol per week. He reports that he does not use drugs.   Family History:  The patient's family history includes Alcohol abuse in his father; Cancer in  his mother; Colon cancer in his mother.    ROS:  Please see the history of present illness.   Otherwise, review of systems are positive for none.   All other systems are reviewed and negative.    PHYSICAL EXAM: VS:  There were no vitals taken for this visit. , BMI There is no height or weight on file to calculate BMI. GENERAL:  Well appearing elderly WM in NAD HEENT:  PERRL, EOMI, sclera are clear. Oropharynx is clear. NECK:  No jugular venous distention, carotid upstroke brisk and symmetric, no bruits, no thyromegaly or adenopathy LUNGS:  Clear to auscultation bilaterally CHEST:  Unremarkable HEART:  RRR,  PMI not displaced or sustained,S1 and S2 within normal limits, no S3, no S4: no clicks, no rubs, no murmurs ABD:  Soft, nontender. BS +, no masses or bruits. No hepatomegaly, no splenomegaly EXT:  2 + pulses throughout, no edema, no cyanosis no clubbing SKIN:  Warm and dry.  No rashes NEURO:  Alert and oriented x 3. Cranial nerves II through XII intact. PSYCH:  Cognitively intact  EKG:  EKG  Is  Not ordered today.   Recent Labs: 04/02/2018: ALT 15; BUN 14; Creatinine, Ser 1.18; Potassium 4.0; Sodium 142    Lipid Panel    Component Value Date/Time   CHOL 123 04/02/2018 1127   TRIG 81 04/02/2018 1127   HDL 43 04/02/2018 1127   CHOLHDL 2.5 03/26/2017 1034   CHOLHDL 2.7 10/16/2016 1443   VLDL 32 (H) 10/16/2016 1443   LDLCALC 64 04/02/2018 1127      Wt Readings from Last 3 Encounters:  04/02/18 187 lb (84.8 kg)  10/25/17 192 lb (87.1 kg)  10/23/17 191 lb (86.6 kg)     ASSESSMENT AND PLAN:  1. Ischemic heart disease status post CABG 2011. Patient is asymptomatic. Last Myoview in Feb. 2015 was normal. Continue current medical therapy.  2. Bifascicular block- chronic- asymptomatic. Will monitor.  3. Hypercholesterolemia. Excellent control on statin. Repeat fasting lab today 4. Remote history of paroxysmal atrial fibrillation- no recurrence. 5. Benign hypertensive heart  disease without heart failure. BP is under good control. Continue current meds. 6. Memory disorder followed by Neurology. On Aricept.  Current medicines are reviewed at length with the patient today.  The patient does not have concerns regarding medicines.  The following changes have been made:  no change  Labs/ tests ordered today include:   No orders of the defined types were placed in this encounter.   Disposition: Continue current medication.  Follow up 6 months with lab work.  Signed, Asheley Hellberg [name] MD, [facility]   09/26/2018 7:43 AM  [facility]

## 2018-09-30 ENCOUNTER — Other Ambulatory Visit: Payer: Self-pay

## 2018-09-30 ENCOUNTER — Ambulatory Visit: Payer: Medicare Other | Admitting: Cardiology

## 2018-09-30 DIAGNOSIS — I2510 Atherosclerotic heart disease of native coronary artery without angina pectoris: Secondary | ICD-10-CM

## 2018-09-30 DIAGNOSIS — E785 Hyperlipidemia, unspecified: Secondary | ICD-10-CM

## 2018-09-30 DIAGNOSIS — I10 Essential (primary) hypertension: Secondary | ICD-10-CM

## 2018-10-01 ENCOUNTER — Telehealth: Payer: Self-pay

## 2018-10-01 DIAGNOSIS — R3982 Chronic bladder pain: Secondary | ICD-10-CM | POA: Diagnosis not present

## 2018-10-01 DIAGNOSIS — M545 Low back pain: Secondary | ICD-10-CM | POA: Diagnosis not present

## 2018-10-01 NOTE — Telephone Encounter (Signed)
Mrs Corinna Capra called to schedule appt for her husband who has had rt lower abd pain that radiates to back for 2 weeks. Pain level now is 8 and climbing.I spoke with [employer] office mgr and no available 30' appts at [facility]. Advised Mrs Corinna Capra would be glad to schedule an appt for pt to establish with one of our providers but for the abd pain he is having now if pt does not have PCP pt should be seen at [facility] or ED for eval. Mrs Corinna Capra voiced understanding and may cb for appt to establish. FYI to [employer].

## 2018-10-08 ENCOUNTER — Other Ambulatory Visit: Payer: Self-pay | Admitting: Family Medicine

## 2018-10-08 ENCOUNTER — Ambulatory Visit
Admission: RE | Admit: 2018-10-08 | Discharge: 2018-10-08 | Disposition: A | Discharge status: Home / Self Care | Payer: Medicare Other | Source: Ambulatory Visit | Attending: Family Medicine | Admitting: Family Medicine

## 2018-10-08 ENCOUNTER — Other Ambulatory Visit: Payer: Self-pay

## 2018-10-08 DIAGNOSIS — R1031 Right lower quadrant pain: Secondary | ICD-10-CM | POA: Insufficient documentation

## 2018-10-08 DIAGNOSIS — M5116 Intervertebral disc disorders with radiculopathy, lumbar region: Secondary | ICD-10-CM | POA: Diagnosis not present

## 2018-10-08 DIAGNOSIS — R1030 Lower abdominal pain, unspecified: Secondary | ICD-10-CM | POA: Diagnosis not present

## 2018-10-08 DIAGNOSIS — M25551 Pain in right hip: Secondary | ICD-10-CM | POA: Diagnosis not present

## 2018-10-09 DIAGNOSIS — M545 Low back pain: Secondary | ICD-10-CM | POA: Diagnosis not present

## 2018-10-22 DIAGNOSIS — E785 Hyperlipidemia, unspecified: Secondary | ICD-10-CM | POA: Diagnosis not present

## 2018-10-22 DIAGNOSIS — I10 Essential (primary) hypertension: Secondary | ICD-10-CM | POA: Diagnosis not present

## 2018-10-22 DIAGNOSIS — I2510 Atherosclerotic heart disease of native coronary artery without angina pectoris: Secondary | ICD-10-CM | POA: Diagnosis not present

## 2018-10-23 ENCOUNTER — Telehealth: Payer: Self-pay

## 2018-10-23 LAB — BASIC METABOLIC PANEL
BUN / CREAT RATIO: 13 (ref 10–24)
BUN: 17 mg/dL (ref 8–27)
CO2: 26 mmol/L (ref 20–29)
CREATININE: 1.27 mg/dL (ref 0.76–1.27)
Calcium: 9.4 mg/dL (ref 8.6–10.2)
Chloride: 101 mmol/L (ref 96–106)
GFR calc Af Amer: 60 mL/min/{1.73_m2} (ref >59)
GFR calc non Af Amer: 52 mL/min/{1.73_m2} — ABNORMAL LOW (ref >59)
Glucose: 97 mg/dL (ref 65–99)
Potassium: 4.2 mmol/L (ref 3.5–5.2)
Sodium: 141 mmol/L (ref 134–144)

## 2018-10-23 LAB — HEPATIC FUNCTION PANEL
ALT: 16 IU/L (ref 0–44)
AST: 15 IU/L (ref 0–40)
Albumin: 4.4 g/dL (ref 3.6–4.6)
Alkaline Phosphatase: 66 IU/L (ref 39–117)
Bilirubin Total: 0.5 mg/dL (ref 0.0–1.2)
Bilirubin, Direct: 0.15 mg/dL (ref 0.00–0.40)
Total Protein: 6.1 g/dL (ref 6.0–8.5)

## 2018-10-23 LAB — LIPID PANEL
CHOLESTEROL TOTAL: 110 mg/dL (ref 100–199)
Chol/HDL Ratio: 2.9 ratio (ref 0.0–5.0)
HDL: 38 mg/dL — AB (ref >39)
LDL CALC: 49 mg/dL (ref 0–99)
TRIGLYCERIDES: 113 mg/dL (ref 0–149)
VLDL CHOLESTEROL CAL: 23 mg/dL (ref 5–40)

## 2018-10-23 NOTE — Telephone Encounter (Signed)
   Primary Cardiologist:  Dr.Jordan   Patient contacted.  History reviewed.  No symptoms to suggest any unstable cardiac conditions.  Based on discussion, with current pandemic situation, we will be postponing this 10/30/18 appointment for Zachary Hall with a plan for f/u in 4 to 6 wks or sooner if feasible/necessary.  If symptoms change, he has been instructed to contact our office.   Routing to C19 CANCEL pool for tracking in 4 to 6 weeks.  Kathyrn Lass, LPN  6/73/4193 7:90 PM         .

## 2018-10-30 ENCOUNTER — Ambulatory Visit: Payer: Medicare Other | Admitting: Cardiology

## 2018-11-01 ENCOUNTER — Telehealth: Payer: Self-pay | Admitting: Physician Assistant

## 2018-11-01 NOTE — Telephone Encounter (Signed)
   TELEPHONE CALL NOTE - RESCHEDULING OPV CANCELLATIONS DUE TO COVID-19  Primary Cardiologist:Peter [name], MD  This patient has been deemed a candidate for a follow-up tele-health visit to limit community exposure during the Covid-19 pandemic. I spoke with the patient via phone 11/01/2018.  I spoke with the patient's wife and they do not want to do a telehealth visit.  They are fine waiting for an in person visit scheduled in August or September.  I advised him to call our office if any new symptoms occurred.  Please schedule patient for an inpatient visit with Dr. [name] in August or September 2020.  I will forward this appointment request to the appropriate scheduling pool and the primary cardiologist's assist. I will remove this patient from the C19 cancellation pool.    Tami Lin Duke, PA 11/01/2018, 10:57 AM

## 2018-11-01 NOTE — Telephone Encounter (Signed)
Spoke to patient's wife appointment scheduled with Dr.Jordan 03/24/19 at 1:20 pm.Advised to call sooner if needed.

## 2018-12-03 DIAGNOSIS — L738 Other specified follicular disorders: Secondary | ICD-10-CM | POA: Diagnosis not present

## 2018-12-03 DIAGNOSIS — D225 Melanocytic nevi of trunk: Secondary | ICD-10-CM | POA: Diagnosis not present

## 2018-12-03 DIAGNOSIS — D2262 Melanocytic nevi of left upper limb, including shoulder: Secondary | ICD-10-CM | POA: Diagnosis not present

## 2018-12-03 DIAGNOSIS — D2272 Melanocytic nevi of left lower limb, including hip: Secondary | ICD-10-CM | POA: Diagnosis not present

## 2018-12-03 DIAGNOSIS — D485 Neoplasm of uncertain behavior of skin: Secondary | ICD-10-CM | POA: Diagnosis not present

## 2018-12-03 DIAGNOSIS — D2261 Melanocytic nevi of right upper limb, including shoulder: Secondary | ICD-10-CM | POA: Diagnosis not present

## 2018-12-03 DIAGNOSIS — D2271 Melanocytic nevi of right lower limb, including hip: Secondary | ICD-10-CM | POA: Diagnosis not present

## 2018-12-03 DIAGNOSIS — L570 Actinic keratosis: Secondary | ICD-10-CM | POA: Diagnosis not present

## 2018-12-03 DIAGNOSIS — L821 Other seborrheic keratosis: Secondary | ICD-10-CM | POA: Diagnosis not present

## 2019-01-06 DIAGNOSIS — D3131 Benign neoplasm of right choroid: Secondary | ICD-10-CM | POA: Diagnosis not present

## 2019-01-06 DIAGNOSIS — H43811 Vitreous degeneration, right eye: Secondary | ICD-10-CM | POA: Diagnosis not present

## 2019-01-06 DIAGNOSIS — H35371 Puckering of macula, right eye: Secondary | ICD-10-CM | POA: Diagnosis not present

## 2019-02-06 DIAGNOSIS — K625 Hemorrhage of anus and rectum: Secondary | ICD-10-CM | POA: Diagnosis not present

## 2019-02-06 DIAGNOSIS — K5901 Slow transit constipation: Secondary | ICD-10-CM | POA: Diagnosis not present

## 2019-02-06 DIAGNOSIS — Z800 Family history of malignant neoplasm of digestive organs: Secondary | ICD-10-CM | POA: Diagnosis not present

## 2019-02-06 DIAGNOSIS — Z86010 Personal history of colonic polyps: Secondary | ICD-10-CM | POA: Diagnosis not present

## 2019-03-23 NOTE — Progress Notes (Signed)
Cardiology Office Note   Date:  03/24/2019   ID:  Zachary Hall, DOB [birth year], MRN [id number]  PCP:  None Cardiologist: La Shehan [name] MD  Chief Complaint  Patient presents with  . Coronary Artery Disease      History of Present Illness: Zachary Hall is a 84 y.o. male is seen for follow up CAD.  He has a history of known ischemic heart disease. He underwent coronary artery bypass graft surgery on 02/07/10 by Dr. Cyndia Bent. Last Myoview in February 2015 was normal with EF 66%.   His wife is concerned that he has more cognitive decline. Forgets things. Loses his hearing aids. Marland KitchenHe is followed  by neurology. MRI showed chronic small vessel ischemic changes but no acute findings.   He denies any chest pain, dyspnea, palpitations. Does some yard work for activity. No dizziness or syncope.  Past Medical History:  Diagnosis Date  . Bradycardia   . Coronary artery disease   . Gout   . History of atrial fibrillation    Previously on amiodarone  . Hx of CABG   . Hypercholesterolemia   . Hypertension   . Myocardial infarction ([place])   . Nocturia     Past Surgical History:  Procedure Laterality Date  . CARDIAC CATHETERIZATION  01/04/2010  . CORONARY ARTERY BYPASS GRAFT  02/07/2010   LIMA to LAD, SVG to 2nd DX, SVG to OM  . EYE SURGERY Bilateral    Cataract Extraction  . HEMORRHOID SURGERY     x 2  . HEMORRHOID SURGERY N/A 11/13/2012   Procedure: [place] Hemorrhoidectomy;  Surgeon: Merrie Roof, MD;  Location: [facility];  Service: General;  Laterality: N/A;  . INGUINAL HERNIA REPAIR Right 01/22/2017   Procedure: RIGHT INGUINAL HERNIA REPAIR WITH MESH;  Surgeon: Armandina Gemma, MD;  Location: [facility];  Service: General;  Laterality: Right;  . INSERTION OF MESH Right 01/22/2017   Procedure: INSERTION OF MESH;  Surgeon: Armandina Gemma, MD;  Location: [facility];  Service: General;  Laterality: Right;  . NASAL POLYP EXCISION    . OTHER SURGICAL HISTORY  06/05/2002   Penile Implant      Current Outpatient Medications  Medication Sig Dispense Refill  . amLODipine (NORVASC) 10 MG tablet Take 1 tablet (10 mg total) by mouth daily. 90 tablet 3  . aspirin 81 MG tablet Take 81 mg by mouth every other day. At night    . atorvastatin (LIPITOR) 10 MG tablet Take 1 tablet (10 mg total) by mouth daily at 6 PM. 90 tablet 3  . cetirizine (ZYRTEC) 10 MG tablet Take 10 mg by mouth daily.    [name] CHERRY PO Take 1 capsule by mouth 2 (two) times daily. ** Black Cherry **     . clobetasol ointment (TEMOVATE) AB-123456789 % Apply 1 application topically daily as needed. Skin irritation    . fluocinonide cream (LIDEX) AB-123456789 % Apply 1 application topically as needed (dry skin).     . fluorouracil (EFUDEX) 5 % cream Apply 1 application topically daily. To top of scalp    . Fluticasone-Salmeterol (ADVAIR DISKUS) 250-50 MCG/DOSE AEPB Inhale 1 puff into the lungs every 12 (twelve) hours as needed (as needed for wheezing). Reported on 09/14/2015    . hydrochlorothiazide (MICROZIDE) 12.5 MG capsule Take 1 capsule (12.5 mg total) by mouth daily. 90 capsule 3  . losartan (COZAAR) 50 MG tablet Take 1 tablet (50 mg total) by mouth daily. 90 tablet 3  . Multiple Vitamin (  MULTIVITAMIN IRON-FREE) TABS Take 1 tablet by mouth daily.    . nitroGLYCERIN (NITROSTAT) 0.4 MG SL tablet TAKE SUBLINGUAL AS NEEDED FOR CHEST PAIN 25 tablet 3  . Omega-3 Fatty Acids (OMEGA-3 FISH OIL) 300 MG CAPS Take 300 mg by mouth 2 (two) times daily.    [name] donepezil (ARICEPT) 10 MG tablet Take 1 tablet daily (Patient not taking: Reported on 03/24/2019) 30 tablet 11   No current facility-administered medications for this visit.     Allergies:   Lisinopril    Social History:  The patient  reports that he quit smoking about 58 years ago. His smoking use included cigarettes. He has quit using smokeless tobacco. He reports current alcohol use of about 4.0 standard drinks of alcohol per week. He reports that he does not use drugs.   Family History:  The  patient's family history includes Alcohol abuse in his father; Cancer in his mother; Colon cancer in his mother.    ROS:  Please see the history of present illness.   Otherwise, review of systems are positive for none.   All other systems are reviewed and negative.    PHYSICAL EXAM: VS:  BP 102/70   Pulse (!) 57   Temp 97.7 F (36.5 C)   Ht 5\' 11"  (1.803 m)   Wt 182 lb (82.6 kg)   SpO2 98%   BMI 25.38 kg/m  , BMI Body mass index is 25.38 kg/m. GENERAL:  Well appearing elderly WM in NAD HEENT:  PERRL, EOMI, sclera are clear. Oropharynx is clear. NECK:  No jugular venous distention, carotid upstroke brisk and symmetric, no bruits, no thyromegaly or adenopathy LUNGS:  Clear to auscultation bilaterally CHEST:  Unremarkable HEART:  RRR,  PMI not displaced or sustained,S1 and S2 within normal limits, no S3, no S4: no clicks, no rubs, no murmurs ABD:  Soft, nontender. BS +, no masses or bruits. No hepatomegaly, no splenomegaly EXT:  2 + pulses throughout, no edema, no cyanosis no clubbing SKIN:  Warm and dry.  No rashes NEURO:  Alert and oriented x 3. Cranial nerves II through XII intact. PSYCH:  Cognitively intact  EKG:  EKG  Is ordered today. Sinus brady rate 46. RBBB. I have personally reviewed and interpreted this study.    Recent Labs: 10/22/2018: ALT 16; BUN 17; Creatinine, Ser 1.27; Potassium 4.2; Sodium 141    Lipid Panel    Component Value Date/Time   CHOL 110 10/22/2018 0924   TRIG 113 10/22/2018 0924   HDL 38 (L) 10/22/2018 0924   CHOLHDL 2.9 10/22/2018 0924   CHOLHDL 2.7 10/16/2016 1443   VLDL 32 (H) 10/16/2016 1443   LDLCALC 49 10/22/2018 0924      Wt Readings from Last 3 Encounters:  03/24/19 182 lb (82.6 kg)  04/02/18 187 lb (84.8 kg)  10/25/17 192 lb (87.1 kg)     ASSESSMENT AND PLAN:  1. Ischemic heart disease status post CABG 2011. Patient is asymptomatic. Last Myoview in Feb. 2015 was normal. Continue current medical therapy.  2. RBBB with sinus  bradycardia. Chronic- asymptomatic. Will monitor.  3. Hypercholesterolemia. Excellent control on statin.  4. Remote history of paroxysmal atrial fibrillation- no recurrence. 5. Benign hypertensive heart disease without heart failure. BP is under good control. Continue current meds. 6. Memory disorder followed by Neurology. On Aricept.  Current medicines are reviewed at length with the patient today.  The patient does not have concerns regarding medicines.  The following changes have been made:  no  change  Labs/ tests ordered today include:   No orders of the defined types were placed in this encounter.   Disposition: Continue current medication.  Follow up 6 months with lab work.  Signed, Modesty Rudy [name] MD, [facility]   03/24/2019 2:05 PM    [place]

## 2019-03-24 ENCOUNTER — Ambulatory Visit (INDEPENDENT_AMBULATORY_CARE_PROVIDER_SITE_OTHER): Payer: Medicare Other | Admitting: Cardiology

## 2019-03-24 ENCOUNTER — Other Ambulatory Visit: Payer: Self-pay

## 2019-03-24 ENCOUNTER — Encounter: Payer: Self-pay | Admitting: Cardiology

## 2019-03-24 VITALS — BP 102/70 | HR 57 | Temp 97.7°F | Ht 71.0 in | Wt 182.0 lb

## 2019-03-24 DIAGNOSIS — Z951 Presence of aortocoronary bypass graft: Secondary | ICD-10-CM | POA: Diagnosis not present

## 2019-03-24 DIAGNOSIS — H524 Presbyopia: Secondary | ICD-10-CM | POA: Diagnosis not present

## 2019-03-24 DIAGNOSIS — H35371 Puckering of macula, right eye: Secondary | ICD-10-CM | POA: Diagnosis not present

## 2019-03-24 DIAGNOSIS — I2510 Atherosclerotic heart disease of native coronary artery without angina pectoris: Secondary | ICD-10-CM

## 2019-03-24 DIAGNOSIS — I4510 Unspecified right bundle-branch block: Secondary | ICD-10-CM

## 2019-03-24 DIAGNOSIS — I10 Essential (primary) hypertension: Secondary | ICD-10-CM

## 2019-03-24 DIAGNOSIS — H52203 Unspecified astigmatism, bilateral: Secondary | ICD-10-CM | POA: Diagnosis not present

## 2019-03-24 DIAGNOSIS — E785 Hyperlipidemia, unspecified: Secondary | ICD-10-CM

## 2019-03-24 DIAGNOSIS — Z961 Presence of intraocular lens: Secondary | ICD-10-CM | POA: Diagnosis not present

## 2019-03-24 NOTE — Addendum Note (Signed)
Addended by: Kathyrn Lass on: 03/24/2019 02:12 PM   Modules accepted: Orders

## 2019-05-01 ENCOUNTER — Other Ambulatory Visit: Payer: Self-pay

## 2019-05-01 MED ORDER — AMLODIPINE BESYLATE 10 MG PO TABS: 10.0000 mg | ORAL_TABLET | Freq: Every day | ORAL | 3 refills | Status: DC

## 2019-05-01 MED ORDER — HYDROCHLOROTHIAZIDE 12.5 MG PO CAPS: 12.5000 mg | ORAL_CAPSULE | Freq: Every day | ORAL | 3 refills | Status: DC

## 2019-05-01 MED ORDER — ATORVASTATIN CALCIUM 10 MG PO TABS: 10.0000 mg | ORAL_TABLET | Freq: Every day | ORAL | 3 refills | Status: DC

## 2019-05-01 MED ORDER — LOSARTAN POTASSIUM 50 MG PO TABS: 50.0000 mg | ORAL_TABLET | Freq: Every day | ORAL | 3 refills | Status: DC

## 2019-05-26 ENCOUNTER — Other Ambulatory Visit: Payer: Self-pay

## 2019-05-26 MED ORDER — NITROGLYCERIN 0.4 MG SL SUBL: SUBLINGUAL_TABLET | SUBLINGUAL | 3 refills | Status: DC

## 2019-05-30 DIAGNOSIS — H905 Unspecified sensorineural hearing loss: Secondary | ICD-10-CM | POA: Diagnosis not present

## 2019-06-03 ENCOUNTER — Other Ambulatory Visit: Payer: Self-pay | Admitting: Cardiology

## 2019-06-05 DIAGNOSIS — L821 Other seborrheic keratosis: Secondary | ICD-10-CM | POA: Diagnosis not present

## 2019-06-05 DIAGNOSIS — L570 Actinic keratosis: Secondary | ICD-10-CM | POA: Diagnosis not present

## 2019-06-05 DIAGNOSIS — L812 Freckles: Secondary | ICD-10-CM | POA: Diagnosis not present

## 2019-06-05 DIAGNOSIS — L738 Other specified follicular disorders: Secondary | ICD-10-CM | POA: Diagnosis not present

## 2019-06-11 ENCOUNTER — Other Ambulatory Visit: Payer: Self-pay

## 2019-08-19 ENCOUNTER — Other Ambulatory Visit: Payer: Medicare Other

## 2019-09-04 DIAGNOSIS — D0439 Carcinoma in situ of skin of other parts of face: Secondary | ICD-10-CM | POA: Diagnosis not present

## 2019-09-04 DIAGNOSIS — L821 Other seborrheic keratosis: Secondary | ICD-10-CM | POA: Diagnosis not present

## 2019-09-11 DIAGNOSIS — Z951 Presence of aortocoronary bypass graft: Secondary | ICD-10-CM | POA: Diagnosis not present

## 2019-09-11 DIAGNOSIS — I4510 Unspecified right bundle-branch block: Secondary | ICD-10-CM | POA: Diagnosis not present

## 2019-09-11 DIAGNOSIS — I10 Essential (primary) hypertension: Secondary | ICD-10-CM | POA: Diagnosis not present

## 2019-09-11 DIAGNOSIS — I2510 Atherosclerotic heart disease of native coronary artery without angina pectoris: Secondary | ICD-10-CM | POA: Diagnosis not present

## 2019-09-11 DIAGNOSIS — E785 Hyperlipidemia, unspecified: Secondary | ICD-10-CM | POA: Diagnosis not present

## 2019-09-11 DIAGNOSIS — D0439 Carcinoma in situ of skin of other parts of face: Secondary | ICD-10-CM | POA: Diagnosis not present

## 2019-09-11 LAB — HEPATIC FUNCTION PANEL
ALT: 15 IU/L (ref 0–44)
AST: 18 IU/L (ref 0–40)
Albumin: 4.1 g/dL (ref 3.6–4.6)
Alkaline Phosphatase: 65 IU/L (ref 39–117)
Bilirubin Total: 0.6 mg/dL (ref 0.0–1.2)
Bilirubin, Direct: 0.17 mg/dL (ref 0.00–0.40)
Total Protein: 6.2 g/dL (ref 6.0–8.5)

## 2019-09-11 LAB — BASIC METABOLIC PANEL
BUN/Creatinine Ratio: 12 (ref 10–24)
BUN: 15 mg/dL (ref 8–27)
CO2: 25 mmol/L (ref 20–29)
Calcium: 8.9 mg/dL (ref 8.6–10.2)
Chloride: 102 mmol/L (ref 96–106)
Creatinine, Ser: 1.27 mg/dL (ref 0.76–1.27)
GFR calc Af Amer: 60 mL/min/{1.73_m2} (ref >59)
GFR calc non Af Amer: 52 mL/min/{1.73_m2} — ABNORMAL LOW (ref >59)
Glucose: 90 mg/dL (ref 65–99)
Potassium: 4.2 mmol/L (ref 3.5–5.2)
Sodium: 141 mmol/L (ref 134–144)

## 2019-09-11 LAB — LIPID PANEL
Chol/HDL Ratio: 2.7 ratio (ref 0.0–5.0)
Cholesterol, Total: 121 mg/dL (ref 100–199)
HDL: 45 mg/dL (ref >39)
LDL Chol Calc (NIH): 53 mg/dL (ref 0–99)
Triglycerides: 134 mg/dL (ref 0–149)
VLDL Cholesterol Cal: 23 mg/dL (ref 5–40)

## 2019-09-18 NOTE — Progress Notes (Signed)
Cardiology Office Note   Date:  09/22/2019   ID:  Zachary Hall, DOB 04-15-1935, MRN [id number]  PCP:  None Cardiologist: Zachary Mulligan [name] MD  Chief Complaint  Patient presents with  . Coronary Artery Disease      History of Present Illness: Zachary Hall is a 85 y.o. male is seen for follow up CAD.  He has a history of known ischemic heart disease. He underwent coronary artery bypass graft surgery on 02/07/10 by Dr. Cyndia Hall. Last Myoview in February 2015 was normal with EF 66%.   His wife is concerned that he has more cognitive decline. Forgets things. Loses his hearing aids. Marland KitchenHe is followed  by neurology. MRI showed chronic small vessel ischemic changes but no acute findings.   On follow up he denies any chest pain, dyspnea, palpitations. Does some yard work for activity. Splits wood with a wood splitter.  No dizziness or syncope. Wife thinks memory is a little worse.  Past Medical History:  Diagnosis Date  . Bradycardia   . Coronary artery disease   . Gout   . History of atrial fibrillation    Previously on amiodarone  . Hx of CABG   . Hypercholesterolemia   . Hypertension   . Myocardial infarction ([place])   . Nocturia     Past Surgical History:  Procedure Laterality Date  . CARDIAC CATHETERIZATION  01/04/2010  . CORONARY ARTERY BYPASS GRAFT  02/07/2010   LIMA to LAD, SVG to 2nd DX, SVG to OM  . EYE SURGERY Bilateral    Cataract Extraction  . HEMORRHOID SURGERY     x 2  . HEMORRHOID SURGERY N/A 11/13/2012   Procedure: [place] Hemorrhoidectomy;  Surgeon: Zachary Roof, MD;  Location: [facility];  Service: General;  Laterality: N/A;  . INGUINAL HERNIA REPAIR Right 01/22/2017   Procedure: RIGHT INGUINAL HERNIA REPAIR WITH MESH;  Surgeon: Armandina Gemma, MD;  Location: [facility];  Service: General;  Laterality: Right;  . INSERTION OF MESH Right 01/22/2017   Procedure: INSERTION OF MESH;  Surgeon: Armandina Gemma, MD;  Location: [facility];  Service: General;  Laterality: Right;  . NASAL  POLYP EXCISION    . OTHER SURGICAL HISTORY  06/05/2002   Penile Implant     Current Outpatient Medications  Medication Sig Dispense Refill  . amLODipine (NORVASC) 10 MG tablet Take 1 tablet (10 mg total) by mouth daily. 90 tablet 3  . aspirin 81 MG EC tablet Take by mouth.    [name] aspirin 81 MG tablet Take 81 mg by mouth every other day. At night    . atorvastatin (LIPITOR) 10 MG tablet Take 1 tablet (10 mg total) by mouth daily at 6 PM. 90 tablet 3  . cetirizine (ZYRTEC) 10 MG tablet Take by mouth.    [name] CHERRY PO Take 1 capsule by mouth 2 (two) times daily. ** Black Cherry **     . cherry syrup syrup Take by mouth.    . clobetasol ointment (TEMOVATE) AB-123456789 % Apply 1 application topically daily as needed. Skin irritation    . donepezil (ARICEPT) 10 MG tablet Take 1 tablet daily 30 tablet 11  . fluocinonide cream (LIDEX) AB-123456789 % Apply 1 application topically as needed (dry skin).     . fluorouracil (EFUDEX) 5 % cream Apply 1 application topically daily. To top of scalp    . Fluticasone-Salmeterol (ADVAIR DISKUS) 100-50 MCG/DOSE AEPB     . Fluticasone-Salmeterol (ADVAIR DISKUS) 250-50 MCG/DOSE AEPB Inhale 1  puff into the lungs every 12 (twelve) hours as needed (as needed for wheezing). Reported on 09/14/2015    . hydrochlorothiazide (MICROZIDE) 12.5 MG capsule Take 1 capsule (12.5 mg total) by mouth daily. 90 capsule 3  . losartan (COZAAR) 50 MG tablet Take 1 tablet (50 mg total) by mouth daily. 90 tablet 3  . Multiple Vitamin (MULTIVITAMIN IRON-FREE) TABS Take 1 tablet by mouth daily.    . Multiple Vitamins-Minerals (CENTRUM ADULTS) TABS Take by mouth.    . nitroGLYCERIN (NITROSTAT) 0.4 MG SL tablet *PLACE 1 TABLET UNDER TONGUE EVERY 5 MINS., UP TO 3 DOSES AS NEEDED FOR CHEST PAIN* 25 tablet 3  . Omega-3 Fatty Acids (FISH OIL) 1000 MG CAPS Take by mouth.    . Omega-3 Fatty Acids (OMEGA-3 FISH OIL) 300 MG CAPS Take 300 mg by mouth 2 (two) times daily.    . Omega-3 Fatty Acids (OMEGA-3 FISH OIL)  300 MG CAPS Take by mouth.     No current facility-administered medications for this visit.    Allergies:   Lisinopril    Social History:  The patient  reports that he quit smoking about 58 years ago. His smoking use included cigarettes. He has quit using smokeless tobacco. He reports current alcohol use of about 4.0 standard drinks of alcohol per week. He reports that he does not use drugs.   Family History:  The patient's family history includes Alcohol abuse in his father; Cancer in his mother; Colon cancer in his mother.    ROS:  Please see the history of present illness.   Otherwise, review of systems are positive for none.   All other systems are reviewed and negative.    PHYSICAL EXAM: VS:  BP 140/78   Pulse (!) 51   Temp (!) 96.8 F (36 C)   Ht 5\' 11"  (1.803 m)   Wt 185 lb 3.2 oz (84 kg)   SpO2 98%   BMI 25.83 kg/m  , BMI Body mass index is 25.83 kg/m. GENERAL:  Well appearing elderly WM in NAD HEENT:  PERRL, EOMI, sclera are clear. Oropharynx is clear. NECK:  No jugular venous distention, carotid upstroke brisk and symmetric, no bruits, no thyromegaly or adenopathy LUNGS:  Clear to auscultation bilaterally CHEST:  Unremarkable HEART:  RRR,  PMI not displaced or sustained,S1 and S2 within normal limits, no S3, no S4: no clicks, no rubs, no murmurs ABD:  Soft, nontender. BS +, no masses or bruits. No hepatomegaly, no splenomegaly EXT:  2 + pulses throughout, no edema, no cyanosis no clubbing SKIN:  Warm and dry.  No rashes NEURO:  Alert and oriented x 3. Cranial nerves II through XII intact. PSYCH:  Cognitively intact  EKG:  EKG  Is not ordered today.     Recent Labs: 09/11/2019: ALT 15; BUN 15; Creatinine, Ser 1.27; Potassium 4.2; Sodium 141    Lipid Panel    Component Value Date/Time   CHOL 121 09/11/2019 0930   TRIG 134 09/11/2019 0930   HDL 45 09/11/2019 0930   CHOLHDL 2.7 09/11/2019 0930   CHOLHDL 2.7 10/16/2016 1443   VLDL 32 (H) 10/16/2016 1443    LDLCALC 53 09/11/2019 0930      Wt Readings from Last 3 Encounters:  09/22/19 185 lb 3.2 oz (84 kg)  03/24/19 182 lb (82.6 kg)  04/02/18 187 lb (84.8 kg)     ASSESSMENT AND PLAN:  1. Ischemic heart disease status post CABG 2011. Patient is asymptomatic. Last Myoview in Feb. 2015  was normal. Continue current medical therapy.  2. RBBB with sinus bradycardia. Chronic- asymptomatic. Will monitor.  3. Hypercholesterolemia. Excellent control on statin.  4. Remote history of paroxysmal atrial fibrillation- no recurrence. 5. Benign hypertensive heart disease without heart failure. BP is under good control. Continue current meds. 6. Memory disorder followed by Neurology. On Aricept.  Current medicines are reviewed at length with the patient today.  The patient does not have concerns regarding medicines.  The following changes have been made:  no change  Labs/ tests ordered today include:   No orders of the defined types were placed in this encounter.   Disposition: Continue current medication.  Follow up 6 months   Signed, Jashon Ishida [name] MD, [facility]   09/22/2019 3:28 PM    [place]

## 2019-09-22 ENCOUNTER — Other Ambulatory Visit: Payer: Self-pay

## 2019-09-22 ENCOUNTER — Encounter (INDEPENDENT_AMBULATORY_CARE_PROVIDER_SITE_OTHER): Payer: Self-pay

## 2019-09-22 ENCOUNTER — Encounter: Payer: Self-pay | Admitting: Cardiology

## 2019-09-22 ENCOUNTER — Ambulatory Visit: Payer: Medicare Other | Admitting: Cardiology

## 2019-09-22 VITALS — BP 140/78 | HR 51 | Temp 96.8°F | Ht 71.0 in | Wt 185.2 lb

## 2019-09-22 DIAGNOSIS — I2510 Atherosclerotic heart disease of native coronary artery without angina pectoris: Secondary | ICD-10-CM | POA: Diagnosis not present

## 2019-09-22 DIAGNOSIS — I10 Essential (primary) hypertension: Secondary | ICD-10-CM

## 2019-09-22 DIAGNOSIS — E7800 Pure hypercholesterolemia, unspecified: Secondary | ICD-10-CM

## 2019-09-22 DIAGNOSIS — Z951 Presence of aortocoronary bypass graft: Secondary | ICD-10-CM

## 2019-09-22 DIAGNOSIS — I4510 Unspecified right bundle-branch block: Secondary | ICD-10-CM | POA: Diagnosis not present

## 2019-09-22 MED ORDER — NITROGLYCERIN 0.4 MG SL SUBL: SUBLINGUAL_TABLET | SUBLINGUAL | 3 refills | Status: DC

## 2019-10-07 DIAGNOSIS — Z961 Presence of intraocular lens: Secondary | ICD-10-CM | POA: Diagnosis not present

## 2019-10-07 DIAGNOSIS — H35371 Puckering of macula, right eye: Secondary | ICD-10-CM | POA: Diagnosis not present

## 2019-10-07 DIAGNOSIS — D3131 Benign neoplasm of right choroid: Secondary | ICD-10-CM | POA: Diagnosis not present

## 2019-10-07 DIAGNOSIS — H43811 Vitreous degeneration, right eye: Secondary | ICD-10-CM | POA: Diagnosis not present

## 2019-12-03 DIAGNOSIS — Z85828 Personal history of other malignant neoplasm of skin: Secondary | ICD-10-CM | POA: Diagnosis not present

## 2019-12-03 DIAGNOSIS — D225 Melanocytic nevi of trunk: Secondary | ICD-10-CM | POA: Diagnosis not present

## 2019-12-03 DIAGNOSIS — D2262 Melanocytic nevi of left upper limb, including shoulder: Secondary | ICD-10-CM | POA: Diagnosis not present

## 2019-12-03 DIAGNOSIS — L570 Actinic keratosis: Secondary | ICD-10-CM | POA: Diagnosis not present

## 2019-12-03 DIAGNOSIS — D485 Neoplasm of uncertain behavior of skin: Secondary | ICD-10-CM | POA: Diagnosis not present

## 2019-12-03 DIAGNOSIS — L821 Other seborrheic keratosis: Secondary | ICD-10-CM | POA: Diagnosis not present

## 2019-12-03 DIAGNOSIS — D044 Carcinoma in situ of skin of scalp and neck: Secondary | ICD-10-CM | POA: Diagnosis not present

## 2020-01-01 DIAGNOSIS — D485 Neoplasm of uncertain behavior of skin: Secondary | ICD-10-CM | POA: Diagnosis not present

## 2020-01-01 DIAGNOSIS — L988 Other specified disorders of the skin and subcutaneous tissue: Secondary | ICD-10-CM | POA: Diagnosis not present

## 2020-02-10 ENCOUNTER — Other Ambulatory Visit: Payer: Self-pay

## 2020-02-10 ENCOUNTER — Ambulatory Visit (INDEPENDENT_AMBULATORY_CARE_PROVIDER_SITE_OTHER): Payer: Medicare Other | Admitting: Ophthalmology

## 2020-02-10 ENCOUNTER — Encounter (INDEPENDENT_AMBULATORY_CARE_PROVIDER_SITE_OTHER): Payer: Self-pay | Admitting: Ophthalmology

## 2020-02-10 DIAGNOSIS — Z961 Presence of intraocular lens: Secondary | ICD-10-CM | POA: Diagnosis not present

## 2020-02-10 DIAGNOSIS — H43811 Vitreous degeneration, right eye: Secondary | ICD-10-CM

## 2020-02-10 DIAGNOSIS — D3131 Benign neoplasm of right choroid: Secondary | ICD-10-CM | POA: Diagnosis not present

## 2020-02-10 DIAGNOSIS — H35371 Puckering of macula, right eye: Secondary | ICD-10-CM

## 2020-02-10 HISTORY — DX: Puckering of macula, right eye: H35.371

## 2020-02-10 NOTE — Assessment & Plan Note (Signed)
OD with persistent foveal thickening and epiretinal membrane induced and temporal to the fovea.  I have instructed and demonstrated to the patient how to check monocular vision testing at near with reading glasses to compare acuity right eye alone versus left eye alone.  Should the right eyes start to decline to the point of impacting his activities of daily living, discussed potential improvement of this via vitrectomy membrane peel of this epiretinal membrane

## 2020-02-10 NOTE — Progress Notes (Signed)
02/10/2020     CHIEF COMPLAINT Patient presents for Retina Follow Up   HISTORY OF PRESENT ILLNESS: Zachary Hall is a 85 y.o. male who presents to the clinic today for:   HPI    Retina Follow Up    Diagnosis: ERM.  In right eye.  Severity is moderate.  Duration of 4 months.  Since onset it is stable.  I, the attending physician,  performed the HPI with the patient and updated documentation appropriately.          Comments    4 Month f\u OU. OCT  Pt c/o a decrease in DVA. Pt states he now has to be about 9' away to be able to see his TV clearly. Pt states vision is better some days than others.       Last edited by Tilda Franco on 02/10/2020 12:58 PM. (History)      Referring physician: Hurman Horn, MD [address] [place],  [place] [address]  HISTORICAL INFORMATION:   Selected notes from the MEDICAL RECORD NUMBER    Lab Results  Component Value Date   HGBA1C  02/03/2010    5.6 (NOTE)                                                                       According to the ADA Clinical Practice Recommendations for 2011, when HbA1c is used as a screening test:   >=6.5%   Diagnostic of Diabetes Mellitus           (if abnormal result  is confirmed)  5.7-6.4%   Increased risk of developing Diabetes Mellitus  References:Diagnosis and Classification of Diabetes Mellitus,Diabetes Care,2011,34(Suppl 1):S62-S69 and Standards of Medical Care in         Diabetes - 2011,Diabetes KZSW,1093,23  (Suppl 1):S11-S61.     CURRENT MEDICATIONS: No current outpatient medications on file. (Ophthalmic Drugs)   No current facility-administered medications for this visit. (Ophthalmic Drugs)   Current Outpatient Medications (Other)  Medication Sig  . amLODipine (NORVASC) 10 MG tablet Take 1 tablet (10 mg total) by mouth daily.  [name] aspirin 81 MG EC tablet Take by mouth.  [name] aspirin 81 MG tablet Take 81 mg by mouth every other day. At night  . atorvastatin (LIPITOR) 10 MG tablet Take 1  tablet (10 mg total) by mouth daily at 6 PM.  . cetirizine (ZYRTEC) 10 MG tablet Take by mouth.  [name] CHERRY PO Take 1 capsule by mouth 2 (two) times daily. ** Black Cherry **   . cherry syrup syrup Take by mouth.  . clobetasol ointment (TEMOVATE) 5.57 % Apply 1 application topically daily as needed. Skin irritation  . donepezil (ARICEPT) 10 MG tablet Take 1 tablet daily  . fluocinonide cream (LIDEX) 3.22 % Apply 1 application topically as needed (dry skin).   . fluorouracil (EFUDEX) 5 % cream Apply 1 application topically daily. To top of scalp  . Fluticasone-Salmeterol (ADVAIR DISKUS) 100-50 MCG/DOSE AEPB   . Fluticasone-Salmeterol (ADVAIR DISKUS) 250-50 MCG/DOSE AEPB Inhale 1 puff into the lungs every 12 (twelve) hours as needed (as needed for wheezing). Reported on 09/14/2015  . hydrochlorothiazide (MICROZIDE) 12.5 MG capsule Take 1 capsule (12.5 mg total) by mouth daily.  [name]  losartan (COZAAR) 50 MG tablet Take 1 tablet (50 mg total) by mouth daily.  . Multiple Vitamin (MULTIVITAMIN IRON-FREE) TABS Take 1 tablet by mouth daily.  . Multiple Vitamins-Minerals (CENTRUM ADULTS) TABS Take by mouth.  . nitroGLYCERIN (NITROSTAT) 0.4 MG SL tablet *PLACE 1 TABLET UNDER TONGUE EVERY 5 MINS., UP TO 3 DOSES AS NEEDED FOR CHEST PAIN*  . Omega-3 Fatty Acids (FISH OIL) 1000 MG CAPS Take by mouth.  . Omega-3 Fatty Acids (OMEGA-3 FISH OIL) 300 MG CAPS Take 300 mg by mouth 2 (two) times daily.  . Omega-3 Fatty Acids (OMEGA-3 FISH OIL) 300 MG CAPS Take by mouth.   No current facility-administered medications for this visit. (Other)      REVIEW OF SYSTEMS:    ALLERGIES Allergies  Allergen Reactions  . Lisinopril Other (See Comments)    Cough    PAST MEDICAL HISTORY Past Medical History:  Diagnosis Date  . Bradycardia   . Coronary artery disease   . Gout   . History of atrial fibrillation    Previously on amiodarone  . Hx of CABG   . Hypercholesterolemia   . Hypertension   . Myocardial  infarction ([place])   . Nocturia    Past Surgical History:  Procedure Laterality Date  . CARDIAC CATHETERIZATION  01/04/2010  . CORONARY ARTERY BYPASS GRAFT  02/07/2010   LIMA to LAD, SVG to 2nd DX, SVG to OM  . EYE SURGERY Bilateral    Cataract Extraction  . HEMORRHOID SURGERY     x 2  . HEMORRHOID SURGERY N/A 11/13/2012   Procedure: [place] Hemorrhoidectomy;  Surgeon: Merrie Roof, MD;  Location: [facility];  Service: General;  Laterality: N/A;  . INGUINAL HERNIA REPAIR Right 01/22/2017   Procedure: RIGHT INGUINAL HERNIA REPAIR WITH MESH;  Surgeon: Armandina Gemma, MD;  Location: [facility];  Service: General;  Laterality: Right;  . INSERTION OF MESH Right 01/22/2017   Procedure: INSERTION OF MESH;  Surgeon: Armandina Gemma, MD;  Location: [facility];  Service: General;  Laterality: Right;  . NASAL POLYP EXCISION    . OTHER SURGICAL HISTORY  06/05/2002   Penile Implant    FAMILY HISTORY Family History  Problem Relation Age of Onset  . Alcohol abuse Father   . Colon cancer Mother   . Cancer Mother        colon and bone  . Heart attack Neg Hx   . Hypertension Neg Hx   . Stroke Neg Hx     SOCIAL HISTORY Social History   Tobacco Use  . Smoking status: Former Smoker    Types: Cigarettes    Quit date: 01/07/1961    Years since quitting: 59.1  . Smokeless tobacco: Former Network engineer  . Vaping Use: Never used  Substance Use Topics  . Alcohol use: Yes    Alcohol/week: 4.0 standard drinks    Types: 4 Cans of beer per week  . Drug use: No         OPHTHALMIC EXAM:  Base Eye Exam    Visual Acuity (Snellen - Linear)      Right Left   Dist [place] 20/50 20/60 -2   Dist ph  20/40 20/40       Tonometry (Tonopen, 1:03 PM)      Right Left   Pressure 13 12       Pupils      Pupils Dark Light Shape React APD   Right PERRL 4 3 Round Brisk None  Left PERRL 4 3 Round Brisk None       Visual Fields (Counting fingers)      Left Right    Full Full       Neuro/Psych    Oriented x3: Yes    Mood/Affect: Normal       Dilation    Both eyes: 1.0% Mydriacyl, 2.5% Phenylephrine @ 1:03 PM        Slit Lamp and Fundus Exam    External Exam      Right Left   External Normal Normal       Slit Lamp Exam      Right Left   Lids/Lashes Normal Normal   Conjunctiva/Sclera White and quiet White and quiet   Cornea Clear Clear   Anterior Chamber Deep and quiet Deep and quiet   Iris Round and reactive Round and reactive   Lens Centered posterior chamber intraocular lens Centered posterior chamber intraocular lens   Anterior Vitreous Normal Normal       Fundus Exam      Right Left   Posterior Vitreous Posterior vitreous detachment Posterior vitreous detachment   Disc Normal    C/D Ratio 0.2 0.25   Macula Epiretinal membrane Normal   Vessels Normal Normal   Periphery Normal Normal          IMAGING AND PROCEDURES  Imaging and Procedures for 02/10/20  OCT, Retina - OU - Both Eyes       Right Eye Quality was good. Scan locations included subfoveal. Central Foveal Thickness: 384. Progression has been stable. Findings include epiretinal membrane.   Left Eye Quality was good. Scan locations included subfoveal. Central Foveal Thickness: 318. Progression has been stable. Findings include normal observations.   Notes OD with persistent foveal thickening and epiretinal membrane induced and temporal to the fovea.                ASSESSMENT/PLAN:  Right epiretinal membrane OD with persistent foveal thickening and epiretinal membrane induced and temporal to the fovea.  I have instructed and demonstrated to the patient how to check monocular vision testing at near with reading glasses to compare acuity right eye alone versus left eye alone.  Should the right eyes start to decline to the point of impacting his activities of daily living, discussed potential improvement of this via vitrectomy membrane peel of this epiretinal membrane      ICD-10-CM   1. Right epiretinal  membrane  H35.371 OCT, Retina - OU - Both Eyes  2. 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 Pseudophakia of both eyes  Z96.1   4. Posterior vitreous detachment of right eye  H43.811     1.  Epiretinal membrane with mild impact on foveal architecture.  Patient will monitor in a monocular fashion the effect on visual acuity OD with reading glasses use  2.  Overall no appreciable change in the acuity OD.  3.  Ophthalmic Meds Ordered this visit:  No orders of the defined types were placed in this encounter.      Return in about 6 months (around 08/12/2020) for DILATE OU, OCT.  There are no Patient Instructions on file for this visit.   Explained the diagnoses, plan, and follow up with the patient and they expressed understanding.  Patient expressed understanding of the importance of proper follow up care.   Clent Demark Lyrik Dockstader M.D. Diseases & Surgery of the Retina and Vitreous [facility] [place] 02/10/20  Abbreviations: M myopia (nearsighted); A astigmatism; H hyperopia (farsighted); P presbyopia; Mrx spectacle prescription;  CTL contact lenses; OD right eye; OS left eye; OU both eyes  XT exotropia; ET esotropia; PEK punctate epithelial keratitis; PEE punctate epithelial erosions; DES dry eye syndrome; MGD meibomian gland dysfunction; ATs artificial tears; PFAT's preservative free artificial tears; [place] nuclear sclerotic cataract; PSC posterior subcapsular cataract; ERM epi-retinal membrane; PVD posterior vitreous detachment; RD retinal detachment; DM diabetes mellitus; DR diabetic retinopathy; NPDR non-proliferative diabetic retinopathy; PDR proliferative diabetic retinopathy; CSME clinically significant macular edema; DME diabetic macular edema; dbh dot blot hemorrhages; CWS cotton wool spot; POAG primary open angle glaucoma; C/D cup-to-disc ratio; HVF humphrey visual field; GVF goldmann visual field; OCT optical coherence tomography; IOP intraocular pressure; BRVO Branch  retinal vein occlusion; CRVO central retinal vein occlusion; CRAO central retinal artery occlusion; BRAO branch retinal artery occlusion; RT retinal tear; SB scleral buckle; PPV pars plana vitrectomy; VH Vitreous hemorrhage; PRP panretinal laser photocoagulation; IVK intravitreal kenalog; VMT vitreomacular traction; MH Macular hole;  NVD neovascularization of the disc; NVE neovascularization elsewhere; AREDS age related eye disease study; ARMD age related macular degeneration; POAG primary open angle glaucoma; EBMD epithelial/anterior basement membrane dystrophy; ACIOL anterior chamber intraocular lens; IOL intraocular lens; PCIOL posterior chamber intraocular lens; Phaco/IOL phacoemulsification with intraocular lens placement; [place] photorefractive keratectomy; LASIK laser assisted in situ keratomileusis; HTN hypertension; DM diabetes mellitus; COPD chronic obstructive pulmonary disease

## 2020-02-26 ENCOUNTER — Telehealth: Payer: Self-pay | Admitting: Cardiology

## 2020-02-26 NOTE — Telephone Encounter (Signed)
Spoke to patient's wife.Advised she had labs 09/11/19.She will not need labs at this visit.

## 2020-02-26 NOTE — Telephone Encounter (Signed)
Zachary Hall is calling wanting to know if she and her husband are needing labs for their upcoming appointment on 03/22/20. She states if she is unable to answer when calling back in regards to this please leave a detailed message as well as put paperwork in the mail today for the labs.

## 2020-03-18 NOTE — Progress Notes (Signed)
Cardiology Office Note   Date:  03/22/2020   ID:  Zachary Hall, DOB 04-01-1935, MRN [id number]  PCP:  None Cardiologist: Jonette Wassel [name] MD  Chief Complaint  Patient presents with  . Coronary Artery Disease      History of Present Illness: Zachary Hall is a 85 y.o. male is seen for follow up CAD.  He has a history of known ischemic heart disease. He underwent coronary artery bypass graft surgery on 02/07/10 by Dr. Cyndia Hall. Last Myoview in February 2015 was normal with EF 66%.   His wife is concerned that he has more cognitive decline. Forgets things. Loses his hearing aids. Marland KitchenHe is followed  by neurology. MRI showed chronic small vessel ischemic changes but no acute findings.   On follow up he denies any chest pain, dyspnea, palpitations. Does some yard work for activity. Splits wood with a wood splitter. No real change in his health.   Past Medical History:  Diagnosis Date  . Bradycardia   . Coronary artery disease   . Gout   . History of atrial fibrillation    Previously on amiodarone  . Hx of CABG   . Hypercholesterolemia   . Hypertension   . Myocardial infarction ([place])   . Nocturia     Past Surgical History:  Procedure Laterality Date  . CARDIAC CATHETERIZATION  01/04/2010  . CORONARY ARTERY BYPASS GRAFT  02/07/2010   LIMA to LAD, SVG to 2nd DX, SVG to OM  . EYE SURGERY Bilateral    Cataract Extraction  . HEMORRHOID SURGERY     x 2  . HEMORRHOID SURGERY N/A 11/13/2012   Procedure: [place] Hemorrhoidectomy;  Surgeon: Zachary Roof, MD;  Location: [facility];  Service: General;  Laterality: N/A;  . INGUINAL HERNIA REPAIR Right 01/22/2017   Procedure: RIGHT INGUINAL HERNIA REPAIR WITH MESH;  Surgeon: Zachary Gemma, MD;  Location: [facility];  Service: General;  Laterality: Right;  . INSERTION OF MESH Right 01/22/2017   Procedure: INSERTION OF MESH;  Surgeon: Zachary Gemma, MD;  Location: [facility];  Service: General;  Laterality: Right;  . NASAL POLYP EXCISION    . OTHER  SURGICAL HISTORY  06/05/2002   Penile Implant     Current Outpatient Medications  Medication Sig Dispense Refill  . amLODipine (NORVASC) 10 MG tablet Take 1 tablet (10 mg total) by mouth daily. 90 tablet 3  . aspirin 81 MG tablet Take 81 mg by mouth every other day. At night    . atorvastatin (LIPITOR) 10 MG tablet Take 1 tablet (10 mg total) by mouth daily at 6 PM. 90 tablet 3  . cetirizine (ZYRTEC) 10 MG tablet Take by mouth.    [name] CHERRY PO Take 1 capsule by mouth 2 (two) times daily. ** Black Cherry **     . cherry syrup syrup Take by mouth.    . clobetasol ointment (TEMOVATE) 9.21 % Apply 1 application topically daily as needed. Skin irritation    . fluocinonide cream (LIDEX) 1.94 % Apply 1 application topically as needed (dry skin).     . fluorouracil (EFUDEX) 5 % cream Apply 1 application topically daily. To top of scalp    . Fluticasone-Salmeterol (ADVAIR DISKUS) 100-50 MCG/DOSE AEPB     . Fluticasone-Salmeterol (ADVAIR DISKUS) 250-50 MCG/DOSE AEPB Inhale 1 puff into the lungs every 12 (twelve) hours as needed (as needed for wheezing). Reported on 09/14/2015    . hydrochlorothiazide (MICROZIDE) 12.5 MG capsule Take 1 capsule (12.5 mg  total) by mouth daily. 90 capsule 3  . losartan (COZAAR) 50 MG tablet Take 1 tablet (50 mg total) by mouth daily. 90 tablet 3  . Multiple Vitamin (MULTIVITAMIN IRON-FREE) TABS Take 1 tablet by mouth daily.    . Multiple Vitamins-Minerals (CENTRUM ADULTS) TABS Take by mouth.    . nitroGLYCERIN (NITROSTAT) 0.4 MG SL tablet *PLACE 1 TABLET UNDER TONGUE EVERY 5 MINS., UP TO 3 DOSES AS NEEDED FOR CHEST PAIN* 25 tablet 3   No current facility-administered medications for this visit.    Allergies:   Lisinopril    Social History:  The patient  reports that he quit smoking about 59 years ago. His smoking use included cigarettes. He has quit using smokeless tobacco. He reports current alcohol use of about 4.0 standard drinks of alcohol per week. He reports that  he does not use drugs.   Family History:  The patient's family history includes Alcohol abuse in his father; Cancer in his mother; Colon cancer in his mother.    ROS:  Please see the history of present illness.   Otherwise, review of systems are positive for none.   All other systems are reviewed and negative.    PHYSICAL EXAM: VS:  BP 120/60   Pulse (!) 49   Ht 5\' 11"  (1.803 m)   Wt 185 lb 6.4 oz (84.1 kg)   SpO2 99%   BMI 25.86 kg/m  , BMI Body mass index is 25.86 kg/m. GENERAL:  Well appearing elderly WM in NAD HEENT:  PERRL, EOMI, sclera are clear. Oropharynx is clear. NECK:  No jugular venous distention, carotid upstroke brisk and symmetric, no bruits, no thyromegaly or adenopathy LUNGS:  Clear to auscultation bilaterally CHEST:  Unremarkable HEART:  RRR,  PMI not displaced or sustained,S1 and S2 within normal limits, no S3, no S4: no clicks, no rubs, no murmurs ABD:  Soft, nontender. BS +, no masses or bruits. No hepatomegaly, no splenomegaly EXT:  2 + pulses throughout, no edema, no cyanosis no clubbing SKIN:  Warm and dry.  No rashes NEURO:  Alert and oriented x 3. Cranial nerves II through XII intact. PSYCH:  Cognitively intact  EKG:  EKG  Is  ordered today. Sinus brady rate 49. RBBB. I have personally reviewed and interpreted this study..    Recent Labs: 09/11/2019: ALT 15; BUN 15; Creatinine, Ser 1.27; Potassium 4.2; Sodium 141    Lipid Panel    Component Value Date/Time   CHOL 121 09/11/2019 0930   TRIG 134 09/11/2019 0930   HDL 45 09/11/2019 0930   CHOLHDL 2.7 09/11/2019 0930   CHOLHDL 2.7 10/16/2016 1443   VLDL 32 (H) 10/16/2016 1443   LDLCALC 53 09/11/2019 0930      Wt Readings from Last 3 Encounters:  03/22/20 185 lb 6.4 oz (84.1 kg)  09/22/19 185 lb 3.2 oz (84 kg)  03/24/19 182 lb (82.6 kg)     ASSESSMENT AND PLAN:  1. Ischemic heart disease status post CABG 2011. Patient is asymptomatic. Last Myoview in Feb. 2015 was normal. Continue current  medical therapy.  2. RBBB with sinus bradycardia. Chronic- asymptomatic. Will monitor. On no rate slowing therapy 3. Hypercholesterolemia. Excellent control on statin.  4. Remote history of paroxysmal atrial fibrillation- no recurrence. 5. Benign hypertensive heart disease without heart failure. BP is under good control. Continue current meds. 6. Memory disorder followed by Neurology. On Aricept.  Current medicines are reviewed at length with the patient today.  The patient does not have  concerns regarding medicines.  The following changes have been made:  no change  Labs/ tests ordered today include:   Orders Placed This Encounter  Procedures  . EKG 12-Lead    Disposition: Continue current medication.  Follow up 6 months   Signed, Kyra Laffey [name] MD, [facility]   03/22/2020 3:10 PM    [place]

## 2020-03-22 ENCOUNTER — Ambulatory Visit: Payer: Medicare Other | Admitting: Cardiology

## 2020-03-22 ENCOUNTER — Encounter: Payer: Self-pay | Admitting: Cardiology

## 2020-03-22 ENCOUNTER — Other Ambulatory Visit: Payer: Self-pay

## 2020-03-22 VITALS — BP 120/60 | HR 49 | Ht 71.0 in | Wt 185.4 lb

## 2020-03-22 DIAGNOSIS — I452 Bifascicular block: Secondary | ICD-10-CM | POA: Diagnosis not present

## 2020-03-22 DIAGNOSIS — E7800 Pure hypercholesterolemia, unspecified: Secondary | ICD-10-CM

## 2020-03-22 DIAGNOSIS — I10 Essential (primary) hypertension: Secondary | ICD-10-CM

## 2020-03-22 DIAGNOSIS — I4510 Unspecified right bundle-branch block: Secondary | ICD-10-CM | POA: Diagnosis not present

## 2020-03-22 DIAGNOSIS — I2510 Atherosclerotic heart disease of native coronary artery without angina pectoris: Secondary | ICD-10-CM

## 2020-03-22 MED ORDER — NITROGLYCERIN 0.4 MG SL SUBL
SUBLINGUAL_TABLET | SUBLINGUAL | 3 refills | Status: DC
Start: 2020-03-22 — End: 2020-07-19

## 2020-03-22 NOTE — Addendum Note (Signed)
Addended by: Kathyrn Lass on: 03/22/2020 03:17 PM   Modules accepted: Orders

## 2020-03-23 DIAGNOSIS — H524 Presbyopia: Secondary | ICD-10-CM | POA: Diagnosis not present

## 2020-03-23 DIAGNOSIS — H35371 Puckering of macula, right eye: Secondary | ICD-10-CM | POA: Diagnosis not present

## 2020-03-23 DIAGNOSIS — H5213 Myopia, bilateral: Secondary | ICD-10-CM | POA: Diagnosis not present

## 2020-03-23 DIAGNOSIS — Z961 Presence of intraocular lens: Secondary | ICD-10-CM | POA: Diagnosis not present

## 2020-03-23 DIAGNOSIS — H52203 Unspecified astigmatism, bilateral: Secondary | ICD-10-CM | POA: Diagnosis not present

## 2020-04-27 DIAGNOSIS — L820 Inflamed seborrheic keratosis: Secondary | ICD-10-CM | POA: Diagnosis not present

## 2020-04-27 DIAGNOSIS — Z85828 Personal history of other malignant neoplasm of skin: Secondary | ICD-10-CM | POA: Diagnosis not present

## 2020-04-27 DIAGNOSIS — L821 Other seborrheic keratosis: Secondary | ICD-10-CM | POA: Diagnosis not present

## 2020-05-04 ENCOUNTER — Other Ambulatory Visit: Payer: Self-pay | Admitting: Cardiology

## 2020-05-19 ENCOUNTER — Other Ambulatory Visit: Payer: Self-pay | Admitting: Cardiology

## 2020-07-18 ENCOUNTER — Other Ambulatory Visit: Payer: Self-pay | Admitting: Cardiology

## 2020-07-27 DIAGNOSIS — L821 Other seborrheic keratosis: Secondary | ICD-10-CM | POA: Diagnosis not present

## 2020-07-27 DIAGNOSIS — Z85828 Personal history of other malignant neoplasm of skin: Secondary | ICD-10-CM | POA: Diagnosis not present

## 2020-07-27 DIAGNOSIS — L570 Actinic keratosis: Secondary | ICD-10-CM | POA: Diagnosis not present

## 2020-07-27 DIAGNOSIS — L812 Freckles: Secondary | ICD-10-CM | POA: Diagnosis not present

## 2020-07-27 DIAGNOSIS — D225 Melanocytic nevi of trunk: Secondary | ICD-10-CM | POA: Diagnosis not present

## 2020-08-11 DIAGNOSIS — Z86010 Personal history of colonic polyps: Secondary | ICD-10-CM | POA: Diagnosis not present

## 2020-08-11 DIAGNOSIS — K5901 Slow transit constipation: Secondary | ICD-10-CM | POA: Diagnosis not present

## 2020-08-16 ENCOUNTER — Encounter (INDEPENDENT_AMBULATORY_CARE_PROVIDER_SITE_OTHER): Payer: Medicare Other | Admitting: Ophthalmology

## 2020-08-18 ENCOUNTER — Ambulatory Visit (INDEPENDENT_AMBULATORY_CARE_PROVIDER_SITE_OTHER): Payer: Medicare Other | Admitting: Ophthalmology

## 2020-08-18 ENCOUNTER — Encounter (INDEPENDENT_AMBULATORY_CARE_PROVIDER_SITE_OTHER): Payer: Self-pay | Admitting: Ophthalmology

## 2020-08-18 ENCOUNTER — Other Ambulatory Visit: Payer: Self-pay

## 2020-08-18 DIAGNOSIS — Z01812 Encounter for preprocedural laboratory examination: Secondary | ICD-10-CM | POA: Diagnosis not present

## 2020-08-18 DIAGNOSIS — H35371 Puckering of macula, right eye: Secondary | ICD-10-CM | POA: Diagnosis not present

## 2020-08-18 NOTE — Progress Notes (Signed)
08/18/2020     CHIEF COMPLAINT Patient presents for Retina Follow Up (6 Month f\u OU. OCT/Pt states NVA and DVA is better some times than others. Denies any new FOL and floaters.)   HISTORY OF PRESENT ILLNESS: Zachary Hall is a 86 y.o. male who presents to the clinic today for:   HPI    Retina Follow Up    Diagnosis: ERM.  In right eye.  Severity is moderate.  Duration of 6 months.  Since onset it is stable.  I, the attending physician,  performed the HPI with the patient and updated documentation appropriately. Additional comments: 6 Month f\u OU. OCT Pt states NVA and DVA is better some times than others. Denies any new FOL and floaters.       Last edited by Tilda Franco on 08/18/2020  1:47 PM. (History)      Referring physician: No referring provider defined for this encounter.  HISTORICAL INFORMATION:   Selected notes from the MEDICAL RECORD NUMBER    Lab Results  Component Value Date   HGBA1C  02/03/2010    5.6 (NOTE)                                                                       According to the ADA Clinical Practice Recommendations for 2011, when HbA1c is used as a screening test:   >=6.5%   Diagnostic of Diabetes Mellitus           (if abnormal result  is confirmed)  5.7-6.4%   Increased risk of developing Diabetes Mellitus  References:Diagnosis and Classification of Diabetes Mellitus,Diabetes Care,2011,34(Suppl 1):S62-S69 and Standards of Medical Care in         Diabetes - 2011,Diabetes [id number]  (Suppl 1):S11-S61.     CURRENT MEDICATIONS: No current outpatient medications on file. (Ophthalmic Drugs)   No current facility-administered medications for this visit. (Ophthalmic Drugs)   Current Outpatient Medications (Other)  Medication Sig  . amLODipine (NORVASC) 10 MG tablet TAKE 1 TABLET BY MOUTH EVERY DAY  . aspirin 81 MG tablet Take 81 mg by mouth every other day. At night  . atorvastatin (LIPITOR) 10 MG tablet TAKE 1 TABLET (10 MG TOTAL)  BY MOUTH DAILY AT 6 PM.  . cetirizine (ZYRTEC) 10 MG tablet Take by mouth.  [name] CHERRY PO Take 1 capsule by mouth 2 (two) times daily. ** Black Cherry **   . cherry syrup syrup Take by mouth.  . clobetasol ointment (TEMOVATE) [id number] % Apply 1 application topically daily as needed. Skin irritation  . fluocinonide cream (LIDEX) [id number] % Apply 1 application topically as needed (dry skin).   . fluorouracil (EFUDEX) 5 % cream Apply 1 application topically daily. To top of scalp  . Fluticasone-Salmeterol (ADVAIR DISKUS) 100-50 MCG/DOSE AEPB   . Fluticasone-Salmeterol (ADVAIR DISKUS) 250-50 MCG/DOSE AEPB Inhale 1 puff into the lungs every 12 (twelve) hours as needed (as needed for wheezing). Reported on 09/14/2015  . hydrochlorothiazide (MICROZIDE) 12.5 MG capsule TAKE 1 CAPSULE BY MOUTH EVERY DAY  . losartan (COZAAR) 50 MG tablet TAKE 1 TABLET BY MOUTH EVERY DAY  . Multiple Vitamin (MULTIVITAMIN IRON-FREE) TABS Take 1 tablet by mouth daily.  . Multiple Vitamins-Minerals (CENTRUM ADULTS) TABS Take by mouth.  [name]  nitroGLYCERIN (NITROSTAT) 0.4 MG SL tablet *PLACE 1 TABLET UNDER TONGUE EVERY 5 MINS., UP TO 3 DOSES AS NEEDED FOR CHEST PAIN*   No current facility-administered medications for this visit. (Other)      REVIEW OF SYSTEMS:    ALLERGIES Allergies  Allergen Reactions  . Lisinopril Other (See Comments)    Cough    PAST MEDICAL HISTORY Past Medical History:  Diagnosis Date  . Bradycardia   . Coronary artery disease   . Gout   . History of atrial fibrillation    Previously on amiodarone  . Hx of CABG   . Hypercholesterolemia   . Hypertension   . Myocardial infarction ([place])   . Nocturia    Past Surgical History:  Procedure Laterality Date  . CARDIAC CATHETERIZATION  01/04/2010  . CORONARY ARTERY BYPASS GRAFT  02/07/2010   LIMA to LAD, SVG to 2nd DX, SVG to OM  . EYE SURGERY Bilateral    Cataract Extraction  . HEMORRHOID SURGERY     x 2  . HEMORRHOID SURGERY N/A 11/13/2012    Procedure: [place] Hemorrhoidectomy;  Surgeon: Merrie Roof, MD;  Location: [facility];  Service: General;  Laterality: N/A;  . INGUINAL HERNIA REPAIR Right 01/22/2017   Procedure: RIGHT INGUINAL HERNIA REPAIR WITH MESH;  Surgeon: Armandina Gemma, MD;  Location: [facility];  Service: General;  Laterality: Right;  . INSERTION OF MESH Right 01/22/2017   Procedure: INSERTION OF MESH;  Surgeon: Armandina Gemma, MD;  Location: [facility];  Service: General;  Laterality: Right;  . NASAL POLYP EXCISION    . OTHER SURGICAL HISTORY  06/05/2002   Penile Implant    FAMILY HISTORY Family History  Problem Relation Age of Onset  . Alcohol abuse Father   . Colon cancer Mother   . Cancer Mother        colon and bone  . Heart attack Neg Hx   . Hypertension Neg Hx   . Stroke Neg Hx     SOCIAL HISTORY Social History   Tobacco Use  . Smoking status: Former Smoker    Types: Cigarettes    Quit date: 01/07/1961    Years since quitting: 59.6  . Smokeless tobacco: Former Network engineer  . Vaping Use: Never used  Substance Use Topics  . Alcohol use: Yes    Alcohol/week: 4.0 standard drinks    Types: 4 Cans of beer per week  . Drug use: No         OPHTHALMIC EXAM: Base Eye Exam    Visual Acuity (Snellen - Linear)      Right Left   Dist [place] 20/50 20/100 -1   Dist ph [place] 20/30 -2 20/50       Tonometry (Tonopen, 1:53 PM)      Right Left   Pressure 17 16       Pupils      Pupils Dark Light Shape React APD   Right PERRL 5 4 Round Brisk None   Left PERRL 5 4 Round Brisk None       Visual Fields (Counting fingers)      Left Right     Full   Restrictions Partial outer superior temporal deficiency        Neuro/Psych    Oriented x3: Yes   Mood/Affect: Normal       Dilation    Both eyes: 1.0% Mydriacyl, 2.5% Phenylephrine @ 1:53 PM        Slit Lamp and Fundus  Exam    External Exam      Right Left   External Normal Normal       Slit Lamp Exam      Right Left   Lids/Lashes Normal Normal    Conjunctiva/Sclera White and quiet White and quiet   Cornea Clear Clear   Anterior Chamber Deep and quiet Deep and quiet   Iris Round and reactive Round and reactive   Lens Centered posterior chamber intraocular lens, PC open Centered posterior chamber intraocular lens, PC open   Anterior Vitreous Normal Normal       Fundus Exam      Right Left   Posterior Vitreous Posterior vitreous detachment Posterior vitreous detachment   Disc Normal Normal   C/D Ratio 0.1 0.25   Macula Epiretinal membrane, moderate topo distortion Normal   Vessels Normal Normal   Periphery  , Pigmentation, Reticular degeneration  , Pigmentation, Reticular degeneration          IMAGING AND PROCEDURES  Imaging and Procedures for 08/18/20  OCT, Retina - OU - Both Eyes       Right Eye Quality was good. Scan locations included subfoveal. Central Foveal Thickness: 386. Progression has worsened. Findings include abnormal foveal contour, subretinal hyper-reflective material.   Left Eye Quality was good. Scan locations included subfoveal. Central Foveal Thickness: 323. Progression has been stable. Findings include normal foveal contour.   Notes Outer retinal change at the EZ layer and IS -OS layer, of the right eye secondary to epiretinal membrane.                ASSESSMENT/PLAN:  Right epiretinal membrane The nature of macular pucker (epiretinal membrane ERM) was discussed with the patient as well as threshold criteria for vitrectomy surgery. I explained that in rare cases another surgery is needed to actually remove a second wrinkle should it regrow.  Most often, the epiretinal membrane and underlying wrinkled internal limiting membrane are removed with the first surgery, to accomplish the goals.   If the operative eye is [place]ak[place] (natural lens still present), cataract surgery is often recommended prior to Vitrectomy. This will enable the retina surgeon to have the best view during surgery and the patient  to obtain optimal results in the future. Treatment options were discussed. Likely accounts for acuity   No surgical intervention warranted for the right eye at this time for this condition   ICD-10-CM   1. Right epiretinal membrane  H35.371 OCT, Retina - OU - Both Eyes    1.  Findings reviewed with the patient and family, epiretinal membrane with minor change with little to no impact on acuity, and  with pinhole acuity acuity of 20/30.  Refraction may help improve his 20/50 in the right eye down to 20/30  2.  Patient to follow-up with Dr. Gershon Crane as scheduled or sooner should he want to enjoy improved acuity  3.  Ophthalmic Meds Ordered this visit:  No orders of the defined types were placed in this encounter.      Return in about 1 year (around 08/18/2021) for DILATE OU, OCT.  There are no Patient Instructions on file for this visit.   Explained the diagnoses, plan, and follow up with the patient and they expressed understanding.  Patient expressed understanding of the importance of proper follow up care.   Clent Demark Carolle Ishii M.D. Diseases & Surgery of the Retina and Vitreous [facility] [place] 08/18/20     Abbreviations: M myopia (nearsighted); A astigmatism;  H hyperopia (farsighted); P presbyopia; Mrx spectacle prescription;  CTL contact lenses; OD right eye; OS left eye; OU both eyes  XT exotropia; ET esotropia; PEK punctate epithelial keratitis; PEE punctate epithelial erosions; DES dry eye syndrome; MGD meibomian gland dysfunction; ATs artificial tears; PFAT's preservative free artificial tears; [place] nuclear sclerotic cataract; PSC posterior subcapsular cataract; ERM epi-retinal membrane; PVD posterior vitreous detachment; RD retinal detachment; DM diabetes mellitus; DR diabetic retinopathy; NPDR non-proliferative diabetic retinopathy; PDR proliferative diabetic retinopathy; CSME clinically significant macular edema; DME diabetic macular edema; dbh dot blot hemorrhages;  CWS cotton wool spot; POAG primary open angle glaucoma; C/D cup-to-disc ratio; HVF humphrey visual field; GVF goldmann visual field; OCT optical coherence tomography; IOP intraocular pressure; BRVO Branch retinal vein occlusion; CRVO central retinal vein occlusion; CRAO central retinal artery occlusion; BRAO branch retinal artery occlusion; RT retinal tear; SB scleral buckle; PPV pars plana vitrectomy; VH Vitreous hemorrhage; PRP panretinal laser photocoagulation; IVK intravitreal kenalog; VMT vitreomacular traction; MH Macular hole;  NVD neovascularization of the disc; NVE neovascularization elsewhere; AREDS age related eye disease study; ARMD age related macular degeneration; POAG primary open angle glaucoma; EBMD epithelial/anterior basement membrane dystrophy; ACIOL anterior chamber intraocular lens; IOL intraocular lens; PCIOL posterior chamber intraocular lens; Phaco/IOL phacoemulsification with intraocular lens placement; [place] photorefractive keratectomy; LASIK laser assisted in situ keratomileusis; HTN hypertension; DM diabetes mellitus; COPD chronic obstructive pulmonary disease

## 2020-08-18 NOTE — Assessment & Plan Note (Signed)
The nature of macular pucker (epiretinal membrane ERM) was discussed with the patient as well as threshold criteria for vitrectomy surgery. I explained that in rare cases another surgery is needed to actually remove a second wrinkle should it regrow.  Most often, the epiretinal membrane and underlying wrinkled internal limiting membrane are removed with the first surgery, to accomplish the goals.   If the operative eye is Phakic (natural lens still present), cataract surgery is often recommended prior to Vitrectomy. This will enable the retina surgeon to have the best view during surgery and the patient to obtain optimal results in the future. Treatment options were discussed. Likely accounts for acuity

## 2020-08-23 DIAGNOSIS — D122 Benign neoplasm of ascending colon: Secondary | ICD-10-CM | POA: Diagnosis not present

## 2020-08-23 DIAGNOSIS — Z86010 Personal history of colonic polyps: Secondary | ICD-10-CM | POA: Diagnosis not present

## 2020-08-25 DIAGNOSIS — D122 Benign neoplasm of ascending colon: Secondary | ICD-10-CM | POA: Diagnosis not present

## 2020-09-08 ENCOUNTER — Other Ambulatory Visit: Payer: Self-pay | Admitting: Cardiology

## 2020-10-01 DIAGNOSIS — I452 Bifascicular block: Secondary | ICD-10-CM | POA: Diagnosis not present

## 2020-10-01 DIAGNOSIS — I2510 Atherosclerotic heart disease of native coronary artery without angina pectoris: Secondary | ICD-10-CM | POA: Diagnosis not present

## 2020-10-01 DIAGNOSIS — E7800 Pure hypercholesterolemia, unspecified: Secondary | ICD-10-CM | POA: Diagnosis not present

## 2020-10-01 DIAGNOSIS — I10 Essential (primary) hypertension: Secondary | ICD-10-CM | POA: Diagnosis not present

## 2020-10-01 DIAGNOSIS — I4510 Unspecified right bundle-branch block: Secondary | ICD-10-CM | POA: Diagnosis not present

## 2020-10-01 LAB — LIPID PANEL
Chol/HDL Ratio: 2.8 ratio (ref 0.0–5.0)
Cholesterol, Total: 122 mg/dL (ref 100–199)
HDL: 44 mg/dL (ref >39)
LDL Chol Calc (NIH): 62 mg/dL (ref 0–99)
Triglycerides: 83 mg/dL (ref 0–149)
VLDL Cholesterol Cal: 16 mg/dL (ref 5–40)

## 2020-10-01 LAB — CBC WITH DIFFERENTIAL/PLATELET
Basophils Absolute: 0 10*3/uL (ref 0.0–0.2)
Basos: 0 %
EOS (ABSOLUTE): 0.1 10*3/uL (ref 0.0–0.4)
Eos: 2 %
Hematocrit: 38.4 % (ref 37.5–51.0)
Hemoglobin: 13.1 g/dL (ref 13.0–17.7)
Immature Grans (Abs): 0 10*3/uL (ref 0.0–0.1)
Immature Granulocytes: 0 %
Lymphocytes Absolute: 2 10*3/uL (ref 0.7–3.1)
Lymphs: 27 %
MCH: 33.4 pg — ABNORMAL HIGH (ref 26.6–33.0)
MCHC: 34.1 g/dL (ref 31.5–35.7)
MCV: 98 fL — ABNORMAL HIGH (ref 79–97)
Monocytes Absolute: 0.6 10*3/uL (ref 0.1–0.9)
Monocytes: 8 %
Neutrophils Absolute: 4.7 10*3/uL (ref 1.4–7.0)
Neutrophils: 63 %
Platelets: 182 10*3/uL (ref 150–450)
RBC: 3.92 x10E6/uL — ABNORMAL LOW (ref 4.14–5.80)
RDW: 12.5 % (ref 11.6–15.4)
WBC: 7.4 10*3/uL (ref 3.4–10.8)

## 2020-10-01 LAB — HEPATIC FUNCTION PANEL
ALT: 16 IU/L (ref 0–44)
AST: 13 IU/L (ref 0–40)
Albumin: 4.3 g/dL (ref 3.6–4.6)
Alkaline Phosphatase: 61 IU/L (ref 44–121)
Bilirubin Total: 0.5 mg/dL (ref 0.0–1.2)
Bilirubin, Direct: 0.17 mg/dL (ref 0.00–0.40)
Total Protein: 6 g/dL (ref 6.0–8.5)

## 2020-10-01 LAB — BASIC METABOLIC PANEL
BUN/Creatinine Ratio: 15 (ref 10–24)
BUN: 17 mg/dL (ref 8–27)
CO2: 24 mmol/L (ref 20–29)
Calcium: 8.9 mg/dL (ref 8.6–10.2)
Chloride: 105 mmol/L (ref 96–106)
Creatinine, Ser: 1.14 mg/dL (ref 0.76–1.27)
Glucose: 97 mg/dL (ref 65–99)
Potassium: 4.2 mmol/L (ref 3.5–5.2)
Sodium: 142 mmol/L (ref 134–144)
eGFR: 63 mL/min/{1.73_m2} (ref >59)

## 2020-10-04 NOTE — Progress Notes (Signed)
Cardiology Office Note   Date:  10/08/2020   ID:  DEIDRICK RAINEY, DOB 10/26/1934, MRN [id number]  PCP:  None Cardiologist: Julian Medina [name] MD  Chief Complaint  Patient presents with  . Coronary Artery Disease      History of Present Illness: Zachary Hall is a 86 y.o. male is seen for follow up CAD.  He has a history of known ischemic heart disease. He underwent coronary artery bypass graft surgery on 02/07/10 by Dr. Cyndia Bent. Last Myoview in February 2015 was normal with EF 66%.   His wife is concerned that he has more cognitive decline. Forgets things. Loses his hearing aids. He last saw neurology 3 years ago. MRI showed chronic small vessel ischemic changes but no acute findings.   On follow up he denies any chest pain, dyspnea, palpitations. Does some yard work for activity. Not really active otherwise. Wife reports he snacks on sweets.   Past Medical History:  Diagnosis Date  . Bradycardia   . Coronary artery disease   . Gout   . History of atrial fibrillation    Previously on amiodarone  . Hx of CABG   . Hypercholesterolemia   . Hypertension   . Myocardial infarction ([place])   . Nocturia     Past Surgical History:  Procedure Laterality Date  . CARDIAC CATHETERIZATION  01/04/2010  . CORONARY ARTERY BYPASS GRAFT  02/07/2010   LIMA to LAD, SVG to 2nd DX, SVG to OM  . EYE SURGERY Bilateral    Cataract Extraction  . HEMORRHOID SURGERY     x 2  . HEMORRHOID SURGERY N/A 11/13/2012   Procedure: [place] Hemorrhoidectomy;  Surgeon: Merrie Roof, MD;  Location: [facility];  Service: General;  Laterality: N/A;  . INGUINAL HERNIA REPAIR Right 01/22/2017   Procedure: RIGHT INGUINAL HERNIA REPAIR WITH MESH;  Surgeon: Armandina Gemma, MD;  Location: [facility];  Service: General;  Laterality: Right;  . INSERTION OF MESH Right 01/22/2017   Procedure: INSERTION OF MESH;  Surgeon: Armandina Gemma, MD;  Location: [facility];  Service: General;  Laterality: Right;  . NASAL POLYP EXCISION    . OTHER  SURGICAL HISTORY  06/05/2002   Penile Implant     Current Outpatient Medications  Medication Sig Dispense Refill  . amLODipine (NORVASC) 10 MG tablet TAKE 1 TABLET BY MOUTH EVERY DAY 90 tablet 1  . aspirin 81 MG tablet Take 81 mg by mouth every other day. At night    . atorvastatin (LIPITOR) 10 MG tablet TAKE 1 TABLET (10 MG TOTAL) BY MOUTH DAILY AT 6 PM. 90 tablet 3  . cetirizine (ZYRTEC) 10 MG tablet Take by mouth.    [name] CHERRY PO Take 1 capsule by mouth 2 (two) times daily. ** Black Cherry **    . cherry syrup syrup Take by mouth.    . clobetasol ointment (TEMOVATE) 3.81 % Apply 1 application topically daily as needed. Skin irritation    . fluocinonide cream (LIDEX) 8.29 % Apply 1 application topically as needed (dry skin).     . fluorouracil (EFUDEX) 5 % cream Apply 1 application topically daily. To top of scalp    . Fluticasone-Salmeterol (ADVAIR DISKUS) 250-50 MCG/DOSE AEPB Inhale 1 puff into the lungs every 12 (twelve) hours as needed (as needed for wheezing). Reported on 09/14/2015    . Fluticasone-Salmeterol (ADVAIR) 100-50 MCG/DOSE AEPB     . hydrochlorothiazide (MICROZIDE) 12.5 MG capsule TAKE 1 CAPSULE BY MOUTH EVERY DAY 90 capsule 1  .  losartan (COZAAR) 50 MG tablet TAKE 1 TABLET BY MOUTH EVERY DAY 90 tablet 1  . Multiple Vitamin (MULTIVITAMIN IRON-FREE) TABS Take 1 tablet by mouth daily.    . Multiple Vitamins-Minerals (CENTRUM ADULTS) TABS Take by mouth.    . nitroGLYCERIN (NITROSTAT) 0.4 MG SL tablet *PLACE 1 TABLET UNDER TONGUE EVERY 5 MINS., UP TO 3 DOSES AS NEEDED FOR CHEST PAIN* 75 tablet 1   No current facility-administered medications for this visit.    Allergies:   Lisinopril    Social History:  The patient  reports that he quit smoking about 59 years ago. His smoking use included cigarettes. He has quit using smokeless tobacco. He reports current alcohol use of about 4.0 standard drinks of alcohol per week. He reports that he does not use drugs.   Family History:   The patient's family history includes Alcohol abuse in his father; Cancer in his mother; Colon cancer in his mother.    ROS:  Please see the history of present illness.   Otherwise, review of systems are positive for none.   All other systems are reviewed and negative.    PHYSICAL EXAM: VS:  BP 124/70   Pulse (!) 49   Ht 5\' 10"  (1.778 m)   Wt 179 lb (81.2 kg)   SpO2 97%   BMI 25.68 kg/m  , BMI Body mass index is 25.68 kg/m. GENERAL:  Well appearing elderly WM in NAD HEENT:  PERRL, EOMI, sclera are clear. Oropharynx is clear. NECK:  No jugular venous distention, carotid upstroke brisk and symmetric, no bruits, no thyromegaly or adenopathy LUNGS:  Clear to auscultation bilaterally CHEST:  Unremarkable HEART:  RRR,  PMI not displaced or sustained,S1 and S2 within normal limits, no S3, no S4: no clicks, no rubs, no murmurs ABD:  Soft, nontender. BS +, no masses or bruits. No hepatomegaly, no splenomegaly EXT:  2 + pulses throughout, no edema, no cyanosis no clubbing SKIN:  Warm and dry.  No rashes NEURO:  Alert and oriented x 3. Cranial nerves II through XII intact. PSYCH:  Cognitively intact  EKG:  EKG  Is  Not ordered today.    Recent Labs: 10/01/2020: ALT 16; BUN 17; Creatinine, Ser 1.14; Hemoglobin 13.1; Platelets 182; Potassium 4.2; Sodium 142    Lipid Panel    Component Value Date/Time   CHOL 122 10/01/2020 0921   TRIG 83 10/01/2020 0921   HDL 44 10/01/2020 0921   CHOLHDL 2.8 10/01/2020 0921   CHOLHDL 2.7 10/16/2016 1443   VLDL 32 (H) 10/16/2016 1443   LDLCALC 62 10/01/2020 0921      Wt Readings from Last 3 Encounters:  10/08/20 179 lb (81.2 kg)  03/22/20 185 lb 6.4 oz (84.1 kg)  09/22/19 185 lb 3.2 oz (84 kg)     ASSESSMENT AND PLAN:  1. Ischemic heart disease status post CABG 2011. Patient is asymptomatic. Last Myoview in Feb. 2015 was normal. Continue current medical therapy.  2. RBBB with sinus bradycardia. Chronic- asymptomatic. Will monitor. On no rate  slowing therapy 3. Hypercholesterolemia. Excellent control on statin.  4. Remote history of paroxysmal atrial fibrillation- no recurrence. 5. Benign hypertensive heart disease without heart failure. BP is under good control. Continue current meds. 6. Memory disorder followed by Neurology. Encourage follow up with Neuro.  Current medicines are reviewed at length with the patient today.  The patient does not have concerns regarding medicines.  The following changes have been made:  no change  Labs/ tests ordered today  include:   No orders of the defined types were placed in this encounter.   Disposition: Continue current medication.  Follow up 6 months   Signed, Pantera Winterrowd [name] MD, [facility]   10/08/2020 11:32 AM    [place]

## 2020-10-08 ENCOUNTER — Other Ambulatory Visit: Payer: Self-pay

## 2020-10-08 ENCOUNTER — Ambulatory Visit: Payer: Medicare Other | Admitting: Cardiology

## 2020-10-08 ENCOUNTER — Encounter: Payer: Self-pay | Admitting: Cardiology

## 2020-10-08 VITALS — BP 124/70 | HR 49 | Ht 70.0 in | Wt 179.0 lb

## 2020-10-08 DIAGNOSIS — I2510 Atherosclerotic heart disease of native coronary artery without angina pectoris: Secondary | ICD-10-CM

## 2020-10-08 DIAGNOSIS — I4510 Unspecified right bundle-branch block: Secondary | ICD-10-CM | POA: Diagnosis not present

## 2020-10-08 DIAGNOSIS — E7800 Pure hypercholesterolemia, unspecified: Secondary | ICD-10-CM

## 2020-10-08 DIAGNOSIS — Z951 Presence of aortocoronary bypass graft: Secondary | ICD-10-CM

## 2020-10-08 DIAGNOSIS — I10 Essential (primary) hypertension: Secondary | ICD-10-CM

## 2020-10-08 MED ORDER — ATORVASTATIN CALCIUM 10 MG PO TABS: 10.0000 mg | ORAL_TABLET | Freq: Every day | ORAL | 3 refills | Status: DC

## 2020-10-08 MED ORDER — HYDROCHLOROTHIAZIDE 12.5 MG PO CAPS: ORAL_CAPSULE | ORAL | 3 refills | Status: DC

## 2020-10-08 MED ORDER — AMLODIPINE BESYLATE 10 MG PO TABS: 10.0000 mg | ORAL_TABLET | Freq: Every day | ORAL | 3 refills | Status: DC

## 2020-10-08 MED ORDER — LOSARTAN POTASSIUM 50 MG PO TABS: 50.0000 mg | ORAL_TABLET | Freq: Every day | ORAL | 3 refills | Status: DC

## 2020-10-08 NOTE — Addendum Note (Signed)
Addended by: Kathyrn Lass on: 10/08/2020 11:42 AM   Modules accepted: Orders

## 2020-11-01 ENCOUNTER — Other Ambulatory Visit: Payer: Self-pay | Admitting: Gastroenterology

## 2020-11-01 DIAGNOSIS — R1319 Other dysphagia: Secondary | ICD-10-CM | POA: Diagnosis not present

## 2020-11-01 DIAGNOSIS — R634 Abnormal weight loss: Secondary | ICD-10-CM | POA: Diagnosis not present

## 2020-11-01 DIAGNOSIS — R6889 Other general symptoms and signs: Secondary | ICD-10-CM | POA: Diagnosis not present

## 2020-11-02 ENCOUNTER — Other Ambulatory Visit: Payer: Self-pay | Admitting: Cardiology

## 2020-11-15 ENCOUNTER — Other Ambulatory Visit: Payer: Self-pay | Admitting: Urology

## 2020-11-15 DIAGNOSIS — N183 Chronic kidney disease, stage 3 unspecified: Secondary | ICD-10-CM

## 2020-11-19 ENCOUNTER — Ambulatory Visit
Admission: RE | Admit: 2020-11-19 | Discharge: 2020-11-19 | Disposition: A | Discharge status: Home / Self Care | Payer: Medicare Other | Source: Ambulatory Visit | Attending: Gastroenterology | Admitting: Gastroenterology

## 2020-11-19 ENCOUNTER — Other Ambulatory Visit: Payer: Self-pay | Admitting: Gastroenterology

## 2020-11-19 DIAGNOSIS — R1310 Dysphagia, unspecified: Secondary | ICD-10-CM | POA: Diagnosis not present

## 2020-11-19 DIAGNOSIS — R634 Abnormal weight loss: Secondary | ICD-10-CM

## 2020-11-19 DIAGNOSIS — R1319 Other dysphagia: Secondary | ICD-10-CM

## 2020-11-19 DIAGNOSIS — K5730 Diverticulosis of large intestine without perforation or abscess without bleeding: Secondary | ICD-10-CM | POA: Diagnosis not present

## 2020-11-23 ENCOUNTER — Other Ambulatory Visit: Payer: Self-pay

## 2020-11-23 ENCOUNTER — Ambulatory Visit
Admission: RE | Admit: 2020-11-23 | Discharge: 2020-11-23 | Disposition: A | Discharge status: Home / Self Care | Payer: Medicare Other | Source: Ambulatory Visit | Attending: Urology | Admitting: Urology

## 2020-11-23 DIAGNOSIS — N281 Cyst of kidney, acquired: Secondary | ICD-10-CM | POA: Diagnosis not present

## 2020-11-23 DIAGNOSIS — N3289 Other specified disorders of bladder: Secondary | ICD-10-CM | POA: Diagnosis not present

## 2020-11-23 DIAGNOSIS — R634 Abnormal weight loss: Secondary | ICD-10-CM | POA: Diagnosis not present

## 2020-11-23 DIAGNOSIS — N183 Chronic kidney disease, stage 3 unspecified: Secondary | ICD-10-CM

## 2020-11-24 DIAGNOSIS — K2270 Barrett's esophagus without dysplasia: Secondary | ICD-10-CM | POA: Diagnosis not present

## 2020-11-24 DIAGNOSIS — K3189 Other diseases of stomach and duodenum: Secondary | ICD-10-CM | POA: Diagnosis not present

## 2020-11-24 DIAGNOSIS — K2960 Other gastritis without bleeding: Secondary | ICD-10-CM | POA: Diagnosis not present

## 2020-11-24 DIAGNOSIS — K2281 Esophageal polyp: Secondary | ICD-10-CM | POA: Diagnosis not present

## 2020-11-24 DIAGNOSIS — R634 Abnormal weight loss: Secondary | ICD-10-CM | POA: Diagnosis not present

## 2020-11-24 DIAGNOSIS — K2930 Chronic superficial gastritis without bleeding: Secondary | ICD-10-CM | POA: Diagnosis not present

## 2020-11-26 DIAGNOSIS — D49512 Neoplasm of unspecified behavior of left kidney: Secondary | ICD-10-CM | POA: Diagnosis not present

## 2020-11-29 DIAGNOSIS — K2270 Barrett's esophagus without dysplasia: Secondary | ICD-10-CM | POA: Diagnosis not present

## 2020-11-29 DIAGNOSIS — K2930 Chronic superficial gastritis without bleeding: Secondary | ICD-10-CM | POA: Diagnosis not present

## 2020-12-01 ENCOUNTER — Other Ambulatory Visit: Payer: Self-pay | Admitting: Urology

## 2020-12-01 DIAGNOSIS — D49512 Neoplasm of unspecified behavior of left kidney: Secondary | ICD-10-CM

## 2020-12-09 DIAGNOSIS — K2930 Chronic superficial gastritis without bleeding: Secondary | ICD-10-CM | POA: Diagnosis not present

## 2020-12-09 DIAGNOSIS — K449 Diaphragmatic hernia without obstruction or gangrene: Secondary | ICD-10-CM | POA: Diagnosis not present

## 2020-12-09 DIAGNOSIS — K2270 Barrett's esophagus without dysplasia: Secondary | ICD-10-CM | POA: Diagnosis not present

## 2020-12-09 DIAGNOSIS — K219 Gastro-esophageal reflux disease without esophagitis: Secondary | ICD-10-CM | POA: Diagnosis not present

## 2020-12-13 ENCOUNTER — Telehealth: Payer: Self-pay | Admitting: Cardiology

## 2020-12-13 ENCOUNTER — Other Ambulatory Visit: Payer: Self-pay | Admitting: Cardiology

## 2020-12-13 DIAGNOSIS — I25708 Atherosclerosis of coronary artery bypass graft(s), unspecified, with other forms of angina pectoris: Secondary | ICD-10-CM

## 2020-12-13 DIAGNOSIS — I2510 Atherosclerotic heart disease of native coronary artery without angina pectoris: Secondary | ICD-10-CM

## 2020-12-13 NOTE — Telephone Encounter (Signed)
New  Message:     Wife says pt have Cancer and she needs to ask you some questions from his Chart please.

## 2020-12-13 NOTE — Telephone Encounter (Signed)
Spoke to patient's wife she stated husband has renal cancer.Stated she wanted Dr.Jordan's opinion if ok to have surgery. Spoke to Cisne he advised he is ok to have surgery, but will need a lexiscan myoview.Advised scheduler will call back to schedule.

## 2020-12-14 ENCOUNTER — Ambulatory Visit
Admission: RE | Admit: 2020-12-14 | Discharge: 2020-12-14 | Disposition: A | Discharge status: Home / Self Care | Payer: Medicare Other | Source: Ambulatory Visit | Attending: Urology | Admitting: Urology

## 2020-12-14 ENCOUNTER — Other Ambulatory Visit: Payer: Self-pay | Admitting: Urology

## 2020-12-14 DIAGNOSIS — D49512 Neoplasm of unspecified behavior of left kidney: Secondary | ICD-10-CM

## 2020-12-14 DIAGNOSIS — Z471 Aftercare following joint replacement surgery: Secondary | ICD-10-CM | POA: Diagnosis not present

## 2020-12-14 DIAGNOSIS — Z951 Presence of aortocoronary bypass graft: Secondary | ICD-10-CM | POA: Diagnosis not present

## 2020-12-14 DIAGNOSIS — Z01818 Encounter for other preprocedural examination: Secondary | ICD-10-CM | POA: Diagnosis not present

## 2020-12-17 ENCOUNTER — Telehealth (HOSPITAL_COMMUNITY): Payer: Self-pay | Admitting: *Deleted

## 2020-12-17 NOTE — Telephone Encounter (Signed)
Close encounter 

## 2020-12-22 ENCOUNTER — Other Ambulatory Visit: Payer: Self-pay

## 2020-12-22 ENCOUNTER — Ambulatory Visit (HOSPITAL_COMMUNITY)
Admission: RE | Admit: 2020-12-22 | Discharge: 2020-12-22 | Disposition: A | Discharge status: Home / Self Care | Payer: Medicare Other | Source: Ambulatory Visit | Attending: Cardiovascular Disease | Admitting: Cardiovascular Disease

## 2020-12-22 DIAGNOSIS — I2510 Atherosclerotic heart disease of native coronary artery without angina pectoris: Secondary | ICD-10-CM | POA: Diagnosis not present

## 2020-12-22 LAB — MYOCARDIAL PERFUSION IMAGING
Peak HR: 73 {beats}/min
Rest HR: 52 {beats}/min
SDS: 0
SRS: 1
SSS: 1
TID: 0.98

## 2020-12-22 MED ORDER — REGADENOSON 0.4 MG/5ML IV SOLN
0.4000 mg | Freq: Once | INTRAVENOUS | Status: AC
  Administered 2020-12-22: 0.4 mg via INTRAVENOUS

## 2020-12-22 MED ORDER — TECHNETIUM TC 99M TETROFOSMIN IV KIT
30.8000 | PACK | Freq: Once | INTRAVENOUS | Status: AC | PRN
  Administered 2020-12-22: 30.8 via INTRAVENOUS
  Filled 2020-12-22: qty 31

## 2020-12-22 MED ORDER — TECHNETIUM TC 99M TETROFOSMIN IV KIT
10.4000 | PACK | Freq: Once | INTRAVENOUS | Status: AC | PRN
  Administered 2020-12-22: 10.4 via INTRAVENOUS
  Filled 2020-12-22: qty 11

## 2021-01-01 ENCOUNTER — Ambulatory Visit
Admission: RE | Admit: 2021-01-01 | Discharge: 2021-01-01 | Disposition: A | Discharge status: Home / Self Care | Payer: Medicare Other | Source: Ambulatory Visit | Attending: Urology | Admitting: Urology

## 2021-01-01 ENCOUNTER — Other Ambulatory Visit: Payer: Self-pay

## 2021-01-01 DIAGNOSIS — N281 Cyst of kidney, acquired: Secondary | ICD-10-CM | POA: Diagnosis not present

## 2021-01-01 DIAGNOSIS — N2889 Other specified disorders of kidney and ureter: Secondary | ICD-10-CM | POA: Diagnosis not present

## 2021-01-01 DIAGNOSIS — D49512 Neoplasm of unspecified behavior of left kidney: Secondary | ICD-10-CM

## 2021-01-01 MED ORDER — GADOBENATE DIMEGLUMINE 529 MG/ML IV SOLN
15.0000 mL | Freq: Once | INTRAVENOUS | Status: AC | PRN
  Administered 2021-01-01: 15 mL via INTRAVENOUS

## 2021-01-04 ENCOUNTER — Other Ambulatory Visit: Payer: Self-pay | Admitting: Urology

## 2021-01-04 DIAGNOSIS — N2889 Other specified disorders of kidney and ureter: Secondary | ICD-10-CM

## 2021-01-06 ENCOUNTER — Encounter: Payer: Self-pay | Admitting: *Deleted

## 2021-01-06 ENCOUNTER — Ambulatory Visit
Admission: RE | Admit: 2021-01-06 | Discharge: 2021-01-06 | Disposition: A | Discharge status: Home / Self Care | Payer: Medicare Other | Source: Ambulatory Visit | Attending: Urology | Admitting: Urology

## 2021-01-06 ENCOUNTER — Other Ambulatory Visit: Payer: Self-pay

## 2021-01-06 DIAGNOSIS — N2889 Other specified disorders of kidney and ureter: Secondary | ICD-10-CM | POA: Diagnosis not present

## 2021-01-06 HISTORY — PX: IR RADIOLOGIST EVAL & MGMT: IMG5224

## 2021-01-06 NOTE — Consult Note (Signed)
Chief Complaint: Patient was consulted remotely today (TeleHealth) for left renal mass at the request of Winter,Christopher Marjory Lies.    Referring Physician(s): Winter,Christopher Marjory Lies  History of Present Illness: Zachary Hall is a 86 y.o. male with a history of coronary artery disease, prior myocardial infarction and prior multivessel CABG who began developing unexplained weight loss earlier this year.  A CT scan of the abdomen and pelvis was performed and demonstrated a contour abnormality arising from the lateral interpolar left kidney.  Subsequently, a renal ultrasound was pursued confirming the presence of a solid mass.  Next, an MRI of the abdomen with gadolinium contrast was performed confirming that the solid renal mass demonstrates contrast-enhancement.  The lesion was somewhat difficult to measure by MRI due to motion related artifact.  I estimate the lesion to be approximately 3.4-3.6 cm in maximal diameter.  Zachary Hall denies abdominal pain, flank pain, hematuria or other systemic symptoms.  Past Medical History:  Diagnosis Date   Bradycardia    Coronary artery disease    Gout    History of atrial fibrillation    Previously on amiodarone   Hx of CABG    Hypercholesterolemia    Hypertension    Myocardial infarction [facility])    Nocturia     Past Surgical History:  Procedure Laterality Date   CARDIAC CATHETERIZATION  01/04/2010   CORONARY ARTERY BYPASS GRAFT  02/07/2010   LIMA to LAD, SVG to 2nd DX, SVG to OM   EYE SURGERY Bilateral    Cataract Extraction   HEMORRHOID SURGERY     x 2   HEMORRHOID SURGERY N/A 11/13/2012   Procedure: [place] Hemorrhoidectomy;  Surgeon: Merrie Roof, MD;  Location: [place];  Service: General;  Laterality: N/A;   INGUINAL HERNIA REPAIR Right 01/22/2017   Procedure: RIGHT INGUINAL HERNIA REPAIR WITH MESH;  Surgeon: Armandina Gemma, MD;  Location: [place];  Service: General;  Laterality: Right;   INSERTION OF MESH Right 01/22/2017   Procedure:  INSERTION OF MESH;  Surgeon: Armandina Gemma, MD;  Location: [place];  Service: General;  Laterality: Right;   NASAL POLYP EXCISION     OTHER SURGICAL HISTORY  06/05/2002   Penile Implant    Allergies: Lisinopril  Medications: Prior to Admission medications   Medication Sig Start Date End Date Taking? Authorizing Provider  amLODipine (NORVASC) 10 MG tablet Take 1 tablet (10 mg total) by mouth daily. 10/08/20   [name], Peter M, MD  aspirin 81 MG tablet Take 81 mg by mouth every other day. At night    [provider]  atorvastatin (LIPITOR) 10 MG tablet Take 1 tablet (10 mg total) by mouth daily at 6 PM. 10/08/20   [name], Peter M, MD  cetirizine (ZYRTEC) 10 MG tablet Take by mouth.    [provider]  CHERRY PO Take 1 capsule by mouth 2 (two) times daily. ** Black Cherry **    [provider]  cherry syrup syrup Take by mouth.    [provider]  clobetasol ointment (TEMOVATE) 8.46 % Apply 1 application topically daily as needed. Skin irritation 05/18/15   [provider]  fluocinonide cream (LIDEX) 9.62 % Apply 1 application topically as needed (dry skin).  05/14/14   [provider]  fluorouracil (EFUDEX) 5 % cream Apply 1 application topically daily. To top of scalp 05/18/15   [provider]  Fluticasone-Salmeterol (ADVAIR DISKUS) 250-50 MCG/DOSE AEPB Inhale 1 puff into the lungs every 12 (twelve) hours  as needed (as needed for wheezing). Reported on 09/14/2015    [provider]  Fluticasone-Salmeterol (ADVAIR) 100-50 MCG/DOSE AEPB  08/30/09   [provider]  hydrochlorothiazide (MICROZIDE) 12.5 MG capsule TAKE 1 CAPSULE BY MOUTH EVERY DAY 10/08/20   [name], Peter M, MD  losartan (COZAAR) 50 MG tablet Take 1 tablet (50 mg total) by mouth daily. 10/08/20   [name], Peter M, MD  Multiple Vitamin (MULTIVITAMIN IRON-FREE) TABS Take 1 tablet by mouth daily.    [provider]  Multiple Vitamins-Minerals (CENTRUM  ADULTS) TABS Take by mouth.    [provider]  nitroGLYCERIN (NITROSTAT) 0.4 MG SL tablet *PLACE 1 TABLET UNDER TONGUE EVERY 5 MINS., UP TO 3 DOSES AS NEEDED FOR CHEST PAIN* 11/02/20   [name], Peter M, MD     Family History  Problem Relation Age of Onset   Alcohol abuse Father    Colon cancer Mother    Cancer Mother        colon and bone   Heart attack Neg Hx    Hypertension Neg Hx    Stroke Neg Hx     Social History   Socioeconomic History   Marital status: Married    Spouse name: Not on file   Number of children: 1   Years of education: Not on file   Highest education level: Not on file  Occupational History   Occupation: Retired  Tobacco Use   Smoking status: Former    Pack years: 0.00    Types: Cigarettes    Quit date: 01/07/1961    Years since quitting: 60.0   Smokeless tobacco: Former  Scientific laboratory technician Use: Never used  Substance and Sexual Activity   Alcohol use: Yes    Alcohol/week: 4.0 standard drinks    Types: 4 Cans of beer per week   Drug use: No   Sexual activity: Not on file  Other Topics Concern   Not on file  Social History Narrative   Not on file   Social Determinants of Health   Financial Resource Strain: Not on file  Food Insecurity: Not on file  Transportation Needs: Not on file  Physical Activity: Not on file  Stress: Not on file  Social Connections: Not on file    ECOG Status: 0 - Asymptomatic  Review of Systems  Review of Systems: A 12 point ROS discussed and pertinent positives are indicated in the HPI above.  All other systems are negative.  Physical Exam No direct physical exam was performed (except for noted visual exam findings with Video Visits).    Vital Signs: There were no vitals taken for this visit.  Imaging: DG Chest 2 View  Result Date: 12/14/2020 CLINICAL DATA:  86 year old male with a history preoperative chest x-ray EXAM: CHEST - 2 VIEW COMPARISON:  11/05/2012 FINDINGS: Cardiomediastinal silhouette  unchanged in size and contour. Surgical changes of median sternotomy and CABG. No pneumothorax pleural effusion or confluent airspace disease. Degenerative changes of the spine. No acute displaced fracture IMPRESSION: Negative for acute cardiopulmonary disease. Surgical changes of median sternotomy and CABG Electronically Signed   By: Corrie Mckusick D.O.   On: 12/14/2020 13:09   DG Pelvis 1-2 Views  Result Date: 12/15/2020 CLINICAL DATA:  MRI history of penile prosthesis EXAM: PELVIS - 1-2 VIEW COMPARISON:  CT 11/19/2020 FINDINGS: No fracture or malalignment. Metallic components of penile prosthesis are noted inferior to the pubic symphysis. IMPRESSION: Metallic components of penile prosthesis inferior to the pubic symphysis.  Patient noted to have inflatable penile prosthesis on CT, many of which are MRI compatible or conditional; would confirm safety profile of the implant with specific type of implant placed. Electronically Signed   By: Donavan Foil M.D.   On: 12/15/2020 22:31   MR ABDOMEN WWO CONTRAST  Result Date: 01/01/2021 CLINICAL DATA:  Further evaluation of left renal lesion. EXAM: MRI ABDOMEN WITHOUT AND WITH CONTRAST TECHNIQUE: Multiplanar multisequence MR imaging of the abdomen was performed both before and after the administration of intravenous contrast. CONTRAST:  57mL MULTIHANCE GADOBENATE DIMEGLUMINE 529 MG/ML IV SOLN COMPARISON:  CT November 19, 2020 and renal ultrasound November 23, 2020 FINDINGS: Lower chest: No acute abnormality on these limited views. Hepatobiliary: No hepatic steatosis. No suspicious hepatic lesion. Pharyngeal cap on the gallbladder which is otherwise unremarkable. No biliary ductal dilation. Pancreas:  Within normal limits. Spleen:  Within normal limits. Adrenals/Urinary Tract:  Bilateral adrenal glands are unremarkable. Heterogeneous solid LEFT interpolar renal mass measuring 3.1 x 2.5 x 2.9 cm on image 12/6 and 10/2. The lesion is intrinsically isodose slightly hypodense  to background renal parenchyma and intensely enhancing on postcontrast imaging without evidence of signal loss on out of phase or fat suppression sequences. Nonenhancing 8 mm left upper pole renal cyst. No hydronephrosis. Similar appearance of the large elongated right peripelvic cyst which displaces elements of the collecting system and renal vasculature at the hilum. 1.2 cm right upper pole renal cyst and a smaller exophytic right interpolar renal cyst. No hydronephrosis. Stomach/Bowel: Periampullary duodenal diverticulum otherwise the visualized portions of the GI tract within the abdomen are unremarkable. Vascular/Lymphatic: No evidence of renal tumor in vein. No abdominal aortic aneurysm. Short segment curvilinear filling defect within the infrarenal abdominal aorta on image 54/10 which is nonaneurysmal and favored to represent a short segment dissection vs penetrating atheromatous ulcer, which in retrospect was subtly evident on prior non contrasted CT of the abdomen pelvis and appears unchanged. No pathologically enlarged abdominal lymph nodes. Other:  No abdominal ascites. Musculoskeletal: No suspicious bone lesions identified. IMPRESSION: 1. Heterogeneous solid enhancing 3.1 cm LEFT interpolar renal mass, suspicious for a renal cell carcinoma. No evidence of renal tumor in vein or abdominal metastases. 2. Similar appearance of the large elongated right peripelvic cyst without obvious complicating feature. 3. Stable short segment curvilinear filling defect within the infrarenal abdominal aorta is favored to represent a short segment dissection vs. Penetrating atheromatous ulcer, without aneurysmal dilation of the aorta. For which continued attention/monitoring on follow-up imaging in this oncology patient is sufficient Electronically Signed   By: Dahlia Bailiff MD   On: 01/01/2021 12:55   Myocardial Perfusion Imaging  Result Date: 12/22/2020  There was no ST segment deviation noted during stress.  The  study is normal with no ischemia.  This is a low risk study.  Study was not gated due to frequent PVCs.     Labs:  CBC: Recent Labs    10/01/20 0921  WBC 7.4  HGB 13.1  HCT 38.4  PLT 182    COAGS: No results for input(s): INR, APTT in the last 8760 hours.  BMP: Recent Labs    10/01/20 0921  NA 142  K 4.2  CL 105  CO2 24  GLUCOSE 97  BUN 17  CALCIUM 8.9  CREATININE 1.14    LIVER FUNCTION TESTS: Recent Labs    10/01/20 0921  BILITOT 0.5  AST 13  ALT 16  ALKPHOS 61  PROT 6.0  ALBUMIN 4.3  TUMOR MARKERS: No results for input(s): AFPTM, CEA, CA199, CHROMGRNA in the last 8760 hours.  Assessment and Plan:  86 year old male with an incidentally detected 3.4 cm enhancing mass arising exophytic from the lateral aspect of the interpolar left kidney.  Statistically speaking, this renal neoplasm has an 80% chance of representing a renal cell carcinoma and a 20% chance of representing a benign entity such as an oncocytoma.  The natural history of enhancing renal neoplasms were discussed with Zachary Hall and Zachary Hall.  We also discussed the options including robotic assisted partial nephrectomy versus percutaneous cryoablation.  They understand that surgery is the gold standard with the highest rate of cure but a slightly longer recovery period and slightly increased risk of age related complications from the longer duration under general anesthesia.  On the other hand, cryoablation offers a slightly lower rate of local control but offers a much faster procedure time and return to normal activities.  Risk of significant bleeding and loss of renal function following cryoablation is low but real at approximately 1-2%.  After describing the procedures and the associated risks and benefits fully, time was given to answer all of their questions.  At this point, they would like to discuss together and pray on the decision.  They are leaning toward robotic assisted partial  nephrectomy but would like to fully consider all of their options.  I let them know I would be happy to discuss further with them and answer any questions if they think of questions after today's visit.  They will call the office back to let us know which treatment pathway they decide to pursue.  Thank you for this interesting consult.  I greatly enjoyed meeting Zachary Hall and look forward to participating in their care.  A copy of this report was sent to the requesting provider on this date.  Electronically Signed: Criselda Peaches 01/06/2021, 3:34 PM   I spent a total of  40 Minutes  in remote  clinical consultation, greater than 50% of which was counseling/coordinating care for left renal neoplasm.    Visit type: Audio only (telephone). Audio (no video) only due to patient preference. Alternative for in-person consultation at [facility], [place] [address] [place], [place], [place]. This visit type was conducted due to national recommendations for restrictions regarding the COVID-19 Pandemic (e.g. social distancing).  This format is felt to be most appropriate for this patient at this time.  All issues noted in this document were discussed and addressed.

## 2021-01-12 ENCOUNTER — Other Ambulatory Visit: Payer: Self-pay | Admitting: Urology

## 2021-01-12 ENCOUNTER — Telehealth: Payer: Self-pay | Admitting: Cardiology

## 2021-01-12 NOTE — Telephone Encounter (Signed)
   [facility] Pre-operative Risk Assessment    Request for surgical clearance:  What type of surgery is being performed? Robotic Left Partial Nephrectomy  When is this surgery scheduled? 02/25/2021  What type of clearance is required (medical clearance vs. Pharmacy clearance to hold med vs. Both)? Both   Are there any medications that need to be held prior to surgery and how long? aspirin 81 MG tablet and 5 days prior   Practice name and name of physician performing surgery? [facility] and DR.Harrell Gave Winter   What is the office phone number? [phone]   7.   What is the office fax number? [phone]  8.   Anesthesia type (None, local, MAC, general) ?General    Lars Pinks 01/12/2021, 1:38 PM  _________________________________________________________________   (provider comments below)

## 2021-01-12 NOTE — Telephone Encounter (Signed)
Dr. [name]  Are you ok with this patient holding his ASA 5 days prior to a partial nephrectomy? He has a remote hx of CAD s/p CABG 2011. Given his surgery, his wife was concerned and he underwent Lexiscan stress test that was found ot be normal. Called and spoke with his wife today and he is not having any cardiac issues at this time.   Please send your recommendations to the pre op pool   Thank you

## 2021-01-16 NOTE — Telephone Encounter (Signed)
He may hold ASA for 5 days prior to kidney surgery.  Breckon Reeves [name] MD, [facility]

## 2021-01-17 NOTE — Telephone Encounter (Signed)
    Zachary Hall DOB:  21-Aug-1934  MRN:  [id number]   Primary Cardiologist: Peter [name], MD  Chart reviewed as part of pre-operative protocol coverage. Patient has a history of CAD s/p CABG in 2011. Recent Myoview on 12/22/2020 was low risk with no evidence of ischemia. My colleague Kathyrn Drown, NP, called and spoke with patient's wife on 01/12/2021 and she reported that he was having no cardiac complaints. Given past medical history and time since last visit, based on ACC/AHA guidelines, [name] would be at acceptable risk for the planned procedure without further cardiovascular testing.   Per Dr. [name], patient may hold Aspirin for 5 days prior to kidney surgery. This should be restarted as soon as able following procedure.   I will route this recommendation to the requesting party via Epic fax function and remove from pre-op pool.  Please call with questions.  Darreld Mclean, PA-C 01/17/2021, 9:22 AM

## 2021-01-17 NOTE — Telephone Encounter (Signed)
Called and left message on home machine with Dr. Doug Sou recommendations for holding Aspirin (OK per DPR). Pre-op form faxed back to requesting surgeon's office. Will remove from pre-op pool.  Darreld Mclean, PA-C 01/17/2021 9:34 AM

## 2021-01-20 ENCOUNTER — Ambulatory Visit: Payer: Medicare Other | Admitting: Adult Health

## 2021-01-24 ENCOUNTER — Ambulatory Visit: Payer: Medicare Other | Admitting: Adult Health

## 2021-01-24 ENCOUNTER — Encounter: Payer: Self-pay | Admitting: Adult Health

## 2021-01-25 DIAGNOSIS — L570 Actinic keratosis: Secondary | ICD-10-CM | POA: Diagnosis not present

## 2021-01-25 DIAGNOSIS — L821 Other seborrheic keratosis: Secondary | ICD-10-CM | POA: Diagnosis not present

## 2021-01-25 DIAGNOSIS — Z85828 Personal history of other malignant neoplasm of skin: Secondary | ICD-10-CM | POA: Diagnosis not present

## 2021-01-25 DIAGNOSIS — C4359 Malignant melanoma of other part of trunk: Secondary | ICD-10-CM | POA: Diagnosis not present

## 2021-02-03 ENCOUNTER — Ambulatory Visit: Payer: Medicare Other | Admitting: Adult Health

## 2021-02-10 DIAGNOSIS — D225 Melanocytic nevi of trunk: Secondary | ICD-10-CM | POA: Diagnosis not present

## 2021-02-10 DIAGNOSIS — C4359 Malignant melanoma of other part of trunk: Secondary | ICD-10-CM | POA: Diagnosis not present

## 2021-02-14 NOTE — Progress Notes (Signed)
DUE TO COVID-19 ONLY ONE VISITOR IS ALLOWED TO COME WITH YOU AND STAY IN THE WAITING ROOM ONLY DURING PRE OP AND PROCEDURE DAY OF SURGERY. THE 1 VISITOR  MAY VISIT WITH YOU AFTER SURGERY IN YOUR PRIVATE ROOM DURING VISITING HOURS ONLY!  YOU NEED TO HAVE A COVID 19 TEST ON___ 02/22/2021 ____ @_______ , THIS TEST MUST BE DONE BEFORE SURGERY,  COVID TESTING SITE [address] [place] [address], IT IS ON THE RIGHT GOING OUT [address] APPROXIMATELY  2 MINUTES PAST [employer] ON THE RIGHT. ONCE YOUR COVID TEST IS COMPLETED,  PLEASE BEGIN THE QUARANTINE INSTRUCTIONS AS OUTLINED IN YOUR HANDOUT.                Zachary Hall  02/14/2021   Your procedure is scheduled on:  02/25/2021   Report to [facility] Main  Entrance   Report to admitting at      1030AM     Call this number if you have problems the morning of surgery [phone]    Remember: Do not eat food , candy gum or mints :After Midnight. You may have clear liquids from midnight until  0915am    CLEAR LIQUID DIET   Foods Allowed                                                                       Coffee and tea, regular and decaf                              Plain Jell-O any favor except red or purple                                            Fruit ices (not with fruit pulp)                                      Iced Popsicles                                     Carbonated beverages, regular and diet                                    Cranberry, grape and apple juices Sports drinks like Gatorade Lightly seasoned clear broth or consume(fat free) Sugar, honey syrup   _____________________________________________________________________    BRUSH YOUR TEETH MORNING OF SURGERY AND RINSE YOUR MOUTH OUT, NO CHEWING GUM CANDY OR MINTS.     Take these medicines the morning of surgery with A SIP OF WATER: amlodipoine   DO NOT TAKE ANY DIABETIC MEDICATIONS DAY OF YOUR SURGERY                                You may not have any metal  on your body including hair pins and              piercings  Do not wear jewelry, make-up, lotions, powders or perfumes, deodorant             Do not wear nail polish on your fingernails.  Do not shave  48 hours prior to surgery.              Men may shave face and neck.   Do not bring valuables to the hospital. [place].  Contacts, dentures or bridgework may not be worn into surgery.  Leave suitcase in the car. After surgery it may be brought to your room.     Patients discharged the day of surgery will not be allowed to drive home. IF YOU ARE HAVING SURGERY AND GOING HOME THE SAME DAY, YOU MUST HAVE AN ADULT TO DRIVE YOU HOME AND BE WITH YOU FOR 24 HOURS. YOU MAY GO HOME BY TAXI OR UBER OR ORTHERWISE, BUT AN ADULT MUST ACCOMPANY YOU HOME AND STAY WITH YOU FOR 24 HOURS.  Name and phone number of your driver:  Special Instructions: N/A              Please read over the following fact sheets you were given: _____________________________________________________________________  [facility] - Preparing for Surgery Before surgery, you can play an important role.  Because skin is not sterile, your skin needs to be as free of germs as possible.  You can reduce the number of germs on your skin by washing with CHG (chlorahexidine gluconate) soap before surgery.  CHG is an antiseptic cleaner which kills germs and bonds with the skin to continue killing germs even after washing. Please DO NOT use if you have an allergy to CHG or antibacterial soaps.  If your skin becomes reddened/irritated stop using the CHG and inform your nurse when you arrive at Short Stay. Do not shave (including legs and underarms) for at least 48 hours prior to the first CHG shower.  You may shave your face/neck. Please follow these instructions carefully:  1.  Shower with CHG Soap the night before surgery and the  morning of Surgery.  2.  If  you choose to wash your hair, wash your hair first as usual with your  normal  shampoo.  3.  After you shampoo, rinse your hair and body thoroughly to remove the  shampoo.                           4.  Use CHG as you would any other liquid soap.  You can apply chg directly  to the skin and wash                       Gently with a scrungie or clean washcloth.  5.  Apply the CHG Soap to your body ONLY FROM THE NECK DOWN.   Do not use on face/ open                           Wound or open sores. Avoid contact with eyes, ears mouth and genitals (private parts).                       Wash face,  Genitals (private parts) with your normal soap.             6.  Wash thoroughly, paying special attention to the area where your surgery  will be performed.  7.  Thoroughly rinse your body with warm water from the neck down.  8.  DO NOT shower/wash with your normal soap after using and rinsing off  the CHG Soap.                9.  Pat yourself dry with a clean towel.            10.  Wear clean pajamas.            11.  Place clean sheets on your bed the night of your first shower and do not  sleep with pets. Day of Surgery : Do not apply any lotions/deodorants the morning of surgery.  Please wear clean clothes to the hospital/surgery center.  FAILURE TO FOLLOW THESE INSTRUCTIONS MAY RESULT IN THE CANCELLATION OF YOUR SURGERY PATIENT SIGNATURE_________________________________  NURSE SIGNATURE__________________________________  ________________________________________________________________________

## 2021-02-16 ENCOUNTER — Encounter (HOSPITAL_COMMUNITY): Payer: Self-pay

## 2021-02-16 ENCOUNTER — Encounter (HOSPITAL_COMMUNITY)
Admission: RE | Admit: 2021-02-16 | Discharge: 2021-02-16 | Disposition: A | Discharge status: Home / Self Care | Payer: Medicare Other | Source: Ambulatory Visit | Attending: Urology | Admitting: Urology

## 2021-02-16 ENCOUNTER — Other Ambulatory Visit: Payer: Self-pay

## 2021-02-16 DIAGNOSIS — Z01812 Encounter for preprocedural laboratory examination: Secondary | ICD-10-CM | POA: Insufficient documentation

## 2021-02-16 HISTORY — DX: Gastro-esophageal reflux disease without esophagitis: K21.9

## 2021-02-16 HISTORY — DX: Other abnormalities of gait and mobility: R26.89

## 2021-02-16 HISTORY — DX: Barrett's esophagus without dysplasia: K22.70

## 2021-02-16 HISTORY — DX: Other specified disorders of kidney and ureter: N28.89

## 2021-02-16 HISTORY — DX: Malignant melanoma of skin, unspecified: C43.9

## 2021-02-16 HISTORY — DX: Unspecified osteoarthritis, unspecified site: M19.90

## 2021-02-16 LAB — CBC
HCT: 37.1 % — ABNORMAL LOW (ref 39.0–52.0)
Hemoglobin: 13 g/dL (ref 13.0–17.0)
MCH: 34.5 pg — ABNORMAL HIGH (ref 26.0–34.0)
MCHC: 35 g/dL (ref 30.0–36.0)
MCV: 98.4 fL (ref 80.0–100.0)
Platelets: 176 10*3/uL (ref 150–400)
RBC: 3.77 MIL/uL — ABNORMAL LOW (ref 4.22–5.81)
RDW: 12.9 % (ref 11.5–15.5)
WBC: 8.6 10*3/uL (ref 4.0–10.5)
nRBC: 0 % (ref 0.0–0.2)

## 2021-02-16 LAB — BASIC METABOLIC PANEL
Anion gap: 8 (ref 5–15)
BUN: 26 mg/dL — ABNORMAL HIGH (ref 8–23)
CO2: 26 mmol/L (ref 22–32)
Calcium: 9.5 mg/dL (ref 8.9–10.3)
Chloride: 100 mmol/L (ref 98–111)
Creatinine, Ser: 1.23 mg/dL (ref 0.61–1.24)
GFR, Estimated: 58 mL/min — ABNORMAL LOW (ref >60)
Glucose, Bld: 106 mg/dL — ABNORMAL HIGH (ref 70–99)
Potassium: 4.3 mmol/L (ref 3.5–5.1)
Sodium: 134 mmol/L — ABNORMAL LOW (ref 135–145)

## 2021-02-16 LAB — PSA: Prostatic Specific Antigen: 4.56 ng/mL — ABNORMAL HIGH (ref 0.00–4.00)

## 2021-02-16 NOTE — Progress Notes (Signed)
Anesthesia Chart Review   Case: [id number] Date/Time: 02/25/21 1214   Procedure: XI ROBOTIC ASSITED PARTIAL NEPHRECTOMY (Left)   Anesthesia type: General   Pre-op diagnosis: LEFT RENAL MASS   Location: [facility]OM 03 / [facility]   Surgeons: Ceasar Mons, MD       DISCUSSION:86 y.o. former smoker with h/o HTN, CAD, GERD, atrial fibrillation, left renal mass scheduled for above procedure 02/25/2021 with Dr. Harrell Gave Lovena Neighbours.   Per cardiology preoperative evaluation 01/17/2021, "Chart reviewed as part of pre-operative protocol coverage. Patient has a history of CAD s/p CABG in 2011. Recent Myoview on 12/22/2020 was low risk with no evidence of ischemia. My colleague Kathyrn Drown, NP, called and spoke with patient's wife on 01/12/2021 and she reported that he was having no cardiac complaints. Given past medical history and time since last visit, based on ACC/AHA guidelines, Zachary Hall would be at acceptable risk for the planned procedure without further cardiovascular testing.   Per Dr. [name], patient may hold Aspirin for 5 days prior to kidney surgery. This should be restarted as soon as able following procedure. "  Anticipate pt can proceed with planned procedure barring acute status change.   VS: BP 128/67   Pulse (!) 51   Temp (!) 36.4 C (Oral)   Resp 16   Ht 5\' 10"  (1.778 m)   Wt 72.8 kg   SpO2 100%   BMI 23.04 kg/m   PROVIDERS: Flinchum, Kelby Aline, FNP is PCP   [name], Peter, MD is Cardiologist  LABS: Labs reviewed: Acceptable for surgery. (all labs ordered are listed, but only abnormal results are displayed)  Labs Reviewed  CBC - Abnormal; Notable for the following components:      Result Value   RBC 3.77 (*)    HCT 37.1 (*)    MCH 34.5 (*)    All other components within normal limits  PSA - Abnormal; Notable for the following components:   Prostatic Specific Antigen 4.56 (*)    All other components within normal limits  BASIC METABOLIC PANEL - Abnormal;  Notable for the following components:   Sodium 134 (*)    Glucose, Bld 106 (*)    BUN 26 (*)    GFR, Estimated 58 (*)    All other components within normal limits  TYPE AND SCREEN     IMAGES:   EKG: 03/22/2020 Rate 49 bpm  Sinus bradycardia LAD RBBB  CV: Stress Test 12/22/2020 There was no ST segment deviation noted during stress. The study is normal with no ischemia. This is a low risk study. Study was not gated due to frequent PVCs.  Past Medical History:  Diagnosis Date   Arthritis    Balance problem    Barrett esophagus    Bradycardia    Coronary artery disease    GERD (gastroesophageal reflux disease)    Gout    History of atrial fibrillation    Previously on amiodarone   Hx of CABG    Hypercholesterolemia    Hypertension    Left renal mass    Melanoma ([place])    Left shoulder blade   Nocturia     Past Surgical History:  Procedure Laterality Date   CARDIAC CATHETERIZATION  01/04/2010   COLONOSCOPY     CORONARY ARTERY BYPASS GRAFT  02/07/2010   LIMA to LAD, SVG to 2nd DX, SVG to OM   EYE SURGERY Bilateral    Cataract Extraction   HEMORRHOID SURGERY     x  2   HEMORRHOID SURGERY N/A 11/13/2012   Procedure: [place] Hemorrhoidectomy;  Surgeon: Merrie Roof, MD;  Location: [facility];  Service: General;  Laterality: N/A;   INGUINAL HERNIA REPAIR Right 01/22/2017   Procedure: RIGHT INGUINAL HERNIA REPAIR WITH MESH;  Surgeon: Armandina Gemma, MD;  Location: [facility];  Service: General;  Laterality: Right;   INSERTION OF MESH Right 01/22/2017   Procedure: INSERTION OF MESH;  Surgeon: Armandina Gemma, MD;  Location: [facility];  Service: General;  Laterality: Right;   IR RADIOLOGIST EVAL & MGMT  01/06/2021   NASAL POLYP EXCISION     OTHER SURGICAL HISTORY  06/05/2002   Penile Implant   UPPER GI ENDOSCOPY      MEDICATIONS:  amLODipine (NORVASC) 10 MG tablet   ammonium lactate (AMLACTIN) 12 % cream   aspirin 81 MG tablet   atorvastatin (LIPITOR) 10 MG tablet   cetirizine  (ZYRTEC) 10 MG tablet   CHERRY PO   hydrochlorothiazide (MICROZIDE) 12.5 MG capsule   Lactobacillus (DIGESTIVE HEALTH PROBIOTIC PO)   losartan (COZAAR) 50 MG tablet   Multiple Vitamins-Minerals (CENTRUM ADULTS) TABS   mupirocin ointment (BACTROBAN) 2 %   naproxen sodium (ALEVE) 220 MG tablet   nitroGLYCERIN (NITROSTAT) 0.4 MG SL tablet   omeprazole (PRILOSEC) 40 MG capsule   No current facility-administered medications for this encounter.    Konrad Felix, PA-C [facility] Pre-Surgical Testing [phone]

## 2021-02-16 NOTE — Anesthesia Preprocedure Evaluation (Addendum)
Anesthesia Evaluation  Patient identified by MRN, date of birth, ID band Patient awake    Reviewed: Allergy & Precautions, NPO status , Patient's Chart, lab work & pertinent test results  History of Anesthesia Complications Negative for: history of anesthetic complications  Airway Mallampati: IV  TM Distance: <3 FB Neck ROM: Full    Dental no notable dental hx.    Pulmonary neg pulmonary ROS, former smoker,    Pulmonary exam normal        Cardiovascular hypertension, Pt. on medications + CAD and + CABG (2011)  Normal cardiovascular exam+ dysrhythmias Atrial Fibrillation   EKG 03/22/2020: SB, RBBB  Stress test 12/22/2020: low risk study    Neuro/Psych negative neurological ROS  negative psych ROS   GI/Hepatic Neg liver ROS, GERD  Medicated and Controlled,  Endo/Other  negative endocrine ROS  Renal/GU Left renal mass  negative genitourinary   Musculoskeletal  (+) Arthritis ,   Abdominal   Peds  Hematology negative hematology ROS (+)   Anesthesia Other Findings Day of surgery medications reviewed with patient.  Reproductive/Obstetrics negative OB ROS                           Anesthesia Physical Anesthesia Plan  ASA: 3  Anesthesia Plan: General   Post-op Pain Management:    Induction: Intravenous  PONV Risk Score and Plan: 4 or greater and Treatment may vary due to age or medical condition, Dexamethasone and Ondansetron  Airway Management Planned: Oral ETT  Additional Equipment: Arterial line  Intra-op Plan:   Post-operative Plan: Extubation in OR  Informed Consent: I have reviewed the patients History and Physical, chart, labs and discussed the procedure including the risks, benefits and alternatives for the proposed anesthesia with the patient or authorized representative who has indicated his/her understanding and acceptance.     Dental advisory given  Plan Discussed  with: CRNA  Anesthesia Plan Comments: (See PAT note 02/16/2021, Konrad Felix, PA-C)     Anesthesia Quick Evaluation

## 2021-02-16 NOTE — Progress Notes (Addendum)
COVID Vaccine Completed: Yes Date COVID Vaccine completed: x2 Has received booster:x1 COVID vaccine manufacturer: Pfizer     Date of COVID positive in last 90 days: No  PCP - Laverna Peace FNP establishing care Cardiologist - Dr. Peter [name] last office visit note 10/08/20 in epic  Chest x-ray - 12/14/20 in epic EKG - 03/22/20 in epic Stress Test - 12/22/20 in epic ECHO -  Cardiac Cath - greater than 2 years Pacemaker/ICD device last checked: N/A  Sleep Study - N/A CPAP - N/A  Fasting Blood Sugar - N/A Checks Blood Sugar __N/A___ times a day  Blood Thinner Instructions: N/A Aspirin Instructions: Aspirin Last Dose: 02/19/2021  Activity level: Can go up a flight of stairs and activities of daily living without stopping and without symptoms    Anesthesia review: CAD, HTN, History of A. Fib,CABG  Patient denies shortness of breath, fever, cough and chest pain at PAT appointment   Patient verbalized understanding of instructions that were given to them at the PAT appointment. Patient was also instructed that they will need to review over the PAT instructions again at home before surgery.

## 2021-02-16 NOTE — Patient Instructions (Addendum)
DUE TO COVID-19 ONLY ONE VISITOR IS ALLOWED TO COME WITH YOU AND STAY IN THE WAITING ROOM ONLY DURING PRE OP AND PROCEDURE.   **NO VISITORS ARE ALLOWED IN THE SHORT STAY AREA OR RECOVERY ROOM!!**  IF YOU WILL BE ADMITTED INTO THE HOSPITAL YOU ARE ALLOWED ONLY TWO SUPPORT PEOPLE DURING VISITATION HOURS ONLY (10AM -8PM)   The support person(s) may change daily. The support person(s) must pass our screening, gel in and out, and wear a mask at all times, including in the patient's room. Patients must also wear a mask when staff or their support person are in the room.  No visitors under the age of 62. Any visitor under the age of 46 must be accompanied by an adult.    COVID SWAB TESTING MUST BE COMPLETED ON:  Wednesday, February 23, 2021 at 10:00 AM   Fate Entrance [place], [address], must go inside of the hospital, NOT A DRIVE THRU!  You are not required to quarantine, however you are required to wear a well-fitted mask when you are out and around people not in your household.  Hand Hygiene often Do NOT share personal items Notify your provider if you are in close contact with someone who has COVID or you develop fever 100.4 or greater, new onset of sneezing, cough, sore throat, shortness of breath or body aches.   Your procedure is scheduled on: Friday, February 25, 2021   Report to [facility] Main  Entrance    Report to admitting at 10:30 AM   Call this number if you have problems the morning of surgery [phone]   Do not eat food :After Midnight.   May have liquids until  9:30AM  day of surgery  CLEAR LIQUID DIET  Foods Allowed                                                                     Foods Excluded  Water, Black Coffee and tea, regular and decaf                             liquids that you cannot  Plain Jell-O in any flavor  (No red)                                           see through such as: Fruit ices (not  with fruit pulp)                                     milk, soups, orange juice              Iced Popsicles (No red)                                    All solid food  Apple juices Sports drinks like Gatorade (No red) Lightly seasoned clear broth or consume(fat free) Sugar, honey syrup  Sample Menu Breakfast                                Lunch                                     Supper Cranberry juice                    Beef broth                            Chicken broth Jell-O                                     Grape juice                           Apple juice Coffee or tea                        Jell-O                                      Popsicle                                                Coffee or tea                        Coffee or tea       Oral Hygiene is also important to reduce your risk of infection.                                    Remember - BRUSH YOUR TEETH THE MORNING OF SURGERY WITH YOUR REGULAR TOOTHPASTE   Do NOT smoke after Midnight   Take these medicines the morning of surgery with A SIP OF WATER: Amlodipine (Norvasc), Cetirizine (Zyrtec), Omeprazole (Prilosec)                              You may not have any metal on your body including hair pins, jewelry, and body piercing             Do not wear make-up, lotions, powders, perfumes/cologne, or deodorant  Do not wear nail polish including gel and S&S, artificial/acrylic nails, or any other type of covering on natural nails including finger and toenails. If you have artificial nails, gel coating, etc. that needs to be removed by a nail salon please have this removed prior to surgery or surgery may need to be canceled/ delayed if the surgeon/ anesthesia feels like they are unable to be safely monitored.               Men may shave face and neck.  Do not bring valuables to the hospital. [place].   Contacts, dentures or  bridgework may not be worn into surgery.   Bring small overnight bag day of surgery.    Special Instructions: Bring a copy of your healthcare power of attorney and living will documents         the day of surgery if you haven't scanned them in before.              Please read over the following fact sheets you were given: IF YOU HAVE QUESTIONS ABOUT YOUR PRE OP INSTRUCTIONS PLEASE CALL [phone]   [place] - Preparing for Surgery Before surgery, you can play an important role.  Because skin is not sterile, your skin needs to be as free of germs as possible.  You can reduce the number of germs on your skin by washing with CHG (chlorahexidine gluconate) soap before surgery.  CHG is an antiseptic cleaner which kills germs and bonds with the skin to continue killing germs even after washing. Please DO NOT use if you have an allergy to CHG or antibacterial soaps.  If your skin becomes reddened/irritated stop using the CHG and inform your nurse when you arrive at Short Stay. Do not shave (including legs and underarms) for at least 48 hours prior to the first CHG shower.  You may shave your face/neck.  Please follow these instructions carefully:  1.  Shower with CHG Soap the night before surgery and the  morning of surgery.  2.  If you choose to wash your hair, wash your hair first as usual with your normal  shampoo.  3.  After you shampoo, rinse your hair and body thoroughly to remove the shampoo.                             4.  Use CHG as you would any other liquid soap.  You can apply chg directly to the skin and wash.  Gently with a scrungie or clean washcloth.  5.  Apply the CHG Soap to your body ONLY FROM THE NECK DOWN.   Do   not use on face/ open                           Wound or open sores. Avoid contact with eyes, ears mouth and   genitals (private parts).                       Wash face,  Genitals (private parts) with your normal soap.             6.  Wash thoroughly, paying special  attention to the area where your    surgery  will be performed.  7.  Thoroughly rinse your body with warm water from the neck down.  8.  DO NOT shower/wash with your normal soap after using and rinsing off the CHG Soap.                9.  Pat yourself dry with a clean towel.            10.  Wear clean pajamas.            11.  Place clean sheets on your bed the night of your first  shower and do not  sleep with pets. Day of Surgery : Do not apply any lotions/deodorants the morning of surgery.  Please wear clean clothes to the hospital/surgery center.  FAILURE TO FOLLOW THESE INSTRUCTIONS MAY RESULT IN THE CANCELLATION OF YOUR SURGERY  PATIENT SIGNATURE_________________________________  NURSE SIGNATURE__________________________________  ________________________________________________________________________

## 2021-02-17 DIAGNOSIS — D49512 Neoplasm of unspecified behavior of left kidney: Secondary | ICD-10-CM | POA: Diagnosis not present

## 2021-02-21 DIAGNOSIS — R1319 Other dysphagia: Secondary | ICD-10-CM | POA: Diagnosis not present

## 2021-02-21 DIAGNOSIS — K2270 Barrett's esophagus without dysplasia: Secondary | ICD-10-CM | POA: Diagnosis not present

## 2021-02-21 DIAGNOSIS — R634 Abnormal weight loss: Secondary | ICD-10-CM | POA: Diagnosis not present

## 2021-02-21 DIAGNOSIS — Z800 Family history of malignant neoplasm of digestive organs: Secondary | ICD-10-CM | POA: Diagnosis not present

## 2021-02-21 DIAGNOSIS — Z86010 Personal history of colonic polyps: Secondary | ICD-10-CM | POA: Diagnosis not present

## 2021-02-21 DIAGNOSIS — K5901 Slow transit constipation: Secondary | ICD-10-CM | POA: Diagnosis not present

## 2021-02-22 ENCOUNTER — Other Ambulatory Visit: Payer: Medicare Other

## 2021-02-23 ENCOUNTER — Other Ambulatory Visit: Payer: Self-pay

## 2021-02-23 ENCOUNTER — Other Ambulatory Visit
Admission: RE | Admit: 2021-02-23 | Discharge: 2021-02-23 | Disposition: A | Discharge status: Home / Self Care | Payer: Medicare Other | Source: Ambulatory Visit | Attending: Urology | Admitting: Urology

## 2021-02-23 DIAGNOSIS — Z20822 Contact with and (suspected) exposure to covid-19: Secondary | ICD-10-CM | POA: Diagnosis not present

## 2021-02-23 DIAGNOSIS — Z01812 Encounter for preprocedural laboratory examination: Secondary | ICD-10-CM | POA: Diagnosis not present

## 2021-02-23 LAB — SARS CORONAVIRUS 2 (TAT 6-24 HRS): SARS Coronavirus 2: NEGATIVE

## 2021-02-24 DIAGNOSIS — Z4802 Encounter for removal of sutures: Secondary | ICD-10-CM | POA: Diagnosis not present

## 2021-02-25 ENCOUNTER — Encounter (HOSPITAL_COMMUNITY): Payer: Self-pay | Admitting: Urology

## 2021-02-25 ENCOUNTER — Encounter (HOSPITAL_COMMUNITY)
Admission: RE | Disposition: A | Discharge status: Home / Self Care | Payer: Self-pay | Source: Home / Self Care | Attending: Urology

## 2021-02-25 ENCOUNTER — Observation Stay (HOSPITAL_COMMUNITY)
Admission: RE | Admit: 2021-02-25 | Discharge: 2021-02-27 | Disposition: A | Discharge status: Home / Self Care | Payer: Medicare Other | Attending: Urology | Admitting: Urology

## 2021-02-25 ENCOUNTER — Ambulatory Visit (HOSPITAL_COMMUNITY): Payer: Medicare Other | Admitting: Anesthesiology

## 2021-02-25 ENCOUNTER — Ambulatory Visit (HOSPITAL_COMMUNITY): Payer: Medicare Other | Admitting: Physician Assistant

## 2021-02-25 DIAGNOSIS — Z951 Presence of aortocoronary bypass graft: Secondary | ICD-10-CM | POA: Insufficient documentation

## 2021-02-25 DIAGNOSIS — N2889 Other specified disorders of kidney and ureter: Secondary | ICD-10-CM | POA: Diagnosis present

## 2021-02-25 DIAGNOSIS — C642 Malignant neoplasm of left kidney, except renal pelvis: Secondary | ICD-10-CM | POA: Diagnosis not present

## 2021-02-25 DIAGNOSIS — I4891 Unspecified atrial fibrillation: Secondary | ICD-10-CM | POA: Diagnosis not present

## 2021-02-25 DIAGNOSIS — D49512 Neoplasm of unspecified behavior of left kidney: Secondary | ICD-10-CM | POA: Diagnosis present

## 2021-02-25 DIAGNOSIS — Z85820 Personal history of malignant melanoma of skin: Secondary | ICD-10-CM | POA: Insufficient documentation

## 2021-02-25 DIAGNOSIS — I2510 Atherosclerotic heart disease of native coronary artery without angina pectoris: Secondary | ICD-10-CM | POA: Diagnosis not present

## 2021-02-25 DIAGNOSIS — I10 Essential (primary) hypertension: Secondary | ICD-10-CM | POA: Diagnosis not present

## 2021-02-25 DIAGNOSIS — Z87891 Personal history of nicotine dependence: Secondary | ICD-10-CM | POA: Diagnosis not present

## 2021-02-25 DIAGNOSIS — Z79899 Other long term (current) drug therapy: Secondary | ICD-10-CM | POA: Insufficient documentation

## 2021-02-25 DIAGNOSIS — Z7982 Long term (current) use of aspirin: Secondary | ICD-10-CM | POA: Insufficient documentation

## 2021-02-25 DIAGNOSIS — E7800 Pure hypercholesterolemia, unspecified: Secondary | ICD-10-CM | POA: Diagnosis not present

## 2021-02-25 DIAGNOSIS — G3184 Mild cognitive impairment, so stated: Secondary | ICD-10-CM | POA: Diagnosis present

## 2021-02-25 HISTORY — PX: ROBOTIC ASSITED PARTIAL NEPHRECTOMY: SHX6087

## 2021-02-25 LAB — TYPE AND SCREEN
ABO/RH(D): A POS
Antibody Screen: NEGATIVE

## 2021-02-25 LAB — HEMOGLOBIN AND HEMATOCRIT, BLOOD
HCT: 37 % — ABNORMAL LOW (ref 39.0–52.0)
Hemoglobin: 12.5 g/dL — ABNORMAL LOW (ref 13.0–17.0)

## 2021-02-25 SURGERY — NEPHRECTOMY, PARTIAL, ROBOT-ASSISTED
Anesthesia: General | Site: Abdomen | Laterality: Left

## 2021-02-25 MED ORDER — ORAL CARE MOUTH RINSE
15.0000 mL | Freq: Once | OROMUCOSAL | Status: AC
Start: 2021-02-25 — End: 2021-02-25

## 2021-02-25 MED ORDER — CHLORHEXIDINE GLUCONATE 0.12 % MT SOLN
15.0000 mL | Freq: Once | OROMUCOSAL | Status: AC
  Administered 2021-02-25: 15 mL via OROMUCOSAL

## 2021-02-25 MED ORDER — PHENYLEPHRINE HCL (PRESSORS) 10 MG/ML IV SOLN
INTRAVENOUS | Status: AC
  Filled 2021-02-25: qty 1

## 2021-02-25 MED ORDER — LACTATED RINGERS IV SOLN: INTRAVENOUS | Status: DC

## 2021-02-25 MED ORDER — OXYCODONE HCL 5 MG PO TABS: 5.0000 mg | ORAL_TABLET | ORAL | Status: DC | PRN

## 2021-02-25 MED ORDER — PROPOFOL 10 MG/ML IV BOLUS
INTRAVENOUS | Status: AC
  Filled 2021-02-25: qty 20

## 2021-02-25 MED ORDER — ONDANSETRON HCL 4 MG/2ML IJ SOLN
INTRAMUSCULAR | Status: AC
  Filled 2021-02-25: qty 2

## 2021-02-25 MED ORDER — DEXAMETHASONE SODIUM PHOSPHATE 10 MG/ML IJ SOLN
INTRAMUSCULAR | Status: AC
  Filled 2021-02-25: qty 1

## 2021-02-25 MED ORDER — LORATADINE 10 MG PO TABS
10.0000 mg | ORAL_TABLET | Freq: Every day | ORAL | Status: DC
  Administered 2021-02-26: 10 mg via ORAL
  Filled 2021-02-25: qty 1

## 2021-02-25 MED ORDER — ALBUMIN HUMAN 5 % IV SOLN: INTRAVENOUS | Status: DC | PRN

## 2021-02-25 MED ORDER — "VISTASEAL 4 ML SINGLE DOSE KIT "
4.0000 mL | PACK | Freq: Once | CUTANEOUS | Status: AC
  Administered 2021-02-25: 4 mL via TOPICAL
  Filled 2021-02-25: qty 4

## 2021-02-25 MED ORDER — BELLADONNA ALKALOIDS-OPIUM 16.2-60 MG RE SUPP: 1.0000 | Freq: Four times a day (QID) | RECTAL | Status: DC | PRN

## 2021-02-25 MED ORDER — LIDOCAINE 2% (20 MG/ML) 5 ML SYRINGE
INTRAMUSCULAR | Status: DC | PRN
Start: 2021-02-25 — End: 2021-02-25
  Administered 2021-02-25: 100 mg via INTRAVENOUS
  Administered 2021-02-25 (×2): 50 mg via INTRAVENOUS

## 2021-02-25 MED ORDER — BUPIVACAINE LIPOSOME 1.3 % IJ SUSP
20.0000 mL | Freq: Once | INTRAMUSCULAR | Status: AC
  Administered 2021-02-25: 20 mL
  Filled 2021-02-25: qty 20

## 2021-02-25 MED ORDER — LACTATED RINGERS IV SOLN: INTRAVENOUS | Status: DC | PRN

## 2021-02-25 MED ORDER — DEXAMETHASONE SODIUM PHOSPHATE 10 MG/ML IJ SOLN
INTRAMUSCULAR | Status: DC | PRN
  Administered 2021-02-25: 8 mg via INTRAVENOUS

## 2021-02-25 MED ORDER — FENTANYL CITRATE (PF) 100 MCG/2ML IJ SOLN
25.0000 ug | INTRAMUSCULAR | Status: DC | PRN
  Administered 2021-02-25: 50 ug via INTRAVENOUS

## 2021-02-25 MED ORDER — ROCURONIUM BROMIDE 10 MG/ML (PF) SYRINGE
PREFILLED_SYRINGE | INTRAVENOUS | Status: AC
  Filled 2021-02-25: qty 10

## 2021-02-25 MED ORDER — ONDANSETRON HCL 4 MG/2ML IJ SOLN
INTRAMUSCULAR | Status: DC | PRN
  Administered 2021-02-25: 4 mg via INTRAVENOUS

## 2021-02-25 MED ORDER — DIPHENHYDRAMINE HCL 50 MG/ML IJ SOLN: 12.5000 mg | Freq: Four times a day (QID) | INTRAMUSCULAR | Status: DC | PRN

## 2021-02-25 MED ORDER — ATORVASTATIN CALCIUM 10 MG PO TABS
10.0000 mg | ORAL_TABLET | Freq: Every day | ORAL | Status: DC
  Administered 2021-02-26: 10 mg via ORAL
  Filled 2021-02-25: qty 1

## 2021-02-25 MED ORDER — FENTANYL CITRATE (PF) 100 MCG/2ML IJ SOLN
INTRAMUSCULAR | Status: AC
  Filled 2021-02-25: qty 2

## 2021-02-25 MED ORDER — DEXTROSE-NACL 5-0.45 % IV SOLN: INTRAVENOUS | Status: DC

## 2021-02-25 MED ORDER — TRAMADOL HCL 50 MG PO TABS: 50.0000 mg | ORAL_TABLET | Freq: Four times a day (QID) | ORAL | 0 refills | Status: DC | PRN

## 2021-02-25 MED ORDER — CEFAZOLIN SODIUM-DEXTROSE 1-4 GM/50ML-% IV SOLN
1.0000 g | Freq: Three times a day (TID) | INTRAVENOUS | Status: AC
  Administered 2021-02-25 – 2021-02-26 (×2): 1 g via INTRAVENOUS
  Filled 2021-02-25 (×2): qty 50

## 2021-02-25 MED ORDER — PROMETHAZINE HCL 12.5 MG PO TABS: 12.5000 mg | ORAL_TABLET | ORAL | 0 refills | Status: DC | PRN

## 2021-02-25 MED ORDER — OXYCODONE HCL 5 MG PO TABS: 5.0000 mg | ORAL_TABLET | Freq: Once | ORAL | Status: DC | PRN

## 2021-02-25 MED ORDER — ACETAMINOPHEN 500 MG PO TABS
1000.0000 mg | ORAL_TABLET | Freq: Once | ORAL | Status: AC
  Administered 2021-02-25: 1000 mg via ORAL
  Filled 2021-02-25: qty 2

## 2021-02-25 MED ORDER — PROMETHAZINE HCL 25 MG/ML IJ SOLN
6.2500 mg | INTRAMUSCULAR | Status: DC | PRN
Start: 2021-02-25 — End: 2021-02-25

## 2021-02-25 MED ORDER — DOCUSATE SODIUM 100 MG PO CAPS: 100.0000 mg | ORAL_CAPSULE | Freq: Two times a day (BID) | ORAL | Status: DC

## 2021-02-25 MED ORDER — SUGAMMADEX SODIUM 200 MG/2ML IV SOLN
INTRAVENOUS | Status: DC | PRN
  Administered 2021-02-25 (×2): 200 mg via INTRAVENOUS

## 2021-02-25 MED ORDER — FENTANYL CITRATE (PF) 100 MCG/2ML IJ SOLN
INTRAMUSCULAR | Status: DC | PRN
  Administered 2021-02-25: 50 ug via INTRAVENOUS
  Administered 2021-02-25: 100 ug via INTRAVENOUS
  Administered 2021-02-25: 50 ug via INTRAVENOUS

## 2021-02-25 MED ORDER — LIDOCAINE 2% (20 MG/ML) 5 ML SYRINGE
INTRAMUSCULAR | Status: AC
  Filled 2021-02-25: qty 5

## 2021-02-25 MED ORDER — PHENYLEPHRINE HCL-NACL 10-0.9 MG/250ML-% IV SOLN
INTRAVENOUS | Status: DC | PRN
  Administered 2021-02-25: 40 ug/min via INTRAVENOUS

## 2021-02-25 MED ORDER — SODIUM CHLORIDE 0.9 % IV SOLN
2.0000 g | INTRAVENOUS | Status: AC
  Administered 2021-02-25: 2 g via INTRAVENOUS
  Filled 2021-02-25 (×2): qty 2

## 2021-02-25 MED ORDER — SODIUM CHLORIDE (PF) 0.9 % IJ SOLN
INTRAMUSCULAR | Status: DC | PRN
  Administered 2021-02-25: 10 mL

## 2021-02-25 MED ORDER — DIPHENHYDRAMINE HCL 12.5 MG/5ML PO ELIX: 12.5000 mg | ORAL_SOLUTION | Freq: Four times a day (QID) | ORAL | Status: DC | PRN

## 2021-02-25 MED ORDER — HYDROMORPHONE HCL 1 MG/ML IJ SOLN
0.5000 mg | INTRAMUSCULAR | Status: DC | PRN
  Administered 2021-02-26 (×2): 0.5 mg via INTRAVENOUS
  Filled 2021-02-25 (×2): qty 1

## 2021-02-25 MED ORDER — BACITRACIN-NEOMYCIN-POLYMYXIN 400-5-5000 EX OINT
1.0000 | TOPICAL_OINTMENT | Freq: Three times a day (TID) | CUTANEOUS | Status: DC | PRN
Start: 2021-02-25 — End: 2021-02-27

## 2021-02-25 MED ORDER — ONDANSETRON HCL 4 MG/2ML IJ SOLN: 4.0000 mg | INTRAMUSCULAR | Status: DC | PRN

## 2021-02-25 MED ORDER — ROCURONIUM BROMIDE 10 MG/ML (PF) SYRINGE
PREFILLED_SYRINGE | INTRAVENOUS | Status: DC | PRN
  Administered 2021-02-25: 30 mg via INTRAVENOUS
  Administered 2021-02-25: 60 mg via INTRAVENOUS
  Administered 2021-02-25: 40 mg via INTRAVENOUS
  Administered 2021-02-25: 30 mg via INTRAVENOUS

## 2021-02-25 MED ORDER — NITROGLYCERIN 0.4 MG SL SUBL: 0.4000 mg | SUBLINGUAL_TABLET | SUBLINGUAL | Status: DC | PRN

## 2021-02-25 MED ORDER — DOCUSATE SODIUM 100 MG PO CAPS
100.0000 mg | ORAL_CAPSULE | Freq: Two times a day (BID) | ORAL | Status: DC
Start: 2021-02-25 — End: 2021-02-27
  Administered 2021-02-25 – 2021-02-26 (×3): 100 mg via ORAL
  Filled 2021-02-25 (×3): qty 1

## 2021-02-25 MED ORDER — PROPOFOL 10 MG/ML IV BOLUS
INTRAVENOUS | Status: DC | PRN
  Administered 2021-02-25: 150 mg via INTRAVENOUS
  Administered 2021-02-25: 50 mg via INTRAVENOUS

## 2021-02-25 MED ORDER — AMLODIPINE BESYLATE 10 MG PO TABS
10.0000 mg | ORAL_TABLET | Freq: Every day | ORAL | Status: DC
  Administered 2021-02-26: 10 mg via ORAL
  Filled 2021-02-25: qty 1

## 2021-02-25 MED ORDER — OXYCODONE HCL 5 MG/5ML PO SOLN: 5.0000 mg | Freq: Once | ORAL | Status: DC | PRN

## 2021-02-25 MED ORDER — ACETAMINOPHEN 500 MG PO TABS
1000.0000 mg | ORAL_TABLET | Freq: Four times a day (QID) | ORAL | Status: AC
Start: 2021-02-25 — End: 2021-02-26
  Administered 2021-02-25 – 2021-02-26 (×4): 1000 mg via ORAL
  Filled 2021-02-25 (×4): qty 2

## 2021-02-25 MED ORDER — SODIUM CHLORIDE (PF) 0.9 % IJ SOLN
INTRAMUSCULAR | Status: AC
  Filled 2021-02-25: qty 20

## 2021-02-25 MED ORDER — PANTOPRAZOLE SODIUM 40 MG PO TBEC
80.0000 mg | DELAYED_RELEASE_TABLET | Freq: Every day | ORAL | Status: DC
  Administered 2021-02-26: 80 mg via ORAL
  Filled 2021-02-25: qty 2

## 2021-02-25 SURGICAL SUPPLY — 75 items
ADH SKN CLS APL DERMABOND .7 (GAUZE/BANDAGES/DRESSINGS) ×1
APL LAPSCP 35 DL APL RGD (MISCELLANEOUS) ×1
APL PRP STRL LF DISP 70% ISPRP (MISCELLANEOUS) ×1
APL SRG 32X5 SNPLK LF DISP (MISCELLANEOUS)
APPLICATOR VISTASEAL 35 (MISCELLANEOUS) ×2 IMPLANT
BAG COUNTER SPONGE SURGICOUNT (BAG) ×2 IMPLANT
BAG SPEC RTRVL LRG 6X4 10 (ENDOMECHANICALS) ×1
BAG SPNG CNTER NS LX DISP (BAG) ×1
CHLORAPREP W/TINT 26 (MISCELLANEOUS) ×2 IMPLANT
CLIP LIGATING HEMO LOK XL GOLD (MISCELLANEOUS) IMPLANT
CLIP SUT LAPRA TY ABSORB (SUTURE) ×8 IMPLANT
CLIP VESOLOCK LG 6/CT PURPLE (CLIP) ×2 IMPLANT
CLIP VESOLOCK MED LG 6/CT (CLIP) ×6 IMPLANT
COVER SURGICAL LIGHT HANDLE (MISCELLANEOUS) ×2 IMPLANT
COVER TIP SHEARS 8 DVNC (MISCELLANEOUS) ×1 IMPLANT
COVER TIP SHEARS 8MM DA VINCI (MISCELLANEOUS) ×2
CUTTER ECHEON FLEX ENDO 45 340 (ENDOMECHANICALS) IMPLANT
DECANTER SPIKE VIAL GLASS SM (MISCELLANEOUS) IMPLANT
DERMABOND ADVANCED (GAUZE/BANDAGES/DRESSINGS) ×1
DERMABOND ADVANCED .7 DNX12 (GAUZE/BANDAGES/DRESSINGS) ×1 IMPLANT
DRAIN CHANNEL 15F RND FF 3/16 (WOUND CARE) ×2 IMPLANT
DRAPE ARM DVNC X/XI (DISPOSABLE) ×4 IMPLANT
DRAPE COLUMN DVNC XI (DISPOSABLE) ×1 IMPLANT
DRAPE DA VINCI XI ARM (DISPOSABLE) ×8
DRAPE DA VINCI XI COLUMN (DISPOSABLE) ×2
DRAPE INCISE IOBAN 66X45 STRL (DRAPES) ×2 IMPLANT
DRAPE SHEET LG 3/4 BI-LAMINATE (DRAPES) ×2 IMPLANT
ELECT PENCIL ROCKER SW 15FT (MISCELLANEOUS) ×2 IMPLANT
ELECT REM PT RETURN 15FT ADLT (MISCELLANEOUS) ×2 IMPLANT
EVACUATOR SILICONE 100CC (DRAIN) ×2 IMPLANT
GAUZE 4X4 16PLY ~~LOC~~+RFID DBL (SPONGE) ×2 IMPLANT
GLOVE SRG 8 PF TXTR STRL LF DI (GLOVE) ×2 IMPLANT
GLOVE SURG ENC MOIS LTX SZ6.5 (GLOVE) ×2 IMPLANT
GLOVE SURG ENC TEXT LTX SZ7.5 (GLOVE) ×4 IMPLANT
GLOVE SURG UNDER POLY LF SZ8 (GLOVE) ×4
GOWN STRL REUS W/TWL LRG LVL3 (GOWN DISPOSABLE) ×2 IMPLANT
GOWN STRL REUS W/TWL XL LVL3 (GOWN DISPOSABLE) ×4 IMPLANT
HEMOSTAT SURGICEL 4X8 (HEMOSTASIS) ×2 IMPLANT
HOLDER FOLEY CATH W/STRAP (MISCELLANEOUS) ×2 IMPLANT
IRRIG SUCT STRYKERFLOW 2 WTIP (MISCELLANEOUS) ×2
IRRIGATION SUCT STRKRFLW 2 WTP (MISCELLANEOUS) ×1 IMPLANT
KIT BASIN OR (CUSTOM PROCEDURE TRAY) ×2 IMPLANT
KIT TURNOVER KIT A (KITS) ×2 IMPLANT
LOOP VESSEL MAXI BLUE (MISCELLANEOUS) IMPLANT
MARKER SKIN DUAL TIP RULER LAB (MISCELLANEOUS) ×2 IMPLANT
NEEDLE INSUFFLATION 14GA 120MM (NEEDLE) ×2 IMPLANT
POUCH SPECIMEN RETRIEVAL 10MM (ENDOMECHANICALS) ×2 IMPLANT
PROTECTOR NERVE ULNAR (MISCELLANEOUS) ×4 IMPLANT
SCISSORS LAP 5X45 EPIX DISP (ENDOMECHANICALS) ×2 IMPLANT
SEAL CANN UNIV 5-8 DVNC XI (MISCELLANEOUS) ×4 IMPLANT
SEAL XI 5MM-8MM UNIVERSAL (MISCELLANEOUS) ×8
SEALANT SURGICAL APPL DUAL CAN (MISCELLANEOUS) IMPLANT
SET TUBE SMOKE EVAC HIGH FLOW (TUBING) ×2 IMPLANT
SOLUTION ELECTROLUBE (MISCELLANEOUS) ×2 IMPLANT
STAPLE RELOAD 45 WHT (STAPLE) IMPLANT
STAPLE RELOAD 45MM WHITE (STAPLE)
SUT ETHILON 2 0 PSLX (SUTURE) ×2 IMPLANT
SUT MNCRL AB 4-0 PS2 18 (SUTURE) ×4 IMPLANT
SUT PDS AB 0 CT1 36 (SUTURE) IMPLANT
SUT V-LOC BARB 180 2/0GR6 GS22 (SUTURE) ×2
SUT VIC AB 1 CT1 36 (SUTURE) ×12 IMPLANT
SUT VIC AB 3-0 SH 27 (SUTURE) ×4
SUT VIC AB 3-0 SH 27XBRD (SUTURE) ×2 IMPLANT
SUT VICRYL 0 UR6 27IN ABS (SUTURE) IMPLANT
SUT VLOC BARB 180 ABS3/0GR12 (SUTURE) ×4
SUTURE V-LC BRB 180 2/0GR6GS22 (SUTURE) ×1 IMPLANT
SUTURE VLOC BRB 180 ABS3/0GR12 (SUTURE) ×2 IMPLANT
TOWEL OR 17X26 10 PK STRL BLUE (TOWEL DISPOSABLE) ×2 IMPLANT
TOWEL OR NON WOVEN STRL DISP B (DISPOSABLE) ×2 IMPLANT
TRAY FOLEY MTR SLVR 16FR STAT (SET/KITS/TRAYS/PACK) ×2 IMPLANT
TRAY LAPAROSCOPIC (CUSTOM PROCEDURE TRAY) ×2 IMPLANT
TROCAR BLADELESS OPT 5 100 (ENDOMECHANICALS) IMPLANT
TROCAR UNIVERSAL OPT 12M 100M (ENDOMECHANICALS) IMPLANT
TROCAR XCEL 12X100 BLDLESS (ENDOMECHANICALS) ×2 IMPLANT
WATER STERILE IRR 1000ML POUR (IV SOLUTION) ×2 IMPLANT

## 2021-02-25 NOTE — Transfer of Care (Signed)
Immediate Anesthesia Transfer of Care Note  Patient: Zachary Hall  Procedure(s) Performed: XI ROBOTIC ASSITED PARTIAL NEPHRECTOMY (Left: Abdomen)  Patient Location: PACU  Anesthesia Type:General  Level of Consciousness: drowsy  Airway & Oxygen Therapy: Patient Spontanous Breathing and Patient connected to face mask oxygen  Post-op Assessment: Report given to RN and Post -op Vital signs reviewed and stable  Post vital signs: Reviewed and stable  Last Vitals:  Vitals Value Taken Time  BP 149/71 02/25/21 1630  Temp    Pulse 70 02/25/21 1634  Resp 10 02/25/21 1634  SpO2 100 % 02/25/21 1634  Vitals shown include unvalidated device data.  Last Pain:  Vitals:   02/25/21 1128  PainSc: 0-No pain         Complications: No notable events documented.

## 2021-02-25 NOTE — Op Note (Signed)
Operative Note  Preoperative diagnosis:  1.  3.9 cm left renal mass  Postoperative diagnosis: 1.  3.9 cm left renal mass  Procedure(s): 1.  Robot-assisted laparoscopic left partial nephrectomy 2.  Intraoperative ultrasound of single retroperitoneal organ  Surgeon: Ellison Hughs, MD  Assistants: Debbrah Alar, PA-C  An assistant was required for this surgical procedure.  The duties of the assistant included but were not limited to suctioning, passing suture, camera manipulation, retraction.  This procedure would not be able to be performed without an Environmental consultant.   Anesthesia:  General  Complications:  None  EBL: 60 mL  Specimens: 1.  Left renal mass  Drains/Catheters: 1.  Foley catheter 2.  Left lower quadrant JP drain  Intraoperative findings:   Intraoperative ultrasound revealed an echogenic mass emanating from the anterior portion of the mid pole of the left kidney The collecting system was entered in 4 separate areas with each opening measuring approximately 5 mm each.  The areas that were violated were reapproximated intraoperatively   Indication:  Zachary Hall is a 86 y.o. male with a solid and enhancing 3.9 cm left renal mass with features concerning for renal cell carcinoma.  He has been consented for the above procedures, voices understanding and wishes to proceed.  Description of procedure:  After informed consent was obtained, the patient was brought to the operating room and general endotracheal anesthesia was administered.  The patient was then placed in the right lateral decubitus position and prepped and draped in usual sterile fashion.  A timeout was performed.  An 8 mm incision was then made lateral to the left rectus muscle at the level of the left 12th rib.  Abdominal access was obtained via a Veress needle.  The abdominal cavity was then insufflated up to 15 mmHg.  An 8 mm port was then introduced into the abdominal cavity.  Inspection of the port entry  site by the robotic camera revealed no adjacent organ injury.  We then placed 3 additional 8 mm robotic ports to triangulate the left renal hilum.  A 12 mm assistant port was then placed between the carmera port and 3rd robotic arm.  The white line of Toldt along the descending colon was incised sharply and the colon, along with its mesocolonic fat, was reflected medially until the aorta was identified.  We then made a small window adjacent to the lower pole of the left kidney, identifying the left psoas muscle, left ureter and left gonadal vein.  The left ureter and gonadal vein were then reflected anteriorly allowing [place] to then incised the perihilar attachments using electrocautery.  We encountered a small lumbar vein adjacent to the insertion of the left gonadal vein into the left renal vein.  This lumbar vein was ligated with hemo-lock clips in 2 places and incised sharply.  This provided [place] excellent exposure to the left renal hilum.  The perilymphatic tissue surrounding the left renal artery was carefully dissected away, creating a window to place a bulldog clamp later in the procedure.  The anterior portion of Gerota's fascia was incised, allowing reflection the perinephric fat medially and laterally until there was adequate exposure of the anterior mid-pole left renal mass.  Intraoperative ultrasound confirmed the heterogenous echogenicity of the lesion compared to the remainder of the renal parenchyma and allowed identification of the depth/borders of the mass, which were demarcated using electrocautery along the renal capsule.  We then exposed the left renal artery and placed a bulldog clamp, marking warm ischemia  time.  The left kidney immediately became ischemic and pale in appearance.  The left renal mass was then sharply excised with minimal blood loss.  After the mass was free, it was placed in the left upper quadrant to be retrieved later on during the operation.    There were a total of 4  areas where the collecting system was entered during excision of the mass with each measuring approximately 5 mm.  Theses areas were reapproximated with interrupted 3-0 vicryl suture in a figure of eight fashion.  The renorrhaphy was then performed using a 3-0 V-lock in the deep layer of the renal parenchyma and then a series of horizontal mattress sutures were used to reapproximate the renal capsule using interupted 1-0 Vicryl suture along with hemo-lock clips act as a buttress against the renal capsule.  Once adequate tension was placed on the renorrhaphy sutures and the repair appeared hemostatic, we then removed the bulldog clamp marking the end of warm ischemia time at 32 minutes.  There did not appear to be any obvious bleeding around the renal hilum nor surrounding our repair.  The incised Gerota's fascia overlying the mass was then reapproximated using a running 2-0 V lock suture.  The mass was then placed in an Endo Catch bag.  A 15 [place] JP drain was then placed intra-abdominal he through the left lower quadrant robotic port and sutured in place using a nylon suture.  The robot was then de-docked and the camera was then reinserted into the assistant port. Laparoscopic graspers were then used to grab the string of the Endo Catch bag, which was brought out through the 12 mm assistant port.  The abdomen was then desufflated and all ports were removed.  The assistant port incision was then extended approximately 1 cm and the left renal mass, within the Endo Catch bag, was removed and sent to pathology for permanent section.  The fascia within the assistant port incision was then reapproximated using a 0 Vicryl suture.  The remainder of the incisions were then closed using 4-0 Monocryl and dressed appropriately.  Patient tolerated the procedure well and was transferred to the postanesthesia unit in stable condition.  Plan: Monitor on the floor overnight.  Check JP creatinine on postop day 1 and remove if  consistent with serum.

## 2021-02-25 NOTE — Anesthesia Procedure Notes (Addendum)
Procedure Name: Intubation Date/Time: 02/25/2021 12:38 PM Performed by: Milford Cage, CRNA Pre-anesthesia Checklist: Patient identified, Emergency Drugs available, Suction available and Patient being monitored Patient Re-evaluated:Patient Re-evaluated prior to induction Oxygen Delivery Method: Circle system utilized Preoxygenation: Pre-oxygenation with 100% oxygen Induction Type: IV induction Ventilation: Mask ventilation without difficulty and Oral airway inserted - appropriate to patient size Laryngoscope Size: 2, Glidescope and 4 Grade View: Grade I Tube type: Oral Tube size: 7.5 mm Number of attempts: 1 Airway Equipment and Method: Rigid stylet and Video-laryngoscopy Placement Confirmation: ETT inserted through vocal cords under direct vision, positive ETCO2 and breath sounds checked- equal and bilateral Secured at: 24 cm Tube secured with: Tape Dental Injury: Teeth and Oropharynx as per pre-operative assessment  Difficulty Due To: Difficulty was anticipated and Difficult Airway- due to anterior larynx Future Recommendations: Recommend- induction with short-acting agent, and alternative techniques readily available

## 2021-02-25 NOTE — H&P (Signed)
PRE-OP H&P  Office Visit Report     02/17/2021   --------------------------------------------------------------------------------   Zachary Hall  MRN: [id number]  DOB: Feb 06, 1935, 86 year old Male  PRIMARY CARE:    REFERRING:  Zachary Cerniglia A. Lovena Neighbours, MD  PROVIDER:  Ellison Hall, M.D.  LOCATION:  [facility] [phone]     --------------------------------------------------------------------------------   CC/HPI: Left renal mass   HPI: [name] is an 86 year old male with a history of erectile dysfunction (s/p IPP in 1993 with subsequent replacement in 2004). s/p right IHR.   The patient had a CT abdomen and pelvis without contrast on 11/19/2020 for unintentional weight loss. He was found to have a solid appearing left mid pole renal mass measuring approximately 4.6 cm in greatest dimension. He was also noted to have a fairly large right-sided peripelvic cyst. He subsequently had a renal ultrasound on 11/23/2020 that confirmed the presence of a solid left renal mass with features concerning for renal cell carcinoma.   Last serum creatinine: 1.14 with EGFR in the 50-60 range.   -Previously smoked 1 ppd for the 30 years. Quit in the 1960s.  -No personal/family history of GU malignancies  -No neurologic symptoms such as changes in vision, headache or peripheral neuropathy   02/17/21: The patient is here today for a routine follow-up leading up to surgery on 7/29. Review of his MRI from 01/01/2021 confirmed the presence of a solid and enhancing left interpolar renal mass measuring approximately 3.1 cm in greatest dimension. Single artery and vein. The patient and his wife met with Zachary Cadet, MD with interventional radiology, but ultimately decided to proceed with extirpative management of his left renal mass. The patient is doing well denies any interval episodes of shortness of breath, chest pain, flank pain, hematuria or dysuria.     ALLERGIES: Lisinopril  TABS - Other Reaction, unknown    MEDICATIONS: AmLODIPine Besylate 10 MG Oral Tablet Oral  Aspirin 81 MG TABS Oral  Atorvastatin Calcium 10 mg tablet Oral  Cherry Concentrate Oral Concentrate Oral  Hydrochlorothiazide 12.5 mg capsule Oral  Losartan Potassium 100 mg tablet 1 tablet PO Daily  Multiple Vitamin TABS Oral  Omega-3 CAPS Oral  ZyrTEC Allergy 10 MG Oral Tablet Oral     Notes: MEDICATIONS UPDATED TODAY.AT    GU PSH: Insertion IPP - 2003       PSH Notes: Melanoma Skin Cancer removed from left scapula (01/2021)   NON-GU PSH: Anesth, Repair Of Hernia - 2018 CABG (coronary artery bypass grafting) - 2014 Hemorrhoidectomy     GU PMH: Left renal neoplasm - 11/26/2020 ED due to arterial insufficiency - 04/22/2020, - 2018, - 2018, Erectile dysfunction due to arterial insufficiency, - 2015 Low back pain - 2020 Suprapubic pain - 2020 BPH w/LUTS - 2018, Benign localized hyperplasia of prostate with urinary obstruction, - 2017 Unil Inguinal Hernia W/obst, W/O gang, non-recurrent - 2018, (Acute), Right, Refer to [facility]. Instructed if pain becomes intractable, f/c, or vomiting needs to go immediately to ER. Recommend possibly using truss for support and void extreme heavy lifting. May use PRN Tylenol/NSAID for discomfort , - 2018 BPH w/o LUTS, BPH (benign prostatic hyperplasia) - 2017 Peyronies Disease, Peyronie's disease - 2017, Peyronie's syndrome, - 2017 Nocturia, Nocturia - 2016    NON-GU PMH: Encounter for general adult medical examination without abnormal findings, Encounter for preventive health examination - 2016 Gout, Gout - 2014 Personal history of other diseases of the circulatory system, History of hypertension - 2014, History  of cardiac disorder, - 2014 Personal history of other endocrine, nutritional and metabolic disease, History of hypercholesterolemia - 2014    FAMILY HISTORY: Bone Cancer - Mother Colon Cancer - Mother Family Health Status Number - Runs In  Family Father Deceased At Age83 ___ - Runs In Family Mother Deceased At Age 52 from diabetic complicati - Runs In Family   SOCIAL HISTORY: Marital Status: Married Preferred Language: English; Ethnicity: Not Hispanic Or Latino; Race: White Current Smoking Status: Patient does not smoke anymore. Has not smoked since 12/08/1959. Smoked for 4 years. Smoked 1 pack per day.   Tobacco Use Assessment Completed: Used Tobacco in last 30 days? Does not use smokeless tobacco. Drinks 6 drinks per day.  Does not use drugs. Does not drink caffeine.    REVIEW OF SYSTEMS:    GU Review Male:   Patient denies frequent urination, hard to postpone urination, burning/ pain with urination, get up at night to urinate, leakage of urine, stream starts and stops, trouble starting your stream, have to strain to urinate , erection problems, and penile pain.  Gastrointestinal (Upper):   Patient denies nausea, vomiting, and indigestion/ heartburn.  Gastrointestinal (Lower):   Patient denies diarrhea and constipation.  Constitutional:   Patient denies fever, night sweats, weight loss, and fatigue.  Skin:   Patient denies skin rash/ lesion and itching.  Eyes:   Patient denies blurred vision and double vision.  Ears/ Nose/ Throat:   Patient denies sore throat and sinus problems.  Hematologic/Lymphatic:   Patient denies swollen glands and easy bruising.  Cardiovascular:   Patient denies leg swelling and chest pains.  Respiratory:   Patient denies cough and shortness of breath.  Endocrine:   Patient denies excessive thirst.  Musculoskeletal:   Patient denies back pain and joint pain.  Neurological:   Patient denies headaches and dizziness.  Psychologic:   Patient denies depression and anxiety.   VITAL SIGNS:      02/17/2021 09:47 AM  Weight 160 lb / 72.57 kg  Height 70 in / 177.8 cm  BP 127/63 mmHg  Pulse 47 /min  Temperature 97.1 F / 36.1 C  BMI 23.0 kg/m   MULTI-SYSTEM PHYSICAL EXAMINATION:    Constitutional:  Well-nourished. No physical deformities. Normally developed. Good grooming.  Neck: Neck symmetrical, not swollen. Normal tracheal position.  Respiratory: No labored breathing, no use of accessory muscles.   Cardiovascular: Normal temperature, normal extremity pulses, no swelling, no varicosities.  Neurologic / Psychiatric: Oriented to time, oriented to place, oriented to person. No depression, no anxiety, no agitation.  Gastrointestinal: No mass, no tenderness, no rigidity, non obese abdomen.  Ears, Nose, Mouth, and Throat: Hard of hearing.   Musculoskeletal: Normal gait and station of head and neck.     Complexity of Data:  Records Review:   Previous Patient Records  X-Ray Review: MRI Abdomen: Reviewed Films. Reviewed Report. Discussed With Patient.     10/15/12  PSA  Total PSA 3.64    Notes:                     CLINICAL DATA: Further evaluation of left renal lesion.     EXAM:  MRI ABDOMEN WITHOUT AND WITH CONTRAST     TECHNIQUE:  Multiplanar multisequence MR imaging of the abdomen was performed  both before and after the administration of intravenous contrast.     CONTRAST: 8m MULTIHANCE GADOBENATE DIMEGLUMINE 529 MG/ML IV SOLN     COMPARISON: CT November 19, 2020 and  renal ultrasound November 23, 2020     FINDINGS:  Lower chest: No acute abnormality on these limited views.     Hepatobiliary: No hepatic steatosis. No suspicious hepatic lesion.  Pharyngeal cap on the gallbladder which is otherwise unremarkable.  No biliary ductal dilation.     Pancreas: Within normal limits.     Spleen: Within normal limits.     Adrenals/Urinary Tract: Bilateral adrenal glands are unremarkable.     Heterogeneous solid LEFT interpolar renal mass measuring 3.1 x 2.5 x  2.9 cm on image 12/6 and 10/2. The lesion is intrinsically isodose  slightly hypodense to background renal parenchyma and intensely  enhancing on postcontrast imaging without evidence of signal loss on  out of phase or fat  suppression sequences. Nonenhancing 8 mm left  upper pole renal cyst. No hydronephrosis.     Similar appearance of the large elongated right peripelvic cyst  which displaces elements of the collecting system and renal  vasculature at the hilum. 1.2 cm right upper pole renal cyst and a  smaller exophytic right interpolar renal cyst. No hydronephrosis.     Stomach/Bowel: Periampullary duodenal diverticulum otherwise the  visualized portions of the GI tract within the abdomen are  unremarkable.     Vascular/Lymphatic: No evidence of renal tumor in vein. No abdominal  aortic aneurysm. Short segment curvilinear filling defect within the  infrarenal abdominal aorta on image 54/10 which is nonaneurysmal and  favored to represent a short segment dissection vs penetrating  atheromatous ulcer, which in retrospect was subtly evident on prior  non contrasted CT of the abdomen pelvis and appears unchanged. No  pathologically enlarged abdominal lymph nodes.     Other: No abdominal ascites.     Musculoskeletal: No suspicious bone lesions identified.     IMPRESSION:  1. Heterogeneous solid enhancing 3.1 cm LEFT interpolar renal mass,  suspicious for a renal cell carcinoma. No evidence of renal tumor in  vein or abdominal metastases.  2. Similar appearance of the large elongated right peripelvic cyst  without obvious complicating feature.  3. Stable short segment curvilinear filling defect within the  infrarenal abdominal aorta is favored to represent a short segment  dissection vs. Penetrating atheromatous ulcer, without aneurysmal  dilation of the aorta. For which continued attention/monitoring on  follow-up imaging in this oncology patient is sufficient        Electronically Signed  By: Dahlia Bailiff MD  On: 01/01/2021 12:55      PROCEDURES:          Urinalysis w/Scope Dipstick Dipstick Cont'd Micro  Color: Amber Bilirubin: Neg mg/dL WBC/hpf: 6 - 10/hpf  Appearance: Cloudy Ketones:  Trace mg/dL RBC/hpf: NS (Not Seen)  Specific Gravity: 1.025 Blood: Neg ery/uL Bacteria: NS (Not Seen)  pH: 5.5 Protein: Trace mg/dL Cystals: NS (Not Seen)  Glucose: Neg mg/dL Urobilinogen: 1.0 mg/dL Casts: Granular    Nitrites: Neg Trichomonas: Not Present    Leukocyte Esterase: Neg leu/uL Mucous: Present      Epithelial Cells: NS (Not Seen)      Yeast: NS (Not Seen)      Sperm: Not Present    Notes: Negative, Verified by repeat analysis    ASSESSMENT:      ICD-10 Details  1 GU:   Left renal neoplasm - D49.512 Left, Chronic, Stable   PLAN:           Orders Labs Urine Culture          Schedule Return Visit/Planned Activity: Keep Scheduled  Appointment - Schedule Surgery          Document Letter(s):  Created for Patient: Clinical Summary         Notes:   -I personally reviewed imaging results and films with the patient. We discussed that the mass in question has features concerning for malignancy. I explained the natural history of presumed renal cell carcinoma. I reviewed the AUA guidelines for evaluation and treatment of the small renal mass. The options of active surveillance, in situ tumor ablation, partial and radical nephrectomy was discussed. The risks of robotic LEFT partial nephrectomy were discussed in detail including but not limited to: negative pathology, open conversion, completion nephrectomy, infection of the urinary tract/skin/abdominal cavity, VTE, MI/CVA, lymphatic leak, injury to adjacent solid/hollow viscus organs, bleeding requiring a blood transfusion, catastrophic bleeding, hernia formation, need for postoperative angioembolization, urinary leak requiring stent/drain, and other imponderables.

## 2021-02-25 NOTE — Anesthesia Procedure Notes (Addendum)
Arterial Line Insertion Start/End7/29/2022 12:40 PM, 02/25/2021 12:50 PM Performed by: Brennan Bailey, MD, anesthesiologist  Patient location: OR. Preanesthetic checklist: patient identified, IV checked, site marked, risks and benefits discussed, surgical consent, monitors and equipment checked, pre-op evaluation, timeout performed and anesthesia consent Right, radial was placed Catheter size: 20 G Hand hygiene performed , maximum sterile barriers used  and Seldinger technique used Allen's test indicative of satisfactory collateral circulation Attempts: 2 Procedure performed without using ultrasound guided technique. Following insertion, Biopatch and dressing applied. Post procedure assessment: normal and unchanged  Post procedure complications: unsuccessful attempts. Patient tolerated the procedure well with no immediate complications.

## 2021-02-25 NOTE — Discharge Instructions (Signed)

## 2021-02-26 ENCOUNTER — Other Ambulatory Visit: Payer: Self-pay

## 2021-02-26 DIAGNOSIS — Z79899 Other long term (current) drug therapy: Secondary | ICD-10-CM | POA: Diagnosis not present

## 2021-02-26 DIAGNOSIS — Z85820 Personal history of malignant melanoma of skin: Secondary | ICD-10-CM | POA: Diagnosis not present

## 2021-02-26 DIAGNOSIS — Z951 Presence of aortocoronary bypass graft: Secondary | ICD-10-CM | POA: Diagnosis not present

## 2021-02-26 DIAGNOSIS — I10 Essential (primary) hypertension: Secondary | ICD-10-CM | POA: Diagnosis not present

## 2021-02-26 DIAGNOSIS — Z7982 Long term (current) use of aspirin: Secondary | ICD-10-CM | POA: Diagnosis not present

## 2021-02-26 DIAGNOSIS — Z87891 Personal history of nicotine dependence: Secondary | ICD-10-CM | POA: Diagnosis not present

## 2021-02-26 DIAGNOSIS — I2510 Atherosclerotic heart disease of native coronary artery without angina pectoris: Secondary | ICD-10-CM | POA: Diagnosis not present

## 2021-02-26 DIAGNOSIS — I4891 Unspecified atrial fibrillation: Secondary | ICD-10-CM | POA: Diagnosis not present

## 2021-02-26 DIAGNOSIS — C642 Malignant neoplasm of left kidney, except renal pelvis: Secondary | ICD-10-CM | POA: Diagnosis not present

## 2021-02-26 LAB — BASIC METABOLIC PANEL
Anion gap: 7 (ref 5–15)
BUN: 32 mg/dL — ABNORMAL HIGH (ref 8–23)
CO2: 26 mmol/L (ref 22–32)
Calcium: 8.6 mg/dL — ABNORMAL LOW (ref 8.9–10.3)
Chloride: 99 mmol/L (ref 98–111)
Creatinine, Ser: 1.44 mg/dL — ABNORMAL HIGH (ref 0.61–1.24)
GFR, Estimated: 48 mL/min — ABNORMAL LOW (ref >60)
Glucose, Bld: 179 mg/dL — ABNORMAL HIGH (ref 70–99)
Potassium: 3.4 mmol/L — ABNORMAL LOW (ref 3.5–5.1)
Sodium: 132 mmol/L — ABNORMAL LOW (ref 135–145)

## 2021-02-26 LAB — CREATININE, FLUID (PLEURAL, PERITONEAL, JP DRAINAGE): Creat, Fluid: 1.4 mg/dL

## 2021-02-26 LAB — HEMOGLOBIN AND HEMATOCRIT, BLOOD
HCT: 32.2 % — ABNORMAL LOW (ref 39.0–52.0)
Hemoglobin: 11.4 g/dL — ABNORMAL LOW (ref 13.0–17.0)

## 2021-02-26 MED ORDER — ENSURE ENLIVE PO LIQD: 237.0000 mL | Freq: Two times a day (BID) | ORAL | Status: DC

## 2021-02-26 MED ORDER — CHLORHEXIDINE GLUCONATE CLOTH 2 % EX PADS: 6.0000 | MEDICATED_PAD | Freq: Every day | CUTANEOUS | Status: DC

## 2021-02-26 MED ORDER — KCL IN DEXTROSE-NACL 20-5-0.45 MEQ/L-%-% IV SOLN
INTRAVENOUS | Status: DC
  Filled 2021-02-26 (×4): qty 1000

## 2021-02-26 NOTE — Plan of Care (Signed)
Patient ambulatory in hallway, tolerating regular diet.  Wife at bedside.

## 2021-02-26 NOTE — Progress Notes (Signed)
Patient has not voided since foley removed at 9 a.m., bladder scan performed read as 159 mls.  Patient indicates no urge to void.  Will continue to monitor.

## 2021-02-26 NOTE — Progress Notes (Signed)
Patient had an unmeasured void in toilet, bladder scan post void read as approximately 100 mls.  Will continue to monitor.

## 2021-02-26 NOTE — Progress Notes (Signed)
1 Day Post-Op  Subjective: [name] is POD 1 from a left PN.  He had some agitation this morning but improved with dilaudid.  He has no pain or other associated complaints.   He had 391m of JP drainage over the last 24hrs.   His Cr is up slightly to 1.44 and his Hgb is down slightly to 11.4.   Urine is clear in foley tubing.   He has passed gas.   ROS:  Review of Systems  Constitutional:  Negative for chills and fever.  Respiratory:  Negative for shortness of breath.   Cardiovascular:  Negative for chest pain.   Anti-infectives: Anti-infectives (From admission, onward)    Start     Dose/Rate Route Frequency Ordered Stop   02/25/21 2100  ceFAZolin (ANCEF) IVPB 1 g/50 mL premix        1 g 100 mL/hr over 30 Minutes Intravenous Every 8 hours 02/25/21 1858 02/26/21 0509   02/25/21 1035  ceFAZolin (ANCEF) 2 g in sodium chloride 0.9 % 100 mL IVPB        2 g 200 mL/hr over 30 Minutes Intravenous 30 min pre-op 02/25/21 1035 02/25/21 1300       Current Facility-Administered Medications  Medication Dose Route Frequency Provider Last Rate Last Admin   acetaminophen (TYLENOL) tablet 1,000 mg  1,000 mg Oral Q6H Dancy, Amanda, PA-C   1,000 mg at 02/26/21 [id number]  amLODipine (NORVASC) tablet 10 mg  10 mg Oral Daily DDebbrah Alar PA-C       atorvastatin (LIPITOR) tablet 10 mg  10 mg Oral q1800 Dancy, Amanda, PA-C       belladonna-opium (B&O) suppository 16.2-'60mg'$   1 suppository Rectal Q6H PRN DDebbrah Alar PA-C       Chlorhexidine Gluconate Cloth 2 % PADS 6 each  6 each Topical Daily Winter, Christopher Aaron, MD       dextrose 5 %-0.45 % sodium chloride infusion   Intravenous Continuous DDebbrah Alar PA-C 100 mL/hr at 02/26/21 [id number] Bag at 02/26/21 0611   diphenhydrAMINE (BENADRYL) injection 12.5 mg  12.5 mg Intravenous Q6H PRN DDebbrah Alar PA-C       Or   diphenhydrAMINE (BENADRYL) 12.5 MG/5ML elixir 12.5 mg  12.5 mg Oral Q6H PRN Dancy, Amanda, PA-C       docusate sodium (COLACE)  capsule 100 mg  100 mg Oral BID DDebbrah Alar PA-C   100 mg at 02/25/21 2339   HYDROmorphone (DILAUDID) injection 0.5-1 mg  0.5-1 mg Intravenous Q2H PRN DDebbrah Alar PA-C   0.5 mg at 02/26/21 0437   loratadine (CLARITIN) tablet 10 mg  10 mg Oral Daily DDebbrah Alar PA-C       neomycin-bacitracin-polymyxin (NEOSPORIN) ointment packet 1 application  1 application Topical TID PRN DDebbrah Alar PA-C       nitroGLYCERIN (NITROSTAT) SL tablet 0.4 mg  0.4 mg Sublingual Q5 min PRN Dancy, Amanda, PA-C       ondansetron (ZOFRAN) injection 4 mg  4 mg Intravenous Q4H PRN DDebbrah Alar PA-C       oxyCODONE (Oxy IR/ROXICODONE) immediate release tablet 5 mg  5 mg Oral Q4H PRN Dancy, Amanda, PA-C       pantoprazole (PROTONIX) EC tablet 80 mg  80 mg Oral Daily Dancy, Amanda, PA-C         Objective: Vital signs in last 24 hours: Temp:  [96.9 F (36.1 C)-98.8 F (37.1 C)] 98.2 F (36.8 C) (07/30 0552) Pulse Rate:  [56-70] 56 (07/30 0552) Resp:  [  9-16] 12 (07/30 0552) BP: (120-156)/(54-82) 135/74 (07/30 0552) SpO2:  [89 %-100 %] 96 % (07/30 0552) Arterial Line BP: (113-142)/(37-71) 119/37 (07/29 1730) Weight:  [72.3 kg-72.8 kg] 72.3 kg (07/29 1857)  Intake/Output from previous day: 07/29 0701 - 07/30 0700 In: 2894.3 [I.V.:2444.3; IV Piggyback:450] Out: 1330 [Urine:970; Drains:300; Blood:60] Intake/Output this shift: Total I/O In: -  Out: 40 [Drains:40]   Physical Exam Vitals reviewed.  Constitutional:      Appearance: Normal appearance.  Cardiovascular:     Rate and Rhythm: Normal rate and regular rhythm.     Heart sounds: Normal heart sounds.  Pulmonary:     Effort: Pulmonary effort is normal. No respiratory distress.     Breath sounds: Rales (mild at left base.) present.  Abdominal:     General: Abdomen is flat. Bowel sounds are normal.     Palpations: Abdomen is soft.     Comments: Wounds intact without erythema. JP with serosanguinous drainage.  Neurological:     Mental  Status: He is alert.    Lab Results:  Recent Labs    02/25/21 1655 02/26/21 0545  HGB 12.5* 11.4*  HCT 37.0* 32.2*   BMET Recent Labs    02/26/21 0545  NA 132*  K 3.4*  CL 99  CO2 26  GLUCOSE 179*  BUN 32*  CREATININE 1.44*  CALCIUM 8.6*   PT/INR No results for input(s): LABPROT, INR in the last 72 hours. ABG No results for input(s): PHART, HCO3 in the last 72 hours.  Invalid input(s): PCO2, PO2  Studies/Results: No results found.   Assessment and Plan: Left renal mass s/p left PN.  He is doing well but has had some confusion.   I will get his foley out and allow ambulation.    JP fluid sent for Cr.   Advance diet as tolerated  AKI.  His Cr is up slightly post op.  Will continue to monitor.  Acute blood loss anemia.  His Hgb is down slightly.  Will continue to monitor.         LOS: 0 days    [name] 02/26/2021 [id number] Patient ID: Zachary Hall, male   DOB: 14-Sep-1934, 86 y.o.   MRN: [id number]

## 2021-02-27 DIAGNOSIS — Z87891 Personal history of nicotine dependence: Secondary | ICD-10-CM | POA: Diagnosis not present

## 2021-02-27 DIAGNOSIS — C642 Malignant neoplasm of left kidney, except renal pelvis: Secondary | ICD-10-CM | POA: Diagnosis not present

## 2021-02-27 DIAGNOSIS — I2510 Atherosclerotic heart disease of native coronary artery without angina pectoris: Secondary | ICD-10-CM | POA: Diagnosis not present

## 2021-02-27 DIAGNOSIS — Z7982 Long term (current) use of aspirin: Secondary | ICD-10-CM | POA: Diagnosis not present

## 2021-02-27 DIAGNOSIS — I4891 Unspecified atrial fibrillation: Secondary | ICD-10-CM | POA: Diagnosis not present

## 2021-02-27 DIAGNOSIS — I10 Essential (primary) hypertension: Secondary | ICD-10-CM | POA: Diagnosis not present

## 2021-02-27 DIAGNOSIS — Z951 Presence of aortocoronary bypass graft: Secondary | ICD-10-CM | POA: Diagnosis not present

## 2021-02-27 DIAGNOSIS — Z79899 Other long term (current) drug therapy: Secondary | ICD-10-CM | POA: Diagnosis not present

## 2021-02-27 DIAGNOSIS — Z85820 Personal history of malignant melanoma of skin: Secondary | ICD-10-CM | POA: Diagnosis not present

## 2021-02-27 LAB — BASIC METABOLIC PANEL
Anion gap: 6 (ref 5–15)
BUN: 23 mg/dL (ref 8–23)
CO2: 27 mmol/L (ref 22–32)
Calcium: 8.3 mg/dL — ABNORMAL LOW (ref 8.9–10.3)
Chloride: 97 mmol/L — ABNORMAL LOW (ref 98–111)
Creatinine, Ser: 1.17 mg/dL (ref 0.61–1.24)
GFR, Estimated: >60 mL/min (ref >60)
Glucose, Bld: 124 mg/dL — ABNORMAL HIGH (ref 70–99)
Potassium: 3.5 mmol/L (ref 3.5–5.1)
Sodium: 130 mmol/L — ABNORMAL LOW (ref 135–145)

## 2021-02-27 LAB — HEMOGLOBIN AND HEMATOCRIT, BLOOD
HCT: 32.2 % — ABNORMAL LOW (ref 39.0–52.0)
Hemoglobin: 11.4 g/dL — ABNORMAL LOW (ref 13.0–17.0)

## 2021-02-27 NOTE — Discharge Summary (Signed)
Physician Discharge Summary  Patient ID: Zachary Hall MRN: [id number] DOB/AGE: 03-11-1935 86 y.o.  Admit date: 02/25/2021 Discharge date: 02/27/2021  Admission Diagnoses:  Renal mass  Discharge Diagnoses:  Principal Problem:   Renal mass Active Problems:   Mild cognitive impairment   Past Medical History:  Diagnosis Date   Arthritis    Balance problem    Barrett esophagus    Bradycardia    Coronary artery disease    GERD (gastroesophageal reflux disease)    Gout    History of atrial fibrillation    Previously on amiodarone   Hx of CABG    Hypercholesterolemia    Hypertension    Left renal mass    Melanoma ([place])    Left shoulder blade   Nocturia     Surgeries: Procedure(s): XI ROBOTIC ASSITED PARTIAL NEPHRECTOMY on 02/25/2021   Consultants (if any): Treatment Team:  Ceasar Mons, MD  Discharged Condition: Improved  Hospital Course: Zachary Hall is an 86 y.o. male who was admitted 02/25/2021 with a diagnosis of Renal mass and went to the operating room on 02/25/2021 and underwent the above named procedures.  He did well postoperatively.   The foley was removed on POD #1 and the JP on POD#2. He had a mild ABL anemia with a hgb of 11.4 at d/c.  He had some sundowning overnight.    He was given perioperative antibiotics:  Anti-infectives (From admission, onward)    Start     Dose/Rate Route Frequency Ordered Stop   02/25/21 2100  ceFAZolin (ANCEF) IVPB 1 g/50 mL premix        1 g 100 mL/hr over 30 Minutes Intravenous Every 8 hours 02/25/21 1858 02/26/21 0509   02/25/21 1035  ceFAZolin (ANCEF) 2 g in sodium chloride 0.9 % 100 mL IVPB        2 g 200 mL/hr over 30 Minutes Intravenous 30 min pre-op 02/25/21 1035 02/25/21 1300     .  He was given sequential compression devices and early ambulation for DVT prophylaxis.  He benefited maximally from the hospital stay and there were no complications.    Recent vital signs:  Vitals:   02/26/21 1951  02/27/21 0543  BP: (!) 151/65 (!) 165/77  Pulse: (!) 57 64  Resp: 16 16  Temp: 98.3 F (36.8 C) 97.8 F (36.6 C)  SpO2: 99% 96%    Recent laboratory studies:  Lab Results  Component Value Date   HGB 11.4 (L) 02/27/2021   HGB 11.4 (L) 02/26/2021   HGB 12.5 (L) 02/25/2021   Lab Results  Component Value Date   WBC 8.6 02/16/2021   PLT 176 02/16/2021   Lab Results  Component Value Date   INR 1.32 02/07/2010   Lab Results  Component Value Date   NA 130 (L) 02/27/2021   K 3.5 02/27/2021   CL 97 (L) 02/27/2021   CO2 27 02/27/2021   BUN 23 02/27/2021   CREATININE 1.17 02/27/2021   GLUCOSE 124 (H) 02/27/2021    Discharge Medications:   Allergies as of 02/27/2021       Reactions   Lisinopril Other (See Comments)   Cough        Medication List     STOP taking these medications    aspirin 81 MG tablet   Centrum Adults Tabs   CHERRY PO   DIGESTIVE HEALTH PROBIOTIC PO   naproxen sodium 220 MG tablet Commonly known as: ALEVE  TAKE these medications    amLODipine 10 MG tablet Commonly known as: NORVASC Take 1 tablet (10 mg total) by mouth daily.   ammonium lactate 12 % cream Commonly known as: AMLACTIN Apply 1 g topically as needed for dry skin.   cetirizine 10 MG tablet Commonly known as: ZYRTEC Take 10 mg by mouth daily.   docusate sodium 100 MG capsule Commonly known as: COLACE Take 1 capsule (100 mg total) by mouth 2 (two) times daily.   losartan 50 MG tablet Commonly known as: COZAAR Take 1 tablet (50 mg total) by mouth daily.   mupirocin ointment 2 % Commonly known as: BACTROBAN Apply 1 application topically 2 (two) times daily.   omeprazole 40 MG capsule Commonly known as: PRILOSEC Take 40 mg by mouth daily before breakfast.   promethazine 12.5 MG tablet Commonly known as: PHENERGAN Take 1 tablet (12.5 mg total) by mouth every 4 (four) hours as needed for nausea or vomiting.   traMADol 50 MG tablet Commonly known as:  Ultram Take 1-2 tablets (50-100 mg total) by mouth every 6 (six) hours as needed for moderate pain or severe pain.       ASK your doctor about these medications    atorvastatin 10 MG tablet Commonly known as: LIPITOR Take 1 tablet (10 mg total) by mouth daily at 6 PM.   hydrochlorothiazide 12.5 MG capsule Commonly known as: MICROZIDE TAKE 1 CAPSULE BY MOUTH EVERY DAY   nitroGLYCERIN 0.4 MG SL tablet Commonly known as: NITROSTAT *PLACE 1 TABLET UNDER TONGUE EVERY 5 MINS., UP TO 3 DOSES AS NEEDED FOR CHEST PAIN*        Diagnostic Studies: No results found.  Disposition: Discharge disposition: 01-Home or Self Care      Discharge Instructions     Discontinue IV   Complete by: As directed         Follow-up Information     Winter, Christopher Aaron, MD Follow up on 03/11/2021.   Specialty: Urology Why: at 8:15 Contact information: [address] [place] [address] [phone]                  Signed: [name] 02/27/2021, 8:10 AM

## 2021-02-27 NOTE — Plan of Care (Signed)
Discharge instructions reviewed with patients wife priscilla, questions answered, verbalized understanding.  Patient transported to main entrance via wheelchair to be taken home by wife.

## 2021-02-27 NOTE — Progress Notes (Signed)
2 Days Post-Op  Subjective: [name] is POD 2 from a left PN.  He continues to have some sundowning at night.  He has no pain or other associated complaints.   He had 161m of JP drainage over the last 24hrs and the fluid Cr is 1.4.   His Cr is back down to 1.17.   He is voiding well, ambulating and tolerating a diet.    ROS:  Review of Systems  Constitutional:  Negative for chills and fever.  Respiratory:  Negative for shortness of breath.   Cardiovascular:  Negative for chest pain.   Anti-infectives: Anti-infectives (From admission, onward)    Start     Dose/Rate Route Frequency Ordered Stop   02/25/21 2100  ceFAZolin (ANCEF) IVPB 1 g/50 mL premix        1 g 100 mL/hr over 30 Minutes Intravenous Every 8 hours 02/25/21 1858 02/26/21 0509   02/25/21 1035  ceFAZolin (ANCEF) 2 g in sodium chloride 0.9 % 100 mL IVPB        2 g 200 mL/hr over 30 Minutes Intravenous 30 min pre-op 02/25/21 1035 02/25/21 1300       Current Facility-Administered Medications  Medication Dose Route Frequency Provider Last Rate Last Admin   amLODipine (NORVASC) tablet 10 mg  10 mg Oral Daily DDebbrah Alar PA-C   10 mg at 02/26/21 1223   atorvastatin (LIPITOR) tablet 10 mg  10 mg Oral q1800 DDebbrah Alar PA-C   10 mg at 02/26/21 1743   belladonna-opium (B&O) suppository 16.2-'60mg'$   1 suppository Rectal Q6H PRN DDebbrah Alar PA-C       dextrose 5 % and 0.45 % NaCl with KCl 20 mEq/L infusion   Intravenous Continuous WIrine Seal MD 75 mL/hr at 02/27/21 0619 New Bag at 02/27/21 0619   diphenhydrAMINE (BENADRYL) injection 12.5 mg  12.5 mg Intravenous Q6H PRN DDebbrah Alar PA-C       Or   diphenhydrAMINE (BENADRYL) 12.5 MG/5ML elixir 12.5 mg  12.5 mg Oral Q6H PRN DDebbrah Alar PA-C       docusate sodium (COLACE) capsule 100 mg  100 mg Oral BID DDebbrah Alar PA-C   100 mg at 02/26/21 2107   feeding supplement (ENSURE ENLIVE / ENSURE PLUS) liquid 237 mL  237 mL Oral BID BM WCeasar Mons MD        HYDROmorphone (DILAUDID) injection 0.5-1 mg  0.5-1 mg Intravenous Q2H PRN DDebbrah Alar PA-C   0.5 mg at 02/26/21 2108   loratadine (CLARITIN) tablet 10 mg  10 mg Oral Daily DDebbrah Alar PA-C   10 mg at 02/26/21 1223   neomycin-bacitracin-polymyxin (NEOSPORIN) ointment packet 1 application  1 application Topical TID PRN DDebbrah Alar PA-C       nitroGLYCERIN (NITROSTAT) SL tablet 0.4 mg  0.4 mg Sublingual Q5 min PRN DDebbrah Alar PA-C       ondansetron (ZOFRAN) injection 4 mg  4 mg Intravenous Q4H PRN DDebbrah Alar PA-C       oxyCODONE (Oxy IR/ROXICODONE) immediate release tablet 5 mg  5 mg Oral Q4H PRN Dancy, Amanda, PA-C       pantoprazole (PROTONIX) EC tablet 80 mg  80 mg Oral Daily DDebbrah Alar PA-C   80 mg at 02/26/21 1223     Objective: Vital signs in last 24 hours: Temp:  [97.7 F (36.5 C)-98.3 F (36.8 C)] 97.8 F (36.6 C) (07/31 0543) Pulse Rate:  [56-64] 64 (07/31 0543) Resp:  [16] 16 (07/31 0543) BP: (149-165)/(63-77) 165/77 (07/31  0543) SpO2:  [96 %-100 %] 96 % (07/31 0543)  Intake/Output from previous day: 07/30 0701 - 07/31 0700 In: 1876.8 [P.O.:480; I.V.:1396.8] Out: 125 [Drains:125] Intake/Output this shift: No intake/output data recorded.   Physical Exam Vitals reviewed.  Constitutional:      Appearance: Normal appearance.  Pulmonary:     Effort: Pulmonary effort is normal. No respiratory distress.     Breath sounds: Rales: mild at left base..  Abdominal:     General: Abdomen is flat. Bowel sounds are normal.     Palpations: Abdomen is soft.     Comments: Wounds intact without erythema. JP with serosanguinous drainage.  Neurological:     Mental Status: He is alert.    Lab Results:  Recent Labs    02/26/21 0545 02/27/21 0534  HGB 11.4* 11.4*  HCT 32.2* 32.2*    BMET Recent Labs    02/26/21 0545 02/27/21 0534  NA 132* 130*  K 3.4* 3.5  CL 99 97*  CO2 26 27  GLUCOSE 179* 124*  BUN 32* 23  CREATININE 1.44* 1.17  CALCIUM 8.6* 8.3*     PT/INR No results for input(s): LABPROT, INR in the last 72 hours. ABG No results for input(s): PHART, HCO3 in the last 72 hours.  Invalid input(s): PCO2, PO2  Studies/Results: No results found.   Assessment and Plan: Left renal mass s/p left PN.  He is doing well.  I will have the JP removed and discharge him to home.  AKI.  His Cr is back down to 1.17 from 1.44.   Acute blood loss anemia.  His Hgb is stable at 11.4 overnight.         LOS: 0 days    [name] 02/27/2021 [phone] Patient ID: Zachary Hall, male   DOB: Dec 10, 1934, 86 y.o.   MRN: [id number]

## 2021-02-28 ENCOUNTER — Encounter (HOSPITAL_COMMUNITY): Payer: Self-pay | Admitting: Urology

## 2021-02-28 NOTE — Anesthesia Postprocedure Evaluation (Signed)
Anesthesia Post Note  Patient: Zachary Hall  Procedure(s) Performed: XI ROBOTIC ASSITED PARTIAL NEPHRECTOMY (Left: Abdomen)     Patient location during evaluation: PACU Anesthesia Type: General Level of consciousness: awake and alert Pain management: pain level controlled Vital Signs Assessment: post-procedure vital signs reviewed and stable Respiratory status: spontaneous breathing, nonlabored ventilation, respiratory function stable and patient connected to nasal cannula oxygen Cardiovascular status: blood pressure returned to baseline and stable Postop Assessment: no apparent nausea or vomiting Anesthetic complications: no   No notable events documented.  Last Vitals:  Vitals:   02/26/21 1951 02/27/21 0543  BP: (!) 151/65 (!) 165/77  Pulse: (!) 57 64  Resp: 16 16  Temp: 36.8 C 36.6 C  SpO2: 99% 96%    Last Pain:  Vitals:   02/27/21 0835  TempSrc:   PainSc: 1                  Tranisha Tissue L Rikki Trosper

## 2021-03-01 LAB — SURGICAL PATHOLOGY

## 2021-03-11 DIAGNOSIS — C642 Malignant neoplasm of left kidney, except renal pelvis: Secondary | ICD-10-CM | POA: Diagnosis not present

## 2021-03-11 DIAGNOSIS — Z85528 Personal history of other malignant neoplasm of kidney: Secondary | ICD-10-CM | POA: Diagnosis not present

## 2021-03-14 DIAGNOSIS — H6123 Impacted cerumen, bilateral: Secondary | ICD-10-CM | POA: Diagnosis not present

## 2021-03-14 DIAGNOSIS — H903 Sensorineural hearing loss, bilateral: Secondary | ICD-10-CM | POA: Diagnosis not present

## 2021-03-16 ENCOUNTER — Encounter: Payer: Self-pay | Admitting: Adult Health

## 2021-03-24 DIAGNOSIS — H35371 Puckering of macula, right eye: Secondary | ICD-10-CM | POA: Diagnosis not present

## 2021-03-24 DIAGNOSIS — H5213 Myopia, bilateral: Secondary | ICD-10-CM | POA: Diagnosis not present

## 2021-03-24 DIAGNOSIS — H52203 Unspecified astigmatism, bilateral: Secondary | ICD-10-CM | POA: Diagnosis not present

## 2021-03-24 DIAGNOSIS — H524 Presbyopia: Secondary | ICD-10-CM | POA: Diagnosis not present

## 2021-03-24 DIAGNOSIS — Z961 Presence of intraocular lens: Secondary | ICD-10-CM | POA: Diagnosis not present

## 2021-04-25 ENCOUNTER — Ambulatory Visit: Payer: Medicare Other | Admitting: Adult Health

## 2021-04-25 DIAGNOSIS — D49512 Neoplasm of unspecified behavior of left kidney: Secondary | ICD-10-CM | POA: Diagnosis not present

## 2021-05-09 DIAGNOSIS — L821 Other seborrheic keratosis: Secondary | ICD-10-CM | POA: Diagnosis not present

## 2021-05-09 DIAGNOSIS — K5901 Slow transit constipation: Secondary | ICD-10-CM | POA: Diagnosis not present

## 2021-05-09 DIAGNOSIS — K2270 Barrett's esophagus without dysplasia: Secondary | ICD-10-CM | POA: Diagnosis not present

## 2021-05-09 DIAGNOSIS — L570 Actinic keratosis: Secondary | ICD-10-CM | POA: Diagnosis not present

## 2021-05-09 DIAGNOSIS — L812 Freckles: Secondary | ICD-10-CM | POA: Diagnosis not present

## 2021-05-09 DIAGNOSIS — K219 Gastro-esophageal reflux disease without esophagitis: Secondary | ICD-10-CM | POA: Diagnosis not present

## 2021-05-09 DIAGNOSIS — Z85828 Personal history of other malignant neoplasm of skin: Secondary | ICD-10-CM | POA: Diagnosis not present

## 2021-05-09 DIAGNOSIS — Z85820 Personal history of malignant melanoma of skin: Secondary | ICD-10-CM | POA: Diagnosis not present

## 2021-06-01 ENCOUNTER — Ambulatory Visit: Payer: Medicare Other | Admitting: Adult Health

## 2021-06-21 NOTE — Progress Notes (Signed)
Cardiology Office Note   Date:  06/27/2021   ID:  Zachary Hall, DOB 22-Sep-1934, MRN [id number]  PCP:  None Cardiologist: Zachary Bethel [name] MD  Chief Complaint  Patient presents with   Coronary Artery Disease       History of Present Illness: Zachary Hall is a 86 y.o. male is seen for follow up CAD.  He has a history of known ischemic heart disease. He underwent coronary artery bypass graft surgery on 02/07/10 by Dr. Cyndia Bent. Last Myoview in February 2015 was normal with EF 66%.   His wife is concerned that he has more cognitive decline. Forgets things. Loses his hearing aids. He last saw neurology 3 years ago. MRI showed chronic small vessel ischemic changes but no acute findings.   He did undergo left partial nephrectomy for a renal mass on February 25, 2021. 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 Follow up CT and CXR planned.  He reports the surgery took a lot out of him. He has lost 16 lbs. According to his wife his memory is worse and his energy is not as good. Denies any chest pain or dyspnea. No edema or palpitations. No dizziness or syncope.    Past Medical History:  Diagnosis Date   Arthritis    Balance problem    Barrett esophagus    Bradycardia    Coronary artery disease    GERD (gastroesophageal reflux disease)    Gout    History of atrial fibrillation    Previously on amiodarone   Hx of CABG    Hypercholesterolemia    Hypertension    Left renal mass    Melanoma ([place])    Left shoulder blade   Nocturia     Past Surgical History:  Procedure Laterality Date   CARDIAC CATHETERIZATION  01/04/2010   COLONOSCOPY     CORONARY ARTERY BYPASS GRAFT  02/07/2010   LIMA to LAD, SVG to 2nd DX, SVG to OM   EYE SURGERY Bilateral    Cataract Extraction   HEMORRHOID SURGERY     x 2   HEMORRHOID SURGERY N/A 11/13/2012   Procedure: [place] Hemorrhoidectomy;  Surgeon: Merrie Roof, MD;  Location: [place];  Service: General;  Laterality: N/A;   INGUINAL  HERNIA REPAIR Right 01/22/2017   Procedure: RIGHT INGUINAL HERNIA REPAIR WITH MESH;  Surgeon: Armandina Gemma, MD;  Location: [place];  Service: General;  Laterality: Right;   INSERTION OF MESH Right 01/22/2017   Procedure: INSERTION OF MESH;  Surgeon: Armandina Gemma, MD;  Location: [facility];  Service: General;  Laterality: Right;   IR RADIOLOGIST EVAL & MGMT  01/06/2021   NASAL POLYP EXCISION     OTHER SURGICAL HISTORY  06/05/2002   Penile Implant   ROBOTIC ASSITED PARTIAL NEPHRECTOMY Left 02/25/2021   Procedure: XI ROBOTIC ASSITED PARTIAL NEPHRECTOMY;  Surgeon: Ceasar Mons, MD;  Location: [facility];  Service: Urology;  Laterality: Left;   UPPER GI ENDOSCOPY       Current Outpatient Medications  Medication Sig Dispense Refill   amLODipine (NORVASC) 10 MG tablet Take 1 tablet (10 mg total) by mouth daily. 90 tablet 3   ammonium lactate (AMLACTIN) 12 % cream Apply 1 g topically as needed for dry skin.     atorvastatin (LIPITOR) 10 MG tablet Take 1 tablet (10 mg total) by mouth daily at 6 PM. (Patient taking differently: Take 10 mg by mouth at bedtime.) 90 tablet 3   cetirizine (ZYRTEC) 10 MG  tablet Take 10 mg by mouth daily.     docusate sodium (COLACE) 100 MG capsule Take 1 capsule (100 mg total) by mouth 2 (two) times daily.     hydrochlorothiazide (MICROZIDE) 12.5 MG capsule TAKE 1 CAPSULE BY MOUTH EVERY DAY (Patient taking differently: Take 12.5 mg by mouth daily.) 90 capsule 3   losartan (COZAAR) 50 MG tablet Take 1 tablet (50 mg total) by mouth daily. 90 tablet 3   mupirocin ointment (BACTROBAN) 2 % Apply 1 application topically 2 (two) times daily.     nitroGLYCERIN (NITROSTAT) 0.4 MG SL tablet *PLACE 1 TABLET UNDER TONGUE EVERY 5 MINS., UP TO 3 DOSES AS NEEDED FOR CHEST PAIN* (Patient taking differently: Place 0.4 mg under the tongue every 5 (five) minutes as needed for chest pain.) 25 tablet 2   omeprazole (PRILOSEC) 40 MG capsule Take 40 mg by mouth daily before breakfast.      promethazine (PHENERGAN) 12.5 MG tablet Take 1 tablet (12.5 mg total) by mouth every 4 (four) hours as needed for nausea or vomiting. 15 tablet 0   traMADol (ULTRAM) 50 MG tablet Take 1-2 tablets (50-100 mg total) by mouth every 6 (six) hours as needed for moderate pain or severe pain. 20 tablet 0   No current facility-administered medications for this visit.    Allergies:   Lisinopril    Social History:  The patient  reports that he quit smoking about 60 years ago. His smoking use included cigarettes. He has quit using smokeless tobacco. He reports current alcohol use of about 4.0 standard drinks per week. He reports that he does not use drugs.   Family History:  The patient's family history includes Alcohol abuse in his father; Cancer in his mother; Colon cancer in his mother.    ROS:  Please see the history of present illness.   Otherwise, review of systems are positive for none.   All other systems are reviewed and negative.    PHYSICAL EXAM: VS:  BP 132/84   Pulse (!) 48   Resp (!) 97   Ht 5\' 9"  (1.753 m)   Wt 163 lb 9.6 oz (74.2 kg)   BMI 24.16 kg/m  , BMI Body mass index is 24.16 kg/m. GENERAL:  Well appearing elderly WM in NAD HEENT:  PERRL, EOMI, sclera are clear. Oropharynx is clear. NECK:  No jugular venous distention, carotid upstroke brisk and symmetric, no bruits, no thyromegaly or adenopathy LUNGS:  Clear to auscultation bilaterally CHEST:  Unremarkable HEART:  RRR,  PMI not displaced or sustained,S1 and S2 within normal limits, no S3, no S4: no clicks, no rubs, no murmurs ABD:  Soft, nontender. BS +, no masses or bruits. No hepatomegaly, no splenomegaly EXT:  2 + pulses throughout, no edema, no cyanosis no clubbing SKIN:  Warm and dry.  No rashes NEURO:  Alert and oriented x 3. Cranial nerves II through XII intact. PSYCH:  Cognitively intact  EKG:  EKG  Is  ordered today. Sinus brady rate 48. RBBB. LAD. I have personally reviewed and interpreted this study. No  change from prior.  Recent Labs: 10/01/2020: ALT 16 02/16/2021: Platelets 176 02/27/2021: BUN 23; Creatinine, Ser 1.17; Hemoglobin 11.4; Potassium 3.5; Sodium 130    Lipid Panel    Component Value Date/Time   CHOL 122 10/01/2020 0921   TRIG 83 10/01/2020 0921   HDL 44 10/01/2020 0921   CHOLHDL 2.8 10/01/2020 0921   CHOLHDL 2.7 10/16/2016 1443   VLDL 32 (H) 10/16/2016 1443  [place] 62 10/01/2020 0921    Dated 11/01/20: CMET normal.  Wt Readings from Last 3 Encounters:  06/27/21 163 lb 9.6 oz (74.2 kg)  02/25/21 159 lb 6.3 oz (72.3 kg)  02/16/21 160 lb 9.6 oz (72.8 kg)     ASSESSMENT AND PLAN:  1.  Ischemic heart disease status post CABG 2011. Patient is asymptomatic. Last Myoview in Feb. 2015 was normal. Continue current medical therapy.  2.  RBBB with sinus bradycardia. Chronic- asymptomatic. Will monitor. On no rate slowing therapy 3. Hypercholesterolemia. Excellent control on statin.  4. Remote history of paroxysmal atrial fibrillation- no recurrence. 5. HTN controlled. 6.  Memory disorder followed by Neurology. Encourage follow up with Neuro. 7. Renal cell carcinoma. S/p partial nephrectomy. Encourage increased calorie intake with good protein source.  Current medicines are reviewed at length with the patient today.  The patient does not have concerns regarding medicines.  The following changes have been made:  no change  Labs/ tests ordered today include:   No orders of the defined types were placed in this encounter.   Disposition: Continue current medication.  Follow up 6 months with lab  Signed, Dakota Stangl [name] MD, [facility]   06/27/2021 9:12 AM    [place]

## 2021-06-27 ENCOUNTER — Other Ambulatory Visit: Payer: Self-pay

## 2021-06-27 ENCOUNTER — Ambulatory Visit: Payer: Medicare Other | Admitting: Cardiology

## 2021-06-27 ENCOUNTER — Encounter: Payer: Self-pay | Admitting: Cardiology

## 2021-06-27 VITALS — BP 132/84 | HR 48 | Resp 97 | Ht 69.0 in | Wt 163.6 lb

## 2021-06-27 DIAGNOSIS — I4510 Unspecified right bundle-branch block: Secondary | ICD-10-CM | POA: Diagnosis not present

## 2021-06-27 DIAGNOSIS — I10 Essential (primary) hypertension: Secondary | ICD-10-CM

## 2021-06-27 DIAGNOSIS — I25708 Atherosclerosis of coronary artery bypass graft(s), unspecified, with other forms of angina pectoris: Secondary | ICD-10-CM

## 2021-06-27 DIAGNOSIS — I2510 Atherosclerotic heart disease of native coronary artery without angina pectoris: Secondary | ICD-10-CM | POA: Diagnosis not present

## 2021-06-27 DIAGNOSIS — E7800 Pure hypercholesterolemia, unspecified: Secondary | ICD-10-CM

## 2021-07-07 ENCOUNTER — Ambulatory Visit (HOSPITAL_COMMUNITY)
Admission: RE | Admit: 2021-07-07 | Discharge: 2021-07-07 | Disposition: A | Discharge status: Home / Self Care | Payer: Medicare Other | Source: Ambulatory Visit | Attending: Urology | Admitting: Urology

## 2021-07-07 ENCOUNTER — Other Ambulatory Visit (HOSPITAL_COMMUNITY): Payer: Self-pay | Admitting: Urology

## 2021-07-07 ENCOUNTER — Other Ambulatory Visit: Payer: Self-pay

## 2021-07-07 DIAGNOSIS — C649 Malignant neoplasm of unspecified kidney, except renal pelvis: Secondary | ICD-10-CM | POA: Diagnosis not present

## 2021-07-07 DIAGNOSIS — Z85528 Personal history of other malignant neoplasm of kidney: Secondary | ICD-10-CM | POA: Diagnosis not present

## 2021-07-07 DIAGNOSIS — Z951 Presence of aortocoronary bypass graft: Secondary | ICD-10-CM | POA: Diagnosis not present

## 2021-07-11 DIAGNOSIS — Z85828 Personal history of other malignant neoplasm of skin: Secondary | ICD-10-CM | POA: Diagnosis not present

## 2021-07-13 DIAGNOSIS — K449 Diaphragmatic hernia without obstruction or gangrene: Secondary | ICD-10-CM | POA: Diagnosis not present

## 2021-07-13 DIAGNOSIS — K5730 Diverticulosis of large intestine without perforation or abscess without bleeding: Secondary | ICD-10-CM | POA: Diagnosis not present

## 2021-07-13 DIAGNOSIS — C642 Malignant neoplasm of left kidney, except renal pelvis: Secondary | ICD-10-CM | POA: Diagnosis not present

## 2021-07-13 DIAGNOSIS — N281 Cyst of kidney, acquired: Secondary | ICD-10-CM | POA: Diagnosis not present

## 2021-07-13 DIAGNOSIS — Z85528 Personal history of other malignant neoplasm of kidney: Secondary | ICD-10-CM | POA: Diagnosis not present

## 2021-07-19 DIAGNOSIS — N183 Chronic kidney disease, stage 3 unspecified: Secondary | ICD-10-CM | POA: Diagnosis not present

## 2021-07-19 DIAGNOSIS — C642 Malignant neoplasm of left kidney, except renal pelvis: Secondary | ICD-10-CM | POA: Diagnosis not present

## 2021-08-04 DIAGNOSIS — L812 Freckles: Secondary | ICD-10-CM | POA: Diagnosis not present

## 2021-08-04 DIAGNOSIS — L821 Other seborrheic keratosis: Secondary | ICD-10-CM | POA: Diagnosis not present

## 2021-08-04 DIAGNOSIS — L738 Other specified follicular disorders: Secondary | ICD-10-CM | POA: Diagnosis not present

## 2021-08-04 DIAGNOSIS — L570 Actinic keratosis: Secondary | ICD-10-CM | POA: Diagnosis not present

## 2021-08-04 DIAGNOSIS — Z85820 Personal history of malignant melanoma of skin: Secondary | ICD-10-CM | POA: Diagnosis not present

## 2021-08-04 DIAGNOSIS — Z85828 Personal history of other malignant neoplasm of skin: Secondary | ICD-10-CM | POA: Diagnosis not present

## 2021-08-18 ENCOUNTER — Encounter (INDEPENDENT_AMBULATORY_CARE_PROVIDER_SITE_OTHER): Payer: Medicare Other | Admitting: Ophthalmology

## 2021-08-21 DIAGNOSIS — Z6710 Type A blood, Rh positive: Secondary | ICD-10-CM | POA: Insufficient documentation

## 2021-08-21 DIAGNOSIS — R7301 Impaired fasting glucose: Secondary | ICD-10-CM | POA: Insufficient documentation

## 2021-08-21 DIAGNOSIS — D649 Anemia, unspecified: Secondary | ICD-10-CM | POA: Insufficient documentation

## 2021-08-21 DIAGNOSIS — E871 Hypo-osmolality and hyponatremia: Secondary | ICD-10-CM | POA: Insufficient documentation

## 2021-08-21 DIAGNOSIS — Z905 Acquired absence of kidney: Secondary | ICD-10-CM | POA: Insufficient documentation

## 2021-08-22 ENCOUNTER — Telehealth: Payer: Self-pay | Admitting: Cardiology

## 2021-08-22 DIAGNOSIS — R413 Other amnesia: Secondary | ICD-10-CM

## 2021-08-22 DIAGNOSIS — I25708 Atherosclerosis of coronary artery bypass graft(s), unspecified, with other forms of angina pectoris: Secondary | ICD-10-CM

## 2021-08-22 NOTE — Telephone Encounter (Signed)
Mailbox is full. Unable to leave message

## 2021-08-22 NOTE — Telephone Encounter (Signed)
Patient's wife is requesting a referral to Dr. Ellouise Newer. She states this was previously discussed with Dr. [name].

## 2021-08-23 ENCOUNTER — Ambulatory Visit (INDEPENDENT_AMBULATORY_CARE_PROVIDER_SITE_OTHER): Payer: Medicare Other | Admitting: Ophthalmology

## 2021-08-23 ENCOUNTER — Encounter (INDEPENDENT_AMBULATORY_CARE_PROVIDER_SITE_OTHER): Payer: Self-pay | Admitting: Ophthalmology

## 2021-08-23 ENCOUNTER — Ambulatory Visit: Payer: Medicare Other | Admitting: Nurse Practitioner

## 2021-08-23 ENCOUNTER — Other Ambulatory Visit: Payer: Self-pay

## 2021-08-23 DIAGNOSIS — H43811 Vitreous degeneration, right eye: Secondary | ICD-10-CM | POA: Diagnosis not present

## 2021-08-23 DIAGNOSIS — D3131 Benign neoplasm of right choroid: Secondary | ICD-10-CM

## 2021-08-23 DIAGNOSIS — H35372 Puckering of macula, left eye: Secondary | ICD-10-CM | POA: Diagnosis not present

## 2021-08-23 DIAGNOSIS — H35371 Puckering of macula, right eye: Secondary | ICD-10-CM

## 2021-08-23 NOTE — Progress Notes (Signed)
08/23/2021     CHIEF COMPLAINT Patient presents for  Chief Complaint  Patient presents with   Retina Evaluation      HISTORY OF PRESENT ILLNESS: Zachary Hall is a 87 y.o. male who presents to the clinic today for:   HPI   History of epiretinal membrane in the right eye with early topographic distortion.  Also history of choroidal nevus on the right Last edited by Hurman Horn, MD on 08/23/2021  2:03 PM.      Referring physician: Doreen Beam, FNP [address] [facility] [address],  [place] [address]  HISTORICAL INFORMATION:   Selected notes from the [place]    Lab Results  Component Value Date   HGBA1C  02/03/2010    5.6 (NOTE)                                                                       According to the ADA Clinical Practice Recommendations for 2011, when HbA1c is used as a screening test:   >=6.5%   Diagnostic of Diabetes Mellitus           (if abnormal result  is confirmed)  5.7-6.4%   Increased risk of developing Diabetes Mellitus  References:Diagnosis and Classification of Diabetes Mellitus,Diabetes [id number],0626,94(WNIOE 1):S62-S69 and Standards of Medical Care in         Diabetes - 2011,Diabetes VOJJ,[id number],81  (Suppl 1):S11-S61.     CURRENT MEDICATIONS: No current outpatient medications on file. (Ophthalmic Drugs)   No current facility-administered medications for this visit. (Ophthalmic Drugs)   Current Outpatient Medications (Other)  Medication Sig   amLODipine (NORVASC) 10 MG tablet Take 1 tablet (10 mg total) by mouth daily.   ammonium lactate (AMLACTIN) 12 % cream Apply 1 g topically as needed for dry skin.   atorvastatin (LIPITOR) 10 MG tablet Take 1 tablet (10 mg total) by mouth daily at 6 PM. (Patient taking differently: Take 10 mg by mouth at bedtime.)   cetirizine (ZYRTEC) 10 MG tablet Take 10 mg by mouth daily.   docusate sodium (COLACE) 100 MG capsule Take 1 capsule (100 mg total) by mouth 2 (two) times daily.    hydrochlorothiazide (MICROZIDE) 12.5 MG capsule TAKE 1 CAPSULE BY MOUTH EVERY DAY (Patient taking differently: Take 12.5 mg by mouth daily.)   losartan (COZAAR) 50 MG tablet Take 1 tablet (50 mg total) by mouth daily.   mupirocin ointment (BACTROBAN) 2 % Apply 1 application topically 2 (two) times daily.   nitroGLYCERIN (NITROSTAT) 0.4 MG SL tablet *PLACE 1 TABLET UNDER TONGUE EVERY 5 MINS., UP TO 3 DOSES AS NEEDED FOR CHEST PAIN* (Patient taking differently: Place 0.4 mg under the tongue every 5 (five) minutes as needed for chest pain.)   omeprazole (PRILOSEC) 40 MG capsule Take 40 mg by mouth daily before breakfast.   promethazine (PHENERGAN) 12.5 MG tablet Take 1 tablet (12.5 mg total) by mouth every 4 (four) hours as needed for nausea or vomiting.   traMADol (ULTRAM) 50 MG tablet Take 1-2 tablets (50-100 mg total) by mouth every 6 (six) hours as needed for moderate pain or severe pain.   No current facility-administered medications for this visit. (Other)      REVIEW OF SYSTEMS:  ALLERGIES Allergies  Allergen Reactions   Lisinopril Other (See Comments)    Cough    PAST MEDICAL HISTORY Past Medical History:  Diagnosis Date   Arthritis    Balance problem    Barrett esophagus    Bradycardia    Coronary artery disease    GERD (gastroesophageal reflux disease)    Gout    History of atrial fibrillation    Previously on amiodarone   Hx of CABG    Hypercholesterolemia    Hypertension    Left renal mass    Melanoma ([place])    Left shoulder blade   Nocturia    Right epiretinal membrane 02/10/2020   Past Surgical History:  Procedure Laterality Date   CARDIAC CATHETERIZATION  01/04/2010   COLONOSCOPY     CORONARY ARTERY BYPASS GRAFT  02/07/2010   LIMA to LAD, SVG to 2nd DX, SVG to OM   EYE SURGERY Bilateral    Cataract Extraction   HEMORRHOID SURGERY     x 2   HEMORRHOID SURGERY N/A 11/13/2012   Procedure: [place] Hemorrhoidectomy;  Surgeon: Merrie Roof, MD;  Location:  [facility];  Service: General;  Laterality: N/A;   INGUINAL HERNIA REPAIR Right 01/22/2017   Procedure: RIGHT INGUINAL HERNIA REPAIR WITH MESH;  Surgeon: Armandina Gemma, MD;  Location: [facility];  Service: General;  Laterality: Right;   INSERTION OF MESH Right 01/22/2017   Procedure: INSERTION OF MESH;  Surgeon: Armandina Gemma, MD;  Location: [facility];  Service: General;  Laterality: Right;   IR RADIOLOGIST EVAL & MGMT  01/06/2021   NASAL POLYP EXCISION     OTHER SURGICAL HISTORY  06/05/2002   Penile Implant   ROBOTIC ASSITED PARTIAL NEPHRECTOMY Left 02/25/2021   Procedure: XI ROBOTIC ASSITED PARTIAL NEPHRECTOMY;  Surgeon: Ceasar Mons, MD;  Location: [facility];  Service: Urology;  Laterality: Left;   UPPER GI ENDOSCOPY      FAMILY HISTORY Family History  Problem Relation Age of Onset   Alcohol abuse Father    Colon cancer Mother    Cancer Mother        colon and bone   Heart attack Neg Hx    Hypertension Neg Hx    Stroke Neg Hx     SOCIAL HISTORY Social History   Tobacco Use   Smoking status: Former    Types: Cigarettes    Quit date: 01/07/1961    Years since quitting: 60.6   Smokeless tobacco: Former  Scientific laboratory technician Use: Never used  Substance Use Topics   Alcohol use: Yes    Alcohol/week: 4.0 standard drinks    Types: 4 Cans of beer per week   Drug use: No         OPHTHALMIC EXAM:  Base Eye Exam     Visual Acuity (ETDRS)       Right Left   Dist [place] 20/50 -1 20/50 -3   Dist ph [place] 20/30 20/30         Tonometry (Tonopen, 1:17 PM)       Right Left   Pressure 18 18         Pupils       Pupils APD   Right PERRL None   Left PERRL None         Visual Fields       Left Right    Full Full         Extraocular Movement  Right Left    Full, Ortho Full, Ortho         Neuro/Psych     Oriented x3: Yes   Mood/Affect: Normal         Dilation     Both eyes: 1.0% Mydriacyl, 2.5% Phenylephrine @ 1:17 PM           Slit Lamp  and Fundus Exam     External Exam       Right Left   External Normal Normal         Slit Lamp Exam       Right Left   Lids/Lashes Normal Normal   Conjunctiva/Sclera White and quiet White and quiet   Cornea Clear Clear   Anterior Chamber Deep and quiet Deep and quiet   Iris Round and reactive Round and reactive   Lens Centered posterior chamber intraocular lens, PC open Centered posterior chamber intraocular lens, PC open   Anterior Vitreous Normal Normal         Fundus Exam       Right Left   Posterior Vitreous Posterior vitreous detachment Posterior vitreous detachment   Disc Normal Normal   C/D Ratio 0.1 0.25   Macula Epiretinal membrane, moderate topo distortion Normal   Vessels Normal Normal   Periphery , Pigmentation, Reticular degeneration, small nevus superotemporal, flat, 1.5 disc areas in size, no change over time with no atrophy, no lipofuscin, no fluid, no thickening  , Pigmentation, Reticular degeneration            IMAGING AND PROCEDURES  Imaging and Procedures for 08/23/21  OCT, Retina - OU - Both Eyes       Right Eye Quality was good. Scan locations included subfoveal. Central Foveal Thickness: 403. Progression has worsened. Findings include abnormal foveal contour, subretinal hyper-reflective material.   Left Eye Quality was good. Scan locations included subfoveal. Central Foveal Thickness: 325. Progression has been stable. Findings include normal foveal contour, epiretinal membrane.   Notes Outer retinal change at the EZ layer and IS -OS layer, of the right eye secondary to epiretinal membrane.  With slight increase in thickness in the macular region right eye  OS, minor epiretinal membrane with absolutely no topographic distortion And no foveal thickening             ASSESSMENT/PLAN:  Choroidal nevus of right eye Stable over time, no changes observe  Macular pucker, left eye No impact on acuity  Right epiretinal membrane The  nature of macular pucker (epiretinal membrane ERM) was discussed with the patient as well as threshold criteria for vitrectomy surgery. I explained that in rare cases another surgery is needed to actually remove a second wrinkle should it regrow.  Most often, the epiretinal membrane and underlying wrinkled internal limiting membrane are removed with the first surgery, to accomplish the goals.   If the operative eye is Phakic (natural lens still present), cataract surgery is often recommended prior to Vitrectomy. This will enable the retina surgeon to have the best view during surgery and the patient to obtain optimal results in the future. Treatment options were discussed.  I have recommended at home monitoring the near vision task in a monocular (1 eye at a time), with or without near vision glasses, to look for changes or declines in reading.  OD, minor no impact on acuity observe     ICD-10-CM   1. Right epiretinal membrane  H35.371 OCT, Retina - OU - Both Eyes    2. Posterior vitreous  detachment of right eye  H43.811     3. Choroidal nevus of right eye  D31.31     4. Macular pucker, left eye  H35.372       OD, minor impact, no impact on acuity at this time  2.  Choroidal nevus no high risk features right I observe  3.  Ophthalmic Meds Ordered this visit:  No orders of the defined types were placed in this encounter.      Return in about 1 year (around 08/23/2022) for DILATE OU, OCT, COLOR FP.  There are no Patient Instructions on file for this visit.   Explained the diagnoses, plan, and follow up with the patient and they expressed understanding.  Patient expressed understanding of the importance of proper follow up care.   Clent Demark Kella Splinter M.D. Diseases & Surgery of the Retina and Vitreous [facility] [place] 08/23/21     Abbreviations: M myopia (nearsighted); A astigmatism; H hyperopia (farsighted); P presbyopia; Mrx spectacle prescription;  CTL contact  lenses; OD right eye; OS left eye; OU both eyes  XT exotropia; ET esotropia; PEK punctate epithelial keratitis; PEE punctate epithelial erosions; DES dry eye syndrome; MGD meibomian gland dysfunction; ATs artificial tears; PFAT's preservative free artificial tears; [place] nuclear sclerotic cataract; PSC posterior subcapsular cataract; ERM epi-retinal membrane; PVD posterior vitreous detachment; RD retinal detachment; DM diabetes mellitus; DR diabetic retinopathy; NPDR non-proliferative diabetic retinopathy; PDR proliferative diabetic retinopathy; CSME clinically significant macular edema; DME diabetic macular edema; dbh dot blot hemorrhages; CWS cotton wool spot; POAG primary open angle glaucoma; C/D cup-to-disc ratio; HVF humphrey visual field; GVF goldmann visual field; OCT optical coherence tomography; IOP intraocular pressure; BRVO Branch retinal vein occlusion; CRVO central retinal vein occlusion; CRAO central retinal artery occlusion; BRAO branch retinal artery occlusion; RT retinal tear; SB scleral buckle; PPV pars plana vitrectomy; VH Vitreous hemorrhage; PRP panretinal laser photocoagulation; IVK intravitreal kenalog; VMT vitreomacular traction; MH Macular hole;  NVD neovascularization of the disc; NVE neovascularization elsewhere; AREDS age related eye disease study; ARMD age related macular degeneration; POAG primary open angle glaucoma; EBMD epithelial/anterior basement membrane dystrophy; ACIOL anterior chamber intraocular lens; IOL intraocular lens; PCIOL posterior chamber intraocular lens; Phaco/IOL phacoemulsification with intraocular lens placement; [place] photorefractive keratectomy; LASIK laser assisted in situ keratomileusis; HTN hypertension; DM diabetes mellitus; COPD chronic obstructive pulmonary disease

## 2021-08-23 NOTE — Telephone Encounter (Signed)
Spoke to patient's wife Dr.Jordan advised ok to place order to see Dr.Aquino.Advised someone from that office will call with appointment.

## 2021-08-23 NOTE — Telephone Encounter (Signed)
Ok to make referral. Progressive memory loss  Khloie Hamada [name] MD, [facility]

## 2021-08-23 NOTE — Assessment & Plan Note (Signed)
No impact on acuity 

## 2021-08-23 NOTE — Assessment & Plan Note (Signed)
The nature of macular pucker (epiretinal membrane ERM) was discussed with the patient as well as threshold criteria for vitrectomy surgery. I explained that in rare cases another surgery is needed to actually remove a second wrinkle should it regrow.  Most often, the epiretinal membrane and underlying wrinkled internal limiting membrane are removed with the first surgery, to accomplish the goals.   If the operative eye is Phakic (natural lens still present), cataract surgery is often recommended prior to Vitrectomy. This will enable the retina surgeon to have the best view during surgery and the patient to obtain optimal results in the future. Treatment options were discussed.  I have recommended at home monitoring the near vision task in a monocular (1 eye at a time), with or without near vision glasses, to look for changes or declines in reading.  OD, minor no impact on acuity observe

## 2021-08-23 NOTE — Assessment & Plan Note (Signed)
Stable over time, no changes observe

## 2021-08-23 NOTE — Telephone Encounter (Signed)
Message routed to Dr. Cheri Kearns LPN for review of referral request

## 2021-08-26 ENCOUNTER — Encounter: Payer: Self-pay | Admitting: Physician Assistant

## 2021-09-06 ENCOUNTER — Ambulatory Visit: Payer: Medicare Other | Admitting: Physician Assistant

## 2021-09-06 ENCOUNTER — Encounter: Payer: Self-pay | Admitting: Physician Assistant

## 2021-09-06 ENCOUNTER — Other Ambulatory Visit (INDEPENDENT_AMBULATORY_CARE_PROVIDER_SITE_OTHER): Payer: Medicare Other

## 2021-09-06 ENCOUNTER — Other Ambulatory Visit: Payer: Self-pay

## 2021-09-06 VITALS — BP 144/74 | HR 49 | Resp 18 | Ht 70.0 in | Wt 168.0 lb

## 2021-09-06 DIAGNOSIS — R413 Other amnesia: Secondary | ICD-10-CM

## 2021-09-06 DIAGNOSIS — G309 Alzheimer's disease, unspecified: Secondary | ICD-10-CM | POA: Diagnosis not present

## 2021-09-06 DIAGNOSIS — F0280 Dementia in other diseases classified elsewhere without behavioral disturbance: Secondary | ICD-10-CM | POA: Diagnosis not present

## 2021-09-06 LAB — VITAMIN B12: Vitamin B-12: 496 pg/mL (ref 211–911)

## 2021-09-06 LAB — TSH: TSH: 2.63 u[IU]/mL (ref 0.35–5.50)
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
## 2021-09-06 NOTE — Patient Instructions (Addendum)
It was a pleasure to see you today at our office.   Recommendations:  Meds: Follow up in  3 months Start Memantine  5 mg tablets.  Take 1 tablet at bedtime for 2 weeks, then 1 tablet twice daily.   Side effects discussed  MRI brain Check labs   RECOMMENDATIONS FOR ALL PATIENTS WITH MEMORY PROBLEMS: 1. Continue to exercise (Recommend 30 minutes of walking everyday, or 3 hours every week) 2. Increase social interactions - continue going to [place] and enjoy social gatherings with friends and family 3. Eat healthy, avoid fried foods and eat more fruits and vegetables 4. Maintain adequate blood pressure, blood sugar, and blood cholesterol level. Reducing the risk of stroke and cardiovascular disease also helps promoting better memory. 5. Avoid stressful situations. Live a simple life and avoid aggravations. Organize your time and prepare for the next day in anticipation. 6. Sleep well, avoid any interruptions of sleep and avoid any distractions in the bedroom that may interfere with adequate sleep quality 7. Avoid sugar, avoid sweets as there is a strong link between excessive sugar intake, diabetes, and cognitive impairment We discussed the Mediterranean diet, which has been shown to help patients reduce the risk of progressive memory disorders and reduces cardiovascular risk. This includes eating fish, eat fruits and green leafy vegetables, nuts like almonds and hazelnuts, walnuts, and also use olive oil. Avoid fast foods and fried foods as much as possible. Avoid sweets and sugar as sugar use has been linked to worsening of memory function.  There is always a concern of gradual progression of memory problems. If this is the case, then we may need to adjust level of care according to patient needs. Support, both to the patient and caregiver, should then be put into place.    The [employer] is here all day, every day for people facing Alzheimers disease through our free 24/7  Helpline: [phone]. The Helpline provides reliable information and support to all those who need assistance, such as individuals living with memory loss, Alzheimer's or other dementia, caregivers, health care professionals and the public.  Our highly trained and knowledgeable staff can help you with: Understanding memory loss, dementia and Alzheimer's  Medications and other treatment options  General information about aging and brain health  Skills to provide quality care and to find the best care from professionals  Legal, financial and living-arrangement decisions Our Helpline also features: Confidential care consultation provided by master's level clinicians who can help with decision-making support, crisis assistance and education on issues families face every day  Help in a caller's preferred language using our translation service that features more than 200 languages and dialects  Referrals to local community programs, services and ongoing support     FALL PRECAUTIONS: Be cautious when walking. Scan the area for obstacles that may increase the risk of trips and falls. When getting up in the mornings, sit up at the edge of the bed for a few minutes before getting out of bed. Consider elevating the bed at the head end to avoid drop of blood pressure when getting up. Walk always in a well-lit room (use night lights in the walls). Avoid area rugs or power cords from appliances in the middle of the walkways. Use a walker or a cane if necessary and consider physical therapy for balance exercise. Get your eyesight checked regularly.  FINANCIAL OVERSIGHT: Supervision, especially oversight when making financial decisions or transactions is also recommended.  HOME SAFETY: Consider the safety of the  kitchen when operating appliances like stoves, microwave oven, and blender. Consider having supervision and share cooking responsibilities until no longer able to participate in those. Accidents with  firearms and other hazards in the house should be identified and addressed as well.   ABILITY TO BE LEFT ALONE: If patient is unable to contact 911 operator, consider using LifeLine, or when the need is there, arrange for someone to stay with patients. Smoking is a fire hazard, consider supervision or cessation. Risk of wandering should be assessed by caregiver and if detected at any point, supervision and safe proof recommendations should be instituted.  MEDICATION SUPERVISION: Inability to self-administer medication needs to be constantly addressed. Implement a mechanism to ensure safe administration of the medications.   DRIVING: Regarding driving, in patients with progressive memory problems, driving will be impaired. We advise to have someone else do the driving if trouble finding directions or if minor accidents are reported. Independent driving assessment is available to determine safety of driving.   If you are interested in the driving assessment, you can contact the following:  [employer] in [place]  [place] [phone] or [phone]      [place] refers to food and lifestyle choices that are based on the traditions of countries located on the [employer]. This way of eating has been shown to help prevent certain conditions and improve outcomes for people who have chronic diseases, like kidney disease and heart disease. What are tips for following this plan? Lifestyle  Cook and eat meals together with your family, when possible. Drink enough fluid to keep your urine clear or pale yellow. Be physically active every day. This includes: Aerobic exercise like running or swimming. Leisure activities like gardening, walking, or housework. Get 7-8 hours of sleep each night. If recommended by your health care provider,  drink red wine in moderation. This means 1 glass a day for nonpregnant women and 2 glasses a day for men. A glass of wine equals 5 oz (150 mL). Reading food labels  Check the serving size of packaged foods. For foods such as rice and pasta, the serving size refers to the amount of cooked product, not dry. Check the total fat in packaged foods. Avoid foods that have saturated fat or trans fats. Check the ingredients list for added sugars, such as corn syrup. Shopping  At the grocery store, buy most of your food from the areas near the walls of the store. This includes: Fresh fruits and vegetables (produce). Grains, beans, nuts, and seeds. Some of these may be available in unpackaged forms or large amounts (in bulk). Fresh seafood. Poultry and eggs. Low-fat dairy products. Buy whole ingredients instead of prepackaged foods. Buy fresh fruits and vegetables in-season from local farmers markets. Buy frozen fruits and vegetables in resealable bags. If you do not have access to quality fresh seafood, buy precooked frozen shrimp or canned fish, such as tuna, salmon, or sardines. Buy small amounts of raw or cooked vegetables, salads, or olives from the deli or salad bar at your store. Stock your pantry so you always have certain foods on hand, such as olive oil, canned tuna, canned tomatoes, rice, pasta, and beans. Cooking  Cook foods with extra-virgin olive oil instead of using butter or other vegetable oils. Have meat as a side dish, and have vegetables or grains as your main dish. This means having meat in small portions  or adding small amounts of meat to foods like pasta or stew. Use beans or vegetables instead of meat in common dishes like chili or lasagna. Experiment with different cooking methods. Try roasting or broiling vegetables instead of steaming or sauteing them. Add frozen vegetables to soups, stews, pasta, or rice. Add nuts or seeds for added healthy fat at each meal. You can add  these to yogurt, salads, or vegetable dishes. Marinate fish or vegetables using olive oil, lemon juice, garlic, and fresh herbs. Meal planning  Plan to eat 1 vegetarian meal one day each week. Try to work up to 2 vegetarian meals, if possible. Eat seafood 2 or more times a week. Have healthy snacks readily available, such as: Vegetable sticks with hummus. Greek yogurt. Fruit and nut trail mix. Eat balanced meals throughout the week. This includes: Fruit: 2-3 servings a day Vegetables: 4-5 servings a day Low-fat dairy: 2 servings a day Fish, poultry, or lean meat: 1 serving a day Beans and legumes: 2 or more servings a week Nuts and seeds: 1-2 servings a day Whole grains: 6-8 servings a day Extra-virgin olive oil: 3-4 servings a day Limit red meat and sweets to only a few servings a month What are my food choices? Mediterranean diet Recommended Grains: Whole-grain pasta. Brown rice. Bulgar wheat. Polenta. Couscous. Whole-wheat bread. [place]. Vegetables: Artichokes. Beets. Broccoli. Cabbage. Carrots. Eggplant. Green beans. Chard. Kale. Spinach. Onions. Leeks. Peas. Squash. Tomatoes. Peppers. Radishes. Fruits: Apples. Apricots. Avocado. Berries. Bananas. Cherries. Dates. Figs. Grapes. Lemons. Melon. Oranges. Peaches. Plums. Pomegranate. Meats and other protein foods: Beans. Almonds. Sunflower seeds. Pine nuts. Peanuts. Malabar. Salmon. Scallops. Shrimp. [place]. Tilapia. Clams. Oysters. Eggs. Dairy: Low-fat milk. Cheese. Greek yogurt. Beverages: Water. Red wine. Herbal tea. Fats and oils: Extra virgin olive oil. Avocado oil. Grape seed oil. Sweets and desserts: [place] yogurt with honey. Baked apples. Poached pears. Trail mix. Seasoning and other foods: Basil. Cilantro. Coriander. Cumin. Mint. Parsley. Sage. Rosemary. Tarragon. Garlic. Oregano. Thyme. Pepper. Balsalmic vinegar. Tahini. Hummus. Tomato sauce. Olives. Mushrooms. Limit these Grains: Prepackaged pasta or rice dishes.  Prepackaged cereal with added sugar. Vegetables: Deep fried potatoes (french fries). Fruits: Fruit canned in syrup. Meats and other protein foods: Beef. Pork. Lamb. Poultry with skin. Hot dogs. Berniece Salines. Dairy: Ice cream. Sour cream. Whole milk. Beverages: Juice. Sugar-sweetened soft drinks. Beer. Liquor and spirits. Fats and oils: Butter. Canola oil. Vegetable oil. Beef fat (tallow). Lard. Sweets and desserts: Cookies. Cakes. Pies. Candy. Seasoning and other foods: Mayonnaise. Premade sauces and marinades. The items listed may not be a complete list. Talk with your dietitian about what dietary choices are right for you. Summary The Mediterranean diet includes both food and lifestyle choices. Eat a variety of fresh fruits and vegetables, beans, nuts, seeds, and whole grains. Limit the amount of red meat and sweets that you eat. Talk with your health care provider about whether it is safe for you to drink red wine in moderation. This means 1 glass a day for nonpregnant women and 2 glasses a day for men. A glass of wine equals 5 oz (150 mL). This information is not intended to replace advice given to you by your health care provider. Make sure you discuss any questions you have with your health care provider. Document Released: 03/09/2016 Document Revised: 04/11/2016 Document Reviewed: 03/09/2016 Elsevier Interactive Patient Education  2017 [employer].  We have sent a referral to [facility] for your MRI and they will call you directly to schedule your appointment. They  are located at [address]. If you need to contact them directly please call [phone].  Your provider has requested that you have labwork completed today. Please go to [facility] ([id number]) on the second floor of this building before leaving the office today. You do not need to check in. If you are not called within 15 minutes please check with the front desk.

## 2021-09-07 DIAGNOSIS — F0280 Dementia in other diseases classified elsewhere without behavioral disturbance: Secondary | ICD-10-CM | POA: Insufficient documentation

## 2021-09-07 MED ORDER — MEMANTINE HCL 5 MG PO TABS: ORAL_TABLET | ORAL | 11 refills | Status: DC

## 2021-09-07 NOTE — Progress Notes (Signed)
Pls inform patient b12 and thyroid levels are normal. Thanks

## 2021-09-11 LAB — VITAMIN B1: Vitamin B1 (Thiamine): 47 nmol/L — ABNORMAL HIGH (ref 8–30)

## 2021-09-19 ENCOUNTER — Telehealth: Payer: Self-pay | Admitting: Cardiology

## 2021-09-19 NOTE — Telephone Encounter (Signed)
Spoke to patient's wife Dr.Jordan's advice given. 

## 2021-09-19 NOTE — Telephone Encounter (Signed)
Spoke with pt wife, she reports they are concerned because his heart rate runs in the 40's the medical doctor wanted her to check with dr [name] to see about changing one of his other medications if needed for him to take the memantine. She is not able to check his heart rate at home. He is to start with 5 mg once daily for 2 weeks and then increase to 5 mg twice daily of the memantine. She also wants to let dr [name] know he is having a repeat MRI this Sunday. She reports he is going downhill quickly and she is concerned. Aware will forward to dr [name] to review.

## 2021-09-19 NOTE — Telephone Encounter (Signed)
Spoke to patient's wife, she just wanted to speak with Malachy Mood in regards to her husband's medication. She cannot remember the name of the medication she was wanting to discuss but stated that Dr. [name] or Malachy Mood would know. She said that the patient saw Sharene Butters and she put him on Memantine HCL 5mg    Pt c/o medication issue:  1. Name of Medication: Memantine HCL 5mg   2. How are you currently taking this medication (dosage and times per day)? 1x daily  3. Are you having a reaction (difficulty breathing--STAT)? no  4. What is your medication issue? Spoke to patient's wife, she just wanted to speak with Malachy Mood in regards to her husband's medication. She cannot remember the name of the medication she was wanting to discuss but stated that Dr. [name] or Malachy Mood would know. She said that the patient saw Sharene Butters, PA and she put him on Memantine HCL 5mg  1x daily for the first two weeks and then wants to increase to 2x per day. Sharene Butters, PA was concerned about his pulse but saw that it does typically run low but wanted to check with Dr. [name] about maybe adjusting one of his medications, the wife believed it was a BP medication but was unsure. She stated that Libby Maw, PA suggested maybe cutting one of them in half but wanted Dr. Doug Sou opinion first.

## 2021-09-19 NOTE — Telephone Encounter (Signed)
He is on no rate slowing medication and I am not aware that memantine has any effect on HR. I would just monitor for symptoms- dizziness/blacking out.  Chigozie Basaldua [name] MD, [facility]

## 2021-09-25 ENCOUNTER — Ambulatory Visit
Admission: RE | Admit: 2021-09-25 | Discharge: 2021-09-25 | Disposition: A | Discharge status: Home / Self Care | Payer: Medicare Other | Source: Ambulatory Visit | Attending: Physician Assistant | Admitting: Physician Assistant

## 2021-09-25 ENCOUNTER — Other Ambulatory Visit: Payer: Self-pay

## 2021-09-25 DIAGNOSIS — J341 Cyst and mucocele of nose and nasal sinus: Secondary | ICD-10-CM | POA: Diagnosis not present

## 2021-09-25 DIAGNOSIS — G319 Degenerative disease of nervous system, unspecified: Secondary | ICD-10-CM | POA: Diagnosis not present

## 2021-09-25 DIAGNOSIS — J329 Chronic sinusitis, unspecified: Secondary | ICD-10-CM | POA: Diagnosis not present

## 2021-09-25 DIAGNOSIS — R413 Other amnesia: Secondary | ICD-10-CM | POA: Diagnosis not present

## 2021-10-02 ENCOUNTER — Other Ambulatory Visit: Payer: Self-pay | Admitting: Physician Assistant

## 2021-10-06 ENCOUNTER — Ambulatory Visit: Payer: Medicare Other | Admitting: Adult Health

## 2021-10-16 ENCOUNTER — Other Ambulatory Visit: Payer: Self-pay | Admitting: Cardiology

## 2021-10-17 ENCOUNTER — Other Ambulatory Visit: Payer: Self-pay | Admitting: Cardiology

## 2021-10-19 ENCOUNTER — Ambulatory Visit: Payer: Medicare Other | Admitting: Adult Health

## 2021-11-24 DIAGNOSIS — E7800 Pure hypercholesterolemia, unspecified: Secondary | ICD-10-CM | POA: Diagnosis not present

## 2021-11-24 DIAGNOSIS — I2510 Atherosclerotic heart disease of native coronary artery without angina pectoris: Secondary | ICD-10-CM | POA: Diagnosis not present

## 2021-11-24 DIAGNOSIS — I10 Essential (primary) hypertension: Secondary | ICD-10-CM | POA: Diagnosis not present

## 2021-11-24 DIAGNOSIS — I4510 Unspecified right bundle-branch block: Secondary | ICD-10-CM | POA: Diagnosis not present

## 2021-11-24 DIAGNOSIS — I25708 Atherosclerosis of coronary artery bypass graft(s), unspecified, with other forms of angina pectoris: Secondary | ICD-10-CM | POA: Diagnosis not present

## 2021-11-24 LAB — HEPATIC FUNCTION PANEL
ALT: 13 IU/L (ref 0–44)
AST: 12 IU/L (ref 0–40)
Albumin: 4.1 g/dL (ref 3.6–4.6)
Alkaline Phosphatase: 67 IU/L (ref 44–121)
Bilirubin Total: 0.3 mg/dL (ref 0.0–1.2)
Bilirubin, Direct: 0.11 mg/dL (ref 0.00–0.40)
Total Protein: 5.6 g/dL — ABNORMAL LOW (ref 6.0–8.5)

## 2021-11-24 LAB — BASIC METABOLIC PANEL
BUN/Creatinine Ratio: 22 (ref 10–24)
BUN: 29 mg/dL — ABNORMAL HIGH (ref 8–27)
CO2: 28 mmol/L (ref 20–29)
Calcium: 9.4 mg/dL (ref 8.6–10.2)
Chloride: 102 mmol/L (ref 96–106)
Creatinine, Ser: 1.29 mg/dL — ABNORMAL HIGH (ref 0.76–1.27)
Glucose: 97 mg/dL (ref 70–99)
Potassium: 4.6 mmol/L (ref 3.5–5.2)
Sodium: 141 mmol/L (ref 134–144)
eGFR: 54 mL/min/{1.73_m2} — ABNORMAL LOW (ref >59)

## 2021-11-24 LAB — LIPID PANEL
Chol/HDL Ratio: 3 ratio (ref 0.0–5.0)
Cholesterol, Total: 112 mg/dL (ref 100–199)
HDL: 37 mg/dL — ABNORMAL LOW (ref >39)
LDL Chol Calc (NIH): 53 mg/dL (ref 0–99)
Triglycerides: 122 mg/dL (ref 0–149)
VLDL Cholesterol Cal: 22 mg/dL (ref 5–40)

## 2021-11-25 ENCOUNTER — Emergency Department: Payer: Medicare Other

## 2021-11-25 ENCOUNTER — Other Ambulatory Visit: Payer: Self-pay

## 2021-11-25 ENCOUNTER — Emergency Department
Admission: EM | Admit: 2021-11-25 | Discharge: 2021-11-25 | Disposition: A | Discharge status: Home / Self Care | Payer: Medicare Other | Attending: Emergency Medicine | Admitting: Emergency Medicine

## 2021-11-25 DIAGNOSIS — M2578 Osteophyte, vertebrae: Secondary | ICD-10-CM | POA: Diagnosis not present

## 2021-11-25 DIAGNOSIS — Z043 Encounter for examination and observation following other accident: Secondary | ICD-10-CM | POA: Diagnosis not present

## 2021-11-25 DIAGNOSIS — W19XXXA Unspecified fall, initial encounter: Secondary | ICD-10-CM

## 2021-11-25 DIAGNOSIS — S0101XA Laceration without foreign body of scalp, initial encounter: Secondary | ICD-10-CM | POA: Insufficient documentation

## 2021-11-25 DIAGNOSIS — R001 Bradycardia, unspecified: Secondary | ICD-10-CM | POA: Diagnosis not present

## 2021-11-25 DIAGNOSIS — M47812 Spondylosis without myelopathy or radiculopathy, cervical region: Secondary | ICD-10-CM | POA: Diagnosis not present

## 2021-11-25 DIAGNOSIS — Z23 Encounter for immunization: Secondary | ICD-10-CM | POA: Diagnosis not present

## 2021-11-25 DIAGNOSIS — W109XXA Fall (on) (from) unspecified stairs and steps, initial encounter: Secondary | ICD-10-CM | POA: Insufficient documentation

## 2021-11-25 DIAGNOSIS — R220 Localized swelling, mass and lump, head: Secondary | ICD-10-CM | POA: Diagnosis not present

## 2021-11-25 DIAGNOSIS — S0990XA Unspecified injury of head, initial encounter: Secondary | ICD-10-CM | POA: Diagnosis present

## 2021-11-25 LAB — COMPREHENSIVE METABOLIC PANEL
ALT: 13 U/L (ref 0–44)
AST: 14 U/L — ABNORMAL LOW (ref 15–41)
Albumin: 3.7 g/dL (ref 3.5–5.0)
Alkaline Phosphatase: 55 U/L (ref 38–126)
Anion gap: 7 (ref 5–15)
BUN: 33 mg/dL — ABNORMAL HIGH (ref 8–23)
CO2: 29 mmol/L (ref 22–32)
Calcium: 9.3 mg/dL (ref 8.9–10.3)
Chloride: 104 mmol/L (ref 98–111)
Creatinine, Ser: 1.41 mg/dL — ABNORMAL HIGH (ref 0.61–1.24)
GFR, Estimated: 49 mL/min — ABNORMAL LOW (ref >60)
Glucose, Bld: 109 mg/dL — ABNORMAL HIGH (ref 70–99)
Potassium: 4 mmol/L (ref 3.5–5.1)
Sodium: 140 mmol/L (ref 135–145)
Total Bilirubin: 0.6 mg/dL (ref 0.3–1.2)
Total Protein: 6.2 g/dL — ABNORMAL LOW (ref 6.5–8.1)

## 2021-11-25 LAB — CBC WITH DIFFERENTIAL/PLATELET
Abs Immature Granulocytes: 0.03 10*3/uL (ref 0.00–0.07)
Basophils Absolute: 0 10*3/uL (ref 0.0–0.1)
Basophils Relative: 0 %
Eosinophils Absolute: 0.5 10*3/uL (ref 0.0–0.5)
Eosinophils Relative: 6 %
HCT: 34.1 % — ABNORMAL LOW (ref 39.0–52.0)
Hemoglobin: 11.3 g/dL — ABNORMAL LOW (ref 13.0–17.0)
Immature Granulocytes: 0 %
Lymphocytes Relative: 15 %
Lymphs Abs: 1.4 10*3/uL (ref 0.7–4.0)
MCH: 33.1 pg (ref 26.0–34.0)
MCHC: 33.1 g/dL (ref 30.0–36.0)
MCV: 100 fL (ref 80.0–100.0)
Monocytes Absolute: 0.6 10*3/uL (ref 0.1–1.0)
Monocytes Relative: 6 %
Neutro Abs: 6.9 10*3/uL (ref 1.7–7.7)
Neutrophils Relative %: 73 %
Platelets: 157 10*3/uL (ref 150–400)
RBC: 3.41 MIL/uL — ABNORMAL LOW (ref 4.22–5.81)
RDW: 13 % (ref 11.5–15.5)
WBC: 9.4 10*3/uL (ref 4.0–10.5)
nRBC: 0 % (ref 0.0–0.2)

## 2021-11-25 LAB — TROPONIN I (HIGH SENSITIVITY): Troponin I (High Sensitivity): 10 ng/L (ref <18)

## 2021-11-25 MED ORDER — LIDOCAINE-EPINEPHRINE 2 %-1:100000 IJ SOLN
20.0000 mL | Freq: Once | INTRAMUSCULAR | Status: AC
  Administered 2021-11-25: 20 mL
  Filled 2021-11-25: qty 1

## 2021-11-25 MED ORDER — TETANUS-DIPHTH-ACELL PERTUSSIS 5-2.5-18.5 LF-MCG/0.5 IM SUSY
0.5000 mL | PREFILLED_SYRINGE | Freq: Once | INTRAMUSCULAR | Status: AC
  Administered 2021-11-25: 0.5 mL via INTRAMUSCULAR
  Filled 2021-11-25: qty 0.5

## 2021-11-25 MED ORDER — ACETAMINOPHEN 500 MG PO TABS
1000.0000 mg | ORAL_TABLET | Freq: Once | ORAL | Status: AC
  Administered 2021-11-25: 1000 mg via ORAL
  Filled 2021-11-25: qty 2

## 2021-11-25 NOTE — Discharge Instructions (Addendum)
Staples need removed in 10-14 days. Avoid ibuprofen can take tylenol 1g every 8 hours.  ? ?F.u for BP check with PCP.  ?

## 2021-11-25 NOTE — ED Notes (Signed)
Report received from Koppel, RN ?

## 2021-11-25 NOTE — ED Provider Triage Note (Signed)
?  Emergency Medicine Provider Triage Evaluation Note ? ?Zachary Hall , a 87 y.o.male,  was evaluated in triage.  Pt complains of injuries sustained from fall.  Patient states he has come down the stairs when he accidentally fell.  Denies preceding symptoms or LOC.  Patient sustained a laceration to the posterior aspect of his scalp.  Reports mild headache and dizziness. ? ? ?Review of Systems  ?Positive: Headache, dizziness ?Negative: Denies fever, chest pain, vomiting ? ?Physical Exam  ? ?Vitals:  ? 11/25/21 1734  ?BP: (!) 167/63  ?Pulse: (!) 52  ?Resp: 20  ?Temp: 97.9 ?F (36.6 ?C)  ?SpO2: 98%  ? ?Gen:   Awake, no distress   ?Resp:  Normal effort  ?MSK:   Moves extremities without difficulty  ?Other:  Laceration present along the posterior aspect of the scalp.  Unknown length due to blood clots. ? ?Medical Decision Making  ?Given the patient's initial medical screening exam, the following diagnostic evaluation has been ordered. The patient will be placed in the appropriate treatment space, once one is available, to complete the evaluation and treatment. I have discussed the plan of care with the patient and I have advised the patient that an ED physician or mid-level practitioner will reevaluate their condition after the test results have been received, as the results may give them additional insight into the type of treatment they may need.  ? ? ?Diagnostics: Head/neck CT ? ?Treatments: none immediately ?  ?Teodoro Spray, [place] ?11/25/21 1740 ? ?

## 2021-11-25 NOTE — ED Triage Notes (Signed)
Pt arrives with c/o fall when coming down some steps. Pt denies LOC or headache. Pt does not take blood thinners. Pt had a episode of dizziness after he fell.  ?

## 2021-11-25 NOTE — ED Notes (Signed)
Lido w/ epi at the bedside ?

## 2021-11-25 NOTE — ED Provider Notes (Signed)
? ?[facility] ?Provider Note ? ? ? Event Date/Time  ? First MD Initiated Contact with Patient 11/25/21 1828   ?  (approximate) ? ? ?History  ? ?Fall ? ? ?HPI ? ?Zachary Hall is a 87 y.o. male who comes in for a fall coming down steps.  He denies LOC or headache.  He does not take any blood thinner.  Patient does report a little bit of dizziness that he felt this morning a little bit of dizziness since having the fall.  He denies any dizziness at rest just when he stands up.  He reports feeling at his baseline self at this time.  Denies any chest pain, shortness of breath, abdominal pain.  He is able to move his legs fully.  Unclear his last tetanus ? ? ? ? ?Physical Exam  ? ?Triage Vital Signs: ?ED Triage Vitals  ?Enc Vitals Group  ?   BP 11/25/21 1734 (!) 167/63  ?   Pulse Rate 11/25/21 1734 (!) 52  ?   Resp 11/25/21 1734 20  ?   Temp 11/25/21 1734 97.9 ?F (36.6 ?C)  ?   Temp Source 11/25/21 1734 Oral  ?   SpO2 11/25/21 1734 98 %  ?   Weight 11/25/21 1731 168 lb (76.2 kg)  ?   Height 11/25/21 1731 '5\' 10"'$  (1.778 m)  ?   Head Circumference --   ?   Peak Flow --   ?   Pain Score 11/25/21 1731 0  ?   Pain Loc --   ?   Pain Edu? --   ?   Excl. in [place]? --   ? ? ?Most recent vital signs: ?Vitals:  ? 11/25/21 1734  ?BP: (!) 167/63  ?Pulse: (!) 52  ?Resp: 20  ?Temp: 97.9 ?F (36.6 ?C)  ?SpO2: 98%  ? ? ? ?General: Awake, no distress.  ?CV:  Good peripheral perfusion.  ?Resp:  Normal effort.  ?Abd:  No distention.  ?Other:  Patient has a large Y-shaped laceration on the back of his head. ? ? ?ED Results / Procedures / Treatments  ? ?Labs ?(all labs ordered are listed, but only abnormal results are displayed) ?Labs Reviewed  ?CBC WITH DIFFERENTIAL/PLATELET - Abnormal; Notable for the following components:  ?    Result Value  ? RBC 3.41 (*)   ? Hemoglobin 11.3 (*)   ? HCT 34.1 (*)   ? All other components within normal limits  ?COMPREHENSIVE METABOLIC PANEL - Abnormal; Notable for the following  components:  ? Glucose, Bld 109 (*)   ? BUN 33 (*)   ? Creatinine, Ser 1.41 (*)   ? Total Protein 6.2 (*)   ? AST 14 (*)   ? GFR, Estimated 49 (*)   ? All other components within normal limits  ?TROPONIN I (HIGH SENSITIVITY)  ?TROPONIN I (HIGH SENSITIVITY)  ? ? ? ?EKG ? ?My interpretation of EKG: ? 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 ?Similar priot  ? ?RADIOLOGY ?CT head personally reviewed without any intracranial hemorrhage ? ?Mild midline posterior parietal scalp soft tissue swelling near the ?vertex without evidence of an acute fracture or acute intracranial ?abnormality. ?PROCEDURES: ? ?Critical Care performed: No ? ?.1-3 Lead EKG Interpretation ?Performed by: Vanessa [place], MD ?Authorized by: Vanessa , MD  ? ?  Interpretation: abnormal   ?  ECG  rate:  50 ?  ECG rate assessment: normal   ?  Rhythm: sinus bradycardia   ?  Ectopy: none   ?  Conduction: normal   ?..Laceration Repair ? ?Date/Time: 11/25/2021 9:23 PM ?Performed by: Vanessa [place], MD ?Authorized by: Vanessa [place], MD  ? ?Consent:  ?  Consent obtained:  Verbal ?  Consent given by:  Patient ?  Risks discussed:  Infection and pain ?  Alternatives discussed:  No treatment ?Universal protocol:  ?  Patient identity confirmed:  Verbally with patient ?Anesthesia:  ?  Anesthesia method:  Local infiltration ?  Local anesthetic:  Lidocaine 1% WITH epi ?Laceration details:  ?  Location:  Scalp ?  Length (cm):  8 ?  Depth (mm):  2 ?Exploration:  ?  Hemostasis achieved with:  Epinephrine ?  Wound extent: no areolar tissue violation noted   ?  Contaminated: no   ?Treatment:  ?  Area cleansed with:  Saline ?  Amount of cleaning:  Standard ?Skin repair:  ?  Repair method:  Staples ?  Number of staples:  10 ?Approximation:  ?  Approximation:  Close ?Repair type:  ?  Repair type:  Simple ?Post-procedure details:  ?  Dressing:  Non-adherent  dressing ?  Procedure completion:  Tolerated ? ? ?MEDICATIONS ORDERED IN ED: ?Medications - No data to display ? ? ?IMPRESSION / MDM / ASSESSMENT AND PLAN / ED COURSE  ?I reviewed the triage vital signs and the nursing notes. ? ? ?Patient comes in with a mechanical fall but maybe some dizziness so discussed with family and they would like to proceed with left blood work.  Significant laceration to the back of the head repaired with staples. ? ?Labs show stable white count.  Hemoglobin stable.  CBC shows stable kidney function.  Troponin is negative and symptoms have been going on for greater than 3 hours doubt ACS given no chest pain or shortness of breath.  Patient is really wanting to go home and denies any recurrent dizziness.  He has been ambulatory without any symptoms with walker.  We discussed using a walker at home for the next few days and getting staples removed in 10 days-14 days. ? ?CT imaging reassuring ? ?The patient is on the cardiac monitor to evaluate for evidence of arrhythmia and/or significant heart rate changes. ? ?  ? ? ?FINAL CLINICAL IMPRESSION(S) / ED DIAGNOSES  ? ?Final diagnoses:  ?Fall, initial encounter  ?Laceration of scalp, initial encounter  ? ? ? ?Rx / DC Orders  ? ?ED Discharge Orders   ? ? None  ? ?  ? ? ? ?Note:  This document was prepared using Dragon voice recognition software and may include unintentional dictation errors. ?  ?Vanessa Prue, MD ?11/25/21 2144 ? ?

## 2021-11-25 NOTE — ED Notes (Signed)
Patient ambulate around the room with walker, steady gait observed. MD notified and aware ?

## 2021-12-06 ENCOUNTER — Ambulatory Visit: Payer: Medicare Other | Admitting: Physician Assistant

## 2021-12-06 ENCOUNTER — Encounter: Payer: Self-pay | Admitting: Physician Assistant

## 2021-12-06 VITALS — BP 111/67 | HR 56 | Resp 18 | Ht 71.0 in | Wt 166.0 lb

## 2021-12-06 DIAGNOSIS — G309 Alzheimer's disease, unspecified: Secondary | ICD-10-CM

## 2021-12-06 DIAGNOSIS — F0280 Dementia in other diseases classified elsewhere without behavioral disturbance: Secondary | ICD-10-CM | POA: Diagnosis not present

## 2021-12-06 MED ORDER — MEMANTINE HCL 10 MG PO TABS: ORAL_TABLET | ORAL | 3 refills | Status: DC

## 2021-12-06 NOTE — Patient Instructions (Addendum)
It was a pleasure to see you today at our office.  ? ?Recommendations: ? ?Follow up in 6  months ?Continue Memantine 10 mg twice daily.Side effects were discussed  ?Discussed safety both in and out of the home.  ?Discussed the importance of regular daily schedule to maintain brain function.  ?Continue to monitor mood by geriatric doctor ?Stay active at least 30 minutes at least 3 times a week.  ?Naps should be scheduled and should be no longer than 60 minutes and should not occur after 2 PM.  ?Control cardiovascular risk factors  ?Handicap placard given ? ?Whom to call: ? ?Memory  decline, memory medications: Call our office [phone]  ? ?For psychiatric meds, mood meds: Please have your primary care physician manage these medications.  ? ?Counseling regarding caregiver distress, including caregiver depression, anxiety and issues regarding community resources, adult day care programs, adult living facilities, or memory care questions:   Feel free to contact [place], Social Worker at [phone] ?  ?For assessment of decision of mental capacity and competency:  Call Dr. Anthoney Harada, geriatric psychiatrist at [phone] ? ?For guidance in geriatric dementia issues please call [facility] [phone] ? ?For guidance regarding [facility] and if placement were needed at the facility, contact Arnell Asal, Social Worker tel: [phone] ? ?If you have any severe symptoms of a stroke, or other severe issues such as confusion,severe chills or fever, etc call 911 or go to the ER as you may need to be evaluate further ? ? ?Feel free to visit Facebook page " [email or web address]" for tips of how to care for people with memory problems.  ? ?  ?  ? ? ?RECOMMENDATIONS FOR ALL PATIENTS WITH MEMORY PROBLEMS: ?1. Continue to exercise (Recommend 30 minutes of walking everyday, or 3 hours every week) ?2. Increase social interactions - continue going to [place] and enjoy social gatherings  with friends and family ?3. Eat healthy, avoid fried foods and eat more fruits and vegetables ?4. Maintain adequate blood pressure, blood sugar, and blood cholesterol level. Reducing the risk of stroke and cardiovascular disease also helps promoting better memory. ?5. Avoid stressful situations. Live a simple life and avoid aggravations. Organize your time and prepare for the next day in anticipation. ?6. Sleep well, avoid any interruptions of sleep and avoid any distractions in the bedroom that may interfere with adequate sleep quality ?7. Avoid sugar, avoid sweets as there is a strong link between excessive sugar intake, diabetes, and cognitive impairment ?We discussed the Mediterranean diet, which has been shown to help patients reduce the risk of progressive memory disorders and reduces cardiovascular risk. This includes eating fish, eat fruits and green leafy vegetables, nuts like almonds and hazelnuts, walnuts, and also use olive oil. Avoid fast foods and fried foods as much as possible. Avoid sweets and sugar as sugar use has been linked to worsening of memory function. ? ?There is always a concern of gradual progression of memory problems. If this is the case, then we may need to adjust level of care according to patient needs. Support, both to the patient and caregiver, should then be put into place.  ? ? ?The Alzheimer?s Association is here all day, every day for people facing Alzheimer?s disease through our free 24/7 Helpline: [phone]. The Helpline provides reliable information and support to all those who need assistance, such as individuals living with memory loss, Alzheimer's or other dementia, caregivers, health care professionals and the public.  ?Our highly  trained and knowledgeable staff can help you with: ?Understanding memory loss, dementia and Alzheimer's  ?Medications and other treatment options  ?General information about aging and brain health  ?Skills to provide quality care and to find  the best care from professionals  ?Legal, financial and living-arrangement decisions ?Our Helpline also features: ?Confidential care consultation provided by master's level clinicians who can help with decision-making support, crisis assistance and education on issues families face every day  ?Help in a caller's preferred language using our translation service that features more than 200 languages and dialects  ?Referrals to local community programs, services and ongoing support ? ? ? ? ?FALL PRECAUTIONS: Be cautious when walking. Scan the area for obstacles that may increase the risk of trips and falls. When getting up in the mornings, sit up at the edge of the bed for a few minutes before getting out of bed. Consider elevating the bed at the head end to avoid drop of blood pressure when getting up. Walk always in a well-lit room (use night lights in the walls). Avoid area rugs or power cords from appliances in the middle of the walkways. Use a walker or a cane if necessary and consider physical therapy for balance exercise. Get your eyesight checked regularly. ? ?FINANCIAL OVERSIGHT: Supervision, especially oversight when making financial decisions or transactions is also recommended. ? ?HOME SAFETY: Consider the safety of the kitchen when operating appliances like stoves, microwave oven, and blender. Consider having supervision and share cooking responsibilities until no longer able to participate in those. Accidents with firearms and other hazards in the house should be identified and addressed as well. ? ? ?ABILITY TO BE LEFT ALONE: If patient is unable to contact 911 operator, consider using [facility], or when the need is there, arrange for someone to stay with patients. Smoking is a fire hazard, consider supervision or cessation. Risk of wandering should be assessed by caregiver and if detected at any point, supervision and safe proof recommendations should be instituted. ? ?MEDICATION SUPERVISION: Inability  to self-administer medication needs to be constantly addressed. Implement a mechanism to ensure safe administration of the medications. ? ? ?DRIVING: Regarding driving, in patients with progressive memory problems, driving will be impaired. We advise to have someone else do the driving if trouble finding directions or if minor accidents are reported. Independent driving assessment is available to determine safety of driving. ? ? ?If you are interested in the driving assessment, you can contact the following: ? ?[employer] in [place] ? ?[facility] [phone] ? ?[facility] [phone] ? ?[facility] [phone] or [phone] ? ?  ? ? ?Mediterranean Diet ?A Mediterranean diet refers to food and lifestyle choices that are based on the traditions of countries located on the [employer]. This way of eating has been shown to help prevent certain conditions and improve outcomes for people who have chronic diseases, like kidney disease and heart disease. ?What are tips for following this plan? ?Lifestyle  ?Cook and eat meals together with your family, when possible. ?Drink enough fluid to keep your urine clear or pale yellow. ?Be physically active every day. This includes: ?Aerobic exercise like running or swimming. ?Leisure activities like gardening, walking, or housework. ?Get 7-8 hours of sleep each night. ?If recommended by your health care provider, drink red wine in moderation. This means 1 glass a day for nonpregnant women and 2 glasses a day for men. A glass of wine equals 5 oz (150 mL). ?Reading food  labels  ?Check the serving size of packaged foods. For foods such as rice and pasta, the serving size refers to the amount of cooked product, not dry. ?Check the total fat in packaged foods. Avoid foods that have saturated fat or trans fats. ?Check the ingredients list for added sugars, such as corn syrup. ?Shopping  ?At the grocery store, buy  most of your food from the areas near the walls of the store. This includes: ?Fresh fruits and vegetables (produce). ?Grains, beans, nuts, and seeds. Some of these may be available in unpackaged forms o

## 2021-12-06 NOTE — Progress Notes (Signed)
Assessment/Plan:   Zachary Hall is a very pleasant 87 y.o. year old RH  male with a history of  hypertension, hyperlipidemia, CAD, paroxysmal atrial fibrillation, HOH, renal cell carcinoma s/p RA partial nephrectomy 7/22,malignant melanoma trunk, Stage 3 CKD initially seen at our office in 2017, at which time his MMSE was 28/30.  Other dementia medication was initiated with donepezil, but the patient was unable to tolerate it due to diarrhea.  He lost to follow-up due to other medical issues including renal cell carcinoma status post nephrectomy and malignant melanoma of the trunk.  He was again seen on 09/06/2021 at which time cognitive decline was noted, unable to perform MoCA.  MMSE was 21/30.  MRI of the brain on 09/25/2021 which was reviewed by me, was remarkable for moderate chronic small vessel ischemic changes within the cerebral white matter and pons, and mild to moderate cerebral atrophy with parenchymal volume loss especially affecting the medial temporal lobes and hippocampi.  Patient is on memantine 5 mg twice daily, tolerating well.  He is seen today in follow-up for memory loss. Since his last visit, his memory is about the same, but his wife is concerned about caregiver distress, would like to discuss this with Social Work.  She also needs to have a placard for handicap, as he has significant difficulty walking a few steps from his car when getting off of it.  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  Increase memantine to 10 mg twice daily, given the declining his memory.  Side effects discussed. Folllow up in 3 months We will provide a handicap placard. He is wife was instructed to make a phone call to [facility], Social Worker, to discuss dementia counseling, including caregiver distress, community resources, etc  Subjective:   The patient is seen in neurologic consultation at the request of [place], Eula Fried, F* for the evaluation  of memory.  The patient is accompanied by his wife who supplements the history.Since his last visit, the wife reports that he has been some decline in his memory, especially the short-term, with decreasing comprehension and lack of insight.  He has a hard time understanding instructions for steps.  He continues to lose objects and cannot remember where he left them.  He also confuses the names of objects.  His mood is about the same as prior, slightly more irritable, and getting distracted easily.  "He does not want to be told what to do ".  He is weaker than prior, and has refused to use a walker although his wife is insisting upon it.   He has fallen in the recent past, hitting his head on 11/25/2021, landing a few steps on the stairs without loss of consciousness, [facility]  where a CT of the head and the C-spine were negative for fractures or other lesions.  His wife says that he mumbles a lot.  She is in charge of the finances, medications and driving.  He does not cook.  His appetite is fair, denies any trouble swallowing.  She does not sleep in the same room with him, so she is not aware of any REM behavior or hallucinations that the patient may have.  He denies any paranoia with 1 exception "he is convinced that she is going to leave him for somebody younger "..  There are no hygiene concerns, he is independent of dressing and bathing.  He denies any headaches, dizziness, diplopia, dysarthria, dysphagia, neck or back  pain, focal numbness or tingling, weakness.  He may be slightly more incontinent of urine, and wants to eat mostly sugar rich foods.  He denies any tremors.      HISTORY OF PRESENT ILLNESS   I had the pleasure of seeing Zachary Hall in follow-up in the neurology clinic on 10/23/2017.  The patient was last seen a year ago for memory changes. He is again accompanied by his wife who helps supplement the history today.  TSH and B12 normal. I personally reviewed MRI brain which did not show  any acute changes, there was mild diffuse volume loss, moderate chronic microvascular disease. Since his last visit, he feels his memory is alright. His wife has noticed worsening. He loses things more and cannot find them readily. They are having their fridge fixed and he puts the paperwork in the same place but asks her each time where they are. She has noticed his processing is a little more off, it is "hard to get from point A to point B. When they are having a conversation, he would be talking about a table but calling it a door. He gets distracted driving more easily, he gets very upset at his wife if she yells that he is about to hit the car in front of them. She reports he has ran some red lights without noticing it. His wife manages finances and fixes his pillbox, she notices he occasionally misses a dose. She has also noticed more mood changes, he gets irritated with her more easily than with other people. She states he is more dependent on her, she worries when she goes out on her trips. He is even more stubborn. He states today that "you want to put me in the funny farm." No difficulties with dressing/bathing, no paranoia/hallucinations. He denies any headaches, dizziness, diplopia, dysarthria, dysphagia, neck/back pain, focal numbness/tingling/weakness, bowel/bladder dysfunction, or tremors.  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  He drinks 2 beers at night.  He does not smoke.   HPI 09/14/15: This is an 87 yo RH man with a history of CAD s/p CABG, bifascicular block, hyperlipidemia, remote history of paroxysmal atrial fibrillation, hypertension with worsening memory. He feels his memory is "pretty good," but does notice more short-term memory difficulties. He is a retired Psychologist, occupational. His wife started noticing memory changes after his heart surgery in 2011, but feels that changes are more of a personality change. He is more irritable, sometimes ignoring his wife even if she is speaking right in front of him  ("selective hearing"). He occasionally repeats himself. He and his wife deny getting lost driving, but she feels like his mind wanders when he is driving, sometimes not paying attention to what is in front of him when he gets distracted. His wife reports changes are "lots of little changes," he would have difficulty changing from Plan A to Plan B, in the past he used to be quick to decide. His wife fixes his pillbox for him and sometimes notices he skips a day. She is in charge of bill payments. She denies any paranoia, but feels that he has low self-esteem, saying "oh I'm just a bad person," or "I can't do anything right." When he was in cardiac rehab, he reports that therapists kept asking him if he was depressed. He denies any family history of dementia, no significant head injuries. He has reduced alcohol intake but wife reports he drinks a small glass of Christiane Ha every night.  He has had anosmia for the  past 4-5 years.  Allergies  Allergen Reactions   Lisinopril Other (See Comments)    Cough    Current Outpatient Medications  Medication Instructions   amLODipine (NORVASC) 10 MG tablet TAKE 1 TABLET BY MOUTH EVERY DAY   ammonium lactate (AMLACTIN) 1 g, Topical, As needed   atorvastatin (LIPITOR) 10 mg, Oral, Daily-1800   cetirizine (ZYRTEC) 10 mg, Oral, Daily   docusate sodium (COLACE) 100 mg, Oral, 2 times daily   hydrochlorothiazide (MICROZIDE) 12.5 MG capsule TAKE 1 CAPSULE BY MOUTH EVERY DAY   losartan (COZAAR) 50 MG tablet TAKE 1 TABLET BY MOUTH EVERY DAY   memantine (NAMENDA) 10 MG tablet 1 tablet twice a day   mupirocin ointment (BACTROBAN) 2 % 1 application., 2 times daily   nitroGLYCERIN (NITROSTAT) 0.4 MG SL tablet *PLACE 1 TABLET UNDER TONGUE EVERY 5 MINS., UP TO 3 DOSES AS NEEDED FOR CHEST PAIN*   omeprazole (PRILOSEC) 40 mg, Oral, Daily before breakfast   promethazine (PHENERGAN) 12.5 mg, Oral, Every 4 hours PRN   traMADol (ULTRAM) 50-100 mg, Oral, Every 6 hours PRN      VITALS:   Vitals:   12/06/21 1350  BP: 111/67  Pulse: (!) 56  Resp: 18  SpO2: 98%  Weight: 166 lb (75.3 kg)  Height: 5\' 11"  (1.803 m)        View : No data to display.          PHYSICAL EXAM   HEENT:  Normocephalic, some staples are noted in the parieto-occipital area, the area where he fell, well-healed, there is no drainage noted in the region.. The mucous membranes are moist. The superficial temporal arteries are without ropiness or tenderness. Cardiovascular: Regular rate and rhythm. Lungs: Clear to auscultation bilaterally. Neck: There are no carotid bruits noted bilaterally.  NEUROLOGICAL:     View : No data to display.            09/07/2021   11:00 AM 10/23/2017    1:00 PM 09/29/2016    2:00 PM  MMSE - Mini Mental State Exam  Orientation to time 3 4 4   Orientation to Place 5 5 5   Registration 3 3 3   Attention/ Calculation 4 4 5   Recall 0 0 0  Language- name 2 objects 2 2 2   Language- repeat 1 1 1   Language- follow 3 step command 2 3 3   Language- read & follow direction 1 1 1   Write a sentence 0 1 1  Copy design 0 1 1  Total score 21 25 26         View : No data to display.           Orientation:  Alert and oriented to person not to place or date.  The year is 2005.  No aphasia or dysarthria. Fund of knowledge is reduced.  Recent memory and remote memory impaired.  Attention and concentration are reduced.  Unable to name objects or repeat phrases.  Delayed recall 0/3.   Cranial nerves: There is good facial symmetry. Extraocular muscles are intact and visual fields are full to confrontational testing. Speech is fluent and clear. Soft palate rises symmetrically and there is no tongue deviation. Hearing is intact to conversational tone. Tone: Tone is good throughout. Sensation: Intact to light touch and pinprick throughout, vibration is intact at the bilateral big toe.  No extinction with double simultaneous stimulation.  No sensory dermatomal level  identified.  Coordination: The patient has no difficulty with RAMS or FNF bilaterally,  normal finger-to-nose.    Motor: Strength is 5 out of 5 in the bilateral upper and lower extremities, no pronator drift, no fasciculations noted.  DTR's: 2/4 at the bilateral triceps, biceps, brachioradialis, patella and Achilles, plantar responses are downgoing bilaterally.  Gait and Station: Patient is able to ambulate without difficulty, but he is very cautious, small steps.  He is unable to heel toe without difficulty.  He is able to ambulate in tandem fashion.  He is able to stand in the Romberg position.      Thank you for allowing [place] the opportunity to participate in the care of this nice patient. Please do not hesitate to contact us for any questions or concerns.   Total time spent on today's visit was 32 minutes, including face-to-face and non-face-to-face time, time included that spent on review of records (prior notes available to me, labs, imaging reviewed by me, as well as discussing the treatment and goals, answering patient's questions and coordinating care).     Cc:  Elenore Paddy, NP  Marlowe Kays 12/06/2021 3:48 PM

## 2021-12-28 NOTE — Progress Notes (Signed)
Cardiology Office Note   Date:  01/02/2022   ID:  ERCIL CASSIS, DOB [birth year], MRN [id number]  PCP:  None Cardiologist: Dallis Czaja [name] MD  Chief Complaint  Patient presents with   Coronary Artery Disease       History of Present Illness: Zachary Hall is a 87 y.o. male is seen for follow up CAD.  He has a history of known ischemic heart disease. He underwent coronary artery bypass graft surgery on 02/07/10 by Dr. Cyndia Bent. Last Myoview in February 2015 was normal with EF 66%.   His wife is concerned that he has more cognitive decline. Forgets things. Loses his hearing aids.  MRI showed chronic small vessel ischemic changes but no acute findings.   He did undergo left partial nephrectomy for a renal mass on February 25, 2021. 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  According to his wife his memory is worse and his energy is not good. He is eating well but likes sweets. Denies any chest pain or dyspnea. No edema or palpitations. No dizziness or syncope.    Past Medical History:  Diagnosis Date   Arthritis    Balance problem    Barrett esophagus    Bradycardia    Coronary artery disease    GERD (gastroesophageal reflux disease)    Gout    History of atrial fibrillation    Previously on amiodarone   Hx of CABG    Hypercholesterolemia    Hypertension    Left renal mass    Melanoma ([place])    Left shoulder blade   Nocturia    Right epiretinal membrane 02/10/2020    Past Surgical History:  Procedure Laterality Date   CARDIAC CATHETERIZATION  01/04/2010   COLONOSCOPY     CORONARY ARTERY BYPASS GRAFT  02/07/2010   LIMA to LAD, SVG to 2nd DX, SVG to OM   EYE SURGERY Bilateral    Cataract Extraction   HEMORRHOID SURGERY     x 2   HEMORRHOID SURGERY N/A 11/13/2012   Procedure: Peever Hemorrhoidectomy;  Surgeon: Merrie Roof, MD;  Location: [facility];  Service: General;  Laterality: N/A;   INGUINAL HERNIA REPAIR Right 01/22/2017   Procedure: RIGHT INGUINAL HERNIA  REPAIR WITH MESH;  Surgeon: Armandina Gemma, MD;  Location: [facility];  Service: General;  Laterality: Right;   INSERTION OF MESH Right 01/22/2017   Procedure: INSERTION OF MESH;  Surgeon: Armandina Gemma, MD;  Location: [facility];  Service: General;  Laterality: Right;   IR RADIOLOGIST EVAL & MGMT  01/06/2021   NASAL POLYP EXCISION     OTHER SURGICAL HISTORY  06/05/2002   Penile Implant   ROBOTIC ASSITED PARTIAL NEPHRECTOMY Left 02/25/2021   Procedure: XI ROBOTIC ASSITED PARTIAL NEPHRECTOMY;  Surgeon: Ceasar Mons, MD;  Location: [facility];  Service: Urology;  Laterality: Left;   UPPER GI ENDOSCOPY       Current Outpatient Medications  Medication Sig Dispense Refill   amLODipine (NORVASC) 10 MG tablet TAKE 1 TABLET BY MOUTH EVERY DAY 90 tablet 1   ammonium lactate (AMLACTIN) 12 % cream Apply 1 g topically as needed for dry skin.     atorvastatin (LIPITOR) 10 MG tablet TAKE 1 TABLET (10 MG TOTAL) BY MOUTH DAILY AT 6 PM. 90 tablet 1   cetirizine (ZYRTEC) 10 MG tablet Take 10 mg by mouth daily.     docusate sodium (COLACE) 100 MG capsule Take 1 capsule (100 mg total) by mouth 2 (two)  times daily.     hydrochlorothiazide (MICROZIDE) 12.5 MG capsule TAKE 1 CAPSULE BY MOUTH EVERY DAY 90 capsule 1   losartan (COZAAR) 50 MG tablet TAKE 1 TABLET BY MOUTH EVERY DAY 90 tablet 1   memantine (NAMENDA) 10 MG tablet 1 tablet twice a day 180 tablet 3   mupirocin ointment (BACTROBAN) 2 % Apply 1 application. topically 2 (two) times daily.     nitroGLYCERIN (NITROSTAT) 0.4 MG SL tablet *PLACE 1 TABLET UNDER TONGUE EVERY 5 MINS., UP TO 3 DOSES AS NEEDED FOR CHEST PAIN* 25 tablet 2   omeprazole (PRILOSEC) 40 MG capsule Take 40 mg by mouth daily before breakfast.     promethazine (PHENERGAN) 12.5 MG tablet Take 1 tablet (12.5 mg total) by mouth every 4 (four) hours as needed for nausea or vomiting. 15 tablet 0   traMADol (ULTRAM) 50 MG tablet Take 1-2 tablets (50-100 mg total) by mouth every 6 (six) hours as  needed for moderate pain or severe pain. 20 tablet 0   No current facility-administered medications for this visit.    Allergies:   Lisinopril    Social History:  The patient  reports that he quit smoking about 61 years ago. His smoking use included cigarettes. He has quit using smokeless tobacco. He reports current alcohol use of about 4.0 standard drinks per week. He reports that he does not use drugs.   Family History:  The patient's family history includes Alcohol abuse in his father; Cancer in his mother; Colon cancer in his mother.    ROS:  Please see the history of present illness.   Otherwise, review of systems are positive for none.   All other systems are reviewed and negative.    PHYSICAL EXAM: VS:  BP 117/66   Pulse (!) 56   Ht 5' 10.05" (1.779 m)   Wt 166 lb 12.8 oz (75.7 kg)   SpO2 99%   BMI 23.90 kg/m  , BMI Body mass index is 23.9 kg/m. GENERAL:  Well appearing elderly WM in NAD HEENT:  PERRL, EOMI, sclera are clear. Oropharynx is clear. NECK:  No jugular venous distention, carotid upstroke brisk and symmetric, no bruits, no thyromegaly or adenopathy LUNGS:  Clear to auscultation bilaterally CHEST:  Unremarkable HEART:  RRR,  PMI not displaced or sustained,S1 and S2 within normal limits, no S3, no S4: no clicks, no rubs, no murmurs ABD:  Soft, nontender. BS +, no masses or bruits. No hepatomegaly, no splenomegaly EXT:  2 + pulses throughout, no edema, no cyanosis no clubbing SKIN:  Warm and dry.  No rashes NEURO:  Alert and oriented x 3. Cranial nerves II through XII intact. PSYCH:  Cognitively intact  EKG:  EKG  Is not  ordered today.   Recent Labs: 09/06/2021: TSH 2.63 11/25/2021: ALT 13; BUN 33; Creatinine, Ser 1.41; Hemoglobin 11.3; Platelets 157; Potassium 4.0; Sodium 140    Lipid Panel    Component Value Date/Time   CHOL 112 11/24/2021 1045   TRIG 122 11/24/2021 1045   HDL 37 (L) 11/24/2021 1045   CHOLHDL 3.0 11/24/2021 1045   CHOLHDL 2.7  10/16/2016 1443   VLDL 32 (H) 10/16/2016 1443   LDLCALC 53 11/24/2021 1045    Dated 11/01/20: CMET normal.  Wt Readings from Last 3 Encounters:  01/02/22 166 lb 12.8 oz (75.7 kg)  12/06/21 166 lb (75.3 kg)  11/25/21 168 lb (76.2 kg)     ASSESSMENT AND PLAN:  1.  Ischemic heart disease status post CABG  2011. Patient is asymptomatic. Last Myoview in Feb. 2015 was normal. Continue current medical therapy.  2.  RBBB with sinus bradycardia. Chronic- asymptomatic. Will monitor. On no rate slowing therapy 3. Hypercholesterolemia. Excellent control on statin.  4. Remote history of paroxysmal atrial fibrillation- no recurrence. 5. HTN controlled. 6.  Memory disorder followed by Neurology. Encourage follow up with Neuro. 7. Renal cell carcinoma. S/p partial nephrectomy. Encourage increased calorie intake with good protein source. 8. CKD stage 3a  Current medicines are reviewed at length with the patient today.  The patient does not have concerns regarding medicines.  The following changes have been made:  no change  Labs/ tests ordered today include:   No orders of the defined types were placed in this encounter.    Disposition: Continue current medication.  Follow up 6 months   Signed, Kayne Yuhas [name] MD, [facility]   01/02/2022 3:22 PM    [place]

## 2022-01-02 ENCOUNTER — Encounter: Payer: Self-pay | Admitting: Cardiology

## 2022-01-02 ENCOUNTER — Ambulatory Visit (INDEPENDENT_AMBULATORY_CARE_PROVIDER_SITE_OTHER): Payer: Medicare Other | Admitting: Cardiology

## 2022-01-02 VITALS — BP 117/66 | HR 56 | Ht 70.05 in | Wt 166.8 lb

## 2022-01-02 DIAGNOSIS — I4510 Unspecified right bundle-branch block: Secondary | ICD-10-CM | POA: Diagnosis not present

## 2022-01-02 DIAGNOSIS — I10 Essential (primary) hypertension: Secondary | ICD-10-CM | POA: Diagnosis not present

## 2022-01-02 DIAGNOSIS — I25708 Atherosclerosis of coronary artery bypass graft(s), unspecified, with other forms of angina pectoris: Secondary | ICD-10-CM

## 2022-01-02 DIAGNOSIS — E7800 Pure hypercholesterolemia, unspecified: Secondary | ICD-10-CM

## 2022-01-02 NOTE — Patient Instructions (Signed)

## 2022-01-27 ENCOUNTER — Ambulatory Visit (INDEPENDENT_AMBULATORY_CARE_PROVIDER_SITE_OTHER): Payer: Medicare Other | Admitting: Nurse Practitioner

## 2022-01-27 VITALS — Temp 97.4°F | Ht 70.5 in | Wt 164.0 lb

## 2022-01-27 DIAGNOSIS — R739 Hyperglycemia, unspecified: Secondary | ICD-10-CM

## 2022-01-27 DIAGNOSIS — R2689 Other abnormalities of gait and mobility: Secondary | ICD-10-CM | POA: Insufficient documentation

## 2022-01-27 DIAGNOSIS — I10 Essential (primary) hypertension: Secondary | ICD-10-CM

## 2022-01-27 DIAGNOSIS — F0390 Unspecified dementia without behavioral disturbance: Secondary | ICD-10-CM

## 2022-01-27 LAB — HEMOGLOBIN A1C: Hgb A1c MFr Bld: 5.7 % (ref 4.6–6.5)

## 2022-01-27 NOTE — Assessment & Plan Note (Signed)
Patient to continue on Namenda as currently prescribed.  Wife encouraged to continue to offer healthy food choices such as lean protein, vegetables, fruits, whole grains.  Wife encouraged to offer durable medical equipment to patient such as cane and/or walker to help prevent falls.  They were encouraged to follow-up to the emergency department if patient were to fall again.  Will refer to physical therapy to assist in improving strength and balance.  Patient to continue following up with neurology as scheduled.

## 2022-01-27 NOTE — Assessment & Plan Note (Signed)
Wife encouraged to offer durable medical equipment to patient such as cane and/or walker to help prevent falls.  They were encouraged to follow-up to the emergency department if patient were to fall again.  Will refer to physical therapy to assist in improving strength and balance.

## 2022-01-27 NOTE — Assessment & Plan Note (Signed)
Chronic, currently well controlled patient to continue on antihypertensives as currently prescribed.

## 2022-01-27 NOTE — Assessment & Plan Note (Signed)
We will check A1c today, further recommendations may be made based upon these results.

## 2022-01-27 NOTE — Progress Notes (Signed)
Subjective   Patient ID: Zachary Hall, male    DOB: 04/04/1935  Age: 87 y.o. MRN: [id number]  Chief Complaint  Patient presents with   New patient    Requesting handicap plate    Patient arrives today to establish care accompanied by his wife.  He has fairly early stage dementia and she assists him at home.  She also reports that she is his POA.  He recently underwent surgery to remove tumor to his right kidney in July 2022.  He reports having had 2 falls in the last year, last one approximately occurred 2 months ago at which point he required staples to his scalp.  These were administered in the emergency department.  No fall since then however the wife reports signs of postural instability especially with changing directions or standing up from a chair.  She also reports he is experiencing more paranoia and is concerned that she is having an affair.  She also reports some mild anorexia, however he does enjoy eating sweets.  He is on memantine 10 mg daily.  She reports that they have clear walkways in the home, use night lights and hallways, have grab bars in the bathroom, avoid throw rugs.  He has hypertension for which she takes amlodipine 10 mg mouth daily, hydrochlorothiazide 2.5 mg daily and losartan 50 mg daily.  He also continues on atorvastatin 10 mg daily.  Per chart review he has had elevated glucose on metabolic panel.      Review of Systems  Respiratory:  Negative for shortness of breath.   Cardiovascular:  Negative for chest pain.  Neurological:  Negative for dizziness and headaches.       (+) falls      Objective:     Temp (!) 97.4 F (36.3 C) (Oral)   Ht 5' 10.5" (1.791 m)   Wt 164 lb (74.4 kg)   SpO2 96%   BMI 23.20 kg/m  BP: 116/64 BP Readings from Last 3 Encounters:  01/02/22 117/66  12/06/21 111/67  11/25/21 (!) 188/70   Wt Readings from Last 3 Encounters:  01/27/22 164 lb (74.4 kg)  01/02/22 166 lb 12.8 oz (75.7 kg)  12/06/21 166 lb (75.3 kg)       Physical Exam Vitals reviewed.  Constitutional:      Appearance: Normal appearance.  HENT:     Head: Normocephalic and atraumatic.  Cardiovascular:     Rate and Rhythm: Normal rate and regular rhythm.  Pulmonary:     Effort: Pulmonary effort is normal.     Breath sounds: Normal breath sounds.  Musculoskeletal:     Cervical back: Neck supple.  Skin:    General: Skin is warm and dry.  Neurological:     Mental Status: He is alert. Mental status is at baseline.     Motor: No tremor.     Comments: No rigidity noted, no tremor noted on exam (wife reports intermittent tremor), gait seemed to be shuffling at times but he was able to take normal steps at times  Psychiatric:        Mood and Affect: Mood normal.        Behavior: Behavior normal.        Thought Content: Thought content normal.        Judgment: Judgment normal.      No results found for any visits on 01/27/22.    The ASCVD Risk score (Arnett DK, et al., 2019) failed to calculate for the following  reasons:   The 2019 ASCVD risk score is only valid for ages 21 to 24    Assessment & Plan:   Problem List Items Addressed This Visit       Cardiovascular and Mediastinum   Essential hypertension     Nervous and Auditory   Dementia [facility])     Other   Balance problem - Primary   Relevant Orders   Ambulatory referral to Physical Therapy   Hyperglycemia   Relevant Orders   Hemoglobin A1c    Return in about 1 month (around 02/26/2022) for follow-up with Judson Roch.    Ailene Ards, NP

## 2022-02-02 DIAGNOSIS — D225 Melanocytic nevi of trunk: Secondary | ICD-10-CM | POA: Diagnosis not present

## 2022-02-02 DIAGNOSIS — Z85820 Personal history of malignant melanoma of skin: Secondary | ICD-10-CM | POA: Diagnosis not present

## 2022-02-02 DIAGNOSIS — L812 Freckles: Secondary | ICD-10-CM | POA: Diagnosis not present

## 2022-02-02 DIAGNOSIS — L570 Actinic keratosis: Secondary | ICD-10-CM | POA: Diagnosis not present

## 2022-02-02 DIAGNOSIS — L821 Other seborrheic keratosis: Secondary | ICD-10-CM | POA: Diagnosis not present

## 2022-02-02 DIAGNOSIS — L820 Inflamed seborrheic keratosis: Secondary | ICD-10-CM | POA: Diagnosis not present

## 2022-02-02 DIAGNOSIS — Z85828 Personal history of other malignant neoplasm of skin: Secondary | ICD-10-CM | POA: Diagnosis not present

## 2022-02-03 ENCOUNTER — Telehealth: Payer: Self-pay | Admitting: Physician Assistant

## 2022-02-03 NOTE — Telephone Encounter (Signed)
Patients wife called. Zachary Hall went to see his PCP last week, and she thinks Zachary Hall needs to see sara before his November appt.

## 2022-02-03 NOTE — Telephone Encounter (Signed)
Called patients wife voicemail is full

## 2022-02-07 NOTE — Telephone Encounter (Signed)
Called patients wife voicemail is full

## 2022-02-07 NOTE — Telephone Encounter (Signed)
Patients wife called me back. Patient after several reschedules was able to see NP Elyn Aquas. NP Elyn Aquas is a specialist in Geriatrics from what patients wife has told me. She was quite concerned with the patient. He is having delusions and the patient is also having hallucinations. PCP Pearline Cables feels patient needs to be put on a medication for these but did not feel comfortable putting him on them and wants him to be seen by a neurologist to assess this. NP Pearline Cables also told patient and his wife that patient has tremors and dementia so she is concerned that he also is suffering with Parkinsons Disease and would like him to be screened for that. I did tell the wife that is not what he is being seen for in our office and would need to speak to the doctors on the next step for that evaluation. PCP didn't feel confident in her treating patient since she is not a neuro doc and wants him to be seen as soon as possible

## 2022-02-07 NOTE — Telephone Encounter (Deleted)
Patients wife called me back. Patient after several reschedules was able to see NP Elyn Aquas. NP Elyn Aquas is a specialist in Geriatrics from what patients wife has told me. She was quite concerned with the patient. He is having delusions and the patient is also having hallucinations. PCP Pearline Cables feels patient needs to be put on a medication for these but did not feel comfortable putting him on them and wants him to be seen by a neurologist to assess this. NP Pearline Cables also told patient and his wife that patient has tremors and dementia so she is concerned that he also is suffering with Parkinsons Disease and would like him to be screened for that. I did tell the wife that is not what he is being seen for in our office and would need to speak to the doctors on the next step for that evaluation. PCP didn't feel confident in her treating patient since she is not a neuro doc and wants him to be seen as soon as possible

## 2022-02-08 NOTE — Telephone Encounter (Signed)
Ok to schedule earlier appt with Zachary Hall in August, keep Nov appt for now. Staff patient with me. Thanks

## 2022-02-23 ENCOUNTER — Ambulatory Visit (INDEPENDENT_AMBULATORY_CARE_PROVIDER_SITE_OTHER): Payer: Medicare Other | Admitting: Nurse Practitioner

## 2022-02-23 VITALS — BP 112/60 | HR 50 | Temp 98.2°F | Ht 70.5 in | Wt 161.0 lb

## 2022-02-23 DIAGNOSIS — F0390 Unspecified dementia without behavioral disturbance: Secondary | ICD-10-CM

## 2022-02-23 DIAGNOSIS — R5383 Other fatigue: Secondary | ICD-10-CM | POA: Insufficient documentation

## 2022-02-23 DIAGNOSIS — R634 Abnormal weight loss: Secondary | ICD-10-CM | POA: Diagnosis not present

## 2022-02-23 DIAGNOSIS — R2689 Other abnormalities of gait and mobility: Secondary | ICD-10-CM

## 2022-02-23 NOTE — Assessment & Plan Note (Signed)
Chronic, further labs ordered today for evaluation, further recommendations may be made based upon these results.

## 2022-02-23 NOTE — Progress Notes (Signed)
   Established Patient Office Visit  Subjective   Patient ID: Zachary Hall, male    DOB: 03/08/1935  Age: 87 y.o. MRN: [id number]  Chief Complaint  Patient presents with   81mo follow up    Discuss lab results    Patient arrives today for follow-up.  Last office visit blood work showed that he is prediabetic with A1c of 5.7.  His wife accompanies him today reports that she is concerned about his weight loss.  She reports that since his renal surgery for renal cancer approximately 1 year ago he has had a hard time regaining weight.  Per chart review I do see that over the past 5 months he has bad about a 5% weight loss unintentionally.    Per chart review I see evidence for chronic kidney disease (eGFR 49), protein was low at 6.2, mild anemia with hemoglobin of 11.3.  Per wife patient has not had any additional falls since last time he was seen by me about 1 month ago.  He has been referred to physical therapy to assist with balance training.  They report that he has not yet started but is scheduled to start physical therapy soon.  He has dementia and cognition appears to be at baseline, he has an appointment to follow-up with neurology coming up in the next couple of months.      Review of Systems  Constitutional:  Positive for malaise/fatigue and weight loss.  Respiratory:  Negative for shortness of breath.   Cardiovascular:  Negative for chest pain.      Objective:     BP 112/60 (BP Location: Left Arm, Patient Position: Sitting, Cuff Size: Large)   Pulse (!) 102   Temp 98.2 F (36.8 C) (Oral)   Ht 5' 10.5" (1.791 m)   Wt 161 lb (73 kg)   SpO2 96%   BMI 22.77 kg/m  BP Readings from Last 3 Encounters:  02/23/22 112/60  01/02/22 117/66  12/06/21 111/67   Wt Readings from Last 3 Encounters:  02/23/22 161 lb (73 kg)  01/27/22 164 lb (74.4 kg)  01/02/22 166 lb 12.8 oz (75.7 kg)      Physical Exam Vitals reviewed.  Constitutional:      Appearance: Normal  appearance.  HENT:     Head: Normocephalic and atraumatic.  Cardiovascular:     Rate and Rhythm: Regular rhythm. Bradycardia present.  Pulmonary:     Effort: Pulmonary effort is normal.     Breath sounds: Normal breath sounds.  Musculoskeletal:     Cervical back: Neck supple.  Skin:    General: Skin is warm and dry.  Neurological:     Mental Status: He is alert and oriented to person, place, and time.  Psychiatric:        Mood and Affect: Mood normal.        Behavior: Behavior normal.        Thought Content: Thought content normal.        Judgment: Judgment normal.      No results found for any visits on 02/23/22.    The ASCVD Risk score (Arnett DK, et al., 2019) failed to calculate for the following reasons:   The 2019 ASCVD risk score is only valid for ages 42 to 53    Assessment & Plan:   Problem List Items Addressed This Visit   None   No follow-ups on file.    Ailene Ards, NP

## 2022-02-23 NOTE — Assessment & Plan Note (Signed)
Etiology unclear, will order lab work for further evaluation.  Further recommendations may be made based upon results.  Consider abdominal and pelvic CT scan pending lab results.  For now recommend patient focus on eating 3 full meals a day plus boost or other protein shake twice a day in between meals.  Patient and wife reports her understanding.  May consider initiating patient on medication such as mirtazapine pending further work-up.

## 2022-02-23 NOTE — Assessment & Plan Note (Signed)
Chronic, patient encouraged to follow-up with neurology as scheduled.

## 2022-02-23 NOTE — Assessment & Plan Note (Signed)
Chronic, patient again encouraged to use DME to assist with balance and to prevent falls.  Patient encouraged to follow with PT as scheduled.

## 2022-03-01 DIAGNOSIS — R2681 Unsteadiness on feet: Secondary | ICD-10-CM | POA: Diagnosis not present

## 2022-03-03 DIAGNOSIS — R2681 Unsteadiness on feet: Secondary | ICD-10-CM | POA: Diagnosis not present

## 2022-03-06 DIAGNOSIS — R5383 Other fatigue: Secondary | ICD-10-CM | POA: Diagnosis not present

## 2022-03-06 DIAGNOSIS — R634 Abnormal weight loss: Secondary | ICD-10-CM | POA: Diagnosis not present

## 2022-03-06 LAB — FOLATE: Folate: >24.2 ng/mL (ref >5.9)

## 2022-03-06 LAB — CBC
HCT: 33.4 % — ABNORMAL LOW (ref 39.0–52.0)
Hemoglobin: 11.8 g/dL — ABNORMAL LOW (ref 13.0–17.0)
MCHC: 35.4 g/dL (ref 30.0–36.0)
MCV: 97 fl (ref 78.0–100.0)
Platelets: 143 10*3/uL — ABNORMAL LOW (ref 150.0–400.0)
RBC: 3.44 Mil/uL — ABNORMAL LOW (ref 4.22–5.81)
RDW: 12.9 % (ref 11.5–15.5)
WBC: 8.9 10*3/uL (ref 4.0–10.5)

## 2022-03-06 LAB — T4, FREE: Free T4: 0.81 ng/dL (ref 0.60–1.60)

## 2022-03-06 LAB — TSH: TSH: 2.78 u[IU]/mL (ref 0.35–5.50)

## 2022-03-06 LAB — T3, FREE: T3, Free: 2.9 pg/mL (ref 2.3–4.2)

## 2022-03-06 LAB — FERRITIN: Ferritin: 26.8 ng/mL (ref 22.0–322.0)

## 2022-03-06 LAB — SEDIMENTATION RATE: Sed Rate: 4 mm/hr (ref 0–20)

## 2022-03-06 LAB — VITAMIN D 25 HYDROXY (VIT D DEFICIENCY, FRACTURES): VITD: 43.4 ng/mL (ref 30.00–100.00)

## 2022-03-06 LAB — VITAMIN B12: Vitamin B-12: 641 pg/mL (ref 211–911)

## 2022-03-07 LAB — COMPREHENSIVE METABOLIC PANEL
ALT: 14 U/L (ref 0–53)
AST: 14 U/L (ref 0–37)
Albumin: 4.2 g/dL (ref 3.5–5.2)
Alkaline Phosphatase: 57 U/L (ref 39–117)
BUN: 31 mg/dL — ABNORMAL HIGH (ref 6–23)
CO2: 29 mEq/L (ref 19–32)
Calcium: 10 mg/dL (ref 8.4–10.5)
Chloride: 102 mEq/L (ref 96–112)
Creatinine, Ser: 1.48 mg/dL (ref 0.40–1.50)
GFR: 42.88 mL/min — ABNORMAL LOW (ref >60.00)
Glucose, Bld: 95 mg/dL (ref 70–99)
Potassium: 3.9 mEq/L (ref 3.5–5.1)
Sodium: 144 mEq/L (ref 135–145)
Total Bilirubin: 0.4 mg/dL (ref 0.2–1.2)
Total Protein: 6.5 g/dL (ref 6.0–8.3)

## 2022-03-07 LAB — C-REACTIVE PROTEIN: CRP: <1 mg/dL (ref 0.5–20.0)

## 2022-03-07 LAB — IRON: Iron: 109 ug/dL (ref 42–165)

## 2022-03-08 DIAGNOSIS — R2681 Unsteadiness on feet: Secondary | ICD-10-CM | POA: Diagnosis not present

## 2022-03-08 LAB — PSA, TOTAL WITH REFLEX TO PSA, FREE: PSA, Total: 5.4 ng/mL — ABNORMAL HIGH (ref <=4.0)

## 2022-03-08 LAB — REFLEX PSA, FREE
PSA, % Free: 22 % (calc) — ABNORMAL LOW (ref >25)
PSA, Free: 1.2 ng/mL

## 2022-03-09 ENCOUNTER — Telehealth: Payer: Medicare Other | Admitting: Nurse Practitioner

## 2022-03-09 ENCOUNTER — Telehealth: Payer: Self-pay | Admitting: Nurse Practitioner

## 2022-03-09 ENCOUNTER — Telehealth: Payer: Self-pay

## 2022-03-09 NOTE — Telephone Encounter (Signed)
Pt wife was returning [place] call about results.  Please call wife back as soon as possible  Pt CB [phone]

## 2022-03-09 NOTE — Telephone Encounter (Signed)
I called patient's wife to discuss his lab results.  I recommend he seek consultation with urology to rule out prostate cancer considering his unintentional weight loss and mildly elevated PSA with low free percentage.  Wife reports understanding and states she will reach out to his current urologist Dr. Lovena Neighbours.

## 2022-03-14 DIAGNOSIS — R2681 Unsteadiness on feet: Secondary | ICD-10-CM | POA: Diagnosis not present

## 2022-03-16 DIAGNOSIS — R2681 Unsteadiness on feet: Secondary | ICD-10-CM | POA: Diagnosis not present

## 2022-03-21 DIAGNOSIS — R2681 Unsteadiness on feet: Secondary | ICD-10-CM | POA: Diagnosis not present

## 2022-03-23 DIAGNOSIS — R2681 Unsteadiness on feet: Secondary | ICD-10-CM | POA: Diagnosis not present

## 2022-03-27 DIAGNOSIS — Z961 Presence of intraocular lens: Secondary | ICD-10-CM | POA: Diagnosis not present

## 2022-03-27 DIAGNOSIS — H52203 Unspecified astigmatism, bilateral: Secondary | ICD-10-CM | POA: Diagnosis not present

## 2022-03-27 DIAGNOSIS — H35371 Puckering of macula, right eye: Secondary | ICD-10-CM | POA: Diagnosis not present

## 2022-03-27 DIAGNOSIS — H524 Presbyopia: Secondary | ICD-10-CM | POA: Diagnosis not present

## 2022-03-30 DIAGNOSIS — R2681 Unsteadiness on feet: Secondary | ICD-10-CM | POA: Diagnosis not present

## 2022-04-13 ENCOUNTER — Ambulatory Visit (INDEPENDENT_AMBULATORY_CARE_PROVIDER_SITE_OTHER): Payer: Medicare Other | Admitting: Nurse Practitioner

## 2022-04-13 VITALS — BP 124/68 | HR 51 | Temp 97.9°F | Ht 70.0 in | Wt 164.1 lb

## 2022-04-13 DIAGNOSIS — R634 Abnormal weight loss: Secondary | ICD-10-CM | POA: Diagnosis not present

## 2022-04-13 DIAGNOSIS — W19XXXA Unspecified fall, initial encounter: Secondary | ICD-10-CM | POA: Insufficient documentation

## 2022-04-13 DIAGNOSIS — W19XXXS Unspecified fall, sequela: Secondary | ICD-10-CM | POA: Diagnosis not present

## 2022-04-13 DIAGNOSIS — D649 Anemia, unspecified: Secondary | ICD-10-CM

## 2022-04-13 DIAGNOSIS — D696 Thrombocytopenia, unspecified: Secondary | ICD-10-CM | POA: Diagnosis not present

## 2022-04-13 NOTE — Progress Notes (Signed)
Established Patient Office Visit  Subjective   Patient ID: Zachary Hall, male    DOB: 03-Aug-1934  Age: 87 y.o. MRN: [id number]  Chief Complaint  Patient presents with   Follow-up   Weight Check    Patient arrives today accompanied by his wife.  Unintentional weight loss: Last office visit recommended focusing on increasing intake of food.  Patient has done so with assistance from wife.  He is here to have his weight checked today.  Currently not on any pharmacological treatment to assist with weight gain.  Thrombocytopenia: Incidental finding of platelets level of 143 last time CBC was collected.  Falls: Wife reports patient had a fall since he was seen at last office visit.  He did hit his head, but they did not proceed to the emergency department.  Wife continues to encourage patient to use cane however he is hesitant to do so.  Patient did work with physical therapy and did not feel it was very helpful, they have since discontinued working with them.    Review of Systems  Respiratory:  Negative for shortness of breath.   Cardiovascular:  Negative for chest pain.  Musculoskeletal:  Positive for falls.      Objective:     BP 124/68 (BP Location: Left Arm, Patient Position: Sitting, Cuff Size: Normal)   Pulse (!) 51   Temp 97.9 F (36.6 C) (Oral)   Ht '5\' 10"'$  (1.778 m)   Wt 164 lb 2 oz (74.4 kg)   SpO2 97%   BMI 23.55 kg/m  BP Readings from Last 3 Encounters:  04/13/22 124/68  02/23/22 112/60  01/02/22 117/66   Wt Readings from Last 3 Encounters:  04/13/22 164 lb 2 oz (74.4 kg)  02/23/22 161 lb (73 kg)  01/27/22 164 lb (74.4 kg)      Physical Exam Vitals reviewed.  Constitutional:      Appearance: Normal appearance.  HENT:     Head: Normocephalic and atraumatic.  Cardiovascular:     Rate and Rhythm: Normal rate and regular rhythm.  Pulmonary:     Effort: Pulmonary effort is normal.     Breath sounds: Normal breath sounds.  Musculoskeletal:      Cervical back: Neck supple.  Skin:    General: Skin is warm and dry.  Neurological:     Mental Status: He is alert and oriented to person, place, and time.  Psychiatric:        Mood and Affect: Mood normal.        Behavior: Behavior normal.        Thought Content: Thought content normal.        Judgment: Judgment normal.      No results found for any visits on 04/13/22.    The ASCVD Risk score (Arnett DK, et al., 2019) failed to calculate for the following reasons:   The 2019 ASCVD risk score is only valid for ages 39 to 78    Assessment & Plan:  In addition to plan of care as detailed below I did ask wife about urology status when patient's PSA level came back elevated.  Urologist did not recommend additional evaluation based on mildly elevated PSA. Problem List Items Addressed This Visit       Hematopoietic and Hemostatic   Thrombocytopenia ([place]) - Primary    Incidental and mild, recheck CBC, patient has also had low hemoglobin so we will check iron and ferritin as well.  Previous vitamin B12 within normal limits.  Further recommendations may be made based upon results.      Relevant Orders   CBC   Iron   Ferritin     Other   Unintentional weight loss   Fall    Provided education regarding avoidance of tripping hazards in the home, and encouraged patient again to use assistive device such as cane or walker.  Patient seems to be more willing to do so at this time.  Encouraged him to proceed to the emergency department for falls and hits his head again.  He also plans on following up with neurology as scheduled for evaluation of his dementia and possibly discuss whether or not he may have Parkinson's.      Other Visit Diagnoses     Anemia, unspecified type       Relevant Orders   Iron   Ferritin       Return in about 3 months (around 07/13/2022) for f/u Paulla Mcclaskey.    Ailene Ards, NP

## 2022-04-13 NOTE — Assessment & Plan Note (Signed)
Provided education regarding avoidance of tripping hazards in the home, and encouraged patient again to use assistive device such as cane or walker.  Patient seems to be more willing to do so at this time.  Encouraged him to proceed to the emergency department for falls and hits his head again.  He also plans on following up with neurology as scheduled for evaluation of his dementia and possibly discuss whether or not he may have Parkinson's.

## 2022-04-13 NOTE — Assessment & Plan Note (Signed)
Incidental and mild, recheck CBC, patient has also had low hemoglobin so we will check iron and ferritin as well.  Previous vitamin B12 within normal limits.  Further recommendations may be made based upon results.

## 2022-04-16 ENCOUNTER — Other Ambulatory Visit: Payer: Self-pay | Admitting: Cardiology

## 2022-05-04 ENCOUNTER — Other Ambulatory Visit (INDEPENDENT_AMBULATORY_CARE_PROVIDER_SITE_OTHER): Payer: Medicare Other

## 2022-05-04 DIAGNOSIS — D649 Anemia, unspecified: Secondary | ICD-10-CM | POA: Diagnosis not present

## 2022-05-04 DIAGNOSIS — D696 Thrombocytopenia, unspecified: Secondary | ICD-10-CM

## 2022-05-04 LAB — CBC
HCT: 35.6 % — ABNORMAL LOW (ref 39.0–52.0)
Hemoglobin: 12.2 g/dL — ABNORMAL LOW (ref 13.0–17.0)
MCHC: 34.3 g/dL (ref 30.0–36.0)
MCV: 99.8 fl (ref 78.0–100.0)
Platelets: 158 10*3/uL (ref 150.0–400.0)
RBC: 3.56 Mil/uL — ABNORMAL LOW (ref 4.22–5.81)
RDW: 13 % (ref 11.5–15.5)
WBC: 8.5 10*3/uL (ref 4.0–10.5)

## 2022-05-04 LAB — IRON: Iron: 64 ug/dL (ref 42–165)

## 2022-05-04 LAB — FERRITIN: Ferritin: 21.9 ng/mL — ABNORMAL LOW (ref 22.0–322.0)

## 2022-05-11 ENCOUNTER — Telehealth: Payer: Medicare Other | Admitting: Nurse Practitioner

## 2022-05-11 DIAGNOSIS — D649 Anemia, unspecified: Secondary | ICD-10-CM

## 2022-05-11 NOTE — Progress Notes (Signed)
   Established Patient Office Visit  An audio-only tele-health visit was completed today for this patient. I connected with  Zachary Hall's wife [name]) on 05/11/22 on behalf of the patient utilizing audio-only technology and verified that I am speaking with the correct person using two identifiers. The patient was located at their home, and I was located at the office of [place] at [facility] during the encounter. I discussed the limitations of evaluation and management by telemedicine. The patient expressed understanding and agreed to proceed.     Subjective   Patient ID: Zachary Hall, male    DOB: 09-21-1934  Age: 87 y.o. MRN: [id number]  Chief Complaint  Patient presents with   Anemia    Anemia: last OV hgb was 12.2, ferritin low at 21.9 and iron normal at 65.  Per chart review he has had anemia intermittently documented as far back as 2011.  Last GFR 42.88.  Per wife patient had colonoscopy 1 year ago which was negative for colon cancer, however he was diagnosed with kidney cancer and has undergone treatment for that.  Wife reports diet is mainly of sugary foods, reports patient is on daily multivitamin.  Patient's wife also reports that he has been complaining of constipation    Review of Systems  Respiratory:  Negative for shortness of breath.   Cardiovascular:  Negative for chest pain.  Neurological:  Negative for dizziness and headaches.      Objective:     There were no vitals taken for this visit. BP Readings from Last 3 Encounters:  04/13/22 124/68  02/23/22 112/60  01/02/22 117/66   Wt Readings from Last 3 Encounters:  04/13/22 164 lb 2 oz (74.4 kg)  02/23/22 161 lb (73 kg)  01/27/22 164 lb (74.4 kg)      Physical Exam Comprehensive physical exam not completed today as office visit was conducted remotely.      No results found for any visits on 05/11/22.    The ASCVD Risk score (Arnett DK, et al., 2019) failed to calculate for the  following reasons:   The 2019 ASCVD risk score is only valid for ages 20 to 31    Assessment & Plan:   Problem List Items Addressed This Visit       Other   Anemia - Primary    Etiology unclear, possibly due to chronic kidney disease versus poor oral intake.  Patient has had longstanding anemia intermittently.  Had discussion with patient's wife and per shared decision making patient will follow-up next month for repeat labs. Will not start on iron supplement due to current constipation concerns, educated wife on use of OTC stool softener/miralax/prunes/fiberous foods to assist with constipation as well.  Wife educated to call office if patient complains of chest pain, dizziness, shortness of breath, or if blood is seen in stool or urine.  Wife reports understanding.       No follow-ups on file.    Ailene Ards, NP

## 2022-05-11 NOTE — Assessment & Plan Note (Signed)
Etiology unclear, possibly due to chronic kidney disease versus poor oral intake.  Patient has had longstanding anemia intermittently.  Had discussion with patient's wife and per shared decision making patient will follow-up next month for repeat labs. Will not start on iron supplement due to current constipation concerns, educated wife on use of OTC stool softener/miralax/prunes/fiberous foods to assist with constipation as well.  Wife educated to call office if patient complains of chest pain, dizziness, shortness of breath, or if blood is seen in stool or urine.  Wife reports understanding.

## 2022-06-01 ENCOUNTER — Ambulatory Visit (INDEPENDENT_AMBULATORY_CARE_PROVIDER_SITE_OTHER): Payer: Medicare Other | Admitting: Nurse Practitioner

## 2022-06-01 ENCOUNTER — Ambulatory Visit: Payer: Medicare Other | Admitting: Nurse Practitioner

## 2022-06-01 VITALS — BP 122/66 | HR 51 | Temp 97.6°F | Ht 70.0 in | Wt 169.0 lb

## 2022-06-01 DIAGNOSIS — D509 Iron deficiency anemia, unspecified: Secondary | ICD-10-CM

## 2022-06-01 DIAGNOSIS — R001 Bradycardia, unspecified: Secondary | ICD-10-CM

## 2022-06-01 DIAGNOSIS — K5900 Constipation, unspecified: Secondary | ICD-10-CM | POA: Diagnosis not present

## 2022-06-01 DIAGNOSIS — N183 Chronic kidney disease, stage 3 unspecified: Secondary | ICD-10-CM

## 2022-06-01 DIAGNOSIS — R634 Abnormal weight loss: Secondary | ICD-10-CM

## 2022-06-01 LAB — IRON: Iron: 119 ug/dL (ref 42–165)

## 2022-06-01 LAB — CBC
HCT: 34.8 % — ABNORMAL LOW (ref 39.0–52.0)
Hemoglobin: 11.9 g/dL — ABNORMAL LOW (ref 13.0–17.0)
MCHC: 34.1 g/dL (ref 30.0–36.0)
MCV: 99.8 fl (ref 78.0–100.0)
Platelets: 145 10*3/uL — ABNORMAL LOW (ref 150.0–400.0)
RBC: 3.49 Mil/uL — ABNORMAL LOW (ref 4.22–5.81)
RDW: 12.7 % (ref 11.5–15.5)
WBC: 7.9 10*3/uL (ref 4.0–10.5)

## 2022-06-01 LAB — IBC + FERRITIN
Ferritin: 24.9 ng/mL (ref 22.0–322.0)
Iron: 119 ug/dL (ref 42–165)
Saturation Ratios: 38.1 % (ref 20.0–50.0)
TIBC: 312.2 ug/dL (ref 250.0–450.0)
Transferrin: 223 mg/dL (ref 212.0–360.0)

## 2022-06-01 LAB — RETICULOCYTES
ABS Retic: 24220 cells/uL — ABNORMAL LOW (ref 25000–90000)
Retic Ct Pct: 0.7 %

## 2022-06-01 LAB — HEMOCCULT GUIAC POC 1CARD (OFFICE): Fecal Occult Blood, POC: NEGATIVE

## 2022-06-01 NOTE — Assessment & Plan Note (Addendum)
Chronic, recommend patient trial senna S in place of his stool softener.  He was also encouraged to focus on hydrating with at least 60 to 80 ounces of water a day, and consider adding prune juice to regimen to help with constipation.  Patient reports understanding.

## 2022-06-01 NOTE — Progress Notes (Addendum)
Established Patient Office Visit  Subjective   Patient ID: ARRIS MEYN, male    DOB: 1935-05-29  Age: 87 y.o. MRN: [id number]  Chief Complaint  Patient presents with   Anemia   Anemia/Constipation: Has had history of anemia intermittently for the last few years.  Wife also reports constipation admittedly.  He does follow with GI, wife reports last colonoscopy was approximately 2 years ago no cancer was noted.  Does not drink much water daily, not tight getting iron supplement due to concern of worsening constipation as of right now.  Take stool softener daily.  Last labs show hemoglobin of 12.2, normal iron but low ferritin at 21.9.  No visible blood in stool reported.  Patient does have CKD 3A. Patient is on ARB.  Bradycardia: Chronic, asymptomatic. Has been noted by his cardiologist that he follows up with routinely. Has known history of RBB and CAD requiring CABG.       Review of Systems  Constitutional:  Negative for fever.  Cardiovascular:  Negative for chest pain.  Gastrointestinal:  Negative for abdominal pain and blood in stool.      Objective:     BP 122/66   Pulse (!) 51   Temp 97.6 F (36.4 C) (Oral)   Ht '5\' 10"'$  (1.778 m)   Wt 169 lb (76.7 kg)   SpO2 96%   BMI 24.25 kg/m  BP Readings from Last 3 Encounters:  06/01/22 122/66  04/13/22 124/68  02/23/22 112/60   Wt Readings from Last 3 Encounters:  06/01/22 169 lb (76.7 kg)  04/13/22 164 lb 2 oz (74.4 kg)  02/23/22 161 lb (73 kg)      Physical Exam Vitals reviewed.  Constitutional:      Appearance: Normal appearance.  HENT:     Head: Normocephalic and atraumatic.  Cardiovascular:     Rate and Rhythm: Normal rate and regular rhythm.  Pulmonary:     Effort: Pulmonary effort is normal.     Breath sounds: Normal breath sounds.  Abdominal:     General: Bowel sounds are decreased.     Palpations: Abdomen is soft. There is no mass.  Genitourinary:    Rectum: Normal. Guaiac result negative.   Musculoskeletal:     Cervical back: Neck supple.  Skin:    General: Skin is warm and dry.  Neurological:     Mental Status: He is alert and oriented to person, place, and time.  Psychiatric:        Mood and Affect: Mood normal.        Behavior: Behavior normal.        Thought Content: Thought content normal.        Judgment: Judgment normal.      Results for orders placed or performed in visit on 06/01/22  Reticulocytes  Result Value Ref Range   Retic Ct Pct 0.7 %   ABS Retic 24,220 (L) 25,000 - 90,000 cells/uL  IBC + Ferritin  Result Value Ref Range   Iron 119 42 - 165 ug/dL   Transferrin 223.0 212.0 - 360.0 mg/dL   Saturation Ratios 38.1 20.0 - 50.0 %   Ferritin 24.9 22.0 - 322.0 ng/mL   TIBC 312.2 250.0 - 450.0 mcg/dL  Iron  Result Value Ref Range   Iron 119 42 - 165 ug/dL  CBC  Result Value Ref Range   WBC 7.9 4.0 - 10.5 K/uL   RBC 3.49 (L) 4.22 - 5.81 Mil/uL   Platelets 145.0 (L) 150.0 -  400.0 K/uL   Hemoglobin 11.9 (L) 13.0 - 17.0 g/dL   HCT 34.8 (L) 39.0 - 52.0 %   MCV 99.8 78.0 - 100.0 fl   MCHC 34.1 30.0 - 36.0 g/dL   RDW 12.7 11.5 - 15.5 %  POCT Occult Blood Stool  Result Value Ref Range   Fecal Occult Blood, POC Negative Negative   Card #1 Date     Card #2 Fecal Occult Blod, POC     Card #2 Date     Card #3 Fecal Occult Blood, POC     Card #3 Date        The ASCVD Risk score (Arnett DK, et al., 2019) failed to calculate for the following reasons:   The 2019 ASCVD risk score is only valid for ages 48 to 31    Assessment & Plan:   Problem List Items Addressed This Visit       Genitourinary   CKD (chronic kidney disease) stage 3, GFR 30-59 ml/min (HCC)    Chronic, patient will focus on hydrating well with water.  Consider rechecking bmp at next office visit.  May consider referral to nephrology.        Other   Bradycardia    Chronic, continues to be asymptomatic.  Patient to follow-up with cardiology as scheduled.      Unintentional  weight loss   Anemia - Primary    Chronic, Hemoccult negative.  We will recheck labs today including CBC, iron, ferritin, and have pathologist smear evaluated.  Further recommendations may be based upon the results.      Relevant Orders   CBC (Completed)   Iron (Completed)   IBC + Ferritin (Completed)   Reticulocytes (Completed)   Pathologist smear review   POCT Occult Blood Stool (Completed)   Constipation    Chronic, recommend patient trial senna S in place of his stool softener.  He was also encouraged to focus on hydrating with at least 60 to 80 ounces of water a day, and consider adding prune juice to regimen to help with constipation.  Patient reports understanding.       Return in about 3 months (around 09/01/2022).    Ailene Ards, NP

## 2022-06-01 NOTE — Assessment & Plan Note (Signed)
Chronic, continues to be asymptomatic.  Patient to follow-up with cardiology as scheduled.

## 2022-06-01 NOTE — Assessment & Plan Note (Signed)
Chronic, patient will focus on hydrating well with water.  Consider rechecking bmp at next office visit.  May consider referral to nephrology.

## 2022-06-01 NOTE — Assessment & Plan Note (Signed)
Chronic, Hemoccult negative.  We will recheck labs today including CBC, iron, ferritin, and have pathologist smear evaluated.  Further recommendations may be based upon the results.

## 2022-06-02 ENCOUNTER — Telehealth: Payer: Self-pay | Admitting: Nurse Practitioner

## 2022-06-02 LAB — PATHOLOGIST SMEAR REVIEW

## 2022-06-02 NOTE — Telephone Encounter (Signed)
Please call patient and his wife and let them know that I reviewed his chart and his last colonoscopy was 08/2019 and polyp was found and removed. After pathology report of polyp was completed they recommended he consider follow-up colonoscopy in 2 years which would mean he is due to have colonoscopy completed in 07/2022.   Additionally, his blood work from yesterday shows that he is still anemic (however it is just slightly anemic) and his body is underproducing new red blood cells which may be an indication that his chronic kidney disease is playing a role in his anemia.   Overall, I recommend that he follow-up with GI to determine if he still needs to undergo colonoscopy in a couple of months and I would recommend referral to nephrology (as of right now I believe he sees urology but not nephrology, please let me know if I am mistaken) to manage his chronic kidney disease. If they are agreeable to referral to nephrology please let me know.

## 2022-06-02 NOTE — Assessment & Plan Note (Signed)
Weight has stabilized and improved, no further workup recommended at this time.

## 2022-06-07 NOTE — Telephone Encounter (Signed)
Called pt and left voicemail for pt to call back.

## 2022-06-08 ENCOUNTER — Ambulatory Visit: Payer: Medicare Other | Admitting: Physician Assistant

## 2022-06-08 ENCOUNTER — Encounter: Payer: Self-pay | Admitting: Physician Assistant

## 2022-06-08 VITALS — BP 138/71 | HR 52 | Resp 18 | Wt 165.0 lb

## 2022-06-08 DIAGNOSIS — R251 Tremor, unspecified: Secondary | ICD-10-CM

## 2022-06-08 DIAGNOSIS — F0280 Dementia in other diseases classified elsewhere without behavioral disturbance: Secondary | ICD-10-CM | POA: Diagnosis not present

## 2022-06-08 DIAGNOSIS — G309 Alzheimer's disease, unspecified: Secondary | ICD-10-CM

## 2022-06-08 NOTE — Telephone Encounter (Signed)
Called pt and left voice mail for them to call back
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
## 2022-06-08 NOTE — Patient Instructions (Signed)
It was a pleasure to see you today at our office.   Recommendations:  Follow up in 3 months Continue Memantine 10 mg twice daily.Side effects were discussed  Discussed safety both in and out of the home.  Continue PT and a walker    Whom to call:  Memory  decline, memory medications: Call our office [phone]   For psychiatric meds, mood meds: Please have your primary care physician manage these medications.       For assessment of decision of mental capacity and competency:  Call Dr. Anthoney Harada, geriatric psychiatrist at [phone]  For guidance in geriatric dementia issues please call [facility] [phone]  For guidance regarding [facility] and if placement were needed at the facility, contact Arnell Asal, Social Worker tel: [phone]  If you have any severe symptoms of a stroke, or other severe issues such as confusion,severe chills or fever, etc call 911 or go to the ER as you may need to be evaluate further   Feel free to visit Facebook page " [email or web address]" for tips of how to care for people with memory problems.         RECOMMENDATIONS FOR ALL PATIENTS WITH MEMORY PROBLEMS: 1. Continue to exercise (Recommend 30 minutes of walking everyday, or 3 hours every week) 2. Increase social interactions - continue going to [place] and enjoy social gatherings with friends and family 3. Eat healthy, avoid fried foods and eat more fruits and vegetables 4. Maintain adequate blood pressure, blood sugar, and blood cholesterol level. Reducing the risk of stroke and cardiovascular disease also helps promoting better memory. 5. Avoid stressful situations. Live a simple life and avoid aggravations. Organize your time and prepare for the next day in anticipation. 6. Sleep well, avoid any interruptions of sleep and avoid any distractions in the bedroom that may interfere with adequate sleep quality 7. Avoid sugar, avoid sweets as there is a strong  link between excessive sugar intake, diabetes, and cognitive impairment We discussed the Mediterranean diet, which has been shown to help patients reduce the risk of progressive memory disorders and reduces cardiovascular risk. This includes eating fish, eat fruits and green leafy vegetables, nuts like almonds and hazelnuts, walnuts, and also use olive oil. Avoid fast foods and fried foods as much as possible. Avoid sweets and sugar as sugar use has been linked to worsening of memory function.  There is always a concern of gradual progression of memory problems. If this is the case, then we may need to adjust level of care according to patient needs. Support, both to the patient and caregiver, should then be put into place.    The [employer] Association is here all day, every day for people facing Alzheimer's disease through our free 24/7 Helpline: [phone]. The Helpline provides reliable information and support to all those who need assistance, such as individuals living with memory loss, Alzheimer's or other dementia, caregivers, health care professionals and the public.  Our highly trained and knowledgeable staff can help you with: Understanding memory loss, dementia and Alzheimer's  Medications and other treatment options  General information about aging and brain health  Skills to provide quality care and to find the best care from professionals  Legal, financial and living-arrangement decisions Our Helpline also features: Confidential care consultation provided by master's level clinicians who can help with decision-making support, crisis assistance and education on issues families face every day  Help in a caller's preferred language using our translation service that features  more than 200 languages and dialects  Referrals to local community programs, services and ongoing support     FALL PRECAUTIONS: Be cautious when walking. Scan the area for obstacles that may increase the risk  of trips and falls. When getting up in the mornings, sit up at the edge of the bed for a few minutes before getting out of bed. Consider elevating the bed at the head end to avoid drop of blood pressure when getting up. Walk always in a well-lit room (use night lights in the walls). Avoid area rugs or power cords from appliances in the middle of the walkways. Use a walker or a cane if necessary and consider physical therapy for balance exercise. Get your eyesight checked regularly.  FINANCIAL OVERSIGHT: Supervision, especially oversight when making financial decisions or transactions is also recommended.  HOME SAFETY: Consider the safety of the kitchen when operating appliances like stoves, microwave oven, and blender. Consider having supervision and share cooking responsibilities until no longer able to participate in those. Accidents with firearms and other hazards in the house should be identified and addressed as well.   ABILITY TO BE LEFT ALONE: If patient is unable to contact 911 operator, consider using LifeLine, or when the need is there, arrange for someone to stay with patients. Smoking is a fire hazard, consider supervision or cessation. Risk of wandering should be assessed by caregiver and if detected at any point, supervision and safe proof recommendations should be instituted.  MEDICATION SUPERVISION: Inability to self-administer medication needs to be constantly addressed. Implement a mechanism to ensure safe administration of the medications.   DRIVING: Regarding driving, in patients with progressive memory problems, driving will be impaired. We advise to have someone else do the driving if trouble finding directions or if minor accidents are reported. Independent driving assessment is available to determine safety of driving.   If you are interested in the driving assessment, you can contact the following:  [employer] in [place]  [place] [phone] or [phone]      [place] refers to food and lifestyle choices that are based on the traditions of countries located on the [employer]. This way of eating has been shown to help prevent certain conditions and improve outcomes for people who have chronic diseases, like kidney disease and heart disease. What are tips for following this plan? Lifestyle  Cook and eat meals together with your family, when possible. Drink enough fluid to keep your urine clear or pale yellow. Be physically active every day. This includes: Aerobic exercise like running or swimming. Leisure activities like gardening, walking, or housework. Get 7-8 hours of sleep each night. If recommended by your health care provider, drink red wine in moderation. This means 1 glass a day for nonpregnant women and 2 glasses a day for men. A glass of wine equals 5 oz (150 mL). Reading food labels  Check the serving size of packaged foods. For foods such as rice and pasta, the serving size refers to the amount of cooked product, not dry. Check the total fat in packaged foods. Avoid foods that have saturated fat or trans fats. Check the ingredients list for added sugars, such as corn syrup. Shopping  At the grocery store, buy most of your food from the areas near the walls of the store. This includes: Fresh fruits and vegetables (produce). Grains, beans, nuts, and seeds. Some of  these may be available in unpackaged forms or large amounts (in bulk). Fresh seafood. Poultry and eggs. Low-fat dairy products. Buy whole ingredients instead of prepackaged foods. Buy fresh fruits and vegetables in-season from local farmers markets. Buy frozen fruits and vegetables in resealable bags. If you do not have access to quality fresh seafood, buy precooked frozen shrimp or canned fish, such as tuna,  salmon, or sardines. Buy small amounts of raw or cooked vegetables, salads, or olives from the deli or salad bar at your store. Stock your pantry so you always have certain foods on hand, such as olive oil, canned tuna, canned tomatoes, rice, pasta, and beans. Cooking  Cook foods with extra-virgin olive oil instead of using butter or other vegetable oils. Have meat as a side dish, and have vegetables or grains as your main dish. This means having meat in small portions or adding small amounts of meat to foods like pasta or stew. Use beans or vegetables instead of meat in common dishes like chili or lasagna. Experiment with different cooking methods. Try roasting or broiling vegetables instead of steaming or sauteing them. Add frozen vegetables to soups, stews, pasta, or rice. Add nuts or seeds for added healthy fat at each meal. You can add these to yogurt, salads, or vegetable dishes. Marinate fish or vegetables using olive oil, lemon juice, garlic, and fresh herbs. Meal planning  Plan to eat 1 vegetarian meal one day each week. Try to work up to 2 vegetarian meals, if possible. Eat seafood 2 or more times a week. Have healthy snacks readily available, such as: Vegetable sticks with hummus. Greek yogurt. Fruit and nut trail mix. Eat balanced meals throughout the week. This includes: Fruit: 2-3 servings a day Vegetables: 4-5 servings a day Low-fat dairy: 2 servings a day Fish, poultry, or lean meat: 1 serving a day Beans and legumes: 2 or more servings a week Nuts and seeds: 1-2 servings a day Whole grains: 6-8 servings a day Extra-virgin olive oil: 3-4 servings a day Limit red meat and sweets to only a few servings a month What are my food choices? Mediterranean diet Recommended Grains: Whole-grain pasta. Brown rice. Bulgar wheat. Polenta. Couscous. Whole-wheat bread. [place]. Vegetables: Artichokes. Beets. Broccoli. Cabbage. Carrots. Eggplant. Green beans. Chard. Kale.  Spinach. Onions. Leeks. Peas. Squash. Tomatoes. Peppers. Radishes. Fruits: Apples. Apricots. Avocado. Berries. Bananas. Cherries. Dates. Figs. Grapes. Lemons. Melon. Oranges. Peaches. Plums. Pomegranate. Meats and other protein foods: Beans. Almonds. Sunflower seeds. Pine nuts. Peanuts. [place]. Salmon. Scallops. Shrimp. [place]. Tilapia. Clams. Oysters. Eggs. Dairy: Low-fat milk. Cheese. Greek yogurt. Beverages: Water. Red wine. Herbal tea. Fats and oils: Extra virgin olive oil. Avocado oil. Grape seed oil. Sweets and desserts: [place] yogurt with honey. Baked apples. Poached pears. Trail mix. Seasoning and other foods: Basil. Cilantro. Coriander. Cumin. Mint. Parsley. Sage. Rosemary. Tarragon. Garlic. Oregano. Thyme. Pepper. Balsalmic vinegar. Tahini. Hummus. Tomato sauce. Olives. Mushrooms. Limit these Grains: Prepackaged pasta or rice dishes. Prepackaged cereal with added sugar. Vegetables: Deep fried potatoes (french fries). Fruits: Fruit canned in syrup. Meats and other protein foods: Beef. Pork. Lamb. Poultry with skin. Hot dogs. Berniece Salines. Dairy: Ice cream. Sour cream. Whole milk. Beverages: Juice. Sugar-sweetened soft drinks. Beer. Liquor and spirits. Fats and oils: Butter. Canola oil. Vegetable oil. Beef fat (tallow). Lard. Sweets and desserts: Cookies. Cakes. Pies. Candy. Seasoning and other foods: Mayonnaise. Premade sauces and marinades. The items listed may not be a complete list. Talk with your dietitian about what dietary choices are right for  you. Summary The Mediterranean diet includes both food and lifestyle choices. Eat a variety of fresh fruits and vegetables, beans, nuts, seeds, and whole grains. Limit the amount of red meat and sweets that you eat. Talk with your health care provider about whether it is safe for you to drink red wine in moderation. This means 1 glass a day for nonpregnant women and 2 glasses a day for men. A glass of wine equals 5 oz (150 mL). This information is  not intended to replace advice given to you by your health care provider. Make sure you discuss any questions you have with your health care provider. Document Released: 03/09/2016 Document Revised: 04/11/2016 Document Reviewed: 03/09/2016 Elsevier Interactive Patient Education  2017 [employer].

## 2022-06-09 ENCOUNTER — Ambulatory Visit: Payer: Medicare Other | Admitting: *Deleted

## 2022-06-09 ENCOUNTER — Telehealth: Payer: Self-pay | Admitting: Nurse Practitioner

## 2022-06-09 NOTE — Telephone Encounter (Signed)
Spoke to wife today regarding recommendation to have evaluation with nephrologist regarding his unexplained anemia. She would like to first have him undergo colonoscopy in January and follow-up with his urologist to discuss necessity of referral to nephrology. She will notify me once she has seen the urologist if she would like to move forward with nephrology referral.

## 2022-06-09 NOTE — Telephone Encounter (Signed)
Spoke to pt wife and to relay message of provider notes. Pt wife was wanted more answers to some questions regarding notes, let provider be aware of it and provider spoke to pt wife about her concerns

## 2022-06-09 NOTE — Patient Instructions (Signed)
Health Maintenance, Male Adopting a healthy lifestyle and getting preventive care are important in promoting health and wellness. Ask your health care provider about: The right schedule for you to have regular tests and exams. Things you can do on your own to prevent diseases and keep yourself healthy. What should I know about diet, weight, and exercise? Eat a healthy diet  Eat a diet that includes plenty of vegetables, fruits, low-fat dairy products, and lean protein. Do not eat a lot of foods that are high in solid fats, added sugars, or sodium. Maintain a healthy weight Body mass index (BMI) is a measurement that can be used to identify possible weight problems. It estimates body fat based on height and weight. Your health care provider can help determine your BMI and help you achieve or maintain a healthy weight. Get regular exercise Get regular exercise. This is one of the most important things you can do for your health. Most adults should: Exercise for at least 150 minutes each week. The exercise should increase your heart rate and make you sweat (moderate-intensity exercise). Do strengthening exercises at least twice a week. This is in addition to the moderate-intensity exercise. Spend less time sitting. Even light physical activity can be beneficial. Watch cholesterol and blood lipids Have your blood tested for lipids and cholesterol at 85 years of age, then have this test every 5 years. You may need to have your cholesterol levels checked more often if: Your lipid or cholesterol levels are high. You are older than 85 years of age. You are at high risk for heart disease. What should I know about cancer screening? Many types of cancers can be detected early and may often be prevented. Depending on your health history and family history, you may need to have cancer screening at various ages. This may include screening for: Colorectal cancer. Prostate cancer. Skin cancer. Lung  cancer. What should I know about heart disease, diabetes, and high blood pressure? Blood pressure and heart disease High blood pressure causes heart disease and increases the risk of stroke. This is more likely to develop in people who have high blood pressure readings or are overweight. Talk with your health care provider about your target blood pressure readings. Have your blood pressure checked: Every 3-5 years if you are 18-39 years of age. Every year if you are 40 years old or older. If you are between the ages of 65 and 75 and are a current or former smoker, ask your health care provider if you should have a one-time screening for abdominal aortic aneurysm (AAA). Diabetes Have regular diabetes screenings. This checks your fasting blood sugar level. Have the screening done: Once every three years after age 45 if you are at a normal weight and have a low risk for diabetes. More often and at a younger age if you are overweight or have a high risk for diabetes. What should I know about preventing infection? Hepatitis B If you have a higher risk for hepatitis B, you should be screened for this virus. Talk with your health care provider to find out if you are at risk for hepatitis B infection. Hepatitis C Blood testing is recommended for: Everyone born from 1945 through 1965. Anyone with known risk factors for hepatitis C. Sexually transmitted infections (STIs) You should be screened each year for STIs, including gonorrhea and chlamydia, if: You are sexually active and are younger than 85 years of age. You are older than 85 years of age and your   health care provider tells you that you are at risk for this type of infection. Your sexual activity has changed since you were last screened, and you are at increased risk for chlamydia or gonorrhea. Ask your health care provider if you are at risk. Ask your health care provider about whether you are at high risk for HIV. Your health care provider  may recommend a prescription medicine to help prevent HIV infection. If you choose to take medicine to prevent HIV, you should first get tested for HIV. You should then be tested every 3 months for as long as you are taking the medicine. Follow these instructions at home: Alcohol use Do not drink alcohol if your health care provider tells you not to drink. If you drink alcohol: Limit how much you have to 0-2 drinks a day. Know how much alcohol is in your drink. In the [place] one drink equals one 12 oz bottle of beer (355 mL), one 5 oz glass of Jermain Curt (148 mL), or one 1 oz glass of hard liquor (44 mL). Lifestyle Do not use any products that contain nicotine or tobacco. These products include cigarettes, chewing tobacco, and vaping devices, such as e-cigarettes. If you need help quitting, ask your health care provider. Do not use street drugs. Do not share needles. Ask your health care provider for help if you need support or information about quitting drugs. General instructions Schedule regular health, dental, and eye exams. Stay current with your vaccines. Tell your health care provider if: You often feel depressed. You have ever been abused or do not feel safe at home. Summary Adopting a healthy lifestyle and getting preventive care are important in promoting health and wellness. Follow your health care provider's instructions about healthy diet, exercising, and getting tested or screened for diseases. Follow your health care provider's instructions on monitoring your cholesterol and blood pressure. This information is not intended to replace advice given to you by your health care provider. Make sure you discuss any questions you have with your health care provider. Document Revised: 12/06/2020 Document Reviewed: 12/06/2020 Elsevier Patient Education  2023 [employer] Inc[employer]  

## 2022-06-09 NOTE — Progress Notes (Signed)
Erroneous encounter

## 2022-07-04 NOTE — Progress Notes (Signed)
Cardiology Office Note   Date:  07/06/2022   ID:  Zachary Hall, DOB 1934-10-23, MRN [id number]  PCP:  None Cardiologist: Oneita Allmon [place] MD  Chief Complaint  Patient presents with   Coronary Artery Disease       History of Present Illness: Zachary Hall is a 87 y.o. male is seen for follow up CAD.  He has a history of known ischemic heart disease. He underwent coronary artery bypass graft surgery on 02/07/10 by Dr. Laneta Simmers. Last Myoview in February 2015 was normal with EF 66%.   His wife is concerned that he has more cognitive decline. Forgets things. Loses his hearing aids.  MRI showed chronic small vessel ischemic changes but no acute findings. Is followed by Neuro.   He did undergo left partial nephrectomy for a renal mass on February 25, 2021. 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  According to his wife his memory is worse and his energy is not good. He is eating well. Some CKD noted on last lab work. Denies any chest pain or dyspnea. No edema or palpitations. No dizziness or syncope.    Past Medical History:  Diagnosis Date   Arthritis    Balance problem    Barrett esophagus    Bradycardia    Coronary artery disease    GERD (gastroesophageal reflux disease)    Gout    History of atrial fibrillation    Previously on amiodarone   Hx of CABG    Hypercholesterolemia    Hypertension    Left renal mass    Melanoma (HCC)    Left shoulder blade   Nocturia    Right epiretinal membrane 02/10/2020    Past Surgical History:  Procedure Laterality Date   CARDIAC CATHETERIZATION  01/04/2010   COLONOSCOPY     CORONARY ARTERY BYPASS GRAFT  02/07/2010   LIMA to LAD, SVG to 2nd DX, SVG to OM   EYE SURGERY Bilateral    Cataract Extraction   HEMORRHOID SURGERY     x 2   HEMORRHOID SURGERY N/A 11/13/2012   Procedure: PPH Hemorrhoidectomy;  Surgeon: Robyne Askew, MD;  Location: [facility];  Service: General;  Laterality: N/A;   INGUINAL HERNIA REPAIR Right  01/22/2017   Procedure: RIGHT INGUINAL HERNIA REPAIR WITH MESH;  Surgeon: Darnell Level, MD;  Location: [facility] OR;  Service: General;  Laterality: Right;   INSERTION OF MESH Right 01/22/2017   Procedure: INSERTION OF MESH;  Surgeon: Darnell Level, MD;  Location: [facility] OR;  Service: General;  Laterality: Right;   IR RADIOLOGIST EVAL & MGMT  01/06/2021   NASAL POLYP EXCISION     OTHER SURGICAL HISTORY  06/05/2002   Penile Implant   ROBOTIC ASSITED PARTIAL NEPHRECTOMY Left 02/25/2021   Procedure: XI ROBOTIC ASSITED PARTIAL NEPHRECTOMY;  Surgeon: Rene Paci, MD;  Location: [facility] ORS;  Service: Urology;  Laterality: Left;   UPPER GI ENDOSCOPY       Current Outpatient Medications  Medication Sig Dispense Refill   amLODipine (NORVASC) 10 MG tablet TAKE 1 TABLET BY MOUTH EVERY DAY 90 tablet 1   ammonium lactate (AMLACTIN) 12 % cream Apply 1 g topically as needed for dry skin.     atorvastatin (LIPITOR) 10 MG tablet TAKE 1 TABLET (10 MG TOTAL) BY MOUTH DAILY AT 6 PM. 90 tablet 3   cetirizine (ZYRTEC) 10 MG tablet Take 10 mg by mouth daily.     docusate sodium (COLACE) 100 MG capsule Take 1  capsule (100 mg total) by mouth 2 (two) times daily.     losartan (COZAAR) 50 MG tablet TAKE 1 TABLET BY MOUTH EVERY DAY 90 tablet 3   Magnesium Hydroxide (MAGNESIA PO) Take by mouth.     memantine (NAMENDA) 10 MG tablet 1 tablet twice a day 180 tablet 3   mupirocin ointment (BACTROBAN) 2 % Apply 1 application. topically 2 (two) times daily.     nitroGLYCERIN (NITROSTAT) 0.4 MG SL tablet *PLACE 1 TABLET UNDER TONGUE EVERY 5 MINS., UP TO 3 DOSES AS NEEDED FOR CHEST PAIN* 25 tablet 2   omeprazole (PRILOSEC) 40 MG capsule Take 40 mg by mouth daily before breakfast.     promethazine (PHENERGAN) 12.5 MG tablet Take 1 tablet (12.5 mg total) by mouth every 4 (four) hours as needed for nausea or vomiting. 15 tablet 0   traMADol (ULTRAM) 50 MG tablet Take 1-2 tablets (50-100 mg total) by mouth every 6 (six) hours as  needed for moderate pain or severe pain. 20 tablet 0   No current facility-administered medications for this visit.    Allergies:   Lisinopril    Social History:  The patient  reports that he quit smoking about 61 years ago. His smoking use included cigarettes. He has quit using smokeless tobacco. He reports current alcohol use of about 4.0 standard drinks of alcohol per week. He reports that he does not use drugs.   Family History:  The patient's family history includes Alcohol abuse in his father; Cancer in his mother; Colon cancer in his mother.    ROS:  Please see the history of present illness.   Otherwise, review of systems are positive for none.   All other systems are reviewed and negative.    PHYSICAL EXAM: VS:  BP 110/70   Pulse (!) 52   Ht 5\' 10"  (1.778 m)   Wt 168 lb (76.2 kg)   SpO2 97%   BMI 24.11 kg/m  , BMI Body mass index is 24.11 kg/m. GENERAL:  Well appearing elderly WM in NAD HEENT:  PERRL, EOMI, sclera are clear. Oropharynx is clear. NECK:  No jugular venous distention, carotid upstroke brisk and symmetric, no bruits, no thyromegaly or adenopathy LUNGS:  Clear to auscultation bilaterally CHEST:  Unremarkable HEART:  RRR,  PMI not displaced or sustained,S1 and S2 within normal limits, no S3, no S4: no clicks, no rubs, no murmurs ABD:  Soft, nontender. BS +, no masses or bruits. No hepatomegaly, no splenomegaly EXT:  2 + pulses throughout, no edema, no cyanosis no clubbing SKIN:  Warm and dry.  No rashes NEURO:  Alert and oriented x 3. Cranial nerves II through XII intact. PSYCH:  Cognitively intact  EKG:  EKG  Is not  ordered today.   Recent Labs: 03/06/2022: ALT 14; BUN 31; Creatinine, Ser 1.48; Potassium 3.9; Sodium 144; TSH 2.78 06/01/2022: Hemoglobin 11.9; Platelets 145.0    Lipid Panel    Component Value Date/Time   CHOL 112 11/24/2021 1045   TRIG 122 11/24/2021 1045   HDL 37 (L) 11/24/2021 1045   CHOLHDL 3.0 11/24/2021 1045   CHOLHDL 2.7  10/16/2016 1443   VLDL 32 (H) 10/16/2016 1443   LDLCALC 53 11/24/2021 1045    Dated 11/01/20: CMET normal.  Wt Readings from Last 3 Encounters:  07/06/22 168 lb (76.2 kg)  06/08/22 165 lb (74.8 kg)  06/01/22 169 lb (76.7 kg)     ASSESSMENT AND PLAN:  1.  Ischemic heart disease status post CABG  2011. Patient is asymptomatic. Last Myoview in Feb. 2015 was normal. Continue current medical therapy.  2.  RBBB with sinus bradycardia. Chronic- asymptomatic. Will monitor. On no rate slowing therapy. No indication for pacemaker.  3. Hypercholesterolemia. Excellent control on statin. LDL 53. Given advanced age and overall medical/neurologic decline I recommend reducing lipitor to 5 mg daily.  4. Remote history of paroxysmal atrial fibrillation- no recurrence. 5. HTN controlled. 6.  Memory disorder followed by Neurology. Encourage follow up with Neuro. 7. Renal cell carcinoma. S/p partial nephrectomy. Encourage increased calorie intake with good protein source. 8. CKD stage 3a. Will DC HCTZ given CKD and the fact that BP control is excellent. Continue losartan and amlodipine.  Current medicines are reviewed at length with the patient today.  The patient does not have concerns regarding medicines.  The following changes have been made:  see above.  Labs/ tests ordered today include:   No orders of the defined types were placed in this encounter.    Disposition: Continue current medication.  Follow up 6 months   Signed, Granvil Djordjevic [place] MD, [facility]   07/06/2022 2:49 PM    [facility]

## 2022-07-06 ENCOUNTER — Ambulatory Visit: Payer: Medicare Other | Attending: Cardiology | Admitting: Cardiology

## 2022-07-06 ENCOUNTER — Encounter: Payer: Self-pay | Admitting: Cardiology

## 2022-07-06 VITALS — BP 110/70 | HR 52 | Ht 70.0 in | Wt 168.0 lb

## 2022-07-06 DIAGNOSIS — E7800 Pure hypercholesterolemia, unspecified: Secondary | ICD-10-CM | POA: Diagnosis not present

## 2022-07-06 DIAGNOSIS — C642 Malignant neoplasm of left kidney, except renal pelvis: Secondary | ICD-10-CM | POA: Diagnosis not present

## 2022-07-06 DIAGNOSIS — I10 Essential (primary) hypertension: Secondary | ICD-10-CM

## 2022-07-06 DIAGNOSIS — I25708 Atherosclerosis of coronary artery bypass graft(s), unspecified, with other forms of angina pectoris: Secondary | ICD-10-CM

## 2022-07-06 DIAGNOSIS — I4510 Unspecified right bundle-branch block: Secondary | ICD-10-CM | POA: Diagnosis not present

## 2022-07-06 MED ORDER — ATORVASTATIN CALCIUM 10 MG PO TABS: 5.0000 mg | ORAL_TABLET | Freq: Every day | ORAL | 3 refills | Status: DC

## 2022-07-06 NOTE — Patient Instructions (Signed)
Stop taking HCTZ  Reduce lipitor to 5 mg daily

## 2022-07-12 ENCOUNTER — Other Ambulatory Visit (HOSPITAL_COMMUNITY): Payer: Self-pay | Admitting: Urology

## 2022-07-12 ENCOUNTER — Ambulatory Visit (HOSPITAL_COMMUNITY)
Admission: RE | Admit: 2022-07-12 | Discharge: 2022-07-12 | Disposition: A | Discharge status: Home / Self Care | Payer: Medicare Other | Source: Ambulatory Visit | Attending: Urology | Admitting: Urology

## 2022-07-12 DIAGNOSIS — N281 Cyst of kidney, acquired: Secondary | ICD-10-CM | POA: Diagnosis not present

## 2022-07-12 DIAGNOSIS — Z85528 Personal history of other malignant neoplasm of kidney: Secondary | ICD-10-CM | POA: Diagnosis not present

## 2022-07-12 DIAGNOSIS — C642 Malignant neoplasm of left kidney, except renal pelvis: Secondary | ICD-10-CM

## 2022-07-12 DIAGNOSIS — I7102 Dissection of abdominal aorta: Secondary | ICD-10-CM | POA: Diagnosis not present

## 2022-07-12 DIAGNOSIS — K5730 Diverticulosis of large intestine without perforation or abscess without bleeding: Secondary | ICD-10-CM | POA: Diagnosis not present

## 2022-07-12 DIAGNOSIS — C649 Malignant neoplasm of unspecified kidney, except renal pelvis: Secondary | ICD-10-CM | POA: Diagnosis not present

## 2022-07-12 DIAGNOSIS — D49512 Neoplasm of unspecified behavior of left kidney: Secondary | ICD-10-CM | POA: Diagnosis not present

## 2022-07-20 DIAGNOSIS — C642 Malignant neoplasm of left kidney, except renal pelvis: Secondary | ICD-10-CM | POA: Diagnosis not present

## 2022-08-01 ENCOUNTER — Telehealth: Payer: Self-pay | Admitting: Neurology

## 2022-08-01 ENCOUNTER — Telehealth: Payer: Self-pay | Admitting: Nurse Practitioner

## 2022-08-01 ENCOUNTER — Other Ambulatory Visit (INDEPENDENT_AMBULATORY_CARE_PROVIDER_SITE_OTHER): Payer: Medicare Other

## 2022-08-01 DIAGNOSIS — N390 Urinary tract infection, site not specified: Secondary | ICD-10-CM | POA: Diagnosis not present

## 2022-08-01 LAB — URINALYSIS, ROUTINE W REFLEX MICROSCOPIC
Bilirubin Urine: NEGATIVE
Hgb urine dipstick: NEGATIVE
Leukocytes,Ua: NEGATIVE
Nitrite: NEGATIVE
RBC / HPF: NONE SEEN (ref 0–?)
Specific Gravity, Urine: >=1.03 — AB (ref 1.000–1.030)
Total Protein, Urine: 30 — AB
Urine Glucose: NEGATIVE
Urobilinogen, UA: 0.2 (ref 0.0–1.0)
pH: 6 (ref 5.0–8.0)

## 2022-08-01 NOTE — Telephone Encounter (Signed)
Call pt spoke w/ wife she states husband may have UTI. He having a hard time getting up from chair. Dr. Delice Lesch want him to have urine check. Also wanted to let Judson Roch know that he saw the cardiology 07/06/22 and he took him off the HCTZ. Not sure if the symptoms is related to that or not. Inform pt will order UA. Clarise Cruz is out of the office today but will forward the msg to her desktop.Marland KitchenJohny Chess

## 2022-08-01 NOTE — Telephone Encounter (Signed)
Pt's wife called in and left a message. She stated the dementia seems to be escalating. He is very paranoid about things and is having some hallucinations.

## 2022-08-01 NOTE — Telephone Encounter (Signed)
Patients wife called and said that patients Neurologist Dr Delice Lesch wanted patient to get a urinalysis done. Call back is [phone]

## 2022-08-01 NOTE — Telephone Encounter (Signed)
Pt wife called informed that with sudden change he needs to be checked for a UTI by his PCP. Pt wife stated that she will call to get an appointment ,

## 2022-08-01 NOTE — Telephone Encounter (Signed)
Is this a sudden change? With sudden changes in patients with dementia, need to make sure there is no infection driving it, UTI is most common. Pls have them contact PCP, will need urinalysis. Thanks

## 2022-08-02 ENCOUNTER — Other Ambulatory Visit: Payer: Medicare Other

## 2022-08-04 ENCOUNTER — Telehealth: Payer: Self-pay | Admitting: Anesthesiology

## 2022-08-04 NOTE — Telephone Encounter (Signed)
Pt's wife left message stating pt went to PCP and had a urinalysis which came back negative. States does he need to wait for his appointment on 09/12/2022 or does he need to be seen sooner. Call back number is [phone].

## 2022-08-04 NOTE — Telephone Encounter (Signed)
Zachary Hall has openings in the next 2-3 weeks, ok to move up appt, thanks

## 2022-08-10 DIAGNOSIS — Z85820 Personal history of malignant melanoma of skin: Secondary | ICD-10-CM | POA: Diagnosis not present

## 2022-08-10 DIAGNOSIS — D225 Melanocytic nevi of trunk: Secondary | ICD-10-CM | POA: Diagnosis not present

## 2022-08-10 DIAGNOSIS — Z85828 Personal history of other malignant neoplasm of skin: Secondary | ICD-10-CM | POA: Diagnosis not present

## 2022-08-10 DIAGNOSIS — D2271 Melanocytic nevi of right lower limb, including hip: Secondary | ICD-10-CM | POA: Diagnosis not present

## 2022-08-10 DIAGNOSIS — L821 Other seborrheic keratosis: Secondary | ICD-10-CM | POA: Diagnosis not present

## 2022-08-10 DIAGNOSIS — L570 Actinic keratosis: Secondary | ICD-10-CM | POA: Diagnosis not present

## 2022-08-22 ENCOUNTER — Ambulatory Visit: Payer: Medicare Other | Admitting: Physician Assistant

## 2022-08-22 ENCOUNTER — Encounter (INDEPENDENT_AMBULATORY_CARE_PROVIDER_SITE_OTHER): Payer: Medicare Other | Admitting: Ophthalmology

## 2022-08-31 ENCOUNTER — Ambulatory Visit (INDEPENDENT_AMBULATORY_CARE_PROVIDER_SITE_OTHER): Payer: Medicare Other | Admitting: Nurse Practitioner

## 2022-08-31 ENCOUNTER — Ambulatory Visit (INDEPENDENT_AMBULATORY_CARE_PROVIDER_SITE_OTHER): Payer: Medicare Other

## 2022-08-31 VITALS — BP 120/62 | HR 50 | Temp 97.8°F | Ht 70.0 in | Wt 167.0 lb

## 2022-08-31 DIAGNOSIS — M25662 Stiffness of left knee, not elsewhere classified: Secondary | ICD-10-CM | POA: Diagnosis not present

## 2022-08-31 DIAGNOSIS — R634 Abnormal weight loss: Secondary | ICD-10-CM | POA: Diagnosis not present

## 2022-08-31 DIAGNOSIS — I10 Essential (primary) hypertension: Secondary | ICD-10-CM

## 2022-08-31 DIAGNOSIS — N183 Chronic kidney disease, stage 3 unspecified: Secondary | ICD-10-CM | POA: Diagnosis not present

## 2022-08-31 DIAGNOSIS — F0390 Unspecified dementia without behavioral disturbance: Secondary | ICD-10-CM

## 2022-08-31 DIAGNOSIS — Z85528 Personal history of other malignant neoplasm of kidney: Secondary | ICD-10-CM | POA: Insufficient documentation

## 2022-08-31 DIAGNOSIS — M25661 Stiffness of right knee, not elsewhere classified: Secondary | ICD-10-CM | POA: Diagnosis not present

## 2022-08-31 DIAGNOSIS — M1712 Unilateral primary osteoarthritis, left knee: Secondary | ICD-10-CM | POA: Diagnosis not present

## 2022-08-31 DIAGNOSIS — M1711 Unilateral primary osteoarthritis, right knee: Secondary | ICD-10-CM | POA: Diagnosis not present

## 2022-08-31 LAB — BASIC METABOLIC PANEL
BUN: 30 mg/dL — ABNORMAL HIGH (ref 6–23)
CO2: 30 mEq/L (ref 19–32)
Calcium: 9.5 mg/dL (ref 8.4–10.5)
Chloride: 105 mEq/L (ref 96–112)
Creatinine, Ser: 1.25 mg/dL (ref 0.40–1.50)
GFR: 52.33 mL/min — ABNORMAL LOW (ref >60.00)
Glucose, Bld: 93 mg/dL (ref 70–99)
Potassium: 4.8 mEq/L (ref 3.5–5.1)
Sodium: 141 mEq/L (ref 135–145)

## 2022-08-31 NOTE — Progress Notes (Signed)
Established Patient Office Visit  Subjective   Patient ID: Zachary Hall, male    DOB: 09-30-1934  Age: 88 y.o. MRN: [id number]  Chief Complaint  Patient presents with   bilateral knee stiffness    Patient has today accompanied by his wife. Reports bilateral knee stiffness that is worse when first trying to get up.  Has noticed this over the last 6 months.  Once he gets up moves around stiffness is relieved and he is able to walk with his assistive device.  Denies recent falls.  Recently saw cardiologist who stopped his hydrochlorothiazide and reduce his Lipitor, patient has tolerated medication changes well.  Continues to follow-up with neurology for treatment of his dementia due to Alzheimer's.  Continues on Namenda.  Tolerating well.  Wife reports cognitive function has declined noticeably and seems to wax and wane in intensity.  Patient still able to participate in most of his ADLs does require some assistance from his wife for prompting from his wife at times.  Continue to see urologist regarding history of renal cancer, per wife report everything appears stable from urology perspective.  I did recommend patient see nephrology due to chronic kidney disease stage III as evidenced by last GFR 42.8.  They discussed this with urologist and he recommended they continue to follow-up with him unless kidney function continues to decline.  Has also had unintentional weight loss recently, weight today appears stable compared to last office visit.    Review of Systems  Respiratory:  Negative for shortness of breath.   Cardiovascular:  Negative for chest pain.  Musculoskeletal:  Negative for falls.      Objective:     BP 120/62   Pulse (!) 50   Temp 97.8 F (36.6 C) (Oral)   Ht '5\' 10"'$  (1.778 m)   Wt 167 lb (75.8 kg)   SpO2 96%   BMI 23.96 kg/m  BP Readings from Last 3 Encounters:  08/31/22 120/62  07/06/22 110/70  06/08/22 138/71   Wt Readings from Last 3 Encounters:  08/31/22  167 lb (75.8 kg)  07/06/22 168 lb (76.2 kg)  06/08/22 165 lb (74.8 kg)      06/08/2022    6:00 PM 09/07/2021   11:00 AM 10/23/2017    1:00 PM  MMSE - Mini Mental State Exam  Orientation to time '3 3 4  '$ Orientation to Place '5 5 5  '$ Registration '1 3 3  '$ Attention/ Calculation 0 4 4  Recall 0 0 0  Language- name 2 objects '2 2 2  '$ Language- repeat '1 1 1  '$ Language- follow 3 step command '3 2 3  '$ Language- read & follow direction '1 1 1  '$ Write a sentence 0 0 1  Copy design 0 0 1  Total score '16 21 25      '$ Physical Exam Vitals reviewed.  Constitutional:      Appearance: Normal appearance.  HENT:     Head: Normocephalic and atraumatic.  Cardiovascular:     Rate and Rhythm: Normal rate and regular rhythm.     Pulses:          Dorsalis pedis pulses are 2+ on the right side and 2+ on the left side.  Pulmonary:     Effort: Pulmonary effort is normal.     Breath sounds: Normal breath sounds.  Musculoskeletal:     Cervical back: Neck supple.     Right knee: Crepitus present. No swelling, deformity or effusion.     Left  knee: Crepitus present. No swelling, deformity or effusion.  Skin:    General: Skin is warm and dry.  Neurological:     Mental Status: He is alert and oriented to person, place, and time.  Psychiatric:        Mood and Affect: Mood normal.        Behavior: Behavior normal.        Thought Content: Thought content normal.        Judgment: Judgment normal.      No results found for any visits on 08/31/22.    The ASCVD Risk score (Arnett DK, et al., 2019) failed to calculate for the following reasons:   The 2019 ASCVD risk score is only valid for ages 62 to 56    Assessment & Plan:   Problem List Items Addressed This Visit       Cardiovascular and Mediastinum   Essential hypertension    Chronic, well-controlled with current regimen.  Continue on amlodipine 10 mg/day and losartan 50 mg/day.  Check metabolic panel today. Further recommendations may be made based  on these results.          Nervous and Auditory   Dementia (HCC)    Chronic, does appear to be getting progressively worse.  Patient to continue following up with neurology and to continue Namenda 10 mg/day.  Educated patient and wife on environmental factors that can assist with orientation such as large font calendar and digital clocks as well as the importance of preventing falls with encouragement of assistive device use.        Genitourinary   CKD (chronic kidney disease) stage 3, GFR 30-59 ml/min (HCC)    Chronic, check BMP for stability.  Further recommendations may be made based upon his results.        Other   Unintentional weight loss   History of renal cell carcinoma    Chronic, continue follow-up with urology as scheduled.      Knee joint stiffness, bilateral - Primary    I think this is probably due from muscle atrophy of muscles surrounding the knee.  Due to stiffness and improvement with movement we will order some labs to rule out rheumatoid arthritis as well as get x-rays today.  For now I recommend physical therapy to assist with exercises to strengthen his muscles, patient would prefer to not do physical therapy at this time.  They will look into other exercise options such as a foot pedal that he can use to exercise at home.  Encouraged him to continue using his assistive devices as well to prevent falls.      Relevant Orders   Basic metabolic panel   Cyclic citrul peptide antibody, IgG ([facility])   Rheumatoid Factor   Antinuclear Antib (ANA)   DG Knee 1-2 Views Left   DG Knee 1-2 Views Right    Return in about 3 months (around 11/29/2022) for f/u with Reagen Haberman (40 minutes).  Total time this encounter was 45 minutes which included face-to-face interaction with the patient, chart review, and development/discussion of treatment plan.   Ailene Ards, NP

## 2022-08-31 NOTE — Assessment & Plan Note (Signed)
Chronic, continue follow-up with urology as scheduled.

## 2022-08-31 NOTE — Assessment & Plan Note (Signed)
Chronic, well-controlled with current regimen.  Continue on amlodipine 10 mg/day and losartan 50 mg/day.  Check metabolic panel today. Further recommendations may be made based on these results.

## 2022-08-31 NOTE — Assessment & Plan Note (Signed)
I think this is probably due from muscle atrophy of muscles surrounding the knee.  Due to stiffness and improvement with movement we will order some labs to rule out rheumatoid arthritis as well as get x-rays today.  For now I recommend physical therapy to assist with exercises to strengthen his muscles, patient would prefer to not do physical therapy at this time.  They will look into other exercise options such as a foot pedal that he can use to exercise at home.  Encouraged him to continue using his assistive devices as well to prevent falls.

## 2022-08-31 NOTE — Assessment & Plan Note (Signed)
Chronic, does appear to be getting progressively worse.  Patient to continue following up with neurology and to continue Namenda 10 mg/day.  Educated patient and wife on environmental factors that can assist with orientation such as large font calendar and digital clocks as well as the importance of preventing falls with encouragement of assistive device use.

## 2022-08-31 NOTE — Assessment & Plan Note (Signed)
Chronic, check BMP for stability.  Further recommendations may be made based upon his results.

## 2022-09-01 ENCOUNTER — Other Ambulatory Visit: Payer: Self-pay | Admitting: Nurse Practitioner

## 2022-09-01 DIAGNOSIS — M879 Osteonecrosis, unspecified: Secondary | ICD-10-CM

## 2022-09-01 LAB — CYCLIC CITRUL PEPTIDE ANTIBODY, IGG: Cyclic Citrullin Peptide Ab: <16 UNITS

## 2022-09-01 LAB — ANA: Anti Nuclear Antibody (ANA): NEGATIVE

## 2022-09-01 LAB — RHEUMATOID FACTOR: Rheumatoid fact SerPl-aCnc: <14 IU/mL (ref <14)

## 2022-09-12 ENCOUNTER — Ambulatory Visit: Payer: Medicare Other | Admitting: Neurology

## 2022-09-12 ENCOUNTER — Encounter: Payer: Self-pay | Admitting: Neurology

## 2022-09-12 ENCOUNTER — Ambulatory Visit: Payer: Medicare Other | Admitting: Physician Assistant

## 2022-09-12 ENCOUNTER — Ambulatory Visit (INDEPENDENT_AMBULATORY_CARE_PROVIDER_SITE_OTHER): Payer: Medicare Other | Admitting: Orthopaedic Surgery

## 2022-09-12 VITALS — BP 134/70 | HR 58 | Ht 70.0 in | Wt 168.0 lb

## 2022-09-12 DIAGNOSIS — F0280 Dementia in other diseases classified elsewhere without behavioral disturbance: Secondary | ICD-10-CM | POA: Diagnosis not present

## 2022-09-12 DIAGNOSIS — F0150 Vascular dementia without behavioral disturbance: Secondary | ICD-10-CM

## 2022-09-12 DIAGNOSIS — G8929 Other chronic pain: Secondary | ICD-10-CM | POA: Diagnosis not present

## 2022-09-12 DIAGNOSIS — M25561 Pain in right knee: Secondary | ICD-10-CM | POA: Diagnosis not present

## 2022-09-12 DIAGNOSIS — G309 Alzheimer's disease, unspecified: Secondary | ICD-10-CM

## 2022-09-12 DIAGNOSIS — M25562 Pain in left knee: Secondary | ICD-10-CM | POA: Diagnosis not present

## 2022-09-12 MED ORDER — BUPIVACAINE HCL 0.5 % IJ SOLN
2.0000 mL | INTRAMUSCULAR | Status: AC | PRN
  Administered 2022-09-12: 2 mL via INTRA_ARTICULAR

## 2022-09-12 MED ORDER — METHYLPREDNISOLONE ACETATE 40 MG/ML IJ SUSP
40.0000 mg | INTRAMUSCULAR | Status: AC | PRN
  Administered 2022-09-12: 40 mg via INTRA_ARTICULAR

## 2022-09-12 MED ORDER — MEMANTINE HCL 10 MG PO TABS: ORAL_TABLET | ORAL | 3 refills | Status: DC

## 2022-09-12 MED ORDER — LIDOCAINE HCL 1 % IJ SOLN
2.0000 mL | INTRAMUSCULAR | Status: AC | PRN
  Administered 2022-09-12: 2 mL

## 2022-09-12 MED ORDER — ESCITALOPRAM OXALATE 10 MG PO TABS: ORAL_TABLET | ORAL | 3 refills | Status: DC

## 2022-09-12 NOTE — Progress Notes (Signed)
Office Visit Note   Patient: Zachary Hall           Date of Birth: 01-Jul-1935           MRN: [id number] Visit Date: 09/12/2022              Requested by: Ailene Ards, NP [place],  [place] [address] PCP: Ailene Ards, NP   Assessment & Plan: Visit Diagnoses:  1. Chronic pain of both knees     Plan: Impression is subjective chronic bilateral knee instability from underlying arthritis.  Dementia also plays a role.  I did propose proceeding with diagnostic and hopefully therapeutic bilateral knee cortisone injections for which he would like to proceed.  Has done PT in the past without much improvement per wife.  Follow up as needed.  Follow-Up Instructions: Return if symptoms worsen or fail to improve.   Orders:  No orders of the defined types were placed in this encounter.  No orders of the defined types were placed in this encounter.     Procedures: Large Joint Inj: bilateral knee on 09/12/2022 7:31 PM Indications: pain Details: 22 G needle  Arthrogram: No  Medications (Right): 2 mL lidocaine 1 %; 2 mL bupivacaine 0.5 %; 40 mg methylPREDNISolone acetate 40 MG/ML Medications (Left): 2 mL lidocaine 1 %; 2 mL bupivacaine 0.5 %; 40 mg methylPREDNISolone acetate 40 MG/ML Outcome: tolerated well, no immediate complications Patient was prepped and draped in the usual sterile fashion.       Clinical Data: No additional findings.   Subjective: Chief Complaint  Patient presents with   Right Knee - Pain    HPI patient is a pleasant 88 year old gentleman with underlying dementia who comes in today with bilateral knee weakness.  He is here with his caregiver.  She tells me that he seems to complain when he goes from a seated to standing position.  He is not complaining of actual pain but of weakness.  He has been been in 6 weeks of physical therapy which minimally helped.  No previous cortisone injection.  He does take Tylenol which she has been on for a  while.  His PCP advised him not to take any NSAIDs.  No history of lumbar pathology.  Review of Systems As detailed in HPI.  All others reviewed and are negative.  Objective: Vital Signs: There were no vitals taken for this visit.  Physical Exam well-developed well-nourished gentleman in no acute distress.  Ortho Exam bilateral knee exam show no effusion.  Range of motion 0 to 100 degrees.  Medial joint line tenderness.  Ligaments are stable.  He is neurovascular intact distally.  Specialty Comments:  No specialty comments available.  Imaging: No new imaging   PMFS History: Patient Active Problem List   Diagnosis Date Noted   History of renal cell carcinoma 08/31/2022   Knee joint stiffness, bilateral 08/31/2022   CKD (chronic kidney disease) stage 3, GFR 30-59 ml/min (HCC) 06/01/2022   Constipation 06/01/2022   Anemia 05/11/2022   Thrombocytopenia ([place]) 04/13/2022   Fall 04/13/2022   Unintentional weight loss 02/23/2022   Fatigue 02/23/2022   Balance problem 01/27/2022   Hyperglycemia 01/27/2022   Dementia ([place]) 01/27/2022   Dementia due to Alzheimer's disease ([place]) 09/07/2021   Macular pucker, left eye 08/23/2021   History of partial nephrectomy 08/21/2021   Type A blood, Rh positive 08/21/2021   Hyponatremia 08/21/2021   IFG (impaired fasting glucose) 08/21/2021   Low hemoglobin  08/21/2021   Renal mass 02/25/2021   Right epiretinal membrane 02/10/2020   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   Pseudophakia of both eyes 02/10/2020   Posterior vitreous detachment of right eye 02/10/2020   Right inguinal hernia 01/21/2017   Mild cognitive impairment 09/29/2016   Essential hypertension 09/16/2015   Hyperlipidemia 09/16/2015   Stasis dermatitis 05/14/2014   Arteriosclerosis of coronary artery 05/04/2014   RBBB (right bundle branch block) 09/02/2013   H/O coronary artery bypass surgery 12/15/2010   Presence of aortocoronary bypass graft 12/15/2010   Bradycardia  11/21/2010   Coronary artery disease of bypass graft of native heart with stable angina pectoris (HCC)    History of atrial fibrillation    Past Medical History:  Diagnosis Date   Arthritis    Balance problem    Barrett esophagus    Bradycardia    Coronary artery disease    GERD (gastroesophageal reflux disease)    Gout    History of atrial fibrillation    Previously on amiodarone   Hx of CABG    Hypercholesterolemia    Hypertension    Left renal mass    Melanoma ([place])    Left shoulder blade   Nocturia    Right epiretinal membrane 02/10/2020    Family History  Problem Relation Age of Onset   Alcohol abuse Father    Colon cancer Mother    Cancer Mother        colon and bone   Heart attack Neg Hx    Hypertension Neg Hx    Stroke Neg Hx     Past Surgical History:  Procedure Laterality Date   CARDIAC CATHETERIZATION  01/04/2010   COLONOSCOPY     CORONARY ARTERY BYPASS GRAFT  02/07/2010   LIMA to LAD, SVG to 2nd DX, SVG to OM   EYE SURGERY Bilateral    Cataract Extraction   HEMORRHOID SURGERY     x 2   HEMORRHOID SURGERY N/A 11/13/2012   Procedure: [place] Hemorrhoidectomy;  Surgeon: Merrie Roof, MD;  Location: [place];  Service: General;  Laterality: N/A;   INGUINAL HERNIA REPAIR Right 01/22/2017   Procedure: RIGHT INGUINAL HERNIA REPAIR WITH MESH;  Surgeon: Armandina Gemma, MD;  Location: [facility];  Service: General;  Laterality: Right;   INSERTION OF MESH Right 01/22/2017   Procedure: INSERTION OF MESH;  Surgeon: Armandina Gemma, MD;  Location: [facility];  Service: General;  Laterality: Right;   IR RADIOLOGIST EVAL & MGMT  01/06/2021   NASAL POLYP EXCISION     OTHER SURGICAL HISTORY  06/05/2002   Penile Implant   ROBOTIC ASSITED PARTIAL NEPHRECTOMY Left 02/25/2021   Procedure: XI ROBOTIC ASSITED PARTIAL NEPHRECTOMY;  Surgeon: Ceasar Mons, MD;  Location: [facility];  Service: Urology;  Laterality: Left;   UPPER GI ENDOSCOPY     Social History   Occupational History    Occupation: Retired  Tobacco Use   Smoking status: Former    Types: Cigarettes    Quit date: 01/07/1961    Years since quitting: 61.7   Smokeless tobacco: Former  Scientific laboratory technician Use: Never used  Substance and Sexual Activity   Alcohol use: Yes    Alcohol/week: 4.0 standard drinks of alcohol    Types: 4 Cans of beer per week   Drug use: No   Sexual activity: Not on file

## 2022-09-12 NOTE — Patient Instructions (Addendum)
It is good to see you. 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 There are some parkinsonian features but you do not fit criteria for Parkinson's disease at this time. Your brain disorder can cause lack of insight into your condition, it is very important that a plan is set up for getting more help moving forward. I recommend either getting more help or moving to a facility where you can get the help you will need.   Start Lexapro (Escitalopram) 57m: take 1 tablet every night. Update me in 3 months, we may increase dose as tolerated  2. Continue Memantine 141mtwice a day  3. Follow-up in 6 months with Memory Disorders PA SaSharene Butters

## 2022-09-12 NOTE — Progress Notes (Signed)
NEUROLOGY FOLLOW UP OFFICE NOTE  QUEST HANDRICH JN:[id number] 12/24/1934  HISTORY OF PRESENT ILLNESS: I had the pleasure of seeing Zachary Hall in follow-up in the neurology clinic on 09/12/2022.  The patient was last seen by Memory Disorders PA Sharene Butters 3 months ago for dementia with behavioral disturbance. He is again accompanied by his wife who provides the history today.  Records and images were personally reviewed where available.  MMSE 16/30 in 05/2022 (21/30 in 08/2021). She contacted our office last month that his dementia seemed to be escalating, he is very paranoid about things and having some hallucinations. Urinalysis did not show any infection. She states he has accused her several times of running around on him. He loses frame of times and gets irritated with her when she says no. She is upset he did not remember her birthday or their anniversary. She states he is not willing to help if they would move to [facility]. He initially agreed then changed his mind about moving. She is worried that his son is taking advantage of him asking for money and he cannot say no. He does not want to sell his truck saying they will need it later on. He does not drive. When they discussed moving, his first question was could he bring his guns, however they have gotten rid of the guns at home already. He is on Memantine 14m BID without side effects.   MRI brain in 08/2021 showed moderate chronic microvascular disease, mild to moderate cerebral atrophy, disproportionately affecting the medial temporal lobes/hippocampi.    HPI 09/14/15: This is an 88yo RH man with a history of CAD s/p CABG, bifascicular block, hyperlipidemia, remote history of paroxysmal atrial fibrillation, hypertension with worsening memory. He feels his memory is "pretty good," but does notice more short-term memory difficulties. He is a retired bCustomer service manager His wife started noticing memory changes after his heart surgery in 2011, but feels  that changes are more of a personality change. He is more irritable, sometimes ignoring his wife even if she is speaking right in front of him ("selective hearing"). He occasionally repeats himself. He and his wife deny getting lost driving, but she feels like his mind wanders when he is driving, sometimes not paying attention to what is in front of him when he gets distracted. His wife reports changes are "lots of little changes," he would have difficulty changing from Plan A to Plan B, in the past he used to be quick to decide. His wife fixes his pillbox for him and sometimes notices he skips a day. She is in charge of bill payments. She denies any paranoia, but feels that he has low self-esteem, saying "oh I'm just a bad person," or "I can't do anything right." When he was in cardiac rehab, he reports that therapists kept asking him if he was depressed. He denies any family history of dementia, no significant head injuries. He has reduced alcohol intake but wife reports he drinks a small glass of JShearon Stallsevery night.  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   PAST MEDICAL HISTORY: Past Medical History:  Diagnosis Date   Arthritis    Balance problem    Barrett esophagus    Bradycardia    Coronary artery disease    GERD (gastroesophageal reflux disease)    Gout    History of atrial fibrillation    Previously on amiodarone   Hx of CABG    Hypercholesterolemia  Hypertension    Left renal mass    Melanoma ([place])    Left shoulder blade   Nocturia    Right epiretinal membrane 02/10/2020    MEDICATIONS: Outpatient Encounter Medications as of 09/12/2022  Medication Sig   ammonium lactate (AMLACTIN) 12 % cream Apply 1 g topically as needed for dry skin.   atorvastatin (LIPITOR) 10 MG tablet Take 0.5 tablets (5 mg total) by mouth daily at 6 PM.   cetirizine (ZYRTEC) 10 MG tablet Take 10 mg by mouth daily.   docusate sodium (COLACE) 100 MG capsule Take 1 capsule (100 mg total) by mouth 2  (two) times daily.       losartan (COZAAR) 50 MG tablet TAKE 1 TABLET BY MOUTH EVERY DAY   Magnesium Hydroxide (MAGNESIA PO) Take by mouth.   Multiple Vitamin (MULTIVITAMIN) tablet Take 1 tablet by mouth daily.   nitroGLYCERIN (NITROSTAT) 0.4 MG SL tablet *PLACE 1 TABLET UNDER TONGUE EVERY 5 MINS., UP TO 3 DOSES AS NEEDED FOR CHEST PAIN*   omeprazole (PRILOSEC) 40 MG capsule Take 40 mg by mouth daily before breakfast.           memantine (NAMENDA) 10 MG tablet 1 tablet twice a day   mupirocin ointment (BACTROBAN) 2 % Apply 1 application. topically 2 (two) times daily. (Patient not taking: Reported on 09/12/2022)   promethazine (PHENERGAN) 12.5 MG tablet Take 1 tablet (12.5 mg total) by mouth every 4 (four) hours as needed for nausea or vomiting. (Patient not taking: Reported on 09/12/2022)   traMADol (ULTRAM) 50 MG tablet Take 1-2 tablets (50-100 mg total) by mouth every 6 (six) hours as needed for moderate pain or severe pain. (Patient not taking: Reported on 09/12/2022)   No facility-administered encounter medications on file as of 09/12/2022.    ALLERGIES: Allergies  Allergen Reactions   Lisinopril Other (See Comments)    Cough    FAMILY HISTORY: Family History  Problem Relation Age of Onset   Alcohol abuse Father    Colon cancer Mother    Cancer Mother        colon and bone   Heart attack Neg Hx    Hypertension Neg Hx    Stroke Neg Hx     SOCIAL HISTORY: Social History   Socioeconomic History   Marital status: Married    Spouse name: Not on file   Number of children: 1   Years of education: Not on file   Highest education level: Not on file  Occupational History   Occupation: Retired  Tobacco Use   Smoking status: Former    Types: Cigarettes    Quit date: 01/07/1961    Years since quitting: 61.7   Smokeless tobacco: Former  Scientific laboratory technician Use: Never used  Substance and Sexual Activity   Alcohol use: Yes    Alcohol/week: 4.0 standard drinks of alcohol     Types: 4 Cans of beer per week   Drug use: No   Sexual activity: Not on file  Other Topics Concern   Not on file  Social History Narrative   Right handed   Caffeine prn   Two story home   Retired   Lives with wife   Social Determinants of Radio broadcast assistant Strain: Not on file  Food Insecurity: Not on file  Transportation Needs: Not on file  Physical Activity: Not on file  Stress: Not on file  Social Connections: Not on file  Intimate Partner Violence: Not on  file     PHYSICAL EXAM: Vitals:   09/12/22 1530  BP: 134/70  Pulse: (!) 58  SpO2: 99%   General: No acute distress Head:  Normocephalic/atraumatic Skin/Extremities: No rash, no edema Neurological Exam: alert and oriented to person, place. No aphasia or dysarthria. Fund of knowledge is appropriate.  Recent and remote memory are impaired.  Attention and concentration are reduced. MMSE 16/30    06/08/2022    6:00 PM 09/07/2021   11:00 AM 10/23/2017    1:00 PM  MMSE - Mini Mental State Exam  Orientation to time 3 3 4  $ Orientation to Place 5 5 5  $ Registration 1 3 3  $ Attention/ Calculation 0 4 4  Recall 0 0 0  Language- name 2 objects 2 2 2  $ Language- repeat 1 1 1  $ Language- follow 3 step command 3 2 3  $ Language- read & follow direction 1 1 1  $ Write a sentence 0 0 1  Copy design 0 0 1  Total score 16 21 25   $ Cranial nerves: Pupils equal, round. Extraocular movements intact with no nystagmus. Visual fields full.  No facial asymmetry.  Motor: Bulk and tone normal, no cogwheeling, muscle strength 5/5 throughout with no pronator drift.   Finger to nose testing intact.  Gait slow and cautious, no ataxia. Negative pull test   IMPRESSION: Zachary Hall is a very pleasant 88 y.o. 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 MRI brain showed moderate chronic microvascular disease and diffuse atrophy especially affecting the medial temporal lobes. MMSE 16/30 in 05/2022. Continue Memantine 51m BID. He is having more behavioral changes, his wife is concerned his son is taking advantage of him. Advanced directives advised. We discussed that at this time he does not fit criteria for Parkinson's disease, continue to monitor. Discussed need for increased help either at home or moving to ALF. They are agreeable to start Lexapro 160mdaily for mood changes. Side effects discussed, wife will update in 3 months, we may increase dose if needed. Follow-up with Memory Disorders PA SaSharene Buttersn 6 months, call for any changes.   Thank you for allowing me to participate in his care.  Please do not hesitate to call for any questions or concerns.    KaEllouise NewerM.D.   CC: SaJeralyn RuthsNP

## 2022-09-13 ENCOUNTER — Other Ambulatory Visit: Payer: Self-pay | Admitting: Cardiology

## 2022-09-14 ENCOUNTER — Telehealth: Payer: Self-pay | Admitting: Cardiology

## 2022-09-14 NOTE — Telephone Encounter (Signed)
Patient's wife is calling stating her and her husband are trying to move into [facility]. Due to this they are needing further information from the patient's last appt in order to do so. She is requesting the patient's last OV note be sent via mail to them at the address listed. Please advise.

## 2022-09-14 NOTE — Telephone Encounter (Signed)
Attempted to call patient. Unable to leave voicemail.  

## 2022-09-25 ENCOUNTER — Telehealth: Payer: Self-pay

## 2022-09-25 NOTE — Telephone Encounter (Signed)
Patients wife came into the office wanting [place] to send the last OV notes to [employer] home. Specifically asked if we can put it attention to [facility], unable to get fax number but phone number is [phone] but if we contact the Sales department and ask for [place] or Natale Milch they should be able to help. Did get copy of POA paperwork, will send copy off to the scan department to be uploaded to chart.

## 2022-09-26 NOTE — Telephone Encounter (Signed)
No answer at [facility], will call again

## 2022-09-26 NOTE — Telephone Encounter (Signed)
[phone]  

## 2022-09-26 NOTE — Telephone Encounter (Signed)
Will send to [facility]

## 2022-09-28 ENCOUNTER — Telehealth: Payer: Self-pay | Admitting: Nurse Practitioner

## 2022-09-28 ENCOUNTER — Emergency Department
Admission: EM | Admit: 2022-09-28 | Discharge: 2022-09-29 | Disposition: A | Discharge status: Home / Self Care | Payer: Medicare Other | Attending: Emergency Medicine | Admitting: Emergency Medicine

## 2022-09-28 ENCOUNTER — Emergency Department: Payer: Medicare Other

## 2022-09-28 DIAGNOSIS — R519 Headache, unspecified: Secondary | ICD-10-CM | POA: Insufficient documentation

## 2022-09-28 DIAGNOSIS — R531 Weakness: Secondary | ICD-10-CM | POA: Insufficient documentation

## 2022-09-28 DIAGNOSIS — I2510 Atherosclerotic heart disease of native coronary artery without angina pectoris: Secondary | ICD-10-CM | POA: Diagnosis not present

## 2022-09-28 DIAGNOSIS — F0390 Unspecified dementia without behavioral disturbance: Secondary | ICD-10-CM | POA: Insufficient documentation

## 2022-09-28 DIAGNOSIS — I10 Essential (primary) hypertension: Secondary | ICD-10-CM | POA: Diagnosis not present

## 2022-09-28 DIAGNOSIS — R079 Chest pain, unspecified: Secondary | ICD-10-CM | POA: Diagnosis not present

## 2022-09-28 DIAGNOSIS — R442 Other hallucinations: Secondary | ICD-10-CM | POA: Insufficient documentation

## 2022-09-28 DIAGNOSIS — M542 Cervicalgia: Secondary | ICD-10-CM | POA: Diagnosis not present

## 2022-09-28 LAB — URINALYSIS, ROUTINE W REFLEX MICROSCOPIC
Bacteria, UA: NONE SEEN
Bilirubin Urine: NEGATIVE
Glucose, UA: NEGATIVE mg/dL
Hgb urine dipstick: NEGATIVE
Ketones, ur: NEGATIVE mg/dL
Leukocytes,Ua: NEGATIVE
Nitrite: NEGATIVE
Protein, ur: 100 mg/dL — AB
Specific Gravity, Urine: 1.029 (ref 1.005–1.030)
pH: 5 (ref 5.0–8.0)

## 2022-09-28 LAB — CBC
HCT: 38.9 % — ABNORMAL LOW (ref 39.0–52.0)
Hemoglobin: 12.7 g/dL — ABNORMAL LOW (ref 13.0–17.0)
MCH: 34 pg (ref 26.0–34.0)
MCHC: 32.6 g/dL (ref 30.0–36.0)
MCV: 104 fL — ABNORMAL HIGH (ref 80.0–100.0)
Platelets: 146 10*3/uL — ABNORMAL LOW (ref 150–400)
RBC: 3.74 MIL/uL — ABNORMAL LOW (ref 4.22–5.81)
RDW: 12.8 % (ref 11.5–15.5)
WBC: 9.6 10*3/uL (ref 4.0–10.5)
nRBC: 0 % (ref 0.0–0.2)

## 2022-09-28 LAB — COMPREHENSIVE METABOLIC PANEL
ALT: 9 U/L (ref 0–44)
AST: 14 U/L — ABNORMAL LOW (ref 15–41)
Albumin: 3.9 g/dL (ref 3.5–5.0)
Alkaline Phosphatase: 55 U/L (ref 38–126)
Anion gap: 12 (ref 5–15)
BUN: 44 mg/dL — ABNORMAL HIGH (ref 8–23)
CO2: 21 mmol/L — ABNORMAL LOW (ref 22–32)
Calcium: 9.1 mg/dL (ref 8.9–10.3)
Chloride: 108 mmol/L (ref 98–111)
Creatinine, Ser: 1.24 mg/dL (ref 0.61–1.24)
GFR, Estimated: 57 mL/min — ABNORMAL LOW (ref >60)
Glucose, Bld: 115 mg/dL — ABNORMAL HIGH (ref 70–99)
Potassium: 3.7 mmol/L (ref 3.5–5.1)
Sodium: 141 mmol/L (ref 135–145)
Total Bilirubin: 0.6 mg/dL (ref 0.3–1.2)
Total Protein: 6.6 g/dL (ref 6.5–8.1)

## 2022-09-28 LAB — TROPONIN I (HIGH SENSITIVITY): Troponin I (High Sensitivity): 7 ng/L (ref <18)

## 2022-09-28 NOTE — Telephone Encounter (Signed)
Pt's wife was returning a call from [place]. Please call pt back at:  [phone]

## 2022-09-28 NOTE — Telephone Encounter (Signed)
Spoke to pt wife, pt stated husband was given steroid in both knees, not sure it is helping.  Also stated that neurology prescribed him Lexapro, which is causing him to have some pressure (pt wife stated "he fills like there is a rock on his brain") in his head, neck pain is also back and hallucination is getting worse with medication.

## 2022-09-28 NOTE — ED Triage Notes (Signed)
Pt family sts that pt was put on a new medication by his neurologist and they think that pt is going into serotonin syndrome. PT is having headache and neck pain

## 2022-09-28 NOTE — ED Provider Notes (Signed)
[facility] Provider Note    Event Date/Time   First MD Initiated Contact with Patient 09/28/22 1937     (approximate)   History   Chief Complaint Medication Reaction   HPI  Zachary Hall is a 88 y.o. male with past medical history of hypertension, hyperlipidemia, CAD, atrial fibrillation, and dementia who presents to the ED for medication reaction.  Wife states that patient was recently started on Escitalopram about 2 weeks ago, has seemed to be declining since then.  She states that he has had trouble walking and seems very unsteady on his feet with stiffness at times.  His legs have been giving out on him at times and he has complained of a headache.  He reportedly told his wife it "feels there is a rock on his brain."  Wife also reports that he has had increased hallucinations.  Patient currently denies any complaints, states that he feels well.  He denies any recent fevers, cough, chest pain, shortness of breath, nausea, vomiting, diarrhea, or dysuria.     Physical Exam   Triage Vital Signs: ED Triage Vitals [09/28/22 1750]  Enc Vitals Group     BP (!) 144/74     Pulse Rate 62     Resp 20     Temp (!) 97.5 F (36.4 C)     Temp Source Oral     SpO2 99 %     Weight      Height      Head Circumference      Peak Flow      Pain Score      Pain Loc      Pain Edu?      Excl. in [place]?     Most recent vital signs: Vitals:   09/28/22 2130 09/28/22 2200  BP: (!) 163/69 (!) 168/65  Pulse: (!) 39 (!) 57  Resp: 16 18  Temp:  97.8 F (36.6 C)  SpO2: 93% 98%    Constitutional: Alert and oriented to person and place, but not time or situation. Eyes: Conjunctivae are normal. Head: Atraumatic. Nose: No congestion/rhinnorhea. Mouth/Throat: Mucous membranes are moist.  Neck: Supple with no meningismus. Cardiovascular: Normal rate, regular rhythm. Grossly normal heart sounds.  2+ radial pulses bilaterally. Respiratory: Normal respiratory effort.   No retractions. Lungs CTAB. Gastrointestinal: Soft and nontender. No distention. Musculoskeletal: No lower extremity tenderness nor edema.  Neurologic:  Normal speech and language. No gross focal neurologic deficits are appreciated.    ED Results / Procedures / Treatments   Labs (all labs ordered are listed, but only abnormal results are displayed) Labs Reviewed  CBC - Abnormal; Notable for the following components:      Result Value   RBC 3.74 (*)    Hemoglobin 12.7 (*)    HCT 38.9 (*)    MCV 104.0 (*)    Platelets 146 (*)    All other components within normal limits  COMPREHENSIVE METABOLIC PANEL - Abnormal; Notable for the following components:   CO2 21 (*)    Glucose, Bld 115 (*)    BUN 44 (*)    AST 14 (*)    GFR, Estimated 57 (*)    All other components within normal limits  URINALYSIS, ROUTINE W REFLEX MICROSCOPIC - Abnormal; Notable for the following components:   Color, Urine YELLOW (*)    APPearance HAZY (*)    Protein, ur 100 (*)    All other components within normal limits  TROPONIN  I (HIGH SENSITIVITY)     EKG  ED ECG REPORT I, Blake Divine, the attending physician, personally viewed and interpreted this ECG.   Date: 09/28/2022  EKG Time: 21:00  Rate: 54  Rhythm: sinus bradycardia  Axis: LAD  Intervals:right bundle branch block and left anterior fascicular block  ST&T Change: None  RADIOLOGY Chest x-ray reviewed and interpreted by me with no infiltrate, edema, or effusion.  PROCEDURES:  Critical Care performed: No  Procedures   MEDICATIONS ORDERED IN ED: Medications - No data to display   IMPRESSION / MDM / [place] / ED COURSE  I reviewed the triage vital signs and the nursing notes.                              88 y.o. male with past medical history of dementia, hypertension, hyperlipidemia, CAD, and atrial fibrillation who presents to the ED complaining of increasing hallucinations, headache, and weakness since starting  on Lexapro 2 weeks ago.  Patient's presentation is most consistent with acute presentation with potential threat to life or bodily function.  Differential diagnosis includes, but is not limited to, stroke, electrolyte abnormality, anemia, AKI, intracranial hemorrhage, pneumonia, UTI, medication effect.  Patient nontoxic-appearing and in no acute distress, vital signs are unremarkable.  He is disoriented but appears at his cognitive baseline per wife with no focal neurologic deficits on exam.  CT head is negative for acute process, labs thus far are reassuring with no significant anemia,, leukocytosis, electrolyte abnormality, or AKI.  LFTs are also unremarkable, chest x-ray shows no evidence of pneumonia or other acute process.  We will add on troponin and check urinalysis.  Symptoms could potentially be related to serotonin syndrome, but he displays no clonus or significant stiffness on exam.  No vital sign abnormality consistent with severe serotonin syndrome.  Troponin within normal limits and urinalysis shows no signs of infection.  Patient ambulatory here in the ED with minimal assistance and is appropriate for discharge home with neurology follow-up. Wife counseled to stop Lexapro for now and to follow-up with his neurologist, otherwise return to the ED for new or worsening symptoms.  Patient and spouse agree with plan.      FINAL CLINICAL IMPRESSION(S) / ED DIAGNOSES   Final diagnoses:  Generalized weakness     Rx / DC Orders   ED Discharge Orders     None        Note:  This document was prepared using Dragon voice recognition software and may include unintentional dictation errors.   Blake Divine, MD 09/28/22 2325

## 2022-09-29 ENCOUNTER — Telehealth: Payer: Self-pay | Admitting: Anesthesiology

## 2022-09-29 ENCOUNTER — Telehealth: Payer: Self-pay

## 2022-09-29 ENCOUNTER — Telehealth: Payer: Self-pay | Admitting: Physician Assistant

## 2022-09-29 NOTE — Telephone Encounter (Signed)
Pt's spouse called in and left a message with the access nurse on 09/28/22. Pt was prescribed a new medication on 09/12/22. It has made his symptoms worse. Pt has dementia and having hallucinations. Caller states she had to feed him breakfast this morning as he is having trouble doing things for himself. Caller states information on this medication status symptoms my be worse initially. The medication is Escitalopram. Caller states she held RX last night. States pt reported there was a rock or brick sitting on his brain. She believes this is pressure.  Full Access Nurse report is in [employer]

## 2022-09-29 NOTE — Telephone Encounter (Signed)
Access nurse advised ER now

## 2022-09-29 NOTE — Telephone Encounter (Signed)
No answer at 10:57 09/29/2022

## 2022-09-29 NOTE — Telephone Encounter (Signed)
Pls call wife and let her know I got ER notes and they mentioned symptoms started after Lexapro and was told to stop Lexapro. Would not start another medication until these symptoms improve, pls have her call when he is back to how he was before starting Lexapro and we can consider starting a different mood medication if she wishes. Thanks

## 2022-09-29 NOTE — Telephone Encounter (Signed)
Pt's wife called stating pt was seen in ER yesterday, states after doing testing he was diagnosed with weakness and was advised to follow up with Dr Delice Lesch soon. Pt's wife requesting an appointment as soon as possible.

## 2022-10-02 ENCOUNTER — Encounter: Payer: Self-pay | Admitting: *Deleted

## 2022-10-02 ENCOUNTER — Telehealth: Payer: Self-pay | Admitting: *Deleted

## 2022-10-02 NOTE — Telephone Encounter (Signed)
Pt wife called an informed Dr Delice Lesch  got ER notes and they mentioned symptoms started after Lexapro and was told to stop Lexapro. Would not start another medication until these symptoms improve, pls have her call when he is back to how he was before starting Lexapro and we can consider starting a different mood medication if she wishes. He stopped the medication 09/27/22 she will talk if she thinks a different medication is needed at his follow up.

## 2022-10-02 NOTE — Telephone Encounter (Signed)
Spoke to pt wife, she stated that she that the neurology office called her last week and she ended up taking him to emergency room where he was observed for abut five hours and was told to stop taking the Lexapro which she stated she did before there went to the ED.  She state she has been trying to reach the neurology office but to no avail, call the neurology office ( and asked if it possible to call pt wife by this week to answer her question in regard to setting up an appointment, which he dose on the 10/11/22 )

## 2022-10-02 NOTE — Transitions of Care (Post Inpatient/ED Visit) (Signed)
10/02/2022  Name: Zachary Hall MRN: [id number] DOB: Aug 11, 1934  Today's TOC FU Call Status: Today's TOC FU Call Status:: Successful TOC FU Call Competed (verified HIPAA identifiers x 2 with spouse [name] on [place]) TOC FU Call Complete Date: 10/02/22  Transition Care Management Follow-up Telephone Call Date of Discharge: 09/29/22 Discharge Facility: [facility]) Type of Discharge: Emergency Department Reason for ED Visit: Medication Changes (possible medication reaction) How have you been since you were released from the hospital?: Better (per spouse/ caregiver: "Overall, he is better than he was now that he is off the Lexapro; I have talked with Dr. Amparo Bristol nurse and they told me they would be calling me back this afternoon with an appointment") Any questions or concerns?: No  Items Reviewed: Did you receive and understand the discharge instructions provided?: Yes (thoroughly reviewed with patient's spouse/ caregiver who verbalizes very good understanding of same) Medications obtained and verified?: Yes (Medications Reviewed) (full medication review completed; no concerns or discrepancies  identified; caregiver denies questions/ concerns around medications; caregiver- spouse manages all aspects of medication administration) Any new allergies since your discharge?: No Dietary orders reviewed?: Yes Type of Diet Ordered:: heart healthy Do you have support at home?: Yes People in Home: spouse Name of Support/Comfort Primary Source: [name], spouse on [place]; she manages all aspects of patient's medical care; she assists pt. with all ADL's self-care activities  Home Care and Equipment/Supplies: [place] Ordered?: No Any new equipment or medical supplies ordered?: No  Functional Questionnaire: Do you need assistance with bathing/showering or dressing?: Yes Do you need assistance with meal preparation?: Yes Do you need assistance  with eating?: No Do you have difficulty maintaining continence: No Do you need assistance with getting out of bed/getting out of a chair/moving?: Yes Do you have difficulty managing or taking your medications?: Yes  Folllow up appointments reviewed: PCP Follow-up appointment confirmed?: No (verified no PCP post-ED Office visit recommended) MD Provider Line Number:[phone] Given: No Specialist Hospital Follow-up appointment confirmed?: No Reason Specialist Follow-Up Not Confirmed: Patient has Specialist Provider Number and will Call for Appointment (confirmed caregiver/ spouse has called neurology appointment and is waiting on call back- states they have called her today and will call back this afternoon) Do you need transportation to your follow-up appointment?: No Do you understand care options if your condition(s) worsen?: Yes-patient verbalized understanding  SDOH Interventions Today    Flowsheet Row Most Recent Value  SDOH Interventions   Food Insecurity Interventions Intervention Not Indicated  Transportation Interventions Intervention Not Indicated  [spouse provides all transportation]      TOC Interventions Today    Flowsheet Row Most Recent Value  TOC Interventions   TOC Interventions Discussed/Reviewed TOC Interventions Discussed  [provided my direct contact information should questions/ concerns/ needs arise post-TOC call today]      Interventions Today    Flowsheet Row Most Recent Value  Chronic Disease   Chronic disease during today's visit Other  [possible medication reaction/ dementia]  General Interventions   General Interventions Discussed/Reviewed General Interventions Discussed, Doctor Visits  Doctor Visits Discussed/Reviewed Specialist, PCP, Doctor Visits Discussed  PCP/Specialist Visits Compliance with follow-up visit  Nutrition Interventions   Nutrition Discussed/Reviewed Nutrition Discussed  Pharmacy Interventions   Pharmacy Dicussed/Reviewed  Pharmacy Topics Discussed  [Full medication review completed]      Oneta Rack, RN, BSN, CCRN Alumnus RN CM Care Coordination/ Transition of [place] Management [phone]: direct office

## 2022-10-02 NOTE — Telephone Encounter (Signed)
Pt's wife called in again wanting to see if she can speak with someone about all the pt's medications. She feels like she keeps leaving messages and is not getting a call back.   She also would like to see if the pt can have a Parkinson's skin biopsy done? They would like to see if they can find out if the pt has Parkinson's.

## 2022-10-02 NOTE — Telephone Encounter (Signed)
Patient's wife called in requesting an appointment per discharge summary report. She is concerned with the medications that were prescribed for him in the hospital and would like a call back if possible.

## 2022-10-09 ENCOUNTER — Other Ambulatory Visit: Payer: Self-pay | Admitting: Nurse Practitioner

## 2022-10-09 DIAGNOSIS — F0390 Unspecified dementia without behavioral disturbance: Secondary | ICD-10-CM

## 2022-10-10 NOTE — Transitions of Care (Post Inpatient/ED Visit) (Signed)
   10/10/2022  Name: Zachary Hall MRN: [id number] DOB: [birth year]  Today's TOC FU Call Status:    Attempted to reach the patient regarding the most recent Inpatient/ED visit.  Follow Up Plan: No further outreach attempts will be made at this time. We have been unable to contact the patient.  Signature Juanda Crumble, [place] Direct Dial [phone]

## 2022-10-11 ENCOUNTER — Telehealth: Payer: Self-pay

## 2022-10-11 NOTE — Telephone Encounter (Signed)
Palliative care outreach to spouse to schedule initial palliative care visit/consult.  Call unsuccessful. Unable to LVM due to mailbox being full.

## 2022-10-13 ENCOUNTER — Encounter: Payer: Self-pay | Admitting: Physician Assistant

## 2022-10-13 ENCOUNTER — Ambulatory Visit: Payer: Medicare Other | Admitting: Physician Assistant

## 2022-10-13 VITALS — BP 110/60 | HR 74 | Resp 98 | Ht 70.0 in | Wt 164.0 lb

## 2022-10-13 DIAGNOSIS — F0280 Dementia in other diseases classified elsewhere without behavioral disturbance: Secondary | ICD-10-CM | POA: Diagnosis not present

## 2022-10-13 DIAGNOSIS — F0150 Vascular dementia without behavioral disturbance: Secondary | ICD-10-CM

## 2022-10-13 DIAGNOSIS — R251 Tremor, unspecified: Secondary | ICD-10-CM | POA: Diagnosis not present

## 2022-10-13 DIAGNOSIS — G309 Alzheimer's disease, unspecified: Secondary | ICD-10-CM
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
## 2022-10-13 NOTE — Patient Instructions (Signed)
It was a pleasure to see you today at our office.   Recommendations:  Follow up as scheduled  Discontinue Memantine   Discussed safety both in and out of the home.  Recommend  PT and a walker     Whom to call:  Memory  decline, memory medications: Call our office [phone]   For psychiatric meds, mood meds: Please have your primary care physician manage these medications.       For assessment of decision of mental capacity and competency:  Call Dr. Anthoney Harada, geriatric psychiatrist at [phone]  For guidance in geriatric dementia issues please call [facility] [phone]  For guidance regarding [facility] and if placement were needed at the facility, contact Arnell Asal, Social Worker tel: [phone]  If you have any severe symptoms of a stroke, or other severe issues such as confusion,severe chills or fever, etc call 911 or go to the ER as you may need to be evaluate further   Feel free to visit Facebook page " [email or web address]" for tips of how to care for people with memory problems.         RECOMMENDATIONS FOR ALL PATIENTS WITH MEMORY PROBLEMS: 1. Continue to exercise (Recommend 30 minutes of walking everyday, or 3 hours every week) 2. Increase social interactions - continue going to [place] and enjoy social gatherings with friends and family 3. Eat healthy, avoid fried foods and eat more fruits and vegetables 4. Maintain adequate blood pressure, blood sugar, and blood cholesterol level. Reducing the risk of stroke and cardiovascular disease also helps promoting better memory. 5. Avoid stressful situations. Live a simple life and avoid aggravations. Organize your time and prepare for the next day in anticipation. 6. Sleep well, avoid any interruptions of sleep and avoid any distractions in the bedroom that may interfere with adequate sleep quality 7. Avoid sugar, avoid sweets as there is a strong link between excessive sugar intake,  diabetes, and cognitive impairment We discussed the Mediterranean diet, which has been shown to help patients reduce the risk of progressive memory disorders and reduces cardiovascular risk. This includes eating fish, eat fruits and green leafy vegetables, nuts like almonds and hazelnuts, walnuts, and also use olive oil. Avoid fast foods and fried foods as much as possible. Avoid sweets and sugar as sugar use has been linked to worsening of memory function.  There is always a concern of gradual progression of memory problems. If this is the case, then we may need to adjust level of care according to patient needs. Support, both to the patient and caregiver, should then be put into place.    The Alzheimer's Association is here all day, every day for people facing Alzheimer's disease through our free 24/7 Helpline: [phone]. The Helpline provides reliable information and support to all those who need assistance, such as individuals living with memory loss, Alzheimer's or other dementia, caregivers, health care professionals and the public.  Our highly trained and knowledgeable staff can help you with: Understanding memory loss, dementia and Alzheimer's  Medications and other treatment options  General information about aging and brain health  Skills to provide quality care and to find the best care from professionals  Legal, financial and living-arrangement decisions Our Helpline also features: Confidential care consultation provided by master's level clinicians who can help with decision-making support, crisis assistance and education on issues families face every day  Help in a caller's preferred language using our translation service that features more than 200 languages  and dialects  Referrals to local community programs, services and ongoing support     FALL PRECAUTIONS: Be cautious when walking. Scan the area for obstacles that may increase the risk of trips and falls. When getting up in  the mornings, sit up at the edge of the bed for a few minutes before getting out of bed. Consider elevating the bed at the head end to avoid drop of blood pressure when getting up. Walk always in a well-lit room (use night lights in the walls). Avoid area rugs or power cords from appliances in the middle of the walkways. Use a walker or a cane if necessary and consider physical therapy for balance exercise. Get your eyesight checked regularly.  FINANCIAL OVERSIGHT: Supervision, especially oversight when making financial decisions or transactions is also recommended.  HOME SAFETY: Consider the safety of the kitchen when operating appliances like stoves, microwave oven, and blender. Consider having supervision and share cooking responsibilities until no longer able to participate in those. Accidents with firearms and other hazards in the house should be identified and addressed as well.   ABILITY TO BE LEFT ALONE: If patient is unable to contact 911 operator, consider using LifeLine, or when the need is there, arrange for someone to stay with patients. Smoking is a fire hazard, consider supervision or cessation. Risk of wandering should be assessed by caregiver and if detected at any point, supervision and safe proof recommendations should be instituted.  MEDICATION SUPERVISION: Inability to self-administer medication needs to be constantly addressed. Implement a mechanism to ensure safe administration of the medications.   DRIVING: Regarding driving, in patients with progressive memory problems, driving will be impaired. We advise to have someone else do the driving if trouble finding directions or if minor accidents are reported. Independent driving assessment is available to determine safety of driving.   If you are interested in the driving assessment, you can contact the following:  [employer] in [place]  [place] [phone] or [phone]      [place] refers to food and lifestyle choices that are based on the traditions of countries located on the [employer]. This way of eating has been shown to help prevent certain conditions and improve outcomes for people who have chronic diseases, like kidney disease and heart disease. What are tips for following this plan? Lifestyle  Cook and eat meals together with your family, when possible. Drink enough fluid to keep your urine clear or pale yellow. Be physically active every day. This includes: Aerobic exercise like running or swimming. Leisure activities like gardening, walking, or housework. Get 7-8 hours of sleep each night. If recommended by your health care provider, drink red wine in moderation. This means 1 glass a day for nonpregnant women and 2 glasses a day for men. A glass of wine equals 5 oz (150 mL). Reading food labels  Check the serving size of packaged foods. For foods such as rice and pasta, the serving size refers to the amount of cooked product, not dry. Check the total fat in packaged foods. Avoid foods that have saturated fat or trans fats. Check the ingredients list for added sugars, such as corn syrup. Shopping  At the grocery store, buy most of your food from the areas near the walls of the store. This includes: Fresh fruits and vegetables (produce). Grains, beans, nuts, and seeds. Some of these may be available  in unpackaged forms or large amounts (in bulk). Fresh seafood. Poultry and eggs. Low-fat dairy products. Buy whole ingredients instead of prepackaged foods. Buy fresh fruits and vegetables in-season from local farmers markets. Buy frozen fruits and vegetables in resealable bags. If you do not have access to quality fresh seafood, buy precooked frozen shrimp or canned fish, such as tuna, salmon, or sardines. Buy small amounts of raw or  cooked vegetables, salads, or olives from the deli or salad bar at your store. Stock your pantry so you always have certain foods on hand, such as olive oil, canned tuna, canned tomatoes, rice, pasta, and beans. Cooking  Cook foods with extra-virgin olive oil instead of using butter or other vegetable oils. Have meat as a side dish, and have vegetables or grains as your main dish. This means having meat in small portions or adding small amounts of meat to foods like pasta or stew. Use beans or vegetables instead of meat in common dishes like chili or lasagna. Experiment with different cooking methods. Try roasting or broiling vegetables instead of steaming or sauteing them. Add frozen vegetables to soups, stews, pasta, or rice. Add nuts or seeds for added healthy fat at each meal. You can add these to yogurt, salads, or vegetable dishes. Marinate fish or vegetables using olive oil, lemon juice, garlic, and fresh herbs. Meal planning  Plan to eat 1 vegetarian meal one day each week. Try to work up to 2 vegetarian meals, if possible. Eat seafood 2 or more times a week. Have healthy snacks readily available, such as: Vegetable sticks with hummus. Greek yogurt. Fruit and nut trail mix. Eat balanced meals throughout the week. This includes: Fruit: 2-3 servings a day Vegetables: 4-5 servings a day Low-fat dairy: 2 servings a day Fish, poultry, or lean meat: 1 serving a day Beans and legumes: 2 or more servings a week Nuts and seeds: 1-2 servings a day Whole grains: 6-8 servings a day Extra-virgin olive oil: 3-4 servings a day Limit red meat and sweets to only a few servings a month What are my food choices? Mediterranean diet Recommended Grains: Whole-grain pasta. Brown rice. Bulgar wheat. Polenta. Couscous. Whole-wheat bread. [place]. Vegetables: Artichokes. Beets. Broccoli. Cabbage. Carrots. Eggplant. Green beans. Chard. Kale. Spinach. Onions. Leeks. Peas. Squash. Tomatoes.  Peppers. Radishes. Fruits: Apples. Apricots. Avocado. Berries. Bananas. Cherries. Dates. Figs. Grapes. Lemons. Melon. Oranges. Peaches. Plums. Pomegranate. Meats and other protein foods: Beans. Almonds. Sunflower seeds. Pine nuts. Peanuts. [place]. Salmon. Scallops. Shrimp. [place]. Tilapia. Clams. Oysters. Eggs. Dairy: Low-fat milk. Cheese. Greek yogurt. Beverages: Water. Red wine. Herbal tea. Fats and oils: Extra virgin olive oil. Avocado oil. Grape seed oil. Sweets and desserts: [place] yogurt with honey. Baked apples. Poached pears. Trail mix. Seasoning and other foods: Basil. Cilantro. Coriander. Cumin. Mint. Parsley. Sage. Rosemary. Tarragon. Garlic. Oregano. Thyme. Pepper. Balsalmic vinegar. Tahini. Hummus. Tomato sauce. Olives. Mushrooms. Limit these Grains: Prepackaged pasta or rice dishes. Prepackaged cereal with added sugar. Vegetables: Deep fried potatoes (french fries). Fruits: Fruit canned in syrup. Meats and other protein foods: Beef. Pork. Lamb. Poultry with skin. Hot dogs. [place] Salines. Dairy: Ice cream. Sour cream. Whole milk. Beverages: Juice. Sugar-sweetened soft drinks. Beer. Liquor and spirits. Fats and oils: Butter. Canola oil. Vegetable oil. Beef fat (tallow). Lard. Sweets and desserts: Cookies. Cakes. Pies. Candy. Seasoning and other foods: Mayonnaise. Premade sauces and marinades. The items listed may not be a complete list. Talk with your dietitian about what dietary choices are right for you. Summary The [place]  diet includes both food and lifestyle choices. Eat a variety of fresh fruits and vegetables, beans, nuts, seeds, and whole grains. Limit the amount of red meat and sweets that you eat. Talk with your health care provider about whether it is safe for you to drink red wine in moderation. This means 1 glass a day for nonpregnant women and 2 glasses a day for men. A glass of wine equals 5 oz (150 mL). This information is not intended to replace advice given to you by  your health care provider. Make sure you discuss any questions you have with your health care provider. Document Released: 03/09/2016 Document Revised: 04/11/2016 Document Reviewed: 03/09/2016 Elsevier Interactive Patient Education  2017 [employer].

## 2022-10-16 ENCOUNTER — Telehealth: Payer: Self-pay

## 2022-10-16 NOTE — Telephone Encounter (Signed)
2nd attempt: Palliative care outreach to spouse to schedule initial palliative care visit/consult.   Call unsuccessful. Unable to LVM due to mailbox being full.

## 2022-10-17 ENCOUNTER — Other Ambulatory Visit: Payer: Medicare Other

## 2022-10-17 DIAGNOSIS — Z515 Encounter for palliative care: Secondary | ICD-10-CM

## 2022-10-17 NOTE — Progress Notes (Signed)
PATIENT NAME: Zachary Hall DOB: April 14, 1935 MRN: [id number]  PRIMARY CARE PROVIDER: Ailene Ards, NP  RESPONSIBLE PARTY:  Acct ID - Guarantor Home Phone Work Phone Relationship Acct Type  [id number] CALVEN, [phone]  Self P/F     [address] [place], [place] [address]   I connected with wife Zachary Hall for  [name] on 10/17/22 by telephone and verified that I am speaking with the correct person using two identifiers.   I discussed the limitations of evaluation and management by telemedicine. The patient expressed understanding and agreed to proceed.   Connected with wife Zachary Hall to schedule a home visit for next week.  She shared patient is slightly better since medication adjustments have been made.  Patient continues to fall 1 x weekly.  Refusing to use the cane or rollator.  Wife considering a standard walker for patient.  They are currently residing in the carriage house on the same property.  Wife is having work completed on the home to allow for better accessibility.   Wife agreeable for a joint visit with both RN/SW next Tuesday morning.      Lorenza Burton, RN

## 2022-10-20 ENCOUNTER — Ambulatory Visit (INDEPENDENT_AMBULATORY_CARE_PROVIDER_SITE_OTHER): Payer: Medicare Other | Admitting: Nurse Practitioner

## 2022-10-20 VITALS — BP 116/68 | HR 60 | Temp 97.6°F | Ht 70.0 in | Wt 160.4 lb

## 2022-10-20 DIAGNOSIS — M5030 Other cervical disc degeneration, unspecified cervical region: Secondary | ICD-10-CM

## 2022-10-20 DIAGNOSIS — F0280 Dementia in other diseases classified elsewhere without behavioral disturbance: Secondary | ICD-10-CM | POA: Diagnosis not present

## 2022-10-20 DIAGNOSIS — G309 Alzheimer's disease, unspecified: Secondary | ICD-10-CM | POA: Diagnosis not present

## 2022-10-20 DIAGNOSIS — R634 Abnormal weight loss: Secondary | ICD-10-CM

## 2022-10-20 NOTE — Progress Notes (Signed)
Established Patient Office Visit  Subjective   Patient ID: Zachary Hall, male    DOB: 10/25/1934  Age: 88 y.o. MRN: [id number]  Chief Complaint  Patient presents with   Medical Management of Chronic Issues    Wants to talk about sleeping med, and back neck pain   Patient accompanied by wife.   Seeing neurology for dementia, self-discontinued namenda due to not tolerating side effects. Has also stopped lexapro due to side effects and concern for serotonin syndrome. Has had difficulty sleeping through the night. Wife reports cognition has stabilized but overall seems to be worsening. Also more deconditioned has been having frequent falls at home, wife now assists him with getting dressed whereas he was able to do this independently recently. Has palliative care consult with plans to have evaluation next week. Waiting for opening at [facility]. Has neck pain, currently treating with diclofenac gel and tylenol 500mg  BID. Pain is not fully controlled with this regimen. No acute weakness, numbness per patient/wife. Cervical spine CT from last month shows evidence of DDD of C3-C4, C4-C5, C5-C6.    ROS: see HPI    Objective:     BP 116/68   Pulse 60   Temp 97.6 F (36.4 C) (Temporal)   Ht 5\' 10"  (1.778 m)   Wt 160 lb 6 oz (72.7 kg)   SpO2 94%   BMI 23.01 kg/m  BP Readings from Last 3 Encounters:  10/20/22 116/68  10/13/22 110/60  09/29/22 (!) 164/82   Wt Readings from Last 3 Encounters:  10/20/22 160 lb 6 oz (72.7 kg)  10/13/22 164 lb (74.4 kg)  09/12/22 168 lb (76.2 kg)      Physical Exam Vitals reviewed.  Constitutional:      Appearance: Normal appearance.  HENT:     Head: Normocephalic and atraumatic.  Cardiovascular:     Rate and Rhythm: Normal rate and regular rhythm.  Pulmonary:     Effort: Pulmonary effort is normal.     Breath sounds: Normal breath sounds.  Musculoskeletal:     Cervical back: Neck supple.  Skin:    General: Skin is warm and dry.   Neurological:     Mental Status: He is alert and oriented to person, place, and time.  Psychiatric:        Mood and Affect: Mood normal.        Behavior: Behavior normal.        Thought Content: Thought content normal.        Judgment: Judgment normal.      No results found for any visits on 10/20/22.    The ASCVD Risk score (Arnett DK, et al., 2019) failed to calculate for the following reasons:   The 2019 ASCVD risk score is only valid for ages 68 to 29    Assessment & Plan:   Problem List Items Addressed This Visit       Nervous and Auditory   Dementia due to Alzheimer's disease ([place]) - Primary    Chronic, continues to progress. Wife is aware of disease trajectory. Meeting with Palliative care early next week. Focus on encouraging independence with ADLs as appropriate, offer reorientation frequently, encourage patient to eat and drink small meals throughout the day, consider use of melatonin at night to help with sleeping. Continue to follow-up with neurology.        Musculoskeletal and Integument   DDD (degenerative disc disease), cervical    Chronic, patient has significant pain. No acute weakness,  sensory changes today. Overall wife seems to want to treat with medication and focus on quality of life. Recommend increasing tylenol to 500-1000mg  every 6 hours while awake as needed and not to exceed 3000mg  in 24 hours. Can also continue using diclofenac gel.        Other   Unintentional weight loss    Has lost additional 4 lbs since last visit. Per wife she would prefer to focus on keeping patient comfortable and encouraging oral intake of food, fluids, and nutritional shake supplements as opposed to in depth work-up. I agree and feel this is reasonable, no additional work up recommended today. Follow-up with palliative care as scheduled as well.        Return in about 3 months (around 01/20/2023) for F/U with Judson Roch. Total time spent on today's encounter was 33 minutes  including face to face evaluation, review of previous medical records, and development/discussion of treatment plan.    Ailene Ards, NP

## 2022-10-20 NOTE — Patient Instructions (Addendum)
Life Extension - Melatonin 1-5mg  as needed at bedtime Tylenol 500-1000mg  every 6 hours as needed for pain while awake to not exceed 3000mg  in 24 hours

## 2022-10-20 NOTE — Assessment & Plan Note (Addendum)
Chronic, continues to progress. Wife is aware of disease trajectory. Meeting with Palliative care early next week. Focus on encouraging independence with ADLs as appropriate, offer reorientation frequently, encourage patient to eat and drink small meals throughout the day, consider use of melatonin at night to help with sleeping. Continue to follow-up with neurology.

## 2022-10-20 NOTE — Assessment & Plan Note (Signed)
Chronic, patient has significant pain. No acute weakness, sensory changes today. Overall wife seems to want to treat with medication and focus on quality of life. Recommend increasing tylenol to 500-1000mg  every 6 hours while awake as needed and not to exceed 3000mg  in 24 hours. Can also continue using diclofenac gel.

## 2022-10-20 NOTE — Assessment & Plan Note (Signed)
Has lost additional 4 lbs since last visit. Per wife she would prefer to focus on keeping patient comfortable and encouraging oral intake of food, fluids, and nutritional shake supplements as opposed to in depth work-up. I agree and feel this is reasonable, no additional work up recommended today. Follow-up with palliative care as scheduled as well.

## 2022-10-24 ENCOUNTER — Other Ambulatory Visit: Payer: Medicare Other

## 2022-10-24 VITALS — BP 130/80 | HR 65 | Wt 160.0 lb

## 2022-10-24 DIAGNOSIS — Z515 Encounter for palliative care: Secondary | ICD-10-CM

## 2022-10-24 NOTE — Progress Notes (Signed)
COMMUNITY PALLIATIVE CARE SW NOTE  PATIENT NAME: Zachary Hall DOB: 06-23-1935 MRN: [id number]  PRIMARY CARE PROVIDER: Ailene Ards, NP  RESPONSIBLE PARTY:  Acct ID - Guarantor Home Phone Work Phone Relationship Acct Type  [id number] [name], [phone]  Self P/F     [address], [place], [place] [address]     PLAN OF CARE and INTERVENTIONS:              GOALS OF CARE/ ADVANCE CARE PLANNING:    Goals include to maximize quality of life and assist with pain management. Our advance care planning conversation included a discussion about:    The value and importance of advance care planning  Review and updating or creation of an advance directive document.                          Code Status: DNR. ACD: patient has POA documents, with spouse as POA,                         GOC: ongoing discussion.  2.        SOCIAL/EMOTIONAL/SPIRITUAL ASSESSMENT/ INTERVENTIONS:         Palliative care encounter: SW and RN completed initial visit with patient and spouse. PC services discussed and consent signed.   Presenting problem: patient with Alzheimer's and vascular dementia. With recent hospice referral, of which he was not admitted.   Dementia: Patient has advancing dementia, with memory loss. Patient requires assistance with meal prep and feeding at times. Patient wears briefs due to some incontinent episodes. [place] resource guide provided.  Mobility: patient requires assistance with bathing and dressing. Patient has a at least 1 fall a week for the past month due to shuffle gait. Patient has   Appetite: patient/spouse states that his appetite is fair   Psychosocial assessment: completed.   In-home support: spouse is primary caregiver. Hiring additional caregiver support discussed during visit. Spouse share that she has friends that she can reach out to for private caregiver support.   Transportation: no needs at this time.  Food: no needs at this time.  Long term  planning: Discussed. Spouse share that they have considered moving [place], but have out the process on hold at this time due to patient not wanting to go. Patient and spouse are building onto their small carriage home behind their house to stay in. Spouse share that she and patient will stay in that home until patient passes and she will transition to [facility].   SW discussed goals, reviewed care plan, provided emotional support, used active and reflective listening in the form of reciprocity emotional response.  Next palliative care visit scheduled for 4/30 @11am    3.         PATIENT/CAREGIVER EDUCATION/ COPING:     Patient A&O and able to make needs known. patient with memory deficits. No additional family support.    4.         PERSONAL EMERGENCY PLAN:  spouse will call 9-1-1 for emergencies.    5.         COMMUNITY RESOURCES COORDINATION/ HEALTH CARE NAVIGATION:  Spouses manages his care.    6.      FINANCIAL CONCERNS/NEEDS: none                         Primary Health Insurance:  [facility] Medicare Secondary Health  Insurance: none Prescription Coverage: Yes, no history of difficulty obtaining or affording prescriptions reported.     SOCIAL HX:  Social History   Tobacco Use   Smoking status: Former    Types: Cigarettes    Quit date: 01/07/1961    Years since quitting: 61.8   Smokeless tobacco: Former  Substance Use Topics   Alcohol use: Yes    Alcohol/week: 4.0 standard drinks of alcohol    Types: 4 Cans of beer per week    CODE STATUS: DNR  ADVANCED DIRECTIVES: Y MOST FORM COMPLETE: N HOSPICE EDUCATION PROVIDED: Y  PPS: 50%      [place] Henrene Pastor, LCSW

## 2022-10-24 NOTE — Progress Notes (Signed)
PATIENT NAME: Zachary Hall DOB: 27-Dec-1934 MRN: [id number]  PRIMARY CARE PROVIDER: Ailene Ards, NP  RESPONSIBLE PARTY:  Acct ID - Guarantor Home Phone Work Phone Relationship Acct Type  [id number] HILMER, [phone]  Self P/F     [address] [place], [place] [address]   ACP:  Living Will updated recently.  Discussed code status and desire is for DNR.  DNR form completed and left in the home for patient.   Dementia:  FAST Score: 6B.  Patient is ambulatory with a cane although frequently forgets to use it.  Wife considering a rollator due to the number of falls patient is having.  Averaging about 1 fall per week.  Wife is doing meal prep.  Patient is able to feed himself but wife reports it is very messy and he often needs some guidance.  He is still able to shower himself and shave.  Wife is having to do all the set up for patient.  She recently transitioned him to depends pull ups although patient is mostly continent.  Patient will engage in short conversation.  Difficulty finding words during this visit otherwise quiet unless spoken to.   Patient is having visual hallucinations per wife.  Seeing people in the garage.  He has not attempted to elope from the home at this time.  Medications were discussed with PCP and wife does not wish to pursue any medications to manage symptoms.   Palliative Care vs Hospice:  Patient was evaluated for hospice about 2-3 weeks ago but did not qualify for services.  Palliative Care services explained and consent signed.  Will continue to monitor for decline and refer back to hospice when appropriate.   Left Kidney Cancer:  Patient had partial removal of left kidney about 2 years ago.  Pain:  Continues with neck pain.  Taking 1000 mg of acetaminophen in the am and 500 mg mid-day.  This has been managing patient's pain at a tolerable level.   Resources:  Wife is currently working to have their carriage house more accessible in anticipation of  patient decline.  Bathroom accommodations are currently being worked on.   Dementia resource book provided to wife.   Wife currently has [facility] placement on hold at this time.  We discussed possible private caregivers and [facility] program at [facility].  Discussed support groups as an option for wife.   Weight Loss:  Ongoing over the last several months.  Wife notes clothing is fitting looser.   She reports he does not have a big appetite in general but his desire for sweets has declined drastically.  Wife is not interested in adding any medication at this time due to patient's sensitivity to meds.   weight 160 lbs 10/20/22 164 lbs 10/13/22 168 lbs 09/12/22 167 lbs 08/31/22 168 lbs  07/06/22 typically running 167-168 lbs in 2023    CODE STATUS: DNR ADVANCED DIRECTIVES: No MOST FORM: No PPS: 50%   PHYSICAL EXAM:   LUNGS: clear to auscultation  CARDIAC: Cor RRR}  EXTREMITIES: - for edema SKIN: Skin color, texture, turgor normal. No rashes or lesions or mobility and turgor normal  NEURO: positive for gait problems and memory problems       Lorenza Burton, RN

## 2022-10-30 ENCOUNTER — Other Ambulatory Visit: Payer: Self-pay

## 2022-10-30 ENCOUNTER — Encounter (INDEPENDENT_AMBULATORY_CARE_PROVIDER_SITE_OTHER): Payer: Medicare Other | Admitting: Ophthalmology

## 2022-10-30 DIAGNOSIS — W19XXXA Unspecified fall, initial encounter: Secondary | ICD-10-CM | POA: Diagnosis not present

## 2022-10-30 DIAGNOSIS — S3992XA Unspecified injury of lower back, initial encounter: Secondary | ICD-10-CM | POA: Diagnosis not present

## 2022-10-30 DIAGNOSIS — R0789 Other chest pain: Secondary | ICD-10-CM | POA: Insufficient documentation

## 2022-10-30 DIAGNOSIS — S0990XA Unspecified injury of head, initial encounter: Secondary | ICD-10-CM | POA: Diagnosis not present

## 2022-10-30 DIAGNOSIS — M549 Dorsalgia, unspecified: Secondary | ICD-10-CM | POA: Diagnosis not present

## 2022-10-30 DIAGNOSIS — R6889 Other general symptoms and signs: Secondary | ICD-10-CM | POA: Diagnosis not present

## 2022-10-30 DIAGNOSIS — R911 Solitary pulmonary nodule: Secondary | ICD-10-CM | POA: Diagnosis not present

## 2022-10-30 DIAGNOSIS — F02818 Dementia in other diseases classified elsewhere, unspecified severity, with other behavioral disturbance: Secondary | ICD-10-CM | POA: Diagnosis not present

## 2022-10-30 DIAGNOSIS — R42 Dizziness and giddiness: Secondary | ICD-10-CM | POA: Diagnosis not present

## 2022-10-30 DIAGNOSIS — I499 Cardiac arrhythmia, unspecified: Secondary | ICD-10-CM | POA: Diagnosis not present

## 2022-10-30 DIAGNOSIS — M5030 Other cervical disc degeneration, unspecified cervical region: Secondary | ICD-10-CM | POA: Diagnosis not present

## 2022-10-30 DIAGNOSIS — R001 Bradycardia, unspecified: Secondary | ICD-10-CM | POA: Diagnosis not present

## 2022-10-30 DIAGNOSIS — Z743 Need for continuous supervision: Secondary | ICD-10-CM | POA: Diagnosis not present

## 2022-10-30 NOTE — ED Triage Notes (Addendum)
First Nurse Note:   BIB [facility] from home. C/o fall that was unwitnessed. Pt c/o upper back pain under L shoulder blade. No daily thinners. Pt with hx of dementia. Spouse reports frequent hx of falls. Recently taken off all medications r/t dementia due to allergies.   198/89 HR 55 98% RA  CBG 92

## 2022-10-30 NOTE — ED Triage Notes (Signed)
Patient arrived via EMS from home. Wife reports patient with moderate-to-severe dementia and frequent falls and that patient had unwitnessed fall approximately 30 minutes prior to arrival. Patient alert, poor historian and extremely hard of hearing. Denies pain currently. Resp even, unlabored on RA. Denies thinners. Wife states patient has been reporting intermittent dizziness prior to the fall.

## 2022-10-31 ENCOUNTER — Emergency Department
Admission: EM | Admit: 2022-10-31 | Discharge: 2022-10-31 | Disposition: A | Discharge status: Home / Self Care | Payer: Medicare Other | Attending: Emergency Medicine | Admitting: Emergency Medicine

## 2022-10-31 ENCOUNTER — Emergency Department: Payer: Medicare Other

## 2022-10-31 ENCOUNTER — Encounter: Payer: Self-pay | Admitting: Radiology

## 2022-10-31 ENCOUNTER — Other Ambulatory Visit: Payer: Medicare Other

## 2022-10-31 DIAGNOSIS — M5030 Other cervical disc degeneration, unspecified cervical region: Secondary | ICD-10-CM | POA: Diagnosis not present

## 2022-10-31 DIAGNOSIS — Z515 Encounter for palliative care: Secondary | ICD-10-CM

## 2022-10-31 DIAGNOSIS — R42 Dizziness and giddiness: Secondary | ICD-10-CM | POA: Diagnosis not present

## 2022-10-31 DIAGNOSIS — R911 Solitary pulmonary nodule: Secondary | ICD-10-CM | POA: Diagnosis not present

## 2022-10-31 DIAGNOSIS — F03918 Unspecified dementia, unspecified severity, with other behavioral disturbance: Secondary | ICD-10-CM

## 2022-10-31 DIAGNOSIS — W19XXXA Unspecified fall, initial encounter: Secondary | ICD-10-CM

## 2022-10-31 DIAGNOSIS — S0990XA Unspecified injury of head, initial encounter: Secondary | ICD-10-CM | POA: Diagnosis not present

## 2022-10-31 LAB — URINALYSIS, ROUTINE W REFLEX MICROSCOPIC
Bacteria, UA: NONE SEEN
Bilirubin Urine: NEGATIVE
Glucose, UA: NEGATIVE mg/dL
Hgb urine dipstick: NEGATIVE
Ketones, ur: 5 mg/dL — AB
Leukocytes,Ua: NEGATIVE
Nitrite: NEGATIVE
Protein, ur: 100 mg/dL — AB
Specific Gravity, Urine: 1.02 (ref 1.005–1.030)
pH: 5 (ref 5.0–8.0)

## 2022-10-31 LAB — BASIC METABOLIC PANEL
Anion gap: 7 (ref 5–15)
BUN: 40 mg/dL — ABNORMAL HIGH (ref 8–23)
CO2: 26 mmol/L (ref 22–32)
Calcium: 9.3 mg/dL (ref 8.9–10.3)
Chloride: 107 mmol/L (ref 98–111)
Creatinine, Ser: 1.18 mg/dL (ref 0.61–1.24)
GFR, Estimated: >60 mL/min (ref >60)
Glucose, Bld: 105 mg/dL — ABNORMAL HIGH (ref 70–99)
Potassium: 3.7 mmol/L (ref 3.5–5.1)
Sodium: 140 mmol/L (ref 135–145)

## 2022-10-31 LAB — CBC
HCT: 34.1 % — ABNORMAL LOW (ref 39.0–52.0)
Hemoglobin: 11.4 g/dL — ABNORMAL LOW (ref 13.0–17.0)
MCH: 33.7 pg (ref 26.0–34.0)
MCHC: 33.4 g/dL (ref 30.0–36.0)
MCV: 100.9 fL — ABNORMAL HIGH (ref 80.0–100.0)
Platelets: 153 10*3/uL (ref 150–400)
RBC: 3.38 MIL/uL — ABNORMAL LOW (ref 4.22–5.81)
RDW: 13.1 % (ref 11.5–15.5)
WBC: 7.3 10*3/uL (ref 4.0–10.5)
nRBC: 0 % (ref 0.0–0.2)

## 2022-10-31 LAB — TROPONIN I (HIGH SENSITIVITY)
Troponin I (High Sensitivity): 9 ng/L (ref <18)
Troponin I (High Sensitivity): 9 ng/L (ref <18)

## 2022-10-31 MED ORDER — ACETAMINOPHEN 500 MG PO TABS
1000.0000 mg | ORAL_TABLET | Freq: Once | ORAL | Status: AC
  Administered 2022-10-31: 1000 mg via ORAL
  Filled 2022-10-31: qty 2

## 2022-10-31 MED ORDER — METHOCARBAMOL 500 MG PO TABS
500.0000 mg | ORAL_TABLET | Freq: Once | ORAL | Status: AC
  Administered 2022-10-31: 500 mg via ORAL
  Filled 2022-10-31 (×2): qty 1

## 2022-10-31 MED ORDER — HALOPERIDOL 5 MG PO TABS
5.0000 mg | ORAL_TABLET | Freq: Once | ORAL | Status: AC
  Administered 2022-10-31: 5 mg via ORAL
  Filled 2022-10-31: qty 1

## 2022-10-31 MED ORDER — LIDOCAINE 5 % EX PTCH
1.0000 | MEDICATED_PATCH | CUTANEOUS | Status: DC
  Administered 2022-10-31: 1 via TRANSDERMAL
  Filled 2022-10-31: qty 1

## 2022-10-31 NOTE — Discharge Instructions (Signed)
Use Tylenol for pain and fevers.  Up to 1000 mg per dose, up to 4 times per day.  Do not take more than 4000 mg of Tylenol/acetaminophen within 24 hours..  

## 2022-10-31 NOTE — ED Provider Notes (Signed)
[facility] Provider Note    Event Date/Time   First MD Initiated Contact with Patient 10/31/22 0150     (approximate)   History   Fall   HPI  Zachary Hall is a 88 y.o. male who presents to the ED for evaluation of Fall   I review a PCP visit from 3/22.  History of dementia and stays at home with his wife, increasing deconditioning and frequent falls, awaiting an opening at [facility].  Wife brings patient into the ED for evaluation of another fall.  She reports that he has fallen at least 10 times in the past couple weeks.  Reports his dementia is worsening and he is becoming more difficult to handle at home.  She is renovating at [facility] [place], getting a hospital bed and other accommodations to try to keep him at home safely and appropriately.  Try not to get him into a SNF at this point but acknowledges that he is declining quickly.  They met with palliative care last week.  She reports hearing him fall in another room, she found him awake on the floor and had difficulty getting him up so she called 911.  No apparent syncope or recent illnesses.  No witnessed seizure-like activity  Patient is pleasantly disoriented and hard of hearing and does not provide much relevant history.  He reports anterior bilateral chest wall discomfort  Physical Exam   Triage Vital Signs: ED Triage Vitals  Enc Vitals Group     BP 10/30/22 2342 (!) 171/74     Pulse Rate 10/30/22 2342 (!) 54     Resp 10/30/22 2342 18     Temp 10/30/22 2342 98.3 F (36.8 C)     Temp Source 10/30/22 2342 Oral     SpO2 10/30/22 2342 98 %     Weight 10/30/22 2355 150 lb (68 kg)     Height 10/30/22 2355 5\' 10"  (1.778 m)     Head Circumference --      Peak Flow --      Pain Score 10/30/22 2355 0     Pain Loc --      Pain Edu? --      Excl. in [place]? --     Most recent vital signs: Vitals:   10/31/22 0300 10/31/22 0330  BP: (!) 201/86 (!) 206/80  Pulse: (!) 56 (!) 58  Resp:     Temp:    SpO2: 99% 99%    General: Awake, no distress.  Pleasantly disoriented CV:  Good peripheral perfusion.  Resp:  Normal effort.  Abd:  No distention.  Soft and benign MSK:  No deformity noted.  Neuro:  No focal deficits appreciated. Cranial nerves II through XII intact 5/5 strength and sensation in all 4 extremities Other:  Tenderness and reproducible chest discomfort on palpation anteriorly over the sternum and paramedian anterior chest.  No overlying skin changes, lacerations    ED Results / Procedures / Treatments   Labs (all labs ordered are listed, but only abnormal results are displayed) Labs Reviewed  BASIC METABOLIC PANEL - Abnormal; Notable for the following components:      Result Value   Glucose, Bld 105 (*)    BUN 40 (*)    All other components within normal limits  CBC - Abnormal; Notable for the following components:   RBC 3.38 (*)    Hemoglobin 11.4 (*)    HCT 34.1 (*)    MCV 100.9 (*)  All other components within normal limits  URINALYSIS, ROUTINE W REFLEX MICROSCOPIC - Abnormal; Notable for the following components:   Color, Urine YELLOW (*)    APPearance CLEAR (*)    Ketones, ur 5 (*)    Protein, ur 100 (*)    All other components within normal limits  TROPONIN I (HIGH SENSITIVITY)  TROPONIN I (HIGH SENSITIVITY)    EKG Sinus rhythm with a rate of 52 bpm.  Normal axis.  Right bundle and left anterior fascicular blocks.  No high-grade AV block.  No STEMI.  RADIOLOGY CT head interpreted by me without evidence of acute intracranial pathology CT cervical spine interpreted by me without evidence of fracture or dislocation CT chest without contrast without evidence of fracture or pneumothorax  Official radiology report(s): CT CHEST WO CONTRAST  Result Date: 10/31/2022 CLINICAL DATA:  Chest trauma, blunt.  Left back pain EXAM: CT CHEST WITHOUT CONTRAST TECHNIQUE: Multidetector CT imaging of the chest was performed following the standard protocol  without IV contrast. RADIATION DOSE REDUCTION: This exam was performed according to the departmental dose-optimization program which includes automated exposure control, adjustment of the mA and/or kV according to patient size and/or use of iterative reconstruction technique. COMPARISON:  CT abdomen pelvis 11/19/2020 FINDINGS: Cardiovascular: Coronary artery bypass grafting has been performed. Global cardiac size within normal limits. No pericardial effusion. Central pulmonary arteries are enlarged in keeping with changes of pulmonary arterial hypertension. Moderate atherosclerotic calcification within the thoracic aorta. Mild dilation of the ascending thoracic aorta which measures 4.1 cm in greatest dimension. Descending thoracic aorta is of normal caliber. Aberrant right subclavian artery. Mediastinum/Nodes: No enlarged mediastinal or axillary lymph nodes. Thyroid gland, trachea, and esophagus demonstrate no significant findings. Lungs/Pleura: 1.5 x 2.8 cm pulmonary mass has developed within the right posterior basal lower lobe within the costophrenic sulcus. Lungs are otherwise clear. No pneumothorax or pleural effusion. Upper Abdomen: No acute abnormality. Musculoskeletal: Osseous structures are age-appropriate. No acute bone abnormality. No lytic or blastic bone lesion. IMPRESSION: 1. No acute intrathoracic pathology identified. 2. Solid pulmonary nodule measuring 28 mm. Per Fleischner Society Guidelines, recommend a PET/CT or tissue sampling. These guidelines do not apply to immunocompromised patients and patients with cancer. Follow up in patients with significant comorbidities as clinically warranted. For lung cancer screening, adhere to Lung-RADS guidelines. Reference: Radiology. 2017; 284(1):228-43. 3. Morphologic changes in keeping with pulmonary arterial hypertension. 4. 4.1 cm ascending thoracic aortic aneurysm. Recommend annual imaging followup by CTA or MRA. This recommendation follows 2010  ACCF/AHA/AATS/ACR/ASA/SCA/SCAI/SIR/STS/SVM Guidelines for the Diagnosis and Management of Patients with Thoracic Aortic Disease. Circulation. 2010; 121JN:9224643. Aortic aneurysm NOS (ICD10-I71.9) Aortic Atherosclerosis (ICD10-I70.0). Aortic aneurysm NOS (ICD10-I71.9). Electronically Signed   By: Fidela Salisbury M.D.   On: 10/31/2022 01:36   CT Head Wo Contrast  Result Date: 10/31/2022 CLINICAL DATA:  Head trauma, intracranial venous injury suspected EXAM: CT HEAD WITHOUT CONTRAST CT CERVICAL SPINE WITHOUT CONTRAST TECHNIQUE: Multidetector CT imaging of the head and cervical spine was performed following the standard protocol without intravenous contrast. Multiplanar CT image reconstructions of the cervical spine were also generated. RADIATION DOSE REDUCTION: This exam was performed according to the departmental dose-optimization program which includes automated exposure control, adjustment of the mA and/or kV according to patient size and/or use of iterative reconstruction technique. COMPARISON:  None Available. FINDINGS: CT HEAD FINDINGS Brain: Normal anatomic configuration. Parenchymal volume loss is commensurate with the patient's age. Stable moderate periventricular white matter changes are present likely reflecting the sequela of small  vessel ischemia. No abnormal intra or extra-axial mass lesion or fluid collection. No abnormal mass effect or midline shift. No evidence of acute intracranial hemorrhage or infarct. Ventricular size is commensurate with the degree of parenchymal volume loss. Cerebellum unremarkable. Vascular: No asymmetric hyperdense vasculature at the skull base. Skull: Intact Sinuses/Orbits: Extensive paranasal sinus surgery has been performed with bilateral ethmoidectomy, sphenoid ectomy, middle turbinectomy, and maxillary antrectomy. Orbits are unremarkable. Other: Mastoid air cells and middle ear cavities are clear. CT CERVICAL SPINE FINDINGS Alignment: Normal. Skull base and vertebrae:  No acute fracture. No primary bone lesion or focal pathologic process. Soft tissues and spinal canal: No prevertebral fluid or swelling. No visible canal hematoma. Extensive atherosclerotic calcification within the carotid bifurcations bilaterally. Disc levels: Intervertebral disc space narrowing and endplate remodeling throughout the cervical spine is in keeping with changes of diffuse moderate to severe degenerative disc disease. Prevertebral soft tissues are not thickened on sagittal reformats. Posterior disc herniations at C3-4, C4-5, C5-6, and C6-7 result in mild central canal stenosis, most severe at C4-5. Multilevel uncovertebral and facet arthrosis results in multilevel moderate neuroforaminal narrowing, most severe bilaterally at C3-C6 Upper chest: Negative. Other: None IMPRESSION: 1. No acute intracranial abnormality. No calvarial fracture. 2. Stable senescent change. 3. No acute fracture or listhesis of the cervical spine. 4. Advanced multilevel degenerative disc and degenerative joint disease resulting in multilevel mild central canal stenosis and moderate bilateral neuroforaminal narrowing, most severe at C3-C6. 5. Extensive atherosclerotic calcification within the carotid bifurcations bilaterally. Electronically Signed   By: Fidela Salisbury M.D.   On: 10/31/2022 01:20   CT Cervical Spine Wo Contrast  Result Date: 10/31/2022 CLINICAL DATA:  Head trauma, intracranial venous injury suspected EXAM: CT HEAD WITHOUT CONTRAST CT CERVICAL SPINE WITHOUT CONTRAST TECHNIQUE: Multidetector CT imaging of the head and cervical spine was performed following the standard protocol without intravenous contrast. Multiplanar CT image reconstructions of the cervical spine were also generated. RADIATION DOSE REDUCTION: This exam was performed according to the departmental dose-optimization program which includes automated exposure control, adjustment of the mA and/or kV according to patient size and/or use of iterative  reconstruction technique. COMPARISON:  None Available. FINDINGS: CT HEAD FINDINGS Brain: Normal anatomic configuration. Parenchymal volume loss is commensurate with the patient's age. Stable moderate periventricular white matter changes are present likely reflecting the sequela of small vessel ischemia. No abnormal intra or extra-axial mass lesion or fluid collection. No abnormal mass effect or midline shift. No evidence of acute intracranial hemorrhage or infarct. Ventricular size is commensurate with the degree of parenchymal volume loss. Cerebellum unremarkable. Vascular: No asymmetric hyperdense vasculature at the skull base. Skull: Intact Sinuses/Orbits: Extensive paranasal sinus surgery has been performed with bilateral ethmoidectomy, sphenoid ectomy, middle turbinectomy, and maxillary antrectomy. Orbits are unremarkable. Other: Mastoid air cells and middle ear cavities are clear. CT CERVICAL SPINE FINDINGS Alignment: Normal. Skull base and vertebrae: No acute fracture. No primary bone lesion or focal pathologic process. Soft tissues and spinal canal: No prevertebral fluid or swelling. No visible canal hematoma. Extensive atherosclerotic calcification within the carotid bifurcations bilaterally. Disc levels: Intervertebral disc space narrowing and endplate remodeling throughout the cervical spine is in keeping with changes of diffuse moderate to severe degenerative disc disease. Prevertebral soft tissues are not thickened on sagittal reformats. Posterior disc herniations at C3-4, C4-5, C5-6, and C6-7 result in mild central canal stenosis, most severe at C4-5. Multilevel uncovertebral and facet arthrosis results in multilevel moderate neuroforaminal narrowing, most severe bilaterally at C3-C6 Upper chest: Negative.  Other: None IMPRESSION: 1. No acute intracranial abnormality. No calvarial fracture. 2. Stable senescent change. 3. No acute fracture or listhesis of the cervical spine. 4. Advanced multilevel  degenerative disc and degenerative joint disease resulting in multilevel mild central canal stenosis and moderate bilateral neuroforaminal narrowing, most severe at C3-C6. 5. Extensive atherosclerotic calcification within the carotid bifurcations bilaterally. Electronically Signed   By: Fidela Salisbury M.D.   On: 10/31/2022 01:20   DG Chest Port 1 View  Result Date: 10/31/2022 CLINICAL DATA:  Dizziness. EXAM: PORTABLE CHEST 1 VIEW COMPARISON:  Chest radiograph dated 09/28/2022. FINDINGS: Faint lucency along the right lateral pleural surface may be artifactual and related to skin fold. A pneumothorax is less likely but not excluded. Clinical correlation and repeat radiograph after repositioning of the patient recommended. No focal consolidation, or pleural effusion. Top-normal cardiac size. Median sternotomy wires and CABG vascular clips. No acute osseous pathology. IMPRESSION: Skin fold artifact versus less likely a right-sided pneumothorax. Electronically Signed   By: Anner Crete M.D.   On: 10/31/2022 00:45    PROCEDURES and INTERVENTIONS:  .1-3 Lead EKG Interpretation  Performed by: Vladimir Crofts, MD Authorized by: Vladimir Crofts, MD     Interpretation: normal     ECG rate:  60   ECG rate assessment: normal     Rhythm: sinus rhythm     Ectopy: none     Conduction: normal     Medications  lidocaine (LIDODERM) 5 % 1 patch (1 patch Transdermal Patch Applied 10/31/22 0306)  acetaminophen (TYLENOL) tablet 1,000 mg (1,000 mg Oral Given 10/31/22 0306)  methocarbamol (ROBAXIN) tablet 500 mg (500 mg Oral Given 10/31/22 0306)  haloperidol (HALDOL) tablet 5 mg (5 mg Oral Given 10/31/22 0353)     IMPRESSION / MDM / ASSESSMENT AND PLAN / ED COURSE  I reviewed the triage vital signs and the nursing notes.  Differential diagnosis includes, but is not limited to, rib fracture, sternal fracture, pneumothorax, ACS  {Patient presents with symptoms of an acute illness or injury that is potentially  life-threatening.  Pleasantly disoriented patient presents after another fall without evidence of acute derangements and suitable for return home with his wife.  He complains of chest wall discomfort after the fall and has a nonischemic EKG and negative troponins.  CT chest without contrast without fracture or pneumothorax.  CT head and neck are also reassuring.  Urine with small ketones suggestive of mild dehydration, for which she receives oral rehydration.  No leukocytosis or metabolic derangements acutely.  Reassuring imaging workup and his wife is happy to take him home.  Suitable for outpatient management      FINAL CLINICAL IMPRESSION(S) / ED DIAGNOSES   Final diagnoses:  Fall, initial encounter  Dementia with other behavioral disturbance, unspecified dementia severity, unspecified dementia type     Rx / DC Orders   ED Discharge Orders     None        Note:  This document was prepared using Dragon voice recognition software and may include unintentional dictation errors.   Vladimir Crofts, MD 10/31/22 [phone]

## 2022-10-31 NOTE — Progress Notes (Signed)
TELEPHONE ENCOUNTER  Spouse called in to inform PC that patient had a fall and was sent to ER this morning. Patient has since returned home. No major injuries sustained, other than soreness to ribs.   Spouse expressed ongoing fatigue around caring patient with his advancing dementia. Spouse share that patient is becoming more irritable and needy of her. Spouse unsure on what to do to cope with this changes in patient. Spouse is unsure if placement will be a good option due to the financial aspect of paying privately. SW advised spouse to consider looking hiring caregivers again to assist with the care needs of patient. Spouse shared that she will look into tis and she would prefer a male caregiver if possible, due to patient being a fall risk. Spouse also expressed concern around patient potentially leaving the home and spouse being unaware. SW advised spouse consider door alarms to alert her whenever a door is open. Spouse stated that she will look into this.   Plan: PC to keep 4/22 appt unless otherwise needed to change.

## 2022-10-31 NOTE — ED Notes (Signed)
Pt A&O x4, no obvious distress noted, respirations regular/unlabored. Pt verbalizes understanding of discharge instructions. Pt able to ambulate from ED independently.   

## 2022-11-02 ENCOUNTER — Telehealth: Payer: Self-pay | Admitting: Cardiology

## 2022-11-02 NOTE — Telephone Encounter (Signed)
Patient wife concerned of patient falls.  She states he falls at least once a week.  He was taken to ED for fall and test were run all negative.  She states he has all the labs and test each time, but nothing found.  She states he says he is dizzy more when he stands up.  She does not have any BP readings from home, only hospital visits which BP fluctuates from very high to normal.  Pulse is low more often.  She will try to take orthostatic if he will allow, but he gets aggrivated with her at times so unsure if she can. She states they have stopped his Lexapro as it made his behavior worse.  He is taking Namenda 10mg  BID now.  She gives him his Losartan at bedtime because she thinks this might cause the dizziness. She has looked into placement in a home but wants to try to keep him at home.  She does have a caregiver coming today to see him.  Palliative still comes as well.  Most falls she calls the neighbors to help pick him up but last two she called 911 and he was taken to ED. She is just concerned as what she should do.  We did discuss sundowners and reducing stimulants at around 3pm each day to help with sundowners.  Wife should take a break when caregiver is in the house. Patient confusion will cause him to walk even with dizziness because not understanding risk of falls.

## 2022-11-02 NOTE — Telephone Encounter (Signed)
STAT if patient feels like he/she is going to faint   Are you dizzy now? No, only when he gets up he gets dizzy and falls down   Do you feel faint or have you passed out? no  Do you have any other symptoms? No   Have you checked your HR and BP (record if available)?  Has not recorded. She states they do have pallative care but they aren't any help. Please advise.

## 2022-11-02 NOTE — Telephone Encounter (Signed)
Spoke with wife and she stated she has a nurse coming tomorrow and she will have her get orthostatic blood pressures. She will give [place] a call with the readings.

## 2022-11-03 ENCOUNTER — Telehealth: Payer: Self-pay

## 2022-11-03 NOTE — Telephone Encounter (Signed)
     Patient  visit on 4/2 [place]  Have you been able to follow up with your primary care physician? Yes   The patient was or was not able to obtain any needed medicine or equipment. Yes   Are there diet recommendations that you are having difficulty following? Na   Patient expresses understanding of discharge instructions and education provided has no other needs at this time.  Yes      Lenard Forth [facility] Guide, [facility] [phone] [address] [place], [place], [place] [address] Phone: [phone] Email: [email or web address]

## 2022-11-03 NOTE — Telephone Encounter (Signed)
        Patient  visited  on 4/2     Telephone encounter attempt :  1st  A HIPAA compliant voice message was left requesting a return call.  Instructed patient to call back     Zachary Hall [facility] Guide, [place] [phone] [address], [place], [place] [address] Phone: [phone] Email: [email or web address]       

## 2022-11-06 ENCOUNTER — Telehealth: Payer: Self-pay

## 2022-11-06 NOTE — Telephone Encounter (Signed)
Palliative care SW returned patients spouse TC.   VM full. Unable to LVM.

## 2022-11-07 ENCOUNTER — Other Ambulatory Visit: Payer: Medicare Other

## 2022-11-07 DIAGNOSIS — Z515 Encounter for palliative care: Secondary | ICD-10-CM

## 2022-11-08 DIAGNOSIS — R404 Transient alteration of awareness: Secondary | ICD-10-CM | POA: Diagnosis not present

## 2022-11-08 DIAGNOSIS — Z743 Need for continuous supervision: Secondary | ICD-10-CM | POA: Diagnosis not present

## 2022-11-08 DIAGNOSIS — R0689 Other abnormalities of breathing: Secondary | ICD-10-CM | POA: Diagnosis not present

## 2022-11-08 DIAGNOSIS — S0990XA Unspecified injury of head, initial encounter: Secondary | ICD-10-CM | POA: Diagnosis not present

## 2022-11-08 DIAGNOSIS — R6889 Other general symptoms and signs: Secondary | ICD-10-CM | POA: Diagnosis not present

## 2022-11-08 NOTE — Progress Notes (Signed)
PATIENT NAME: RAEQWON MCCULLAR DOB: 25-Jun-1935 MRN: [id number]  PRIMARY CARE PROVIDER: Elenore Paddy, NP  RESPONSIBLE PARTY:  Acct ID - Guarantor Home Phone Work Phone Relationship Acct Type  [id number] RONDALE, WASSENBER[place]* [phone]  Self P/F     [address] [place], [place] [address]   I connected with Gershon Cull wife of HAWKEN BARTHOLF on 11/08/22 by phone and verified that I am speaking with the correct person using two identifiers.   I discussed the limitations of evaluation and management by telemedicine. The patient expressed understanding and agreed to proceed.   Return call made to wife to follow up on ED visit, falls and bowel status.  Wife advised patient had 3 falls last week with 1 ED visit.  She has required assistance of her neighbor to assist patient from falls.  Patient was given ducolax over the weekend to aide in bowel movement.  This was successful however patient has been having diarrhea this week.  She notes stool has blood tinge coloring at times.  We discussed risk for dehydration if diarrhea continues. Discussed ED visit if needed.  Wife advised she has a PCP visit on Thursday and will address concerns at that time.   She has concerns over worsening sundowning that starts at 430 almost daily.   Wife advised she is working with another nurse to address some of her concerns.  I discussed private caregivers to assist as wife continues to report fatigue in being the primary caregiver for patient.  Wife is looking at [facility] but has not yet contacted them.  She may have a retired Engineer, civil (consulting) who can assist on a PRN basis.  She is also considering the [facility] but has not yet contacted them.   Wife voiced appreciation for return call but advised she wanted to follow back up with a private nurse that has been assisting her.     Truitt Merle, RN

## 2022-11-09 ENCOUNTER — Inpatient Hospital Stay
Admission: EM | Admit: 2022-11-09 | Discharge: 2022-11-14 | DRG: 308 | Disposition: A | Discharge status: Skilled Nursing Facility | Payer: Medicare Other | Attending: Student | Admitting: Student

## 2022-11-09 ENCOUNTER — Other Ambulatory Visit: Payer: Self-pay

## 2022-11-09 ENCOUNTER — Emergency Department: Payer: Medicare Other

## 2022-11-09 ENCOUNTER — Ambulatory Visit: Payer: Medicare Other | Admitting: Nurse Practitioner

## 2022-11-09 ENCOUNTER — Encounter: Payer: Self-pay | Admitting: Internal Medicine

## 2022-11-09 DIAGNOSIS — Z66 Do not resuscitate: Secondary | ICD-10-CM | POA: Diagnosis present

## 2022-11-09 DIAGNOSIS — R296 Repeated falls: Secondary | ICD-10-CM | POA: Diagnosis not present

## 2022-11-09 DIAGNOSIS — Z6822 Body mass index (BMI) 22.0-22.9, adult: Secondary | ICD-10-CM

## 2022-11-09 DIAGNOSIS — I2510 Atherosclerotic heart disease of native coronary artery without angina pectoris: Secondary | ICD-10-CM | POA: Diagnosis not present

## 2022-11-09 DIAGNOSIS — H1089 Other conjunctivitis: Secondary | ICD-10-CM | POA: Diagnosis present

## 2022-11-09 DIAGNOSIS — E7800 Pure hypercholesterolemia, unspecified: Secondary | ICD-10-CM | POA: Diagnosis not present

## 2022-11-09 DIAGNOSIS — I4510 Unspecified right bundle-branch block: Secondary | ICD-10-CM | POA: Diagnosis present

## 2022-11-09 DIAGNOSIS — Z1152 Encounter for screening for COVID-19: Secondary | ICD-10-CM

## 2022-11-09 DIAGNOSIS — I25708 Atherosclerosis of coronary artery bypass graft(s), unspecified, with other forms of angina pectoris: Secondary | ICD-10-CM | POA: Diagnosis present

## 2022-11-09 DIAGNOSIS — Z85820 Personal history of malignant melanoma of skin: Secondary | ICD-10-CM

## 2022-11-09 DIAGNOSIS — M109 Gout, unspecified: Secondary | ICD-10-CM | POA: Diagnosis present

## 2022-11-09 DIAGNOSIS — R339 Retention of urine, unspecified: Secondary | ICD-10-CM | POA: Diagnosis not present

## 2022-11-09 DIAGNOSIS — G4700 Insomnia, unspecified: Secondary | ICD-10-CM | POA: Diagnosis not present

## 2022-11-09 DIAGNOSIS — R001 Bradycardia, unspecified: Principal | ICD-10-CM | POA: Diagnosis present

## 2022-11-09 DIAGNOSIS — I452 Bifascicular block: Secondary | ICD-10-CM | POA: Diagnosis not present

## 2022-11-09 DIAGNOSIS — K219 Gastro-esophageal reflux disease without esophagitis: Secondary | ICD-10-CM | POA: Diagnosis present

## 2022-11-09 DIAGNOSIS — F05 Delirium due to known physiological condition: Secondary | ICD-10-CM | POA: Diagnosis present

## 2022-11-09 DIAGNOSIS — D649 Anemia, unspecified: Secondary | ICD-10-CM | POA: Diagnosis present

## 2022-11-09 DIAGNOSIS — I10 Essential (primary) hypertension: Secondary | ICD-10-CM | POA: Diagnosis not present

## 2022-11-09 DIAGNOSIS — S199XXA Unspecified injury of neck, initial encounter: Secondary | ICD-10-CM | POA: Diagnosis not present

## 2022-11-09 DIAGNOSIS — G20A1 Parkinson's disease without dyskinesia, without mention of fluctuations: Secondary | ICD-10-CM | POA: Diagnosis present

## 2022-11-09 DIAGNOSIS — R55 Syncope and collapse: Secondary | ICD-10-CM | POA: Diagnosis not present

## 2022-11-09 DIAGNOSIS — Z87891 Personal history of nicotine dependence: Secondary | ICD-10-CM

## 2022-11-09 DIAGNOSIS — W19XXXA Unspecified fall, initial encounter: Principal | ICD-10-CM

## 2022-11-09 DIAGNOSIS — R531 Weakness: Secondary | ICD-10-CM | POA: Diagnosis not present

## 2022-11-09 DIAGNOSIS — F0280 Dementia in other diseases classified elsewhere without behavioral disturbance: Secondary | ICD-10-CM | POA: Diagnosis present

## 2022-11-09 DIAGNOSIS — I480 Paroxysmal atrial fibrillation: Secondary | ICD-10-CM | POA: Diagnosis present

## 2022-11-09 DIAGNOSIS — Z9181 History of falling: Secondary | ICD-10-CM | POA: Diagnosis not present

## 2022-11-09 DIAGNOSIS — I160 Hypertensive urgency: Secondary | ICD-10-CM | POA: Insufficient documentation

## 2022-11-09 DIAGNOSIS — Z85528 Personal history of other malignant neoplasm of kidney: Secondary | ICD-10-CM

## 2022-11-09 DIAGNOSIS — Z905 Acquired absence of kidney: Secondary | ICD-10-CM

## 2022-11-09 DIAGNOSIS — Z79899 Other long term (current) drug therapy: Secondary | ICD-10-CM

## 2022-11-09 DIAGNOSIS — Z8679 Personal history of other diseases of the circulatory system: Secondary | ICD-10-CM

## 2022-11-09 DIAGNOSIS — Z043 Encounter for examination and observation following other accident: Secondary | ICD-10-CM | POA: Diagnosis not present

## 2022-11-09 DIAGNOSIS — E43 Unspecified severe protein-calorie malnutrition: Secondary | ICD-10-CM | POA: Diagnosis not present

## 2022-11-09 DIAGNOSIS — F0390 Unspecified dementia without behavioral disturbance: Secondary | ICD-10-CM | POA: Diagnosis present

## 2022-11-09 LAB — URINALYSIS, ROUTINE W REFLEX MICROSCOPIC
Bacteria, UA: NONE SEEN
Bilirubin Urine: NEGATIVE
Glucose, UA: NEGATIVE mg/dL
Hgb urine dipstick: NEGATIVE
Ketones, ur: 5 mg/dL — AB
Leukocytes,Ua: NEGATIVE
Nitrite: NEGATIVE
Protein, ur: 100 mg/dL — AB
Specific Gravity, Urine: 1.02 (ref 1.005–1.030)
Squamous Epithelial / HPF: NONE SEEN /HPF (ref 0–5)
pH: 5 (ref 5.0–8.0)

## 2022-11-09 LAB — COMPREHENSIVE METABOLIC PANEL
ALT: 13 U/L (ref 0–44)
AST: 18 U/L (ref 15–41)
Albumin: 3.9 g/dL (ref 3.5–5.0)
Alkaline Phosphatase: 59 U/L (ref 38–126)
Anion gap: 9 (ref 5–15)
BUN: 32 mg/dL — ABNORMAL HIGH (ref 8–23)
CO2: 22 mmol/L (ref 22–32)
Calcium: 8.8 mg/dL — ABNORMAL LOW (ref 8.9–10.3)
Chloride: 108 mmol/L (ref 98–111)
Creatinine, Ser: 1.1 mg/dL (ref 0.61–1.24)
GFR, Estimated: >60 mL/min (ref >60)
Glucose, Bld: 107 mg/dL — ABNORMAL HIGH (ref 70–99)
Potassium: 3.6 mmol/L (ref 3.5–5.1)
Sodium: 139 mmol/L (ref 135–145)
Total Bilirubin: 0.8 mg/dL (ref 0.3–1.2)
Total Protein: 6.3 g/dL — ABNORMAL LOW (ref 6.5–8.1)

## 2022-11-09 LAB — CBC WITH DIFFERENTIAL/PLATELET
Abs Immature Granulocytes: 0.02 10*3/uL (ref 0.00–0.07)
Basophils Absolute: 0 10*3/uL (ref 0.0–0.1)
Basophils Relative: 0 %
Eosinophils Absolute: 0.1 10*3/uL (ref 0.0–0.5)
Eosinophils Relative: 1 %
HCT: 33.9 % — ABNORMAL LOW (ref 39.0–52.0)
Hemoglobin: 11.2 g/dL — ABNORMAL LOW (ref 13.0–17.0)
Immature Granulocytes: 0 %
Lymphocytes Relative: 19 %
Lymphs Abs: 1.9 10*3/uL (ref 0.7–4.0)
MCH: 33.5 pg (ref 26.0–34.0)
MCHC: 33 g/dL (ref 30.0–36.0)
MCV: 101.5 fL — ABNORMAL HIGH (ref 80.0–100.0)
Monocytes Absolute: 0.8 10*3/uL (ref 0.1–1.0)
Monocytes Relative: 7 %
Neutro Abs: 7.4 10*3/uL (ref 1.7–7.7)
Neutrophils Relative %: 73 %
Platelets: 155 10*3/uL (ref 150–400)
RBC: 3.34 MIL/uL — ABNORMAL LOW (ref 4.22–5.81)
RDW: 13.1 % (ref 11.5–15.5)
WBC: 10.1 10*3/uL (ref 4.0–10.5)
nRBC: 0 % (ref 0.0–0.2)

## 2022-11-09 LAB — RESP PANEL BY RT-PCR (RSV, FLU A&B, COVID)  RVPGX2
Influenza A by PCR: NEGATIVE
Influenza B by PCR: NEGATIVE
Resp Syncytial Virus by PCR: NEGATIVE
SARS Coronavirus 2 by RT PCR: NEGATIVE

## 2022-11-09 LAB — TROPONIN I (HIGH SENSITIVITY)
Troponin I (High Sensitivity): 11 ng/L (ref <18)
Troponin I (High Sensitivity): 14 ng/L (ref <18)

## 2022-11-09 LAB — CK: Total CK: 86 U/L (ref 49–397)

## 2022-11-09 LAB — MAGNESIUM: Magnesium: 2 mg/dL (ref 1.7–2.4)

## 2022-11-09 LAB — CBG MONITORING, ED: Glucose-Capillary: 94 mg/dL (ref 70–99)

## 2022-11-09 MED ORDER — ONDANSETRON HCL 4 MG PO TABS: 4.0000 mg | ORAL_TABLET | Freq: Four times a day (QID) | ORAL | Status: DC | PRN

## 2022-11-09 MED ORDER — MELATONIN 5 MG PO TABS
5.0000 mg | ORAL_TABLET | Freq: Every evening | ORAL | Status: DC | PRN
  Administered 2022-11-10: 5 mg via ORAL
  Filled 2022-11-09: qty 1

## 2022-11-09 MED ORDER — SODIUM CHLORIDE 0.9% FLUSH
3.0000 mL | Freq: Two times a day (BID) | INTRAVENOUS | Status: DC
  Administered 2022-11-10 – 2022-11-14 (×9): 3 mL via INTRAVENOUS

## 2022-11-09 MED ORDER — QUETIAPINE FUMARATE 25 MG PO TABS
25.0000 mg | ORAL_TABLET | Freq: Two times a day (BID) | ORAL | Status: DC
  Administered 2022-11-09 – 2022-11-10 (×3): 25 mg via ORAL
  Filled 2022-11-09 (×3): qty 1

## 2022-11-09 MED ORDER — SODIUM CHLORIDE 0.9 % IV BOLUS
500.0000 mL | Freq: Once | INTRAVENOUS | Status: AC
  Administered 2022-11-09: 500 mL via INTRAVENOUS

## 2022-11-09 MED ORDER — PANTOPRAZOLE SODIUM 40 MG PO TBEC
40.0000 mg | DELAYED_RELEASE_TABLET | Freq: Every day | ORAL | Status: DC
  Administered 2022-11-09 – 2022-11-14 (×6): 40 mg via ORAL
  Filled 2022-11-09 (×6): qty 1

## 2022-11-09 MED ORDER — HYDRALAZINE HCL 20 MG/ML IJ SOLN
5.0000 mg | Freq: Once | INTRAMUSCULAR | Status: AC
  Administered 2022-11-09: 5 mg via INTRAVENOUS
  Filled 2022-11-09: qty 1

## 2022-11-09 MED ORDER — ACETAMINOPHEN 325 MG PO TABS: 650.0000 mg | ORAL_TABLET | Freq: Four times a day (QID) | ORAL | Status: DC | PRN

## 2022-11-09 MED ORDER — LOSARTAN POTASSIUM 50 MG PO TABS
50.0000 mg | ORAL_TABLET | Freq: Every day | ORAL | Status: DC
  Administered 2022-11-09 – 2022-11-14 (×6): 50 mg via ORAL
  Filled 2022-11-09 (×6): qty 1

## 2022-11-09 MED ORDER — HYDRALAZINE HCL 20 MG/ML IJ SOLN
5.0000 mg | Freq: Four times a day (QID) | INTRAMUSCULAR | Status: DC | PRN
  Administered 2022-11-09: 5 mg via INTRAVENOUS
  Filled 2022-11-09 (×2): qty 1

## 2022-11-09 MED ORDER — NITROGLYCERIN 0.4 MG SL SUBL: 0.4000 mg | SUBLINGUAL_TABLET | SUBLINGUAL | Status: DC | PRN

## 2022-11-09 MED ORDER — ACETAMINOPHEN 650 MG RE SUPP: 650.0000 mg | Freq: Four times a day (QID) | RECTAL | Status: DC | PRN

## 2022-11-09 MED ORDER — ENOXAPARIN SODIUM 40 MG/0.4ML IJ SOSY
40.0000 mg | PREFILLED_SYRINGE | INTRAMUSCULAR | Status: DC
  Administered 2022-11-09 – 2022-11-14 (×6): 40 mg via SUBCUTANEOUS
  Filled 2022-11-09 (×6): qty 0.4

## 2022-11-09 MED ORDER — HYDROCODONE-ACETAMINOPHEN 5-325 MG PO TABS: 1.0000 | ORAL_TABLET | ORAL | Status: DC | PRN

## 2022-11-09 MED ORDER — AMLODIPINE BESYLATE 10 MG PO TABS
10.0000 mg | ORAL_TABLET | Freq: Every day | ORAL | Status: DC
  Administered 2022-11-09 – 2022-11-14 (×6): 10 mg via ORAL
  Filled 2022-11-09 (×5): qty 1
  Filled 2022-11-09: qty 2

## 2022-11-09 MED ORDER — CARBIDOPA-LEVODOPA 10-100 MG PO TABS
1.0000 | ORAL_TABLET | Freq: Three times a day (TID) | ORAL | Status: DC
  Administered 2022-11-09 – 2022-11-14 (×14): 1 via ORAL
  Filled 2022-11-09 (×16): qty 1

## 2022-11-09 MED ORDER — ONDANSETRON HCL 4 MG/2ML IJ SOLN: 4.0000 mg | Freq: Four times a day (QID) | INTRAMUSCULAR | Status: DC | PRN

## 2022-11-09 MED ORDER — ATORVASTATIN CALCIUM 10 MG PO TABS
5.0000 mg | ORAL_TABLET | Freq: Every day | ORAL | Status: DC
  Administered 2022-11-09 – 2022-11-13 (×5): 5 mg via ORAL
  Filled 2022-11-09: qty 1
  Filled 2022-11-09: qty 0.5
  Filled 2022-11-09 (×3): qty 1

## 2022-11-09 MED ORDER — DICLOFENAC SODIUM 1 % EX GEL
2.0000 g | Freq: Four times a day (QID) | CUTANEOUS | Status: DC
  Administered 2022-11-10 – 2022-11-14 (×14): 2 g via TOPICAL
  Filled 2022-11-09: qty 100

## 2022-11-09 NOTE — ED Notes (Addendum)
Patient's BP value was 209/103. After readjusting the BP cuff and repositioning patient's arm, BP value was 176/74. Notified Manuela Schwartz, NP

## 2022-11-09 NOTE — Assessment & Plan Note (Signed)
SBP over 200 on arrival, improving after IV hydralazine 5 mg suspect in part related to pain Continue home antihypertensives of losartan and amlodipine

## 2022-11-09 NOTE — Assessment & Plan Note (Addendum)
Troponin was negative at 11 and EKG showed no ischemic changes Continue atorvastatin 5 mg, losartan and nitroglycerin sublingual as needed

## 2022-11-09 NOTE — ED Notes (Addendum)
RN removed bed pan. No Bowel movement noted. Family at bedside

## 2022-11-09 NOTE — ED Notes (Signed)
Patient had full linen changed, cleaned perineum and butt from stool, clean chucks and brief placed.

## 2022-11-09 NOTE — Assessment & Plan Note (Signed)
Not currently on systemic anticoagulation no rate control agents Continue telemetry

## 2022-11-09 NOTE — Assessment & Plan Note (Signed)
Sinus bradycardia with RBBB No evidence of injury on imaging studies Workup unrevealing Some concern for symptomatic bradycardia as etiology of falls Continuous cardiac monitoring, echocardiogram Received a 1 L NS bolus in the ED Neurologic checks fall and aspiration precautions Avoid AV nodal agents for BP control

## 2022-11-09 NOTE — H&P (Addendum)
History and Physical    Patient: Zachary Hall ZOX:[id number] DOB: 03-16-1935 DOA: 11/09/2022 DOS: the patient was seen and examined on 11/09/2022 PCP: Elenore Paddy, NP  Patient coming from: Home  Chief Complaint:  Chief Complaint  Patient presents with   Fall    HPI: Zachary Hall is a 88 y.o. male with medical history significant for CAD s/p CABG, dementia, left renal carcinoma s/p partial left nephrectomy 01/2021, RBBB with sinus bradycardia and no indication for pacemaker (per review of cardiology note from 11/02/2022), remote history of paroxysmal A-fib, who was brought to the ED after a fall in the bathtub.  Wife at the bedside who gives most of the history says she heard a thud and when she rushed to the bathroom found him halfway in, halfway out of bathtub.  He was awake and alert and did not appear to have injuries.  Patient had fallen 3 times the day prior.  Wife is concerned that in the previous days he was constipated and she gave him a laxative and he had a few episodes of loose stool to where she had to place him in a diaper.  Otherwise he had been in his usual state of health. ED course and data review: BP 216/79, with bradycardia at 254 and otherwise normal vitals.  Labs unremarkable except for baseline anemia of 11.2.  CK normal at 86. CT head and C-spine nonacute.Clear  Chest x-ray and pelvic x-ray nonacute. EKG, personally viewed and interpreted shows sinus bradycardia at 60 with RBBB and LAFB.  Patient was treated with a 500 mL sodium chloride bolus, given hydralazine 5 mg IV for BP with improvement 158/87 by admission. Hospitalist consulted for admission.     Past Medical History:  Diagnosis Date   Arthritis    Balance problem    Barrett esophagus    Bradycardia    Coronary artery disease    GERD (gastroesophageal reflux disease)    Gout    History of atrial fibrillation    Previously on amiodarone   Hx of CABG    Hypercholesterolemia    Hypertension    Left  renal mass    Melanoma    Left shoulder blade   Nocturia    Right epiretinal membrane 02/10/2020   Past Surgical History:  Procedure Laterality Date   CARDIAC CATHETERIZATION  01/04/2010   COLONOSCOPY     CORONARY ARTERY BYPASS GRAFT  02/07/2010   LIMA to LAD, SVG to 2nd DX, SVG to OM   EYE SURGERY Bilateral    Cataract Extraction   HEMORRHOID SURGERY     x 2   HEMORRHOID SURGERY N/A 11/13/2012   Procedure: PPH Hemorrhoidectomy;  Surgeon: Robyne Askew, MD;  Location: [facility] OR;  Service: General;  Laterality: N/A;   INGUINAL HERNIA REPAIR Right 01/22/2017   Procedure: RIGHT INGUINAL HERNIA REPAIR WITH MESH;  Surgeon: Darnell Level, MD;  Location: [facility] OR;  Service: General;  Laterality: Right;   INSERTION OF MESH Right 01/22/2017   Procedure: INSERTION OF MESH;  Surgeon: Darnell Level, MD;  Location: [facility] OR;  Service: General;  Laterality: Right;   IR RADIOLOGIST EVAL & MGMT  01/06/2021   NASAL POLYP EXCISION     OTHER SURGICAL HISTORY  06/05/2002   Penile Implant   ROBOTIC ASSITED PARTIAL NEPHRECTOMY Left 02/25/2021   Procedure: XI ROBOTIC ASSITED PARTIAL NEPHRECTOMY;  Surgeon: Rene Paci, MD;  Location: [facility] ORS;  Service: Urology;  Laterality: Left;   UPPER GI ENDOSCOPY  Social History:  reports that he quit smoking about 61 years ago. His smoking use included cigarettes. He has quit using smokeless tobacco. He reports current alcohol use of about 4.0 standard drinks of alcohol per week. He reports that he does not use drugs.  Allergies  Allergen Reactions   Lexapro [Escitalopram]     Confusion, weakness   Namenda [Memantine]     Insomnia   Lisinopril Other (See Comments)    Cough    Family History  Problem Relation Age of Onset   Alcohol abuse Father    Colon cancer Mother    Cancer Mother        colon and bone   Heart attack Neg Hx    Hypertension Neg Hx    Stroke Neg Hx     Prior to Admission medications   Medication Sig Start Date End Date Taking?  Authorizing Provider  amLODipine (NORVASC) 10 MG tablet TAKE 1 TABLET BY MOUTH EVERY DAY 09/13/22   [place], Peter M, MD  ammonium lactate (AMLACTIN) 12 % cream Apply 1 g topically as needed for dry skin.    [provider]  atorvastatin (LIPITOR) 10 MG tablet Take 0.5 tablets (5 mg total) by mouth daily at 6 PM. 07/06/22   [place], Peter M, MD  cetirizine (ZYRTEC) 10 MG tablet Take 10 mg by mouth daily.    [provider]  docusate sodium (COLACE) 100 MG capsule Take 1 capsule (100 mg total) by mouth 2 (two) times daily. 02/25/21   Harrie Foreman, PA-C  losartan (COZAAR) 50 MG tablet TAKE 1 TABLET BY MOUTH EVERY DAY 04/18/22   [place], Peter M, MD  Magnesium Hydroxide (MAGNESIA PO) Take by mouth.    [provider]  Melatonin 1 MG CAPS Take 1-5 mg by mouth at bedtime as needed.    [provider]  Multiple Vitamin (MULTIVITAMIN) tablet Take 1 tablet by mouth daily.    [provider]  mupirocin ointment (BACTROBAN) 2 % Apply 1 application  topically 2 (two) times daily.    [provider]  nitroGLYCERIN (NITROSTAT) 0.4 MG SL tablet *PLACE 1 TABLET UNDER TONGUE EVERY 5 MINS., UP TO 3 DOSES AS NEEDED FOR CHEST PAIN* 11/02/20   [place], Peter M, MD  omeprazole (PRILOSEC) 40 MG capsule Take 40 mg by mouth daily before breakfast.    [provider]  promethazine (PHENERGAN) 12.5 MG tablet Take 1 tablet (12.5 mg total) by mouth every 4 (four) hours as needed for nausea or vomiting. 02/25/21   Harrie Foreman, PA-C  traMADol (ULTRAM) 50 MG tablet Take 1-2 tablets (50-100 mg total) by mouth every 6 (six) hours as needed for moderate pain or severe pain. 02/25/21   Harrie Foreman, PA-C    Physical Exam: Vitals:   11/09/22 0202 11/09/22 0212 11/09/22 0230 11/09/22 0300  BP: (!) 174/73  (!) 158/87 (!) 158/126  Pulse: (!) 54  60 72  Resp: 14  15 19   Temp:      TempSrc:      SpO2: 100%     Weight:  72.6 kg    Height:  5\' 10"  (1.778 m)     Physical  Exam Vitals and nursing note reviewed.  Constitutional:      General: He is not in acute distress. HENT:     Head: Normocephalic and atraumatic.  Cardiovascular:     Rate and Rhythm: Regular rhythm. Bradycardia present.     Heart sounds: Normal heart sounds.  Pulmonary:  Effort: Pulmonary effort is normal.     Breath sounds: Normal breath sounds.  Abdominal:     Palpations: Abdomen is soft.     Tenderness: There is no abdominal tenderness.  Neurological:     Mental Status: Mental status is at baseline.     Labs on Admission: I have personally reviewed following labs and imaging studies  CBC: Recent Labs  Lab 11/09/22 0103  WBC 10.1  NEUTROABS 7.4  HGB 11.2*  HCT 33.9*  MCV 101.5*  PLT 155   Basic Metabolic Panel: Recent Labs  Lab 11/09/22 0103  NA 139  K 3.6  CL 108  CO2 22  GLUCOSE 107*  BUN 32*  CREATININE 1.10  CALCIUM 8.8*  MG 2.0   GFR: Estimated Creatinine Clearance: 49.5 mL/min (by C-G formula based on SCr of 1.1 mg/dL). Liver Function Tests: Recent Labs  Lab 11/09/22 0103  AST 18  ALT 13  ALKPHOS 59  BILITOT 0.8  PROT 6.3*  ALBUMIN 3.9   No results for input(s): "LIPASE", "AMYLASE" in the last 168 hours. No results for input(s): "AMMONIA" in the last 168 hours. Coagulation Profile: No results for input(s): "INR", "PROTIME" in the last 168 hours. Cardiac Enzymes: Recent Labs  Lab 11/09/22 0103  CKTOTAL 86   BNP (last 3 results) No results for input(s): "PROBNP" in the last 8760 hours. HbA1C: No results for input(s): "HGBA1C" in the last 72 hours. CBG: No results for input(s): "GLUCAP" in the last 168 hours. Lipid Profile: No results for input(s): "CHOL", "HDL", "LDLCALC", "TRIG", "CHOLHDL", "LDLDIRECT" in the last 72 hours. Thyroid Function Tests: No results for input(s): "TSH", "T4TOTAL", "FREET4", "T3FREE", "THYROIDAB" in the last 72 hours. Anemia Panel: No results for input(s): "VITAMINB12", "FOLATE", "FERRITIN", "TIBC",  "IRON", "RETICCTPCT" in the last 72 hours. Urine analysis:    Component Value Date/Time   COLORURINE YELLOW (A) 11/09/2022 0201   APPEARANCEUR HAZY (A) 11/09/2022 0201   LABSPEC 1.020 11/09/2022 0201   PHURINE 5.0 11/09/2022 0201   GLUCOSEU NEGATIVE 11/09/2022 0201   GLUCOSEU NEGATIVE 08/01/2022 1343   HGBUR NEGATIVE 11/09/2022 0201   BILIRUBINUR NEGATIVE 11/09/2022 0201   KETONESUR 5 (A) 11/09/2022 0201   PROTEINUR 100 (A) 11/09/2022 0201   UROBILINOGEN 0.2 08/01/2022 1343   NITRITE NEGATIVE 11/09/2022 0201   LEUKOCYTESUR NEGATIVE 11/09/2022 0201    Radiological Exams on Admission: DG Pelvis Portable  Result Date: 11/09/2022 CLINICAL DATA:  Recent fall with pelvic pain, initial encounter EXAM: PORTABLE PELVIS 1 VIEWS COMPARISON:  12/14/2020 FINDINGS: Pelvic ring is intact. Mild retained fecal material is noted. No obstructive changes are seen. No acute fracture or dislocation is noted. Penile prosthesis is seen. IMPRESSION: No acute bony abnormality noted. Electronically Signed   By: Alcide Clever M.D.   On: 11/09/2022 01:38   DG Chest Port 1 View  Result Date: 11/09/2022 CLINICAL DATA:  Recent fall, initial encounter EXAM: PORTABLE CHEST 1 VIEW COMPARISON:  10/31/2022 FINDINGS: Cardiac shadow is stable. Postsurgical changes are again seen. Lungs are well aerated bilaterally. No focal infiltrate or effusion is noted. No bony abnormality is noted. IMPRESSION: No active disease. Electronically Signed   By: Alcide Clever M.D.   On: 11/09/2022 01:37   CT Cervical Spine Wo Contrast  Result Date: 11/09/2022 CLINICAL DATA:  Patient found down. EXAM: CT CERVICAL SPINE WITHOUT CONTRAST TECHNIQUE: Multidetector CT imaging of the cervical spine was performed without intravenous contrast. Multiplanar CT image reconstructions were also generated. RADIATION DOSE REDUCTION: This exam was performed according to  the departmental dose-optimization program which includes automated exposure control,  adjustment of the mA and/or kV according to patient size and/or use of iterative reconstruction technique. COMPARISON:  October 31, 2022 FINDINGS: Alignment: There is approximately 1 mm anterolisthesis of the C7 vertebral body on T1. Skull base and vertebrae: No acute fracture. No primary bone lesion or focal pathologic process. Soft tissues and spinal canal: No prevertebral fluid or swelling. No visible canal hematoma. Disc levels: Mild endplate sclerosis, mild to moderate severity anterior osteophyte formation and moderate severity posterior bony spurring are seen at the levels of C2-C3, C3-C4, C4-C5, C5-C6 and C6-C7. Mild to moderate severity multilevel intervertebral disc space narrowing is seen throughout all levels of the cervical spine. Bilateral moderate severity multilevel facet joint hypertrophy is noted. Upper chest: Negative. Other: None. IMPRESSION: 1. No acute fracture within the cervical spine. 2. Moderate severity multilevel degenerative changes. 3. Approximately 1 mm anterolisthesis of the C7 vertebral body on T1. Electronically Signed   By: Aram Candelahaddeus  Houston M.D.   On: 11/09/2022 01:33   CT Head Wo Contrast  Result Date: 11/09/2022 CLINICAL DATA:  Patient found down. EXAM: CT HEAD WITHOUT CONTRAST TECHNIQUE: Contiguous axial images were obtained from the base of the skull through the vertex without intravenous contrast. RADIATION DOSE REDUCTION: This exam was performed according to the departmental dose-optimization program which includes automated exposure control, adjustment of the mA and/or kV according to patient size and/or use of iterative reconstruction technique. COMPARISON:  October 31, 2022 FINDINGS: Brain: There is moderate severity cerebral atrophy with widening of the extra-axial spaces and ventricular dilatation. There are areas of decreased attenuation within the white matter tracts of the supratentorial brain, consistent with microvascular disease changes. Vascular: No hyperdense  vessel or unexpected calcification. Skull: Normal. Negative for fracture or focal lesion. Sinuses/Orbits: Chronic and postoperative changes are seen involving the medial walls of the bilateral maxillary sinuses. Other: None. IMPRESSION: 1. No acute intracranial abnormality. 2. Cerebral atrophy and microvascular disease changes of the supratentorial brain. Electronically Signed   By: Aram Candelahaddeus  Houston M.D.   On: 11/09/2022 01:30     Data Reviewed: Relevant notes from primary care and specialist visits, past discharge summaries as available in EHR, including Care Everywhere. Prior diagnostic testing as pertinent to current admission diagnoses Updated medications and problem lists for reconciliation ED course, including vitals, labs, imaging, treatment and response to treatment Triage notes, nursing and pharmacy notes and ED provider's notes Notable results as noted in HPI   Assessment and Plan: * Syncope and collapse Sinus bradycardia with RBBB No evidence of injury on imaging studies Workup unrevealing Some concern for symptomatic bradycardia as etiology of falls Continuous cardiac monitoring, echocardiogram Received a 1 L NS bolus in the ED Neurologic checks fall and aspiration precautions Avoid AV nodal agents for BP control  Hypertensive urgency SBP over 200 on arrival, improving after IV hydralazine 5 mg suspect in part related to pain Continue home antihypertensives of losartan and amlodipine   History of renal cell carcinoma s/p partial left nephrectomy 01/2021 No acute issues suspected Will get D-dimer with consideration for CTA chest if elevated though low suspicion for same  Dementia Not on any medication Delirium precautions  History of atrial fibrillation Not currently on systemic anticoagulation no rate control agents Continue telemetry  Coronary artery disease of bypass graft of native heart with stable angina pectoris Troponin was negative at 11 and EKG showed no  ischemic changes Continue atorvastatin 5 mg, losartan and nitroglycerin sublingual as needed  DVT prophylaxis: Lovenox  Consults: none  Advance Care Planning:   Code Status: DNR  Family Communication: wife at bedside  Disposition Plan: Back to previous home environment  Severity of Illness: The appropriate patient status for this patient is OBSERVATION. Observation status is judged to be reasonable and necessary in order to provide the required intensity of service to ensure the patient's safety. The patient's presenting symptoms, physical exam findings, and initial radiographic and laboratory data in the context of their medical condition is felt to place them at decreased risk for further clinical deterioration. Furthermore, it is anticipated that the patient will be medically stable for discharge from the hospital within 2 midnights of admission.   Author: Andris Baumann, MD 11/09/2022 3:17 AM  For on call review [email or web address].

## 2022-11-09 NOTE — Assessment & Plan Note (Signed)
Not on any medication Delirium precautions

## 2022-11-09 NOTE — ED Triage Notes (Signed)
Patient BIB EMS for a c/c of fall. Patient was found down in the bathroom half in tub/half out. Unknown how long down or if LOC. Patient has advanced dementia and has fallen three times per EMS. Fentanyl given by EMS for pain control. Patient unable to answer questions.

## 2022-11-09 NOTE — Evaluation (Signed)
Occupational Therapy Evaluation Patient Details Name: Zachary Hall MRN: [id number] DOB: [birth year] Today's Date: 11/09/2022   History of Present Illness 88 y.o. male with medical history significant for CAD s/p CABG, dementia, left renal carcinoma s/p partial left nephrectomy 01/2021, RBBB with sinus bradycardia and no indication for pacemaker (per review of cardiology note from 11/02/2022), remote history of paroxysmal A-fib, who was brought to the ED after a fall in the bathtub.  Wife at the bedside who gives most of the history says she heard a thud and when she rushed to the bathroom found him halfway in, halfway out of bathtub.  He was awake and alert and did not appear to have injuries.  Patient had fallen 3 times the day prior.  Wife is concerned that in the previous days he was constipated and she gave him a laxative and he had a few episodes of loose stool to where she had to place him in a diaper.  Otherwise he had been in his usual state of health.   Clinical Impression   Patient presenting with decreased Ind in self care,balance, functional mobility/transfers, endurance, and safety awareness. Patient unable to answer questions and having tangential speech throughout needing to be redirected to tasks. OT was able to speak with wife who reports pt does not use AD at baseline for ambulation and walks with SBA/supervision but has had 15+ falls in last 6 month. Pt is normally able to wash himself in the shower and wife assists with dressing and IADLs.  Patient currently functioning at mod A overall for functional mobility. He is internally and externally distracted during session needing increased cuing for redirection for safety awareness and to initiate. Pt's wife states she is unable to provide this level of assistance at home. Patient will benefit from acute OT to increase overall independence in the areas of ADLs, functional mobility, and safety awareness in order to safely discharge.      Recommendations for follow up therapy are one component of a multi-disciplinary discharge planning process, led by the attending physician.  Recommendations may be updated based on patient status, additional functional criteria and insurance authorization.   Assistance Recommended at Discharge Frequent or constant Supervision/Assistance  Patient can return home with the following A lot of help with walking and/or transfers;A lot of help with bathing/dressing/bathroom;Assistance with cooking/housework;Assist for transportation;Help with stairs or ramp for entrance    Functional Status Assessment  Patient has had a recent decline in their functional status and demonstrates the ability to make significant improvements in function in a reasonable and predictable amount of time.  Equipment Recommendations  Other (comment) (defer)       Precautions / Restrictions Precautions Precautions: Fall      Mobility Bed Mobility Overal bed mobility: Needs Assistance Bed Mobility: Supine to Sit, Sit to Supine     Supine to sit: Min assist Sit to supine: Min guard   General bed mobility comments: mod- max multimodal cuing as pt is internally and externally distracted and needing cuing to return to task    Transfers Overall transfer level: Needs assistance Equipment used: 1 person hand held assist Transfers: Sit to/from Stand, Bed to chair/wheelchair/BSC Sit to Stand: Min assist     Step pivot transfers: Mod assist            Balance Overall balance assessment: Needs assistance Sitting-balance support: Feet supported Sitting balance-Leahy Scale: Fair     Standing balance support: During functional activity, Bilateral upper extremity supported Standing balance-Leahy  Scale: Poor                             ADL either performed or assessed with clinical judgement   ADL Overall ADL's : Needs assistance/impaired                     Lower Body Dressing: Maximal  assistance;Sit to/from stand   Toilet Transfer: Moderate assistance;BSC/3in1                   Vision Patient Visual Report: No change from baseline              Pertinent Vitals/Pain Pain Assessment Pain Assessment: Faces Faces Pain Scale: No hurt     Hand Dominance Right   Extremity/Trunk Assessment Upper Extremity Assessment Upper Extremity Assessment: Generalized weakness   Lower Extremity Assessment Lower Extremity Assessment: Generalized weakness       Communication Communication Communication: No difficulties   Cognition Arousal/Alertness: Awake/alert Behavior During Therapy: Restless Overall Cognitive Status: History of cognitive impairments - at baseline                                 General Comments: dementia at baseline. Pt's wife reports is is progressively getting worse and he sundowns around 4 pm.                Home Living Family/patient expects to be discharged to:: Private residence Living Arrangements: Spouse/significant other Available Help at Discharge: Family;Available 24 hours/day Type of Home: House Home Access: Stairs to enter [employer] of Steps: 3   Home Layout: One level     Bathroom Shower/Tub: Walk-in shower         Home Equipment: Agricultural consultant (2 wheels);Cane - single point;Shower seat;Wheelchair - manual          Prior Functioning/Environment Prior Level of Function : Needs assist;History of Falls (last six months)             Mobility Comments: Pt's wife reports over 15 falls in the last 6 months. ADLs Comments: Pt has multiple AD that he will not use at home but normally ambulates with her nearby. Pt's wife reports pt baths himself and she assists with dressing secondary to cognition. He normally feeds himself and she assists with IADLs.        OT Problem List: Decreased strength;Decreased activity tolerance;Decreased safety awareness;Impaired balance (sitting and/or  standing);Decreased knowledge of use of DME or AE;Decreased cognition      OT Treatment/Interventions: Self-care/ADL training;Therapeutic exercise;Therapeutic activities;Energy conservation;Patient/family education;DME and/or AE instruction;Balance training    OT Goals(Current goals can be found in the care plan section) Acute Rehab OT Goals Patient Stated Goal: to get decrease falls and get me (pt's wife) some help OT Goal Formulation: With family Time For Goal Achievement: 11/23/22 Potential to Achieve Goals: Fair ADL Goals Pt Will Perform Grooming: with supervision;standing Pt Will Perform Lower Body Dressing: with min assist;sit to/from stand Pt Will Transfer to Toilet: with supervision;ambulating Pt Will Perform Toileting - Clothing Manipulation and hygiene: with min guard assist;sit to/from stand Pt/caregiver will Perform Home Exercise Program: Both right and left upper extremity;With theraband;Increased strength;With written HEP provided;With minimal assist  OT Frequency: Min 2X/week       AM-PAC OT "6 Clicks" Daily Activity     Outcome Measure Help from another person eating meals?: A Little Help  from another person taking care of personal grooming?: A Little Help from another person toileting, which includes using toliet, bedpan, or urinal?: A Lot Help from another person bathing (including washing, rinsing, drying)?: A Lot Help from another person to put on and taking off regular upper body clothing?: A Lot Help from another person to put on and taking off regular lower body clothing?: A Lot 6 Click Score: 14   End of Session Nurse Communication: Mobility status  Activity Tolerance: Patient limited by fatigue;Other (comment) (limited by cognition) Patient left: in bed;with call bell/phone within reach;with bed alarm set  OT Visit Diagnosis: Unsteadiness on feet (R26.81);Repeated falls (R29.6);Muscle weakness (generalized) (M62.81)                Time: 0946-1000 OT Time  Calculation (min): 14 min Charges:  OT General Charges $OT Visit: 1 Visit OT Evaluation $OT Eval Moderate Complexity: 1 Mod OT Treatments $Therapeutic Activity: 8-22 mins  Jackquline DenmarkKatie Arieana Somoza, MS, OTR/L , CBIS ascom [phone]  11/09/22, 11:30 AM

## 2022-11-09 NOTE — ED Notes (Signed)
ED Provider at bedside speaking with family.  

## 2022-11-09 NOTE — ED Provider Notes (Signed)
[facility] Provider Note    Event Date/Time   First MD Initiated Contact with Patient 11/09/22 [phone]     (approximate)   History   Fall   HPI  Level V caveat: Limited by dementia  Zachary Hall is a 88 y.o. male brought to the ED via EMS from home status post fall.  EMS was called to the house 3 times yesterday for recurrent falls.  Patient was found in the dry bathtub half in and half out.  Unknown how long patient was down or if he suffered LOC.  Patient with a history of advanced dementia.  Daughter reported to EMS that patient complained of "pain" all day but did not specify location.  Rest of history is limited secondary to dementia.     Past Medical History   Past Medical History:  Diagnosis Date   Arthritis    Balance problem    Barrett esophagus    Bradycardia    Coronary artery disease    GERD (gastroesophageal reflux disease)    Gout    History of atrial fibrillation    Previously on amiodarone   Hx of CABG    Hypercholesterolemia    Hypertension    Left renal mass    Melanoma    Left shoulder blade   Nocturia    Right epiretinal membrane 02/10/2020     Active Problem List   Patient Active Problem List   Diagnosis Date Noted   DDD (degenerative disc disease), cervical 10/20/2022   History of renal cell carcinoma 08/31/2022   Knee joint stiffness, bilateral 08/31/2022   CKD (chronic kidney disease) stage 3, GFR 30-59 ml/min 06/01/2022   Constipation 06/01/2022   Anemia 05/11/2022   Thrombocytopenia 04/13/2022   Fall 04/13/2022   Unintentional weight loss 02/23/2022   Fatigue 02/23/2022   Balance problem 01/27/2022   Hyperglycemia 01/27/2022   Dementia 01/27/2022   Dementia due to Alzheimer's disease 09/07/2021   Macular pucker, left eye 08/23/2021   History of partial nephrectomy 08/21/2021   Type A blood, Rh positive 08/21/2021   Hyponatremia 08/21/2021   IFG (impaired fasting glucose) 08/21/2021   Low  hemoglobin 08/21/2021   Renal mass 02/25/2021   Right epiretinal membrane 02/10/2020   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   Pseudophakia of both eyes 02/10/2020   Posterior vitreous detachment of right eye 02/10/2020   Right inguinal hernia 01/21/2017   Mild cognitive impairment 09/29/2016   Essential hypertension 09/16/2015   Hyperlipidemia 09/16/2015   Stasis dermatitis 05/14/2014   Arteriosclerosis of coronary artery 05/04/2014   RBBB (right bundle branch block) 09/02/2013   H/O coronary artery bypass surgery 12/15/2010   Presence of aortocoronary bypass graft 12/15/2010   Bradycardia 11/21/2010   Coronary artery disease of bypass graft of native heart with stable angina pectoris    History of atrial fibrillation      Past Surgical History   Past Surgical History:  Procedure Laterality Date   CARDIAC CATHETERIZATION  01/04/2010   COLONOSCOPY     CORONARY ARTERY BYPASS GRAFT  02/07/2010   LIMA to LAD, SVG to 2nd DX, SVG to OM   EYE SURGERY Bilateral    Cataract Extraction   HEMORRHOID SURGERY     x 2   HEMORRHOID SURGERY N/A 11/13/2012   Procedure: PPH Hemorrhoidectomy;  Surgeon: Robyne Askew, MD;  Location: [facility] OR;  Service: General;  Laterality: N/A;   INGUINAL HERNIA REPAIR Right 01/22/2017   Procedure: RIGHT  INGUINAL HERNIA REPAIR WITH MESH;  Surgeon: Darnell LevelGerkin, Todd, MD;  [facility];  Service: General;  Laterality: Right;   INSERTION OF MESH Right 01/22/2017   Procedure: INSERTION OF MESH;  Surgeon: Darnell LevelGerkin, Todd, MD;  [facility];  Service: General;  Laterality: Right;   IR RADIOLOGIST EVAL & MGMT  01/06/2021   NASAL POLYP EXCISION     OTHER SURGICAL HISTORY  06/05/2002   Penile Implant   ROBOTIC ASSITED PARTIAL NEPHRECTOMY Left 02/25/2021   Procedure: XI ROBOTIC ASSITED PARTIAL NEPHRECTOMY;  Surgeon: Rene PaciWinter, Christopher Aaron, MD;  Location: [facility] ORS;  Service: Urology;  Laterality: Left;   UPPER GI ENDOSCOPY       Home Medications   Prior to  Admission medications   Medication Sig Start Date End Date Taking? Authorizing Provider  amLODipine (NORVASC) 10 MG tablet TAKE 1 TABLET BY MOUTH EVERY DAY 09/13/22   [place]Jordan, Peter M, MD  ammonium lactate (AMLACTIN) 12 % cream Apply 1 g topically as needed for dry skin.    [provider]  atorvastatin (LIPITOR) 10 MG tablet Take 0.5 tablets (5 mg total) by mouth daily at 6 PM. 07/06/22   [place]Jordan, Peter M, MD  cetirizine (ZYRTEC) 10 MG tablet Take 10 mg by mouth daily.    [provider]  docusate sodium (COLACE) 100 MG capsule Take 1 capsule (100 mg total) by mouth 2 (two) times daily. 02/25/21   Harrie Foremanancy, Amanda, PA-C  losartan (COZAAR) 50 MG tablet TAKE 1 TABLET BY MOUTH EVERY DAY 04/18/22   [place], Peter M, MD  Magnesium Hydroxide (MAGNESIA PO) Take by mouth.    [provider]  Melatonin 1 MG CAPS Take 1-5 mg by mouth at bedtime as needed.    [provider]  Multiple Vitamin (MULTIVITAMIN) tablet Take 1 tablet by mouth daily.    [provider]  mupirocin ointment (BACTROBAN) 2 % Apply 1 application  topically 2 (two) times daily.    [provider]  nitroGLYCERIN (NITROSTAT) 0.4 MG SL tablet *PLACE 1 TABLET UNDER TONGUE EVERY 5 MINS., UP TO 3 DOSES AS NEEDED FOR CHEST PAIN* 11/02/20   [place], Peter M, MD  omeprazole (PRILOSEC) 40 MG capsule Take 40 mg by mouth daily before breakfast.    [provider]  promethazine (PHENERGAN) 12.5 MG tablet Take 1 tablet (12.5 mg total) by mouth every 4 (four) hours as needed for nausea or vomiting. 02/25/21   Harrie Foremanancy, Amanda, PA-C  traMADol (ULTRAM) 50 MG tablet Take 1-2 tablets (50-100 mg total) by mouth every 6 (six) hours as needed for moderate pain or severe pain. 02/25/21   Harrie Foremanancy, Amanda, PA-C     Allergies  Lexapro [escitalopram], Namenda [memantine], and Lisinopril   Family History   Family History  Problem Relation Age of Onset   Alcohol abuse Father    Colon cancer Mother    Cancer  Mother        colon and bone   Heart attack Neg Hx    Hypertension Neg Hx    Stroke Neg Hx      Physical Exam  Triage Vital Signs: ED Triage Vitals [11/09/22 0049]  Enc Vitals Group     BP      Pulse      Resp      Temp      Temp src      SpO2 96 %     Weight      Height      Head Circumference  Peak Flow      Pain Score      Pain Loc      Pain Edu?      Excl. in GC?     Updated Vital Signs: BP (!) 216/79 (BP Location: Left Arm)   Pulse (!) 59   Temp 97.9 F (36.6 C) (Oral)   Resp 12   SpO2 99%    General: Awake, no distress.  Unable to sit up on his own secondary to weakness. CV:  RRR.  Good peripheral perfusion.  Resp:  Normal effort.  CTAB. Abd:  Nontender.  No distention.  Other:  PERRL.  EOMI.  Head is atraumatic.  Nose is atraumatic.  No dental malocclusion.  No midline cervical spine tenderness to palpation, step-offs or deformities.  Pelvis stable.   ED Results / Procedures / Treatments  Labs (all labs ordered are listed, but only abnormal results are displayed) Labs Reviewed  CBC WITH DIFFERENTIAL/PLATELET - Abnormal; Notable for the following components:      Result Value   RBC 3.34 (*)    Hemoglobin 11.2 (*)    HCT 33.9 (*)    MCV 101.5 (*)    All other components within normal limits  COMPREHENSIVE METABOLIC PANEL - Abnormal; Notable for the following components:   Glucose, Bld 107 (*)    BUN 32 (*)    Calcium 8.8 (*)    Total Protein 6.3 (*)    All other components within normal limits  CK  MAGNESIUM  URINALYSIS, ROUTINE W REFLEX MICROSCOPIC  TROPONIN I (HIGH SENSITIVITY)     EKG  ED ECG REPORT I, Juanya Villavicencio J, the attending physician, personally viewed and interpreted this ECG.   Date: 11/09/2022  EKG Time: 0058  Rate: 60  Rhythm: normal sinus rhythm  Axis: LAD  Intervals:left anterior fascicular block  ST&T Change: Nonspecific    RADIOLOGY I have independently visualized and interpreted patient's CT scans and x-rays  as well as noted the radiology interpretation:  CT head: No ICH  CT cervical spine: No acute osseous injury; 1 mm anterior listhesis of C7 on T1  Chest x-ray: No acute cardiopulmonary process  Pelvis x-ray: No acute bony abnormality  Official radiology report(s): DG Pelvis Portable  Result Date: 11/09/2022 CLINICAL DATA:  Recent fall with pelvic pain, initial encounter EXAM: PORTABLE PELVIS 1 VIEWS COMPARISON:  12/14/2020 FINDINGS: Pelvic ring is intact. Mild retained fecal material is noted. No obstructive changes are seen. No acute fracture or dislocation is noted. Penile prosthesis is seen. IMPRESSION: No acute bony abnormality noted. Electronically Signed   By: Alcide Clever M.D.   On: 11/09/2022 01:38   DG Chest Port 1 View  Result Date: 11/09/2022 CLINICAL DATA:  Recent fall, initial encounter EXAM: PORTABLE CHEST 1 VIEW COMPARISON:  10/31/2022 FINDINGS: Cardiac shadow is stable. Postsurgical changes are again seen. Lungs are well aerated bilaterally. No focal infiltrate or effusion is noted. No bony abnormality is noted. IMPRESSION: No active disease. Electronically Signed   By: Alcide Clever M.D.   On: 11/09/2022 01:37   CT Cervical Spine Wo Contrast  Result Date: 11/09/2022 CLINICAL DATA:  Patient found down. EXAM: CT CERVICAL SPINE WITHOUT CONTRAST TECHNIQUE: Multidetector CT imaging of the cervical spine was performed without intravenous contrast. Multiplanar CT image reconstructions were also generated. RADIATION DOSE REDUCTION: This exam was performed according to the departmental dose-optimization program which includes automated exposure control, adjustment of the mA and/or kV according to patient size and/or use of iterative reconstruction technique.  COMPARISON:  October 31, 2022 FINDINGS: Alignment: There is approximately 1 mm anterolisthesis of the C7 vertebral body on T1. Skull base and vertebrae: No acute fracture. No primary bone lesion or focal pathologic process. Soft tissues  and spinal canal: No prevertebral fluid or swelling. No visible canal hematoma. Disc levels: Mild endplate sclerosis, mild to moderate severity anterior osteophyte formation and moderate severity posterior bony spurring are seen at the levels of C2-C3, C3-C4, C4-C5, C5-C6 and C6-C7. Mild to moderate severity multilevel intervertebral disc space narrowing is seen throughout all levels of the cervical spine. Bilateral moderate severity multilevel facet joint hypertrophy is noted. Upper chest: Negative. Other: None. IMPRESSION: 1. No acute fracture within the cervical spine. 2. Moderate severity multilevel degenerative changes. 3. Approximately 1 mm anterolisthesis of the C7 vertebral body on T1. Electronically Signed   By: Aram Candela M.D.   On: 11/09/2022 01:33   CT Head Wo Contrast  Result Date: 11/09/2022 CLINICAL DATA:  Patient found down. EXAM: CT HEAD WITHOUT CONTRAST TECHNIQUE: Contiguous axial images were obtained from the base of the skull through the vertex without intravenous contrast. RADIATION DOSE REDUCTION: This exam was performed according to the departmental dose-optimization program which includes automated exposure control, adjustment of the mA and/or kV according to patient size and/or use of iterative reconstruction technique. COMPARISON:  October 31, 2022 FINDINGS: Brain: There is moderate severity cerebral atrophy with widening of the extra-axial spaces and ventricular dilatation. There are areas of decreased attenuation within the white matter tracts of the supratentorial brain, consistent with microvascular disease changes. Vascular: No hyperdense vessel or unexpected calcification. Skull: Normal. Negative for fracture or focal lesion. Sinuses/Orbits: Chronic and postoperative changes are seen involving the medial walls of the bilateral maxillary sinuses. Other: None. IMPRESSION: 1. No acute intracranial abnormality. 2. Cerebral atrophy and microvascular disease changes of the  supratentorial brain. Electronically Signed   By: Aram Candela M.D.   On: 11/09/2022 01:30     PROCEDURES:  Critical Care performed: No  .1-3 Lead EKG Interpretation  Performed by: Irean Hong, MD Authorized by: Irean Hong, MD     Interpretation: normal     ECG rate:  60   ECG rate assessment: normal     Rhythm: sinus rhythm     Ectopy: none     Conduction: normal   Comments:     Patient placed on cardiac monitor to evaluate for arrhythmias    MEDICATIONS ORDERED IN ED: Medications  sodium chloride 0.9 % bolus 500 mL (has no administration in time range)  hydrALAZINE (APRESOLINE) injection 5 mg (has no administration in time range)     IMPRESSION / MDM / ASSESSMENT AND PLAN / ED COURSE  I reviewed the triage vital signs and the nursing notes.                             88 year old male brought from home for recurrent falls and generalized weakness.  Differential diagnosis includes but is not limited to CVA, ACS, infectious, metabolic etiologies, etc.  I personally reviewed patient's records and note a palliative care home visit on 11/07/2022.  Patient comes with DO NOT RESUSCITATE form.  Patient's presentation is most consistent with acute presentation with potential threat to life or bodily function.  The patient is on the cardiac monitor to evaluate for evidence of arrhythmia and/or significant heart rate changes.  Will obtain cardiac panel, UA, CT head and cervical spine, x-ray  chest and pelvis.  Initiate gentle IV fluid hydration and reassess.  Clinical Course as of 11/09/22 0238  Thu Nov 09, 2022  0218 Updated patient and his caregiver on laboratory and imaging results.  Awaiting UA.  Blood pressure improved, currently 174/73 after IV hydralazine.  Will consult hospital services for evaluation and admission after urine results. [JS]    Clinical Course User Index [JS] Irean Hong, MD     FINAL CLINICAL IMPRESSION(S) / ED DIAGNOSES   Final diagnoses:   Fall, initial encounter  Weakness generalized  Hypertension, unspecified type     Rx / DC Orders   ED Discharge Orders     None        Note:  This document was prepared using Dragon voice recognition software and may include unintentional dictation errors.   Irean Hong, MD 11/09/22 [phone]

## 2022-11-09 NOTE — Assessment & Plan Note (Addendum)
No acute issues suspected Will get D-dimer with consideration for CTA chest if elevated though low suspicion for same

## 2022-11-09 NOTE — ED Notes (Signed)
Pt to CT via rad staff and stretcher

## 2022-11-09 NOTE — Progress Notes (Signed)
PT Cancellation Note  Patient Details Name: Zachary Hall MRN: [id number] DOB: [birth year]   Cancelled Treatment:    Reason Eval/Treat Not Completed: Other (comment) Attempted to see pt X2 this date.  First this AM in the ED, he was on bed pain.  Attempted in the PM in 2C, he was in bed with mitts on with non-stop fidgeting and with constant rambling with low grade agitation.  Nurse was in room and reports that he has not been following instructions and has been highly distracted and tangential.  Pt was not in a state to safely attempt working with PT at this time, will maintain on caseload and attempt to see when pt more appropriate.  Malachi Pro, DPT 11/09/2022, 5:23 PM

## 2022-11-09 NOTE — Plan of Care (Signed)
Patient was seen and examined in the ED, resting comfortably.  Patient was admitted with complaining of syncope and collapse.  It seems that patient's advanced dementia is approximately getting worse.  Patient had hypertensive urgency as well, currently blood pressure is well-controlled.  We will continue to monitor.  PT and OT order placed, TOC may need to follow for placement. Patient was agitated so started on Seroquel 25 mg p.o. twice daily Patient's wife over the phone, patient was having some resting tremors and rigidity, we will try Sinemet 10-100 mg p.o. 3 times daily and see the response. We will continue to monitor for symptom improvement and plan for disposition in 1 to 2 days.

## 2022-11-10 DIAGNOSIS — R55 Syncope and collapse: Secondary | ICD-10-CM | POA: Diagnosis not present

## 2022-11-10 LAB — CBC
HCT: 35.1 % — ABNORMAL LOW (ref 39.0–52.0)
Hemoglobin: 12 g/dL — ABNORMAL LOW (ref 13.0–17.0)
MCH: 34.1 pg — ABNORMAL HIGH (ref 26.0–34.0)
MCHC: 34.2 g/dL (ref 30.0–36.0)
MCV: 99.7 fL (ref 80.0–100.0)
Platelets: 170 10*3/uL (ref 150–400)
RBC: 3.52 MIL/uL — ABNORMAL LOW (ref 4.22–5.81)
RDW: 13.2 % (ref 11.5–15.5)
WBC: 7.9 10*3/uL (ref 4.0–10.5)
nRBC: 0 % (ref 0.0–0.2)

## 2022-11-10 LAB — MAGNESIUM: Magnesium: 2 mg/dL (ref 1.7–2.4)

## 2022-11-10 LAB — BASIC METABOLIC PANEL
Anion gap: 9 (ref 5–15)
BUN: 23 mg/dL (ref 8–23)
CO2: 25 mmol/L (ref 22–32)
Calcium: 9 mg/dL (ref 8.9–10.3)
Chloride: 106 mmol/L (ref 98–111)
Creatinine, Ser: 0.99 mg/dL (ref 0.61–1.24)
GFR, Estimated: >60 mL/min (ref >60)
Glucose, Bld: 102 mg/dL — ABNORMAL HIGH (ref 70–99)
Potassium: 3.5 mmol/L (ref 3.5–5.1)
Sodium: 140 mmol/L (ref 135–145)

## 2022-11-10 LAB — PHOSPHORUS: Phosphorus: 3.3 mg/dL (ref 2.5–4.6)

## 2022-11-10 MED ORDER — ISOSORBIDE MONONITRATE ER 30 MG PO TB24
60.0000 mg | ORAL_TABLET | Freq: Every evening | ORAL | Status: DC
  Administered 2022-11-10 – 2022-11-13 (×4): 60 mg via ORAL
  Filled 2022-11-10 (×4): qty 2

## 2022-11-10 MED ORDER — HYDRALAZINE HCL 20 MG/ML IJ SOLN: 10.0000 mg | Freq: Four times a day (QID) | INTRAMUSCULAR | Status: DC | PRN

## 2022-11-10 MED ORDER — HYDRALAZINE HCL 50 MG PO TABS: 50.0000 mg | ORAL_TABLET | Freq: Four times a day (QID) | ORAL | Status: DC | PRN

## 2022-11-10 MED ORDER — QUETIAPINE FUMARATE 25 MG PO TABS
50.0000 mg | ORAL_TABLET | Freq: Two times a day (BID) | ORAL | Status: DC
  Administered 2022-11-10 – 2022-11-14 (×8): 50 mg via ORAL
  Filled 2022-11-10 (×9): qty 2

## 2022-11-10 NOTE — Care Management Obs Status (Signed)
MEDICARE OBSERVATION STATUS NOTIFICATION   Patient Details  Name: Zachary Hall MRN: [id number] Date of Birth: 03-02-1935   Medicare Observation Status Notification Given:  Yes    Margarito Liner, LCSW 11/10/2022, 4:29 PM

## 2022-11-10 NOTE — Plan of Care (Signed)
  Problem: Activity: Goal: Risk for activity intolerance will decrease Outcome: Progressing   Problem: Nutrition: Goal: Adequate nutrition will be maintained Outcome: Progressing   Problem: Elimination: Goal: Will not experience complications related to urinary retention Outcome: Progressing   Problem: Safety: Goal: Ability to remain free from injury will improve Outcome: Progressing   Problem: Skin Integrity: Goal: Risk for impaired skin integrity will decrease Outcome: Progressing   

## 2022-11-10 NOTE — Evaluation (Signed)
Physical Therapy Evaluation Patient Details Name: Zachary Hall MRN: [id number] DOB: 1935-07-15 Today's Date: 11/10/2022  History of Present Illness  88 y.o. male with medical history significant for CAD s/p CABG, dementia, left renal carcinoma s/p partial left nephrectomy 01/2021, RBBB with sinus bradycardia and no indication for pacemaker (per review of cardiology note from 11/02/2022), remote history of paroxysmal A-fib, who was brought to the ED after a fall in the bathtub.  Wife at the bedside who gives most of the history says she heard a thud and when she rushed to the bathroom found him halfway in, halfway out of bathtub.  He was awake and alert and did not appear to have injuries.  Patient had fallen 3 times the day prior.  Wife is concerned that in the previous days he was constipated and she gave him a laxative and he had a few episodes of loose stool to where she had to place him in a diaper.  Otherwise he had been in his usual state of health.   Clinical Impression  Pt admitted with above diagnosis. Pt currently with functional limitations due to the deficits listed below (see PT Problem List). Pt received supine in bed asleep. Easily arousable to voice. No family present during session thus reliant on EMR for PLOF, home lay out, etc. Per EMR, pt is ambulatory without AD and relies on spouse for ADL's/IADL's due to cognition.   To date, pt is pleasant but very tangential with his speech relying on mod to max multimodal cuing and hand over hand assist to perform mobility. Pt modA for bed mobility with bed features, minA with HHA+1 for STS and modA+1 On RW for SPT to recliner. With standing and SPT, pt with posterior bias reliant on modA at torso to prevent posterior LOB. RW attempted due to posterior lean as visual and tactile cue for upright standing but due to cognition, likely pt does not have familiar motor patterns to utilize safely as pt does not use AD at baseline. Pt safely returns to  recliner with needed multi modal cues and hand over hand guidance for hand placement. Safety precautions in place with all needs in reach. Further mobility deferred due to limited insight to deficits, posterior bias with transfer, and inability to follow commands placing pt at very high risk for falls. PT to recommend additional PT services (< 3 hours/day) to address these deficits.     Recommendations for follow up therapy are one component of a multi-disciplinary discharge planning process, led by the attending physician.  Recommendations may be updated based on patient status, additional functional criteria and insurance authorization.  Follow Up Recommendations Can patient physically be transported by private vehicle: No     Assistance Recommended at Discharge Frequent or constant Supervision/Assistance  Patient can return home with the following  A lot of help with bathing/dressing/bathroom;A lot of help with walking and/or transfers;Assist for transportation;Help with stairs or ramp for entrance    Equipment Recommendations Other (comment) (TBD by next venue of care.)  Recommendations for Other Services       Functional Status Assessment Patient has had a recent decline in their functional status and/or demonstrates limited ability to make significant improvements in function in a reasonable and predictable amount of time     Precautions / Restrictions Precautions Precautions: Fall Restrictions Weight Bearing Restrictions: No      Mobility  Bed Mobility Overal bed mobility: Needs Assistance Bed Mobility: Supine to Sit     Supine to  sit: Mod assist, HOB elevated     General bed mobility comments: max multimodal cuing and hand over hand for transition to EOB. Patient Response: Cooperative, Flat affect  Transfers Overall transfer level: Needs assistance Equipment used: 1 person hand held assist, Rolling walker (2 wheels) Transfers: Sit to/from Stand, Bed to  chair/wheelchair/BSC Sit to Stand: Min assist   Step pivot transfers: Mod assist       General transfer comment: HHA+1 for STS. Notable posterior bias in standing thus attempting RW. Per EMR, pt does not use AD at baseline, thus RW to SPT to recliner may have limited pt in transfer due to unfamilar motor pattern leading to [facility] for transfer.    Ambulation/Gait               General Gait Details: deferrd due to difficulty with transfer and limited ability to follow commands.  Stairs            Wheelchair Mobility    Modified Rankin (Stroke Patients Only)       Balance   Sitting-balance support: Feet supported Sitting balance-Leahy Scale: Fair   Postural control: Posterior lean Standing balance support: During functional activity, Bilateral upper extremity supported Standing balance-Leahy Scale: Poor Standing balance comment: posterior bias in standing relying on support at trunk and HHA+1 to maintain statically.                             Pertinent Vitals/Pain Pain Assessment Pain Assessment: Faces Faces Pain Scale: No hurt    Home Living Family/patient expects to be discharged to:: Private residence Living Arrangements: Spouse/significant other Available Help at Discharge: Family;Available 24 hours/day Type of Home: House Home Access: Stairs to enter   [employer] of Steps: 3   Home Layout: One level Home Equipment: Agricultural consultant (2 wheels);Cane - single point;Shower seat;Wheelchair - manual      Prior Function Prior Level of Function : Needs assist;History of Falls (last six months)             Mobility Comments: Per EMR, as many as 15 fallls in the last 6 months. ADLs Comments: Pt has multiple AD that he will not use at home but normally ambulates with her nearby. Pt's wife reports pt baths himself and she assists with dressing secondary to cognition. He normally feeds himself and she assists with IADLs.     Hand  Dominance   Dominant Hand: Right    Extremity/Trunk Assessment   Upper Extremity Assessment Upper Extremity Assessment: Generalized weakness    Lower Extremity Assessment Lower Extremity Assessment: Generalized weakness       Communication   Communication: No difficulties  Cognition Arousal/Alertness: Awake/alert Behavior During Therapy: Flat affect Overall Cognitive Status: History of cognitive impairments - at baseline                                 General Comments: Tangential speech. Pleasant and generally cooperative. Requires max hand over hand cues to follow commands.        General Comments      Exercises     Assessment/Plan    PT Assessment Patient needs continued PT services  PT Problem List Decreased strength;Decreased cognition;Decreased knowledge of use of DME;Decreased activity tolerance;Decreased safety awareness;Decreased balance;Decreased mobility       PT Treatment Interventions DME instruction;Balance training;Gait training;Neuromuscular re-education;Stair training;Functional mobility training;Patient/family education;Therapeutic activities;Therapeutic exercise;Cognitive remediation  PT Goals (Current goals can be found in the Care Plan section)  Acute Rehab PT Goals PT Goal Formulation: Patient unable to participate in goal setting    Frequency Min 2X/week     Co-evaluation               AM-PAC PT "6 Clicks" Mobility  Outcome Measure Help needed turning from your back to your side while in a flat bed without using bedrails?: A Lot Help needed moving from lying on your back to sitting on the side of a flat bed without using bedrails?: A Lot Help needed moving to and from a bed to a chair (including a wheelchair)?: A Lot Help needed standing up from a chair using your arms (e.g., wheelchair or bedside chair)?: A Little Help needed to walk in hospital room?: A Lot Help needed climbing 3-5 steps with a railing? : Total 6  Click Score: 12    End of Session Equipment Utilized During Treatment: Gait belt Activity Tolerance: Patient tolerated treatment well Patient left: in chair;with call bell/phone within reach;with chair alarm set Nurse Communication: Mobility status PT Visit Diagnosis: Unsteadiness on feet (R26.81);Muscle weakness (generalized) (M62.81);History of falling (Z91.81);Difficulty in walking, not elsewhere classified (R26.2)    Time: 8416-6063 PT Time Calculation (min) (ACUTE ONLY): 16 min   Charges:   PT Evaluation $PT Eval Moderate Complexity: 1 Mod         Amadi Frady M. Fairly IV, PT, DPT Physical Therapist- [place]  [facility]  11/10/2022, 9:54 AM

## 2022-11-10 NOTE — Progress Notes (Signed)
[facility] Progress Note  Patient: Zachary Hall    TXH:[id number]  DOA: 11/09/2022     Date of Service: the patient was seen and examined on 11/10/2022  Chief Complaint  Patient presents with   Fall   Brief hospital course: [name] is a 88 y.o. male with medical history significant for CAD s/p CABG, dementia, left renal carcinoma s/p partial left nephrectomy 01/2021, RBBB with sinus bradycardia and no indication for pacemaker (per review of cardiology note from 11/02/2022), remote history of paroxysmal A-fib, who was brought to the ED after a fall in the bathtub.  Wife at the bedside who gives most of the history says she heard a thud and when she rushed to the bathroom found him halfway in, halfway out of bathtub.  He was awake and alert and did not appear to have injuries.  Patient had fallen 3 times the day prior.  Wife is concerned that in the previous days he was constipated and she gave him a laxative and he had a few episodes of loose stool to where she had to place him in a diaper.  Otherwise he had been in his usual state of health. ED course and data review: BP 216/79, with bradycardia at 254 and otherwise normal vitals.  Labs unremarkable except for baseline anemia of 11.2.  CK normal at 86. CT head and C-spine nonacute.Clear  Chest x-ray and pelvic x-ray nonacute. EKG, personally viewed and interpreted shows sinus bradycardia at 60 with RBBB and LAFB.  Patient was treated with a 500 mL sodium chloride bolus, given hydralazine 5 mg IV for BP with improvement 158/87 by admission. Hospitalist consulted for admission.    Assessment and Plan: * Syncope and collapse  Sinus bradycardia with RBBB No evidence of injury on imaging studies Workup unrevealing Some concern for symptomatic bradycardia as etiology of falls Continuous cardiac monitoring, echocardiogram Received a 1 L NS bolus in the ED Neurologic checks fall and aspiration precautions Avoid AV nodal agents for  BP control   Hypertensive urgency SBP over 200 on arrival, improving after IV hydralazine 5 mg suspect in part related to pain Continue home antihypertensives of losartan and amlodipine     History of renal cell carcinoma s/p partial left nephrectomy 01/2021 No acute disused suspected   Dementia Not on any medication Delirium precautions Patient has never been diagnosed with Parkinson disease but patient wife did notice resting tremors.  On exam patient has bilateral upper and lower extremity rigidity it could be the part of dementia. Patient's wife agreed to try Sinemet. Started Sinemet low-dose 3 times daily, we will continue to monitor if there is any improvement   History of atrial fibrillation Not currently on systemic anticoagulation, no rate control agents Continue telemetry   Coronary artery disease of bypass graft of native heart with stable angina pectoris Troponin was negative at 11 and EKG showed no ischemic changes Continue atorvastatin 5 mg, losartan and nitroglycerin sublingual as needed   Body mass index is 22.73 kg/m.  Interventions:       Diet: Heart healthy diet DVT Prophylaxis: Subcutaneous Lovenox   Advance goals of care discussion: DNR  Family Communication: family was  not present at bedside, at the time of interview.  The pt provided permission to discuss medical plan with the family. Opportunity was given to ask question and all questions were answered satisfactorily.  D/w patient's wife over the phone  Disposition:  Pt is from Home, admitted with Fall and syncope,  still at high risk for fall, which precludes a safe discharge. Discharge to SNF, TBD after PT/OT eval. TOC following for placement, patient is clinically stable, can be discharged when bed will be available.  Subjective: No significant events overnight, patient remained fidgety as per RN but patient is redirectable.  No agitation or behavioral issues noticed. Patient was resting  comfortably, unable to offer any complaints.  Physical Exam: General: NAD, lying comfortably Appear in no distress, affect appropriate Eyes: PERRLA ENT: Oral Mucosa Clear, moist  Neck: no JVD,  Cardiovascular: S1 and S2 Present, no Murmur,  Respiratory: good respiratory effort, Bilateral Air entry equal and Decreased, no Crackles, no wheezes Abdomen: Bowel Sound present, Soft and no tenderness,  Skin: no rashes Extremities: no Pedal edema, no calf tenderness Neurologic: without any new focal findings, rigidity of bilateral upper and lower extremities Gait not checked due to patient safety concerns  Vitals:   11/10/22 0356 11/10/22 0500 11/10/22 0710 11/10/22 0816  BP: (!) 155/98  (!) 190/92 (!) 158/97  Pulse: 73  78 72  Resp: Temp: 97.7 F (36.5 C)  98.1 F (36.7 C) 98.4 F (36.9 C)  TempSrc: Oral   Oral  SpO2: 94%  97% 97%  Weight:  71.8 kg    Height:        Intake/Output Summary (Last 24 hours) at 11/10/2022 1451 Last data filed at 11/10/2022 1039 Gross per 24 hour  Intake 240 ml  Output 450 ml  Net -210 ml   Filed Weights   11/09/22 0212 11/10/22 0500  Weight: 72.6 kg 71.8 kg    Data Reviewed: I have personally reviewed and interpreted daily labs, tele strips, imagings as discussed above. I reviewed all nursing notes, pharmacy notes, vitals, pertinent old records I have discussed plan of care as described above with RN and patient/family.  CBC: Recent Labs  Lab 11/09/22 0103 11/10/22 0336  WBC 10.1 7.9  NEUTROABS 7.4  --   HGB 11.2* 12.0*  HCT 33.9* 35.1*  MCV 101.5* 99.7  PLT 155 170   Basic Metabolic Panel: Recent Labs  Lab 11/09/22 0103 11/10/22 0336  NA 139 140  K 3.6 3.5  CL 108 106  CO2 22 25  GLUCOSE 107* 102*  BUN 32* 23  CREATININE 1.10 0.99  CALCIUM 8.8* 9.0  MG 2.0 2.0  PHOS  --  3.3    Studies: No results found.  Scheduled Meds:  amLODipine  10 mg Oral Daily   atorvastatin  5 mg Oral q1800   carbidopa-levodopa   1 tablet Oral TID   diclofenac Sodium  2 g Topical QID   enoxaparin (LOVENOX) injection  40 mg Subcutaneous Q24H   isosorbide mononitrate  60 mg Oral QPM   losartan  50 mg Oral Daily   pantoprazole  40 mg Oral Daily   QUEtiapine  25 mg Oral BID   sodium chloride flush  3 mL Intravenous Q12H   Continuous Infusions: PRN Meds: acetaminophen **OR** acetaminophen, hydrALAZINE **OR** hydrALAZINE, HYDROcodone-acetaminophen, melatonin, nitroGLYCERIN, ondansetron **OR** ondansetron (ZOFRAN) IV  Time spent: 35 minutes  Author: Gillis Santa. MD [facility] 11/10/2022 2:51 PM  To reach On-call, see care teams to locate the attending and reach out to them via [email or web address]. If 7PM-7AM, please contact night-coverage If you still have difficulty reaching the attending provider, please page the [facility] (Director on Call) for [facility] on amion for assistance.

## 2022-11-10 NOTE — Progress Notes (Addendum)
Mobility Specialist - Progress Note   11/10/22 1500  Mobility  Activity Ambulated with assistance in room  Level of Assistance Moderate assist, patient does 50-74%  Assistive Device Front wheel walker  Distance Ambulated (ft) 8 ft  Activity Response Tolerated well  $Mobility charge 1 Mobility     Pt sleeping in bed upon arrival, utilizing RA. Pt awakened to voice and agreeable to activity. Verbally engaging throughout session but difficult to understand at times. Pt completed bed mobility with maxA---no physical effort from pt. R lateral lean in sitting. STS and ambulation with modA. Heavy verbal/tactile cueing for maintaining hand placement, pt would get distracted and reach out for items like furniture or lines/telebox. Pt very easily distracted and does not follow commands. Author also noted pt began to have tremors while standing with increased forward-flexed posture so further ambulation deferred. Pt pushes RW aside and required B hand held assist to return EOB. Once seated pt washed face with set-up assist. Pt returned supine with maxA. Alarm set, needs in reach, spouse at bedside. RN notified.    Zachary Hall Mobility Specialist 11/10/22, 3:33 PM

## 2022-11-10 NOTE — TOC Initial Note (Signed)
Transition of Care [facility]) - Initial/Assessment Note    Patient Details  Name: Zachary Hall MRN: [id number] Date of Birth: 1934-12-03  [facility] CM/SW Contact:    Margarito Liner, LCSW Phone Number: 11/10/2022, 4:32 PM  Clinical Narrative:   Patient not oriented. Wife at bedside. CSW introduced role and explained that discharge planning would be discussed. Wife is not interested in SNF placement, wanting to honor patient's wishes about staying at home. She is agreeable to home health and has been looking into having someone provide aide/sitter services for him. She has been in contact with a retired Charity fundraiser from [employer]. A family member had Danielle with [employer] contact her. Wife is not interested in personal care services through an agency, wanting consistency with the same person and for them to bond with patient. Wife is redoing their carriage house to be more accessible and safe for patient's needs. Will start home health search. Patient has a hospital bed, lift chair, rollator, 4-wheeled walker, over the bed table, shower stool, and BSC. Wife requesting a two-wheeled walker. Ordered through [employer]. No further concerns. CSW encouraged patient's wife to contact CSW as needed. CSW will continue to follow patient and his wife for support and facilitate return home once stable.               Expected Discharge Plan: Home w Home Health Services Barriers to Discharge: Continued Medical Work up   Patient Goals and CMS Choice            Expected Discharge Plan and Services     Post Acute Care Choice: Home Health, Durable Medical Equipment Living arrangements for the past 2 months: Single Family Home                                      Prior Living Arrangements/Services Living arrangements for the past 2 months: Single Family Home Lives with:: Spouse Patient language and need for interpreter reviewed:: Yes Do you feel safe going back to the place where you live?:  Yes      Need for Family Participation in Patient Care: Yes (Comment) Care giver support system in place?: Yes (comment) Current home services: DME Criminal Activity/Legal Involvement Pertinent to Current Situation/Hospitalization: No - Comment as needed  Activities of Daily Living Home Assistive Devices/Equipment: Environmental consultant (specify type), Cane (specify quad or straight), Hearing aid, Eyeglasses ADL Screening (condition at time of admission) Patient's cognitive ability adequate to safely complete daily activities?: Yes Is the patient deaf or have difficulty hearing?: No Does the patient have difficulty seeing, even when wearing glasses/contacts?: No Does the patient have difficulty concentrating, remembering, or making decisions?: Yes Patient able to express need for assistance with ADLs?: Yes Does the patient have difficulty dressing or bathing?: No Independently performs ADLs?: No Communication: Independent Dressing (OT): Independent Grooming: Independent Feeding: Independent Bathing: Needs assistance Is this a change from baseline?: Change from baseline, expected to last >3 days Toileting: Needs assistance Is this a change from baseline?: Change from baseline, expected to last >3days In/Out Bed: Needs assistance Is this a change from baseline?: Change from baseline, expected to last >3 days Walks in Home: Needs assistance Is this a change from baseline?: Change from baseline, expected to last >3 days Does the patient have difficulty walking or climbing stairs?: Yes Weakness of Legs: Both Weakness of Arms/Hands: None  Permission Sought/Granted Permission sought to share  information with : Facility Medical sales representative, Family Supports    Share Information with NAME: Rockney Ghee  Permission granted to share info w AGENCY: Home Health Agencies  Permission granted to share info w Relationship: Wife  Permission granted to share info w Contact Information:  [phone]  Emotional Assessment Appearance:: Appears stated age Attitude/Demeanor/Rapport: Unable to Assess Affect (typically observed): Unable to Assess Orientation: :  (Disoriented x 4) Alcohol / Substance Use: Not Applicable Psych Involvement: No (comment)  Admission diagnosis:  Syncope and collapse [R55] Weakness generalized [R53.1] Fall, initial encounter [W19.XXXA] Hypertension, unspecified type [I10] Patient Active Problem List   Diagnosis Date Noted   Syncope and collapse 11/09/2022   Hypertensive urgency 11/09/2022   DDD (degenerative disc disease), cervical 10/20/2022   History of renal cell carcinoma s/p partial left nephrectomy 01/2021 08/31/2022   Knee joint stiffness, bilateral 08/31/2022   CKD (chronic kidney disease) stage 3, GFR 30-59 ml/min 06/01/2022   Constipation 06/01/2022   Anemia 05/11/2022   Thrombocytopenia 04/13/2022   Fall 04/13/2022   Unintentional weight loss 02/23/2022   Fatigue 02/23/2022   Balance problem 01/27/2022   Hyperglycemia 01/27/2022   Dementia 01/27/2022   Dementia due to Alzheimer's disease 09/07/2021   Macular pucker, left eye 08/23/2021   History of partial nephrectomy 08/21/2021   Type A blood, Rh positive 08/21/2021   Hyponatremia 08/21/2021   IFG (impaired fasting glucose) 08/21/2021   Low hemoglobin 08/21/2021   Renal mass 02/25/2021   Right epiretinal membrane 02/10/2020   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   Pseudophakia of both eyes 02/10/2020   Posterior vitreous detachment of right eye 02/10/2020   Right inguinal hernia 01/21/2017   Mild cognitive impairment 09/29/2016   Essential hypertension 09/16/2015   Hyperlipidemia 09/16/2015   Stasis dermatitis 05/14/2014   Arteriosclerosis of coronary artery 05/04/2014   RBBB (right bundle branch block) 09/02/2013   H/O coronary artery bypass surgery 12/15/2010   Presence of aortocoronary bypass graft 12/15/2010   Sinus bradycardia with RBBB 11/21/2010    Coronary artery disease of bypass graft of native heart with stable angina pectoris    History of atrial fibrillation    PCP:  Elenore Paddy, NP Pharmacy:   [facility] [phone] [place], [place] - [address] [place] [address] Phone: [phone] Fax: [phone]     Social Determinants of Health (SDOH) Social History: SDOH Screenings   Food Insecurity: No Food Insecurity (11/09/2022)  Housing: Low Risk  (11/09/2022)  Transportation Needs: No Transportation Needs (11/09/2022)  Utilities: Not At Risk (11/09/2022)  Depression (PHQ2-9): Low Risk  (08/31/2022)  Tobacco Use: Medium Risk (11/09/2022)   SDOH Interventions:     Readmission Risk Interventions     No data to display

## 2022-11-11 DIAGNOSIS — G20A1 Parkinson's disease without dyskinesia, without mention of fluctuations: Secondary | ICD-10-CM | POA: Diagnosis present

## 2022-11-11 DIAGNOSIS — R531 Weakness: Secondary | ICD-10-CM | POA: Diagnosis present

## 2022-11-11 DIAGNOSIS — I160 Hypertensive urgency: Secondary | ICD-10-CM | POA: Diagnosis not present

## 2022-11-11 DIAGNOSIS — I452 Bifascicular block: Secondary | ICD-10-CM | POA: Diagnosis present

## 2022-11-11 DIAGNOSIS — I2510 Atherosclerotic heart disease of native coronary artery without angina pectoris: Secondary | ICD-10-CM | POA: Diagnosis present

## 2022-11-11 DIAGNOSIS — R251 Tremor, unspecified: Secondary | ICD-10-CM | POA: Diagnosis not present

## 2022-11-11 DIAGNOSIS — R278 Other lack of coordination: Secondary | ICD-10-CM | POA: Diagnosis not present

## 2022-11-11 DIAGNOSIS — R404 Transient alteration of awareness: Secondary | ICD-10-CM | POA: Diagnosis not present

## 2022-11-11 DIAGNOSIS — Z743 Need for continuous supervision: Secondary | ICD-10-CM | POA: Diagnosis not present

## 2022-11-11 DIAGNOSIS — I480 Paroxysmal atrial fibrillation: Secondary | ICD-10-CM | POA: Diagnosis not present

## 2022-11-11 DIAGNOSIS — R55 Syncope and collapse: Secondary | ICD-10-CM | POA: Diagnosis not present

## 2022-11-11 DIAGNOSIS — E7800 Pure hypercholesterolemia, unspecified: Secondary | ICD-10-CM | POA: Diagnosis present

## 2022-11-11 DIAGNOSIS — E43 Unspecified severe protein-calorie malnutrition: Secondary | ICD-10-CM | POA: Diagnosis not present

## 2022-11-11 DIAGNOSIS — F03918 Unspecified dementia, unspecified severity, with other behavioral disturbance: Secondary | ICD-10-CM | POA: Diagnosis not present

## 2022-11-11 DIAGNOSIS — R339 Retention of urine, unspecified: Secondary | ICD-10-CM | POA: Diagnosis not present

## 2022-11-11 DIAGNOSIS — R2681 Unsteadiness on feet: Secondary | ICD-10-CM | POA: Diagnosis not present

## 2022-11-11 DIAGNOSIS — I25119 Atherosclerotic heart disease of native coronary artery with unspecified angina pectoris: Secondary | ICD-10-CM | POA: Diagnosis not present

## 2022-11-11 DIAGNOSIS — R0689 Other abnormalities of breathing: Secondary | ICD-10-CM | POA: Diagnosis not present

## 2022-11-11 DIAGNOSIS — R2689 Other abnormalities of gait and mobility: Secondary | ICD-10-CM | POA: Diagnosis not present

## 2022-11-11 DIAGNOSIS — Z6822 Body mass index (BMI) 22.0-22.9, adult: Secondary | ICD-10-CM | POA: Diagnosis not present

## 2022-11-11 DIAGNOSIS — I10 Essential (primary) hypertension: Secondary | ICD-10-CM | POA: Diagnosis present

## 2022-11-11 DIAGNOSIS — F05 Delirium due to known physiological condition: Secondary | ICD-10-CM | POA: Diagnosis present

## 2022-11-11 DIAGNOSIS — R001 Bradycardia, unspecified: Secondary | ICD-10-CM | POA: Diagnosis not present

## 2022-11-11 DIAGNOSIS — H1089 Other conjunctivitis: Secondary | ICD-10-CM | POA: Diagnosis present

## 2022-11-11 DIAGNOSIS — M6281 Muscle weakness (generalized): Secondary | ICD-10-CM | POA: Diagnosis not present

## 2022-11-11 DIAGNOSIS — Z741 Need for assistance with personal care: Secondary | ICD-10-CM | POA: Diagnosis not present

## 2022-11-11 DIAGNOSIS — M109 Gout, unspecified: Secondary | ICD-10-CM | POA: Diagnosis present

## 2022-11-11 DIAGNOSIS — G4700 Insomnia, unspecified: Secondary | ICD-10-CM | POA: Diagnosis present

## 2022-11-11 DIAGNOSIS — Z7401 Bed confinement status: Secondary | ICD-10-CM | POA: Diagnosis not present

## 2022-11-11 DIAGNOSIS — Z1152 Encounter for screening for COVID-19: Secondary | ICD-10-CM | POA: Diagnosis not present

## 2022-11-11 DIAGNOSIS — F0280 Dementia in other diseases classified elsewhere without behavioral disturbance: Secondary | ICD-10-CM | POA: Diagnosis present

## 2022-11-11 DIAGNOSIS — F02811 Dementia in other diseases classified elsewhere, unspecified severity, with agitation: Secondary | ICD-10-CM | POA: Diagnosis not present

## 2022-11-11 DIAGNOSIS — Z9181 History of falling: Secondary | ICD-10-CM | POA: Diagnosis not present

## 2022-11-11 DIAGNOSIS — R296 Repeated falls: Secondary | ICD-10-CM | POA: Diagnosis present

## 2022-11-11 DIAGNOSIS — R262 Difficulty in walking, not elsewhere classified: Secondary | ICD-10-CM | POA: Diagnosis not present

## 2022-11-11 DIAGNOSIS — D649 Anemia, unspecified: Secondary | ICD-10-CM | POA: Diagnosis present

## 2022-11-11 DIAGNOSIS — Z66 Do not resuscitate: Secondary | ICD-10-CM | POA: Diagnosis present

## 2022-11-11 DIAGNOSIS — K219 Gastro-esophageal reflux disease without esophagitis: Secondary | ICD-10-CM | POA: Diagnosis present

## 2022-11-11 LAB — BASIC METABOLIC PANEL
Anion gap: 9 (ref 5–15)
BUN: 25 mg/dL — ABNORMAL HIGH (ref 8–23)
CO2: 22 mmol/L (ref 22–32)
Calcium: 8.8 mg/dL — ABNORMAL LOW (ref 8.9–10.3)
Chloride: 106 mmol/L (ref 98–111)
Creatinine, Ser: 1.16 mg/dL (ref 0.61–1.24)
GFR, Estimated: >60 mL/min (ref >60)
Glucose, Bld: 114 mg/dL — ABNORMAL HIGH (ref 70–99)
Potassium: 3.4 mmol/L — ABNORMAL LOW (ref 3.5–5.1)
Sodium: 137 mmol/L (ref 135–145)

## 2022-11-11 LAB — CBC
HCT: 31.9 % — ABNORMAL LOW (ref 39.0–52.0)
Hemoglobin: 10.8 g/dL — ABNORMAL LOW (ref 13.0–17.0)
MCH: 34.1 pg — ABNORMAL HIGH (ref 26.0–34.0)
MCHC: 33.9 g/dL (ref 30.0–36.0)
MCV: 100.6 fL — ABNORMAL HIGH (ref 80.0–100.0)
Platelets: 173 10*3/uL (ref 150–400)
RBC: 3.17 MIL/uL — ABNORMAL LOW (ref 4.22–5.81)
RDW: 12.9 % (ref 11.5–15.5)
WBC: 9.4 10*3/uL (ref 4.0–10.5)
nRBC: 0 % (ref 0.0–0.2)

## 2022-11-11 LAB — MAGNESIUM: Magnesium: 2 mg/dL (ref 1.7–2.4)

## 2022-11-11 LAB — GLUCOSE, CAPILLARY: Glucose-Capillary: 115 mg/dL — ABNORMAL HIGH (ref 70–99)

## 2022-11-11 LAB — PHOSPHORUS: Phosphorus: 3.6 mg/dL (ref 2.5–4.6)

## 2022-11-11 MED ORDER — CHLORHEXIDINE GLUCONATE CLOTH 2 % EX PADS
6.0000 | MEDICATED_PAD | Freq: Every day | CUTANEOUS | Status: DC
  Administered 2022-11-11 – 2022-11-14 (×4): 6 via TOPICAL

## 2022-11-11 MED ORDER — POTASSIUM CHLORIDE CRYS ER 20 MEQ PO TBCR
40.0000 meq | EXTENDED_RELEASE_TABLET | Freq: Once | ORAL | Status: AC
  Administered 2022-11-11: 40 meq via ORAL
  Filled 2022-11-11: qty 2

## 2022-11-11 MED ORDER — CHLORHEXIDINE GLUCONATE CLOTH 2 % EX PADS: 6.0000 | MEDICATED_PAD | Freq: Every day | CUTANEOUS | Status: DC

## 2022-11-11 MED ORDER — TAMSULOSIN HCL 0.4 MG PO CAPS
0.4000 mg | ORAL_CAPSULE | Freq: Every day | ORAL | Status: DC
  Administered 2022-11-11 – 2022-11-13 (×3): 0.4 mg via ORAL
  Filled 2022-11-11 (×3): qty 1

## 2022-11-11 NOTE — Progress Notes (Signed)
Bladder scan . Gillis Santa, MD notified. See new order for foley placement.

## 2022-11-11 NOTE — TOC Progression Note (Signed)
Transition of Care [facility]) - Progression Note    Patient Details  Name: Zachary Hall MRN: [id number] Date of Birth: 01/03/1935  Transition of Care [facility]) CM/SW Contact  Bing Quarry, RN Phone Number: 11/11/2022, 3:58 PM  Clinical Narrative: 11/11/22: Cherlyn Roberts #pending. Requested documents uploaded to [facility] Must. ER#[id number] Gabriel Cirri RN CM       Expected Discharge Plan: Home w Home Health Services Barriers to Discharge: Continued Medical Work up  Expected Discharge Plan and Services     Post Acute Care Choice: Home Health, Durable Medical Equipment Living arrangements for the past 2 months: Single Family Home                 DME Arranged: Walker rolling DME Agency: [facility]       HH Arranged: PT, OT, Respirator Therapy, Social Work [employer] Agency: [facility] Date HH Agency Contacted: 11/11/22 Time HH Agency Contacted: 0940 Representative spoke with at [facility] Agency: Novaleigh Kohlman Cower   Social Determinants of Health (SDOH) Interventions SDOH Screenings   Food Insecurity: No Food Insecurity (11/09/2022)  Housing: Low Risk  (11/09/2022)  Transportation Needs: No Transportation Needs (11/09/2022)  Utilities: Not At Risk (11/09/2022)  Depression (PHQ2-9): Low Risk  (08/31/2022)  Tobacco Use: Medium Risk (11/09/2022)    Readmission Risk Interventions     No data to display

## 2022-11-11 NOTE — Plan of Care (Signed)

## 2022-11-11 NOTE — TOC Progression Note (Signed)
Transition of Care [facility]) - Progression Note    Patient Details  Name: Zachary Hall MRN: [id number] Date of Birth: [birth year]  Transition of [facility]) CM/SW Contact  Bing Quarry, RN Phone Number: 11/11/2022, 11:27 AM  Clinical Narrative:  11/11/22: Attempting to reach spouse to discuss discharge plan follow up after CM discussion on 11/10/22 where spouse stated wish to take patient home instead of SNF placement. Start of service will not be until around Tuesday if agency accepts. This information was given to provider and in turn provider deferred to speaking with spouse regarding other care in place. Left VM x 2 on home home, no answer in room or cell home. Unit RN requested to have spouse call RN CM and she reported spouse indicated to call her at home number, which RN CM did again with no answer and VM left with call back request and call back number provided.  [facility] pending acceptance for [facility] PT/OT/HH aide/SW but no Charity fundraiser available for [employer]. Durnig this a new Foley catheter was placed per provider order after scan indicated retaining residual >700 ml. Will continue to reach out to spouse regarding discharge planning with [facility]. Gabriel Cirri RN CM 1130 am. [phone].    Expected Discharge Plan: Home w Home Health Services Barriers to Discharge: Continued Medical Work up  Expected Discharge Plan and Services     Post Acute Care Choice: Home Health, Durable Medical Equipment Living arrangements for the past 2 months: Single Family Home                 DME Arranged: Walker rolling DME Agency: [facility]       HH Arranged: PT, OT, Respirator Therapy, Social Work HH Agency: [facility] Date HH Agency Contacted: 11/11/22 Time HH Agency Contacted: 0940 Representative spoke with at [facility] Agency: Moselle Rister Cower   Social Determinants of Health (SDOH) Interventions SDOH Screenings   Food Insecurity: No Food Insecurity (11/09/2022)  Housing: Low Risk  (11/09/2022)   Transportation Needs: No Transportation Needs (11/09/2022)  Utilities: Not At Risk (11/09/2022)  Depression (PHQ2-9): Low Risk  (08/31/2022)  Tobacco Use: Medium Risk (11/09/2022)    Readmission Risk Interventions     No data to display

## 2022-11-11 NOTE — TOC Progression Note (Signed)
Transition of Care [facility] - Progression Note    Patient Details  Name: Zachary Hall MRN: [id number] Date of Birth: May 15, 1935  Transition of Care [facility]) CM/SW Contact  Bing Quarry, RN Phone Number: 11/11/2022, 2:42 PM  Clinical Narrative:  11/11/22: RN CM was able to speak at length to spouse by phone from her home regarding discharge plan. After speaking with her it became relevant that she has not seen therapy with patient to see what is involved in his mobility and care needs though she desires to take him home. She has back and eye issues herself. Reviewed PT notes with spouse in terms of what would be needed from her in assistance and supervision. She is working on some extra help but only has someone that can come sporadically when she has appointments, not on a daily basis. It would assist her in being present when therapy sees patient to gain better understanding of what is involved to take patient home with home health services. There are some adaptation/accommodations she is having done at home are not ready. She plans to move her and patient into a one level guest/carriage house that is being handicap adapted. This will take several weeks. Spouse also has medical needs she will need to leave patient to have completed. As the discussion evolved and RN CM clarified, spouse also wishes to reconsider STR/SNF at [facility] or [facility]. Updated provider and will start bed search. Gabriel Cirri RN CM     Expected Discharge Plan: Home w Home Health Services Barriers to Discharge: Continued Medical Work up  Expected Discharge Plan and Services     Post Acute Care Choice: Home Health, Durable Medical Equipment Living arrangements for the past 2 months: Single Family Home                 DME Arranged: Walker rolling DME Agency: [facility]       HH Arranged: PT, OT, Respirator Therapy, Social Work HH Agency: [facility] Date HH Agency Contacted:  11/11/22 Time HH Agency Contacted: 0940 Representative spoke with at [facility] Agency: Tenessa Marsee Cower   Social Determinants of Health (SDOH) Interventions SDOH Screenings   Food Insecurity: No Food Insecurity (11/09/2022)  Housing: Low Risk  (11/09/2022)  Transportation Needs: No Transportation Needs (11/09/2022)  Utilities: Not At Risk (11/09/2022)  Depression (PHQ2-9): Low Risk  (08/31/2022)  Tobacco Use: Medium Risk (11/09/2022)    Readmission Risk Interventions     No data to display

## 2022-11-11 NOTE — Progress Notes (Signed)
[facility] Progress Note  Patient: Zachary Hall    ZOX:[id number]  DOA: 11/09/2022     Date of Service: the patient was seen and examined on 11/11/2022  Chief Complaint  Patient presents with   Fall   Brief hospital course: Zachary Hall is a 88 y.o. male with medical history significant for CAD s/p CABG, dementia, left renal carcinoma s/p partial left nephrectomy 01/2021, RBBB with sinus bradycardia and no indication for pacemaker (per review of cardiology note from 11/02/2022), remote history of paroxysmal A-fib, who was brought to the ED after a fall in the bathtub.  Wife at the bedside who gives most of the history says she heard a thud and when she rushed to the bathroom found him halfway in, halfway out of bathtub.  He was awake and alert and did not appear to have injuries.  Patient had fallen 3 times the day prior.  Wife is concerned that in the previous days he was constipated and she gave him a laxative and he had a few episodes of loose stool to where she had to place him in a diaper.  Otherwise he had been in his usual state of health. ED course and data review: BP 216/79, with bradycardia at 254 and otherwise normal vitals.  Labs unremarkable except for baseline anemia of 11.2.  CK normal at 86. CT head and C-spine nonacute.Clear  Chest x-ray and pelvic x-ray nonacute. EKG, personally viewed and interpreted shows sinus bradycardia at 60 with RBBB and LAFB.  Patient was treated with a 500 mL sodium chloride bolus, given hydralazine 5 mg IV for BP with improvement 158/87 by admission. Hospitalist consulted for admission.    Assessment and Plan: * Syncope and collapse  Sinus bradycardia with RBBB No evidence of injury on imaging studies Workup unrevealing Some concern for symptomatic bradycardia as etiology of falls Continuous cardiac monitoring, echocardiogram Received a 1 L NS bolus in the ED Neurologic checks fall and aspiration precautions Avoid AV nodal agents for  BP control   Hypertensive urgency SBP over 200 on arrival, improving after IV hydralazine 5 mg suspect in part related to pain Continue losartan 50 and amlodipine 10 mg POD, home dose 4/12 started Imdur 60 mg p.o. every evening Monitor BP and titrate medication accordingly   History of renal cell carcinoma s/p partial left nephrectomy 01/2021 No acute disused suspected   Dementia and acute delirium Not on any medication Delirium precautions Patient has never been diagnosed with Parkinson disease but patient wife did notice resting tremors.  On exam patient has bilateral upper and lower extremity rigidity it could be the part of dementia. Patient's wife agreed to try Sinemet. Started Sinemet low-dose 3 times daily, we will continue to monitor if there is any improvement started Seroquel 50 mg p.o. twice daily  History of atrial fibrillation Not currently on systemic anticoagulation, no rate control agents Continue telemetry   Coronary artery disease of bypass graft of native heart with stable angina pectoris Troponin was negative at 11 and EKG showed no ischemic changes Continue atorvastatin 5 mg, losartan and nitroglycerin sublingual as needed   Urinary retention, Foley catheter was inserted on 4/13, bladder scan showed >700 mL urine.  Started Flomax Patient is to follow with urology as an outpatient for voiding trial after 1 week   Body mass index is 22.73 kg/m.  Interventions:       Diet: Heart healthy diet DVT Prophylaxis: Subcutaneous Lovenox   Advance goals of care discussion: DNR  Family Communication: family was present at bedside, at the time of interview.  The pt provided permission to discuss medical plan with the family. Opportunity was given to ask question and all questions were answered satisfactorily.  D/w patient's wife over the phone  Disposition:  Pt is from Home, admitted with Fall and syncope, still at high risk for fall, which precludes a safe  discharge.  Urinary retention, Foley catheter was inserted today.  Patient's condition is gradually improving, started Sinemet new medication so patient's wife would like to monitor tonight and then discharge tomorrow a.m. Discharge to [facility] with [facility] as per PT/OT eval. TOC is following, home health has been arranged which will start on Tuesday, 11/14/2022   Subjective: Yesterday whole day patient was very agitated and fidgety, developed sundowning in the afternoon, Seroquel dose was increased which helped and patient finally got good sleep as per patient's wife.  She would like to keep him today to see the response to treatment and plan to discharge tomorrow a.m.  Patient's wife is not comfortable to discharge him today. Bladder scan showed urinary retention, Foley catheter was inserted today.  Physical Exam: General: NAD, lying comfortably Appear in no distress, affect appropriate Eyes: PERRLA ENT: Oral Mucosa Clear, moist  Neck: no JVD,  Cardiovascular: S1 and S2 Present, no Murmur,  Respiratory: good respiratory effort, Bilateral Air entry equal and Decreased, no Crackles, no wheezes Abdomen: Bowel Sound present, Soft and no tenderness,  Skin: no rashes Extremities: no Pedal edema, no calf tenderness Neurologic: without any new focal findings, rigidity of bilateral upper and lower extremities seems improved today Gait not checked due to patient safety concerns  Vitals:   11/10/22 2119 11/11/22 0543 11/11/22 0547 11/11/22 0829  BP: (!) 154/89 116/68  117/70  Pulse: 80 63  63  Resp: 20 18    Temp: 97.9 F (36.6 C) 98.2 F (36.8 C)  (!) 97.5 F (36.4 C)  TempSrc:    Oral  SpO2: 96% 95%  95%  Weight:   55.4 kg   Height:        Intake/Output Summary (Last 24 hours) at 11/11/2022 1256 Last data filed at 11/11/2022 1200 Gross per 24 hour  Intake 303 ml  Output 1150 ml  Net -847 ml   Filed Weights   11/09/22 0212 11/10/22 0500 11/11/22 0547  Weight: 72.6 kg 71.8 kg 55.4 kg     Data Reviewed: I have personally reviewed and interpreted daily labs, tele strips, imagings as discussed above. I reviewed all nursing notes, pharmacy notes, vitals, pertinent old records I have discussed plan of care as described above with RN and patient/family.  CBC: Recent Labs  Lab 11/09/22 0103 11/10/22 0336 11/11/22 0416  WBC 10.1 7.9 9.4  NEUTROABS 7.4  --   --   HGB 11.2* 12.0* 10.8*  HCT 33.9* 35.1* 31.9*  MCV 101.5* 99.7 100.6*  PLT 155 170 173   Basic Metabolic Panel: Recent Labs  Lab 11/09/22 0103 11/10/22 0336 11/11/22 0416  NA 139 140 137  K 3.6 3.5 3.4*  CL 108 106 106  CO2 22 25 22   GLUCOSE 107* 102* 114*  BUN 32* 23 25*  CREATININE 1.10 0.99 1.16  CALCIUM 8.8* 9.0 8.8*  MG 2.0 2.0 2.0  PHOS  --  3.3 3.6    Studies: No results found.  Scheduled Meds:  amLODipine  10 mg Oral Daily   atorvastatin  5 mg Oral q1800   carbidopa-levodopa  1 tablet Oral TID   [  START ON 11/12/2022] Chlorhexidine Gluconate Cloth  6 each Topical Q0600   diclofenac Sodium  2 g Topical QID   enoxaparin (LOVENOX) injection  40 mg Subcutaneous Q24H   isosorbide mononitrate  60 mg Oral QPM   losartan  50 mg Oral Daily   pantoprazole  40 mg Oral Daily   QUEtiapine  50 mg Oral BID   sodium chloride flush  3 mL Intravenous Q12H   Continuous Infusions: PRN Meds: acetaminophen **OR** acetaminophen, hydrALAZINE **OR** hydrALAZINE, HYDROcodone-acetaminophen, melatonin, nitroGLYCERIN, ondansetron **OR** ondansetron (ZOFRAN) IV  Time spent: 35 minutes  Author: Gillis Santa. MD [facility] 11/11/2022 12:56 PM  To reach On-call, see care teams to locate the attending and reach out to them via [email or web address]. If 7PM-7AM, please contact night-coverage If you still have difficulty reaching the attending provider, please page the [facility] (Director on Call) for [facility] on amion for assistance.

## 2022-11-11 NOTE — [place] FL2 (Signed)
[place] MEDICAID FL2 LEVEL OF CARE FORM     IDENTIFICATION  Patient Name: Zachary Hall Birthdate: January 01, 1935 Sex: male Admission Date (Current Location): 11/09/2022  [facility] and [place] Number:  Chiropodist and Address:  [facility], [address] [place], [place] [address]      Provider Number: [id number]  Attending Physician Name and Address:  Gillis Santa, MD  Relative Name and Phone Number:  Rockney Ghee (Spouse) [phone] (Home Phone)    Current Level of Care: Hospital Recommended Level of Care: Skilled Nursing Facility Prior Approval Number:    Date Approved/Denied:   PASRR Number:  (Pending additional information.)  Discharge Plan: SNF    Current Diagnoses: Patient Active Problem List   Diagnosis Date Noted   Syncope and collapse 11/09/2022   Hypertensive urgency 11/09/2022   DDD (degenerative disc disease), cervical 10/20/2022   History of renal cell carcinoma s/p partial left nephrectomy 01/2021 08/31/2022   Knee joint stiffness, bilateral 08/31/2022   CKD (chronic kidney disease) stage 3, GFR 30-59 ml/min 06/01/2022   Constipation 06/01/2022   Anemia 05/11/2022   Thrombocytopenia 04/13/2022   Fall 04/13/2022   Unintentional weight loss 02/23/2022   Fatigue 02/23/2022   Balance problem 01/27/2022   Hyperglycemia 01/27/2022   Dementia 01/27/2022   Dementia due to Alzheimer's disease 09/07/2021   Macular pucker, left eye 08/23/2021   History of partial nephrectomy 08/21/2021   Type A blood, Rh positive 08/21/2021   Hyponatremia 08/21/2021   IFG (impaired fasting glucose) 08/21/2021   Low hemoglobin 08/21/2021   Renal mass 02/25/2021   Right epiretinal membrane 02/10/2020   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   Pseudophakia of both eyes 02/10/2020   Posterior vitreous detachment of right eye 02/10/2020   Right inguinal hernia 01/21/2017   Mild cognitive impairment 09/29/2016   Essential  hypertension 09/16/2015   Hyperlipidemia 09/16/2015   Stasis dermatitis 05/14/2014   Arteriosclerosis of coronary artery 05/04/2014   RBBB (right bundle branch block) 09/02/2013   H/O coronary artery bypass surgery 12/15/2010   Presence of aortocoronary bypass graft 12/15/2010   Sinus bradycardia with RBBB 11/21/2010   Coronary artery disease of bypass graft of native heart with stable angina pectoris    History of atrial fibrillation     Orientation RESPIRATION BLADDER Height & Weight     Self  Normal Indwelling catheter Weight: 55.4 kg Height:   (177.8 cm)  BEHAVIORAL SYMPTOMS/MOOD NEUROLOGICAL BOWEL NUTRITION STATUS      Incontinent Diet  AMBULATORY STATUS COMMUNICATION OF NEEDS Skin   Extensive Assist Verbally (But garbled at times.) Normal                       Personal Care Assistance Level of Assistance  Bathing, Feeding, Dressing Bathing Assistance: Maximum assistance Feeding assistance: Maximum assistance Dressing Assistance: Maximum assistance     Functional Limitations Info  Sight          SPECIAL CARE FACTORS FREQUENCY  PT (By licensed PT), OT (By licensed OT)     PT Frequency: 5x/week OT Frequency: 5x/week            Contractures Contractures Info: Not present    Additional Factors Info  Code Status, Allergies Code Status Info: DNR Allergies Info: Lexapro (Escitalopram), Namenda (Memantine), Lisinopril as of 11/11/22           Current Medications (11/11/2022):  This is the current hospital active medication list Current Facility-Administered Medications  Medication  Dose Route Frequency Provider Last Rate Last Admin   acetaminophen (TYLENOL) tablet 650 mg  650 mg Oral Q6H PRN Andris Baumann, MD       Or   acetaminophen (TYLENOL) suppository 650 mg  650 mg Rectal Q6H PRN Andris Baumann, MD       amLODipine (NORVASC) tablet 10 mg  10 mg Oral Daily Lindajo Royal V, MD   10 mg at 11/11/22 0958   atorvastatin (LIPITOR) tablet 5 mg  5  mg Oral q1800 Andris Baumann, MD   5 mg at 11/10/22 2225   carbidopa-levodopa (SINEMET IR) 10-100 MG per tablet immediate release 1 tablet  1 tablet Oral TID Gillis Santa, MD   1 tablet at 11/11/22 1025   Chlorhexidine Gluconate Cloth 2 % PADS 6 each  6 each Topical Q0600 Gillis Santa, MD       diclofenac Sodium (VOLTAREN) 1 % topical gel 2 g  2 g Topical QID Gillis Santa, MD   2 g at 11/11/22 0959   enoxaparin (LOVENOX) injection 40 mg  40 mg Subcutaneous Q24H Lindajo Royal V, MD   40 mg at 11/11/22 0957   hydrALAZINE (APRESOLINE) injection 10 mg  10 mg Intravenous Q6H PRN Gillis Santa, MD       Or   hydrALAZINE (APRESOLINE) tablet 50 mg  50 mg Oral Q6H PRN Gillis Santa, MD       HYDROcodone-acetaminophen (NORCO/VICODIN) 5-325 MG per tablet 1-2 tablet  1-2 tablet Oral Q4H PRN Andris Baumann, MD       isosorbide mononitrate (IMDUR) 24 hr tablet 60 mg  60 mg Oral QPM Gillis Santa, MD   60 mg at 11/10/22 2346   losartan (COZAAR) tablet 50 mg  50 mg Oral Daily Lindajo Royal V, MD   50 mg at 11/11/22 0959   melatonin tablet 5 mg  5 mg Oral QHS PRN Andris Baumann, MD   5 mg at 11/10/22 2347   nitroGLYCERIN (NITROSTAT) SL tablet 0.4 mg  0.4 mg Sublingual Q5 min PRN Andris Baumann, MD       ondansetron [facility]) tablet 4 mg  4 mg Oral Q6H PRN Andris Baumann, MD       Or   ondansetron [facility]) injection 4 mg  4 mg Intravenous Q6H PRN Andris Baumann, MD       pantoprazole (PROTONIX) EC tablet 40 mg  40 mg Oral Daily Lindajo Royal V, MD   40 mg at 11/11/22 0959   QUEtiapine (SEROQUEL) tablet 50 mg  50 mg Oral BID Gillis Santa, MD   50 mg at 11/11/22 0959   sodium chloride flush (NS) 0.9 % injection 3 mL  3 mL Intravenous Q12H Andris Baumann, MD   3 mL at 11/11/22 0959   tamsulosin (FLOMAX) capsule 0.4 mg  0.4 mg Oral QPC supper Gillis Santa, MD         Discharge Medications: Please see discharge summary for a list of discharge medications.  Relevant Imaging Results:  Relevant Lab  Results:   Additional Information SS# [id number]  Bing Quarry, RN

## 2022-11-11 NOTE — Progress Notes (Signed)
Occupational Therapy Treatment Patient Details Name: WOODLEY PETZOLD MRN: [id number] DOB: 18-Jan-1935 Today's Date: 11/11/2022   History of present illness 88 y.o. male with medical history significant for CAD s/p CABG, dementia, left renal carcinoma s/p partial left nephrectomy 01/2021, RBBB with sinus bradycardia and no indication for pacemaker (per review of cardiology note from 11/02/2022), remote history of paroxysmal A-fib, who was brought to the ED after a fall in the bathtub.  Wife at the bedside who gives most of the history says she heard a thud and when she rushed to the bathroom found him halfway in, halfway out of bathtub.  He was awake and alert and did not appear to have injuries.  Patient had fallen 3 times the day prior.  Wife is concerned that in the previous days he was constipated and she gave him a laxative and he had a few episodes of loose stool to where she had to place him in a diaper.  Otherwise he had been in his usual state of health.   OT comments  Patient received in bed. Pt very restless and confused this date, however, he was able to tell therapist his name. Pt found to have incontinent BM in bed. He required Total A for rolling L<>R and for posterior hygiene. Further mobility deferred due to inability to follow single step commands. He required constant VC for redirection, safety awareness, initiation, and sequencing t/o session this date. Pt left as received with all needs in reach. D/C recommendation remains appropriate. OT will continue to follow acutely.    Recommendations for follow up therapy are one component of a multi-disciplinary discharge planning process, led by the attending physician.  Recommendations may be updated based on patient status, additional functional criteria and insurance authorization.    Assistance Recommended at Discharge Frequent or constant Supervision/Assistance  Patient can return home with the following  A lot of help with walking and/or  transfers;A lot of help with bathing/dressing/bathroom;Assistance with cooking/housework;Assist for transportation;Help with stairs or ramp for entrance   Equipment Recommendations  Other (comment) (defer to next venue of care)    Recommendations for Other Services      Precautions / Restrictions Precautions Precautions: Fall Restrictions Weight Bearing Restrictions: No       Mobility Bed Mobility Overal bed mobility: Needs Assistance Bed Mobility: Rolling Rolling: Total assist         General bed mobility comments: Pt required total A for rolling L <> R while OT completing hygiene and NT assisting with bed linen change. +2 to scoot pt up toward [facility]    Transfers Overall transfer level: Needs assistance       General transfer comment: deferred due to poor cognition     Balance Overall balance assessment: Needs assistance     Sitting balance - Comments: deferred due to poor cognition       ADL either performed or assessed with clinical judgement   ADL Overall ADL's : Needs assistance/impaired     Toileting- Clothing Manipulation and Hygiene: Bed level;Total assistance Toileting - Clothing Manipulation Details (indicate cue type and reason): for posterior hygiene after incontinent BM in bed       General ADL Comments: NT present to assist with linen change due to poor cognition and pt being restless    Extremity/Trunk Assessment Upper Extremity Assessment Upper Extremity Assessment: Generalized weakness   Lower Extremity Assessment Lower Extremity Assessment: Generalized weakness        Vision Patient Visual Report: No change from  baseline     Perception     Praxis      Cognition Arousal/Alertness: Awake/alert Behavior During Therapy: Restless Overall Cognitive Status: History of cognitive impairments - at baseline       General Comments: Very confused this date (referring to objects that were clearly not in room), was able to tell therapist  his name, perseverating on certain questions, unable to follow commands, safety mitts in place        Exercises      Shoulder Instructions       General Comments      Pertinent Vitals/ Pain       Pain Assessment Pain Assessment: Faces Faces Pain Scale: No hurt  Home Living          Prior Functioning/Environment              Frequency  Min 2X/week        Progress Toward Goals  OT Goals(current goals can now be found in the care plan section)  Progress towards OT goals: OT to reassess next treatment  Acute Rehab OT Goals Patient Stated Goal: to decrease falls and get me (pt's wife) some help OT Goal Formulation: With family Time For Goal Achievement: 11/23/22 Potential to Achieve Goals: Fair  Plan Discharge plan remains appropriate;Frequency remains appropriate    Co-evaluation                 AM-PAC OT "6 Clicks" Daily Activity     Outcome Measure   Help from another person eating meals?: A Lot Help from another person taking care of personal grooming?: A Lot Help from another person toileting, which includes using toliet, bedpan, or urinal?: Total Help from another person bathing (including washing, rinsing, drying)?: A Lot Help from another person to put on and taking off regular upper body clothing?: A Lot Help from another person to put on and taking off regular lower body clothing?: A Lot 6 Click Score: 11    End of Session    OT Visit Diagnosis: Unsteadiness on feet (R26.81);Repeated falls (R29.6);Muscle weakness (generalized) (M62.81)   Activity Tolerance Other (comment) (cognition)   Patient Left in bed;with call bell/phone within reach;with bed alarm set   Nurse Communication Mobility status;Other (comment) (incontinent BM in bed)        Time: 1610-9604 OT Time Calculation (min): 22 min  Charges: OT General Charges $OT Visit: 1 Visit OT Treatments $Self Care/Home Management : 8-22 mins  [facility] MS,  OTR/L ascom [phone]  11/11/22, 5:42 PM

## 2022-11-12 DIAGNOSIS — R55 Syncope and collapse: Secondary | ICD-10-CM | POA: Diagnosis not present

## 2022-11-12 MED ORDER — POTASSIUM CHLORIDE CRYS ER 20 MEQ PO TBCR: 40.0000 meq | EXTENDED_RELEASE_TABLET | Freq: Once | ORAL | Status: DC

## 2022-11-12 MED ORDER — MELATONIN 5 MG PO TABS
5.0000 mg | ORAL_TABLET | Freq: Every day | ORAL | Status: DC
  Administered 2022-11-12: 5 mg via ORAL
  Filled 2022-11-12: qty 1

## 2022-11-12 NOTE — Evaluation (Signed)
Clinical/Bedside Swallow Evaluation Patient Details  Name: Zachary Hall MRN: [id number] Date of Birth: 10-07-1934  Today's Date: 11/12/2022 Time: SLP Start Time (ACUTE ONLY): 1123 SLP Stop Time (ACUTE ONLY): 1147 SLP Time Calculation (min) (ACUTE ONLY): 24 min  Past Medical History:  Past Medical History:  Diagnosis Date   Arthritis    Balance problem    Barrett esophagus    Bradycardia    Coronary artery disease    GERD (gastroesophageal reflux disease)    Gout    History of atrial fibrillation    Previously on amiodarone   Hx of CABG    Hypercholesterolemia    Hypertension    Left renal mass    Melanoma    Left shoulder blade   Nocturia    Right epiretinal membrane 02/10/2020   Past Surgical History:  Past Surgical History:  Procedure Laterality Date   CARDIAC CATHETERIZATION  01/04/2010   COLONOSCOPY     CORONARY ARTERY BYPASS GRAFT  02/07/2010   LIMA to LAD, SVG to 2nd DX, SVG to OM   EYE SURGERY Bilateral    Cataract Extraction   HEMORRHOID SURGERY     x 2   HEMORRHOID SURGERY N/A 11/13/2012   Procedure: PPH Hemorrhoidectomy;  Surgeon: Robyne Askew, MD;  Location: [facility] OR;  Service: General;  Laterality: N/A;   INGUINAL HERNIA REPAIR Right 01/22/2017   Procedure: RIGHT INGUINAL HERNIA REPAIR WITH MESH;  Surgeon: Darnell Level, MD;  Location: [facility] OR;  Service: General;  Laterality: Right;   INSERTION OF MESH Right 01/22/2017   Procedure: INSERTION OF MESH;  Surgeon: Darnell Level, MD;  Location: [facility] OR;  Service: General;  Laterality: Right;   IR RADIOLOGIST EVAL & MGMT  01/06/2021   NASAL POLYP EXCISION     OTHER SURGICAL HISTORY  06/05/2002   Penile Implant   ROBOTIC ASSITED PARTIAL NEPHRECTOMY Left 02/25/2021   Procedure: XI ROBOTIC ASSITED PARTIAL NEPHRECTOMY;  Surgeon: Rene Paci, MD;  Location: [facility] ORS;  Service: Urology;  Laterality: Left;   UPPER GI ENDOSCOPY     HPI:  Per H&P "Zachary Hall is a 88 y.o. male with medical history  significant for CAD s/p CABG, dementia, left renal carcinoma s/p partial left nephrectomy 01/2021, RBBB with sinus bradycardia and no indication for pacemaker (per review of cardiology note from 11/02/2022), remote history of paroxysmal A-fib, who was brought to the ED after a fall in the bathtub.  Wife at the bedside who gives most of the history says she heard a thud and when she rushed to the bathroom found him halfway in, halfway out of bathtub.  He was awake and alert and did not appear to have injuries.  Patient had fallen 3 times the day prior.  Wife is concerned that in the previous days he was constipated and she gave him a laxative and he had a few episodes of loose stool to where she had to place him in a diaper.  Otherwise he had been in his usual state of health.  ED course and data review: BP 216/79, with bradycardia at 254 and otherwise normal vitals.  Labs unremarkable except for baseline anemia of 11.2.  CK normal at 86.  CT head and C-spine nonacute.Clear  Chest x-ray and pelvic x-ray nonacute.  EKG, personally viewed and interpreted shows sinus bradycardia at 60 with RBBB and LAFB.  Patient was treated with a 500 mL sodium chloride bolus, given hydralazine 5 mg IV for BP with improvement 158/87  by admission.  Hospitalist consulted for admission."    Assessment / Plan / Recommendation  Clinical Impression  Pt seen for clincial swallowing evaluation. Pt alert. Confusion evident. Speech is nonsensical and perseverative. Easily distracted. Appears to be visually hallucinating. B mitts donned for evaluation. Wife at bedside who noted pt "gets choked easily" for the last ~6 months and needs assistance with feeding PTA. Wife reports pt with reduced PO intake recently and that pt consumes x2 Ensures a day. Cleared with RN.   Per chart review, temp and WBC WNL. CXR on admission negative. Pt on room air.  Pt given trials of thin liquids (via straw), puree, and solid. Pt with refusal-type behavior with  solids in which pt held anteriorly between lips and expectorated despite cueing. Per RN, pt with prolonged mastication of regular solids this AM. No overt s/sx pharyngeal dysphagia noted across trials. Pt with tendency to take several sequential sips of thin liquids via straw. Pt benefited from pinched straw to reduced rate/volume of intake.   Pt is at increased risk for aspiration/aspiration PNA given mental status/cognitive decline, dependence for feeding, and multiple medical comorbidities.   Discussed diet options, SLP observations, changes to swallow function in setting of dementia, diet recommendations, safe swallowing strategies/aspiration precautions, and SLP POC with wife. Pt's noted wanting to focus on quality of life for her husband and did not appear interested in greatly modifying pt's diet. RN also made aware of results, recommendations, and SLP POC.   Based on today's evaluation, recommend a mech soft diet with thin liquids and safe swallowing strategies/aspiration precautions as outlined below.   SLP to f/u per POC for diet tolerance and further education with pt's wife.  SLP Visit Diagnosis: Dysphagia, oropharyngeal phase (R13.12)    Aspiration Risk  Mild aspiration risk;Moderate aspiration risk    Diet Recommendation Dysphagia 3 (Mech soft);Thin liquid   Liquid Administration via: Cup;Straw (pinched straw) Medication Administration: Crushed with puree Supervision: Staff to assist with self feeding;Full supervision/cueing for compensatory strategies Compensations: Minimize environmental distractions;Slow rate;Small sips/bites (pinch straw to limit rate/volume of intake)    Other  Recommendations Recommended Consults:  (RD f/u) Oral Care Recommendations: Oral care QID;Staff/trained caregiver to provide oral care    Recommendations for follow up therapy are one component of a multi-disciplinary discharge planning process, led by the attending physician.  Recommendations may be  updated based on patient status, additional functional criteria and insurance authorization.  Follow up Recommendations Skilled nursing-short term rehab (<3 hours/day)      Assistance Recommended at Discharge    Functional Status Assessment Patient has had a recent decline in their functional status and demonstrates the ability to make significant improvements in function in a reasonable and predictable amount of time.  Frequency and Duration min 2x/week  2 weeks       Prognosis Prognosis for improved oropharyngeal function: Guarded Barriers to Reach Goals: Cognitive deficits;Time post onset;Severity of deficits      Swallow Study   General Date of Onset: 11/09/22 (admit date) HPI: Per H&P "Zachary Hall is a 88 y.o. male with medical history significant for CAD s/p CABG, dementia, left renal carcinoma s/p partial left nephrectomy 01/2021, RBBB with sinus bradycardia and no indication for pacemaker (per review of cardiology note from 11/02/2022), remote history of paroxysmal A-fib, who was brought to the ED after a fall in the bathtub.  Wife at the bedside who gives most of the history says she heard a thud and when she rushed to  the bathroom found him halfway in, halfway out of bathtub.  He was awake and alert and did not appear to have injuries.  Patient had fallen 3 times the day prior.  Wife is concerned that in the previous days he was constipated and she gave him a laxative and he had a few episodes of loose stool to where she had to place him in a diaper.  Otherwise he had been in his usual state of health.  ED course and data review: BP 216/79, with bradycardia at 254 and otherwise normal vitals.  Labs unremarkable except for baseline anemia of 11.2.  CK normal at 86.  CT head and C-spine nonacute.Clear  Chest x-ray and pelvic x-ray nonacute.  EKG, personally viewed and interpreted shows sinus bradycardia at 60 with RBBB and LAFB.  Patient was treated with a 500 mL sodium chloride bolus,  given hydralazine 5 mg IV for BP with improvement 158/87 by admission.  Hospitalist consulted for admission." Type of Study: Bedside Swallow Evaluation Previous Swallow Assessment: Barium Swallow 2022 "Mild tertiary contractions in the middle and distal thirds of the  esophagus. Otherwise unremarkable exam." Diet Prior to this Study: Regular;Thin liquids (Level 0) Temperature Spikes Noted: No Respiratory Status: Room air History of Recent Intubation: No Behavior/Cognition: Alert;Confused;Agitated;Distractible;Doesn't follow directions Oral Cavity Assessment: Within Functional Limits Oral Care Completed by SLP: Recent completion by staff Oral Cavity - Dentition: Adequate natural dentition Vision:  (appeared [facility]) Self-Feeding Abilities: Total assist Patient Positioning: Upright in bed Baseline Vocal Quality: Low vocal intensity Volitional Cough: Cognitively unable to elicit Volitional Swallow: Unable to elicit    Oral/Motor/Sensory Function Overall Oral Motor/Sensory Function:  (unable to follow commands for participation)   [place] chips: Not tested   Thin Liquid Thin Liquid: Within functional limits Presentation: Straw (pinched straw to limit rate/volume; tending to take multiple sequential sips otherwise)    Nectar Thick Nectar Thick Liquid: Not tested   Honey Thick Honey Thick Liquid: Not tested   Puree Puree: Within functional limits Presentation: Spoon   Solid     Solid: Impaired Presentation: Spoon Oral Phase Impairments:  (held anteriorly and expectorated despite cueing)     Clyde Canterbury, M.S., CCC-SLP Speech-Language Pathologist [facility] [phone] Arnette Felts)  Woodroe Chen 11/12/2022,1:13 PM

## 2022-11-12 NOTE — Progress Notes (Signed)
Pt's wife requesting melatonin to be scheduled at bedtime instead of PRN. Gillis Santa, MD notified. See new order.

## 2022-11-12 NOTE — Progress Notes (Signed)
[facility] Progress Note  Patient: MARGIE BRINK    IRS:[id number]  DOA: 11/09/2022     Date of Service: the patient was seen and examined on 11/12/2022  Chief Complaint  Patient presents with   Fall   Brief hospital course: TRENTYN BOISCLAIR is a 88 y.o. male with medical history significant for CAD s/p CABG, dementia, left renal carcinoma s/p partial left nephrectomy 01/2021, RBBB with sinus bradycardia and no indication for pacemaker (per review of cardiology note from 11/02/2022), remote history of paroxysmal A-fib, who was brought to the ED after a fall in the bathtub.  Wife at the bedside who gives most of the history says she heard a thud and when she rushed to the bathroom found him halfway in, halfway out of bathtub.  He was awake and alert and did not appear to have injuries.  Patient had fallen 3 times the day prior.  Wife is concerned that in the previous days he was constipated and she gave him a laxative and he had a few episodes of loose stool to where she had to place him in a diaper.  Otherwise he had been in his usual state of health. ED course and data review: BP 216/79, with bradycardia at 254 and otherwise normal vitals.  Labs unremarkable except for baseline anemia of 11.2.  CK normal at 86. CT head and C-spine nonacute.Clear  Chest x-ray and pelvic x-ray nonacute. EKG, personally viewed and interpreted shows sinus bradycardia at 60 with RBBB and LAFB.  Patient was treated with a 500 mL sodium chloride bolus, given hydralazine 5 mg IV for BP with improvement 158/87 by admission. Hospitalist consulted for admission.    Assessment and Plan: * Syncope and collapse  Sinus bradycardia with RBBB No evidence of injury on imaging studies Workup unrevealing Some concern for symptomatic bradycardia as etiology of falls Continuous cardiac monitoring, echocardiogram Received a 1 L NS bolus in the ED Neurologic checks fall and aspiration precautions Avoid AV nodal agents for  BP control   Hypertensive urgency SBP over 200 on arrival, improving after IV hydralazine 5 mg suspect in part related to pain Continue losartan 50 and amlodipine 10 mg POD, home dose 4/12 started Imdur 60 mg p.o. every evening Monitor BP and titrate medication accordingly    Dementia and acute delirium Not on any medication Delirium precautions Patient has never been diagnosed with Parkinson disease but patient wife did notice resting tremors.  On exam patient has bilateral upper and lower extremity rigidity it could be the part of dementia. Patient's wife agreed to try Sinemet. Started Sinemet low-dose 3 times daily, we will continue to monitor if there is any improvement started Seroquel 50 mg p.o. twice daily Started melatonin 5 mg nightly due to insomnia  History of atrial fibrillation Not currently on systemic anticoagulation, no rate control agents Continue telemetry   Coronary artery disease of bypass graft of native heart with stable angina pectoris Troponin was negative at 11 and EKG showed no ischemic changes Continue atorvastatin 5 mg, losartan and nitroglycerin sublingual as needed   Urinary retention, Foley catheter was inserted on 4/13, bladder scan showed >700 mL urine.  Started Flomax Patient is to follow with urology as an outpatient for voiding trial after 1 week  History of renal cell carcinoma s/p partial left nephrectomy 01/2021 No acute disused suspected  Body mass index is 22.73 kg/m.  Interventions:       Diet: Heart healthy diet DVT Prophylaxis: Subcutaneous Lovenox  Advance goals of care discussion: DNR  Family Communication: family was present at bedside, at the time of interview.  The pt provided permission to discuss medical plan with the family. Opportunity was given to ask question and all questions were answered satisfactorily.  D/w patient's wife over the phone  Disposition:  Pt is from Home, admitted with Fall and syncope, still at  high risk for fall, which precludes a safe discharge.  Urinary retention, Foley catheter was inserted on 4/13.  Patient's condition is gradually improving, started Sinemet new medication for possible Parkinson disease.   Discharge to SNF, TOC is following for placement.    Subjective: No significant events overnight other than decreased sleep and disorientation.  No behavioral issue noticed.  Patient's wife still thinks that he is not back to his baseline and may not improve due to worsening of dementia.  Overall patient feels better, unable to offer any complaints, resting comfortably. We will continue current treatment and follow along.  Physical Exam: General: NAD, lying comfortably Appear in no distress, affect appropriate Eyes: PERRLA ENT: Oral Mucosa Clear, moist  Neck: no JVD,  Cardiovascular: S1 and S2 Present, no Murmur,  Respiratory: good respiratory effort, Bilateral Air entry equal and Decreased, no Crackles, no wheezes Abdomen: Bowel Sound present, Soft and no tenderness,  Skin: no rashes Extremities: no Pedal edema, no calf tenderness Neurologic: without any new focal findings, rigidity of bilateral upper and lower extremities seems improved today Gait not checked due to patient safety concerns  Vitals:   11/11/22 1532 11/11/22 2021 11/12/22 0608 11/12/22 0820  BP: (!) 102/57 127/67 (!) 146/67 138/70  Pulse: 70 76 72 71  Resp: Temp: 98.5 F (36.9 C) 98.2 F (36.8 C) 98.4 F (36.9 C) 98.2 F (36.8 C)  TempSrc: Oral     SpO2: 95% 96% 96% 98%  Weight:      Height:        Intake/Output Summary (Last 24 hours) at 11/12/2022 1239 Last data filed at 11/12/2022 1000 Gross per 24 hour  Intake 363 ml  Output 200 ml  Net 163 ml   Filed Weights   11/09/22 0212 11/10/22 0500 11/11/22 0547  Weight: 72.6 kg 71.8 kg 55.4 kg    Data Reviewed: I have personally reviewed and interpreted daily labs, tele strips, imagings as discussed above. I reviewed all  nursing notes, pharmacy notes, vitals, pertinent old records I have discussed plan of care as described above with RN and patient/family.  CBC: Recent Labs  Lab 11/09/22 0103 11/10/22 0336 11/11/22 0416  WBC 10.1 7.9 9.4  NEUTROABS 7.4  --   --   HGB 11.2* 12.0* 10.8*  HCT 33.9* 35.1* 31.9*  MCV 101.5* 99.7 100.6*  PLT 155 170 173   Basic Metabolic Panel: Recent Labs  Lab 11/09/22 0103 11/10/22 0336 11/11/22 0416  NA 139 140 137  K 3.6 3.5 3.4*  CL 108 106 106  CO2 GLUCOSE 107* 102* 114*  BUN 32* 23 25*  CREATININE 1.10 0.99 1.16  CALCIUM 8.8* 9.0 8.8*  MG 2.0 2.0 2.0  PHOS  --  3.3 3.6    Studies: No results found.  Scheduled Meds:  amLODipine  10 mg Oral Daily   atorvastatin  5 mg Oral q1800   carbidopa-levodopa  1 tablet Oral TID   Chlorhexidine Gluconate Cloth  6 each Topical Q0600   diclofenac Sodium  2 g Topical QID   enoxaparin (LOVENOX) injection  40 mg Subcutaneous Q24H   isosorbide mononitrate  60 mg Oral QPM   losartan  50 mg Oral Daily   melatonin  5 mg Oral QHS   pantoprazole  40 mg Oral Daily   QUEtiapine  50 mg Oral BID   sodium chloride flush  3 mL Intravenous Q12H   tamsulosin  0.4 mg Oral QPC supper   Continuous Infusions: PRN Meds: acetaminophen **OR** acetaminophen, hydrALAZINE **OR** hydrALAZINE, HYDROcodone-acetaminophen, nitroGLYCERIN, ondansetron **OR** ondansetron (ZOFRAN) IV  Time spent: 35 minutes  Author: Gillis Santa. MD [facility] 11/12/2022 12:39 PM  To reach On-call, see care teams to locate the attending and reach out to them via [email or web address]. If 7PM-7AM, please contact night-coverage If you still have difficulty reaching the attending provider, please page the [facility] (Director on Call) for [facility] on amion for assistance.

## 2022-11-12 NOTE — Progress Notes (Signed)
Pt continues to cough repeatedly after wife gives pt cup of water with straw. RN educated pt's wife to avoid straws as pt is at high risk for aspiration. Pt's wife verbalizes understanding, yet continues to use straws. Pt tried to chew AM meds so meds given this morning crushed in applesauce. Gillis Santa, MD notified as pt is on regular diet with thin liquids. See new order for SLP consult.

## 2022-11-12 NOTE — Plan of Care (Signed)

## 2022-11-13 DIAGNOSIS — R55 Syncope and collapse: Secondary | ICD-10-CM | POA: Diagnosis not present

## 2022-11-13 DIAGNOSIS — E43 Unspecified severe protein-calorie malnutrition: Secondary | ICD-10-CM | POA: Insufficient documentation

## 2022-11-13 LAB — CBC
HCT: 31.1 % — ABNORMAL LOW (ref 39.0–52.0)
Hemoglobin: 10.5 g/dL — ABNORMAL LOW (ref 13.0–17.0)
MCH: 33.9 pg (ref 26.0–34.0)
MCHC: 33.8 g/dL (ref 30.0–36.0)
MCV: 100.3 fL — ABNORMAL HIGH (ref 80.0–100.0)
Platelets: 163 10*3/uL (ref 150–400)
RBC: 3.1 MIL/uL — ABNORMAL LOW (ref 4.22–5.81)
RDW: 12.9 % (ref 11.5–15.5)
WBC: 7.8 10*3/uL (ref 4.0–10.5)
nRBC: 0 % (ref 0.0–0.2)

## 2022-11-13 LAB — BASIC METABOLIC PANEL
Anion gap: 8 (ref 5–15)
BUN: 31 mg/dL — ABNORMAL HIGH (ref 8–23)
CO2: 26 mmol/L (ref 22–32)
Calcium: 9 mg/dL (ref 8.9–10.3)
Chloride: 107 mmol/L (ref 98–111)
Creatinine, Ser: 0.96 mg/dL (ref 0.61–1.24)
GFR, Estimated: >60 mL/min (ref >60)
Glucose, Bld: 118 mg/dL — ABNORMAL HIGH (ref 70–99)
Potassium: 3.8 mmol/L (ref 3.5–5.1)
Sodium: 141 mmol/L (ref 135–145)

## 2022-11-13 LAB — PHOSPHORUS: Phosphorus: 3.6 mg/dL (ref 2.5–4.6)

## 2022-11-13 LAB — MAGNESIUM: Magnesium: 2.2 mg/dL (ref 1.7–2.4)

## 2022-11-13 MED ORDER — POLYMYXIN B-TRIMETHOPRIM 10000-0.1 UNIT/ML-% OP SOLN
1.0000 [drp] | Freq: Four times a day (QID) | OPHTHALMIC | Status: DC
  Administered 2022-11-13 – 2022-11-14 (×6): 1 [drp] via OPHTHALMIC
  Filled 2022-11-13: qty 10

## 2022-11-13 MED ORDER — ENSURE ENLIVE PO LIQD
237.0000 mL | Freq: Three times a day (TID) | ORAL | Status: DC
  Administered 2022-11-13 – 2022-11-14 (×3): 237 mL via ORAL

## 2022-11-13 MED ORDER — ADULT MULTIVITAMIN LIQUID CH
15.0000 mL | Freq: Every day | ORAL | Status: DC
  Administered 2022-11-14: 15 mL via ORAL
  Filled 2022-11-13: qty 15

## 2022-11-13 MED ORDER — MELATONIN 5 MG PO TABS
5.0000 mg | ORAL_TABLET | Freq: Every evening | ORAL | Status: DC
  Administered 2022-11-13: 5 mg via ORAL
  Filled 2022-11-13: qty 1

## 2022-11-13 NOTE — Progress Notes (Signed)
       CROSS COVER NOTE  NAME: Zachary Hall MRN: [id number] DOB : Apr 10, 1935 ATTENDING PHYSICIAN: Zachary Santa, MD    Date of Service   11/13/2022   HPI/Events of Note   Report/Request  Eye drainage that began 11/11/22.  Bedside eval At bedside Zachary Hall is noted to have yellow discharge from both eyes. (R) > (L). Wife reports he has not endorsed itching and she has not seen him touching eyes. There is mild swelling and redness to the (R) eye.   Interventions   Assessment/Plan:  Bacterial Conjunctivitis  Polytrim drops QID for 5 days      To reach the provider On-Call:   7AM- 7PM see care teams to locate the attending and reach out to them via [email or web address] Password: [email or web address] 7PM-7AM contact night-coverage If you still have difficulty reaching the appropriate provider, please page the [facility] (Director on Call) for [facility] on amion for assistance  This document was prepared using Conservation officer, historic buildings and may include unintentional dictation errors.  Bishop Limbo DNP, MBA, FNP-BC, PMHNP-BC Nurse Practitioner [facility] Pager [phone]

## 2022-11-13 NOTE — Progress Notes (Signed)
[facility] Progress Note  Patient: Zachary Hall    ZOX:[id number]  DOA: 11/09/2022     Date of Service: the patient was seen and examined on 11/13/2022  Chief Complaint  Patient presents with   Fall   Brief hospital course: CURLEE BOGAN is a 88 y.o. male with medical history significant for CAD s/p CABG, dementia, left renal carcinoma s/p partial left nephrectomy 01/2021, RBBB with sinus bradycardia and no indication for pacemaker (per review of cardiology note from 11/02/2022), remote history of paroxysmal A-fib, who was brought to the ED after a fall in the bathtub.  Wife at the bedside who gives most of the history says she heard a thud and when she rushed to the bathroom found him halfway in, halfway out of bathtub.  He was awake and alert and did not appear to have injuries.  Patient had fallen 3 times the day prior.  Wife is concerned that in the previous days he was constipated and she gave him a laxative and he had a few episodes of loose stool to where she had to place him in a diaper.  Otherwise he had been in his usual state of health. ED course and data review: BP 216/79, with bradycardia at 254 and otherwise normal vitals.  Labs unremarkable except for baseline anemia of 11.2.  CK normal at 86. CT head and C-spine nonacute.Clear  Chest x-ray and pelvic x-ray nonacute. EKG, personally viewed and interpreted shows sinus bradycardia at 60 with RBBB and LAFB.  Patient was treated with a 500 mL sodium chloride bolus, given hydralazine 5 mg IV for BP with improvement 158/87 by admission. Hospitalist consulted for admission.    Assessment and Plan: * Syncope and collapse  Sinus bradycardia with RBBB No evidence of injury on imaging studies Workup unrevealing Some concern for symptomatic bradycardia as etiology of falls Continuous cardiac monitoring, echocardiogram Received a 1 L NS bolus in the ED Neurologic checks fall and aspiration precautions Avoid AV nodal agents for  BP control   Hypertensive urgency SBP over 200 on arrival, improving after IV hydralazine 5 mg suspect in part related to pain Continue losartan 50 and amlodipine 10 mg POD, home dose 4/12 started Imdur 60 mg p.o. every evening Monitor BP and titrate medication accordingly    Dementia and acute delirium Not on any medication Delirium precautions Patient has never been diagnosed with Parkinson disease but patient wife did notice resting tremors.  On exam patient has bilateral upper and lower extremity rigidity it could be the part of dementia. Patient's wife agreed to try Sinemet. Started Sinemet low-dose 3 times daily, we will continue to monitor if there is any improvement started Seroquel 50 mg p.o. twice daily Started melatonin 5 mg nightly due to insomnia  History of atrial fibrillation Not currently on systemic anticoagulation, no rate control agents Continue telemetry   Coronary artery disease of bypass graft of native heart with stable angina pectoris Troponin was negative at 11 and EKG showed no ischemic changes Continue atorvastatin 5 mg, losartan and nitroglycerin sublingual as needed   Urinary retention, Foley catheter was inserted on 4/13, bladder scan showed >700 mL urine.  Started Flomax Patient is to follow with urology as an outpatient for voiding trial after 1 week  History of renal cell carcinoma s/p partial left nephrectomy 01/2021 No acute disused suspected  Body mass index is 22.73 kg/m.  Interventions:       Diet: Heart healthy diet DVT Prophylaxis: Subcutaneous Lovenox  Advance goals of care discussion: DNR  Family Communication: family was present at bedside, at the time of interview.  The pt provided permission to discuss medical plan with the family. Opportunity was given to ask question and all questions were answered satisfactorily.  4/15 no one available during my rounds  Disposition:  Pt is from Home, admitted with Fall and syncope, still  at high risk for fall, which precludes a safe discharge.  Urinary retention, Foley catheter was inserted on 4/13.  Patient's condition is gradually improving, started Sinemet new medication for possible Parkinson disease.   Discharge to SNF, TOC is following for placement.    Subjective: No significant events overnight.  Patient was very sleepy in the morning, seems to be resting after, unable to offer any complaints.  Physical Exam: General: NAD, lying comfortably Appear in no distress, affect appropriate Eyes: closed, sleepy ENT: Oral Mucosa Clear, moist  Neck: no JVD,  Cardiovascular: S1 and S2 Present, no Murmur,  Respiratory: good respiratory effort, Bilateral Air entry equal and Decreased, no Crackles, no wheezes Abdomen: Bowel Sound present, Soft and no tenderness,  Skin: no rashes Extremities: no Pedal edema, no calf tenderness Neurologic: without any new focal findings, rigidity of bilateral upper and lower extremities seems improved today Gait not checked due to patient safety concerns  Vitals:   11/12/22 1622 11/12/22 1938 11/13/22 0345 11/13/22 0726  BP: (!) 158/68 (!) 141/81 (!) 122/58 (!) 156/67  Pulse: 72 69 65 65  Resp: Temp: 97.9 F (36.6 C) 98.1 F (36.7 C) 98 F (36.7 C) 99 F (37.2 C)  TempSrc:  Oral Oral   SpO2: 96% 95% 98% 97%  Weight:      Height:        Intake/Output Summary (Last 24 hours) at 11/13/2022 1305 Last data filed at 11/13/2022 1055 Gross per 24 hour  Intake 360 ml  Output 700 ml  Net -340 ml   Filed Weights   11/09/22 0212 11/10/22 0500 11/11/22 0547  Weight: 72.6 kg 71.8 kg 55.4 kg    Data Reviewed: I have personally reviewed and interpreted daily labs, tele strips, imagings as discussed above. I reviewed all nursing notes, pharmacy notes, vitals, pertinent old records I have discussed plan of care as described above with RN and patient/family.  CBC: Recent Labs  Lab 11/09/22 0103 11/10/22 0336 11/11/22 0416  11/13/22 0600  WBC 10.1 7.9 9.4 7.8  NEUTROABS 7.4  --   --   --   HGB 11.2* 12.0* 10.8* 10.5*  HCT 33.9* 35.1* 31.9* 31.1*  MCV 101.5* 99.7 100.6* 100.3*  PLT 155 170 173 163   Basic Metabolic Panel: Recent Labs  Lab 11/09/22 0103 11/10/22 0336 11/11/22 0416 11/13/22 0600  NA 139 140 137 141  K 3.6 3.5 3.4* 3.8  CL 108 106 106 107  CO2 GLUCOSE 107* 102* 114* 118*  BUN 32* 23 25* 31*  CREATININE 1.10 0.99 1.16 0.96  CALCIUM 8.8* 9.0 8.8* 9.0  MG 2.0 2.0 2.0 2.2  PHOS  --  3.3 3.6 3.6    Studies: No results found.  Scheduled Meds:  amLODipine  10 mg Oral Daily   atorvastatin  5 mg Oral q1800   carbidopa-levodopa  1 tablet Oral TID   Chlorhexidine Gluconate Cloth  6 each Topical Q0600   diclofenac Sodium  2 g Topical QID   enoxaparin (LOVENOX) injection  40 mg Subcutaneous Q24H   isosorbide mononitrate  60 mg Oral QPM   losartan  50 mg Oral Daily   melatonin  5 mg Oral QHS   pantoprazole  40 mg Oral Daily   QUEtiapine  50 mg Oral BID   sodium chloride flush  3 mL Intravenous Q12H   tamsulosin  0.4 mg Oral QPC supper   trimethoprim-polymyxin b  1 drop Both Eyes QID   Continuous Infusions: PRN Meds: acetaminophen **OR** acetaminophen, hydrALAZINE **OR** hydrALAZINE, HYDROcodone-acetaminophen, nitroGLYCERIN, ondansetron **OR** ondansetron (ZOFRAN) IV  Time spent: 35 minutes  Author: Gillis Santa. MD [facility] 11/13/2022 1:05 PM  To reach On-call, see care teams to locate the attending and reach out to them via [email or web address]. If 7PM-7AM, please contact night-coverage If you still have difficulty reaching the attending provider, please page the [facility] (Director on Call) for [facility] on amion for assistance.

## 2022-11-13 NOTE — Progress Notes (Signed)
Physical Therapy Treatment Patient Details Name: Zachary Hall MRN: [id number] DOB: 01-01-1935 Today's Date: 11/13/2022   History of Present Illness 88 y.o. male with medical history significant for CAD s/p CABG, dementia, left renal carcinoma s/p partial left nephrectomy 01/2021, RBBB with sinus bradycardia and no indication for pacemaker (per review of cardiology note from 11/02/2022), remote history of paroxysmal A-fib, who was brought to the ED after a fall in the bathtub.  Wife at the bedside who gives most of the history says she heard a thud and when she rushed to the bathroom found him halfway in, halfway out of bathtub.  He was awake and alert and did not appear to have injuries.  Patient had fallen 3 times the day prior.  Wife is concerned that in the previous days he was constipated and she gave him a laxative and he had a few episodes of loose stool to where she had to place him in a diaper.  Otherwise he had been in his usual state of health.    PT Comments    Pt asleep upon arrival with untouched meal tray at bedside. Wife in room and in support of attempting to wake patient for session. Heavy tactile cues required for arousal. Pt unable to initiate OOB mobility and required max assist for sitting at EOB and to maintain position. Seated balance activities performed and assisted wife in feeding lunch tray once back in bed. Education given on being awake during day time and wife has concerns that sleep aids are given too late in the night making him lethargic the following day. Encouraged her to reach out to MD. Will continue to progress as able.   Recommendations for follow up therapy are one component of a multi-disciplinary discharge planning process, led by the attending physician.  Recommendations may be updated based on patient status, additional functional criteria and insurance authorization.  Follow Up Recommendations  Can patient physically be transported by private vehicle: No     Assistance Recommended at Discharge Frequent or constant Supervision/Assistance  Patient can return home with the following Two people to help with walking and/or transfers;Two people to help with bathing/dressing/bathroom;Help with stairs or ramp for entrance   Equipment Recommendations   (TBD)    Recommendations for Other Services       Precautions / Restrictions Precautions Precautions: Fall Restrictions Weight Bearing Restrictions: No     Mobility  Bed Mobility Overal bed mobility: Needs Assistance Bed Mobility: Supine to Sit, Sit to Supine     Supine to sit: Max assist Sit to supine: Max assist, +2 for physical assistance   General bed mobility comments: needs assist for initiaion of mobility. Pt suprised and seems shocked by movement despite cues for pt assist. Once seated at EOB, poor balance noted with post leaning. Poor tolerance for sitting at EOB and +2 assist for returning back to bed and repositioning    Transfers                   General transfer comment: deferred due to poor sitting balance    Ambulation/Gait                   Stairs             Wheelchair Mobility    Modified Rankin (Stroke Patients Only)       Balance Overall balance assessment: Needs assistance Sitting-balance support: Feet supported Sitting balance-Leahy Scale: Zero  Cognition Arousal/Alertness: Lethargic Behavior During Therapy: WFL for tasks assessed/performed Overall Cognitive Status: History of cognitive impairments - at baseline                                 General Comments: confused at baseline. Sundowns around 4pm.        Exercises Other Exercises Other Exercises: seated balance weightshifting activities performed including ant/post weight shifts in addition to lateral weight shifts with max assist. Other Exercises: Seated attempts for LAQ and forward reaches however  unable to consistently respond to commands. Spontaneous movement noted Other Exercises: once returned back to bed, assisted wife in feeding lunch.    General Comments        Pertinent Vitals/Pain Pain Assessment Pain Assessment: No/denies pain    Home Living                          Prior Function            PT Goals (current goals can now be found in the care plan section) Acute Rehab PT Goals Patient Stated Goal: to eat PT Goal Formulation: Patient unable to participate in goal setting Progress towards PT goals: Progressing toward goals    Frequency    Min 2X/week      PT Plan Current plan remains appropriate    Co-evaluation              AM-PAC PT "6 Clicks" Mobility   Outcome Measure  Help needed turning from your back to your side while in a flat bed without using bedrails?: A Lot Help needed moving from lying on your back to sitting on the side of a flat bed without using bedrails?: Total Help needed moving to and from a bed to a chair (including a wheelchair)?: Total Help needed standing up from a chair using your arms (e.g., wheelchair or bedside chair)?: Total Help needed to walk in hospital room?: Total Help needed climbing 3-5 steps with a railing? : Total 6 Click Score: 7    End of Session   Activity Tolerance: Patient tolerated treatment well Patient left: in bed (in chair position) Nurse Communication: Mobility status PT Visit Diagnosis: Unsteadiness on feet (R26.81);Muscle weakness (generalized) (M62.81);History of falling (Z91.81);Difficulty in walking, not elsewhere classified (R26.2)     Time: 1583-0940 PT Time Calculation (min) (ACUTE ONLY): 17 min  Charges:  $Therapeutic Activity: 8-22 mins                     Elizabeth Palau, PT, DPT, GCS [phone]    Karthik Whittinghill 11/13/2022, 1:41 PM

## 2022-11-13 NOTE — TOC Progression Note (Addendum)
Transition of Care [facility]) - Progression Note    Patient Details  Name: Zachary Hall MRN: [id number] Date of Birth: 22-Jun-1935  [facility] CM/SW Contact  Darleene Cleaver, [place] Phone Number: 11/13/2022, 11:00 AM  Clinical Narrative:     [facility] number has been received, [phone] [id number].  Patient has been faxed out awaiting for bed offers.  12:40pm  TOC spoke to patient's wife to provide choice of SNFs, she chose [facility].  TOC contacted Sue Lush at [facility] and she can accept patient pending insurance authorization and being medically ready for discharge.  TOC updated MD and PT.  TOC also updated FL2 with [facility] number attached.  TOC to continue to follow patient's progress throughout discharge planning.  4:30pm  Patient was approved for SNF placement by insurance company authorization number [id number], start date of 4/16, next review 4/18.  TOC spoke to Sue Lush at [facility], and she can accept patient once he is medically ready for discharge.  TOC updated patient's wife Zachary Hall and attending physician that patient has been approved for SNF placement at [facility].  TOC to continue to follow patient's progress throughout discharge planning.  Expected Discharge Plan: Home w Home Health Services Barriers to Discharge: Continued Medical Work up  Expected Discharge Plan and Services     Post Acute Care Choice: Home Health, Durable Medical Equipment Living arrangements for the past 2 months: Single Family Home                 DME Arranged: Walker rolling DME Agency: [employer]       HH Arranged: PT, OT, Respirator Therapy, Social Work HH Agency: [facility] Date HH Agency Contacted: 11/11/22 Time HH Agency Contacted: 0940 Representative spoke with at [facility] Agency: Barbara Cower   Social Determinants of Health (SDOH) Interventions SDOH Screenings   Food Insecurity: No Food Insecurity (11/09/2022)  Housing: Low Risk  (11/09/2022)  Transportation Needs: No  Transportation Needs (11/09/2022)  Utilities: Not At Risk (11/09/2022)  Depression (PHQ2-9): Low Risk  (08/31/2022)  Tobacco Use: Medium Risk (11/09/2022)    Readmission Risk Interventions     No data to display

## 2022-11-13 NOTE — Progress Notes (Signed)
Speech Language Pathology Treatment: Dysphagia  Patient Details Name: Zachary Hall MRN: [id number] DOB: [birth year] Today's Date: 11/13/2022 Time: 7829-5621 SLP Time Calculation (min) (ACUTE ONLY): 13 min  Assessment / Plan / Recommendation Clinical Impression  Pt seen today for dysphagia tx. Wife present. Pt sitting up in bed appearing lethargic and snoring t/o session -- alertness waxing/waning. Wife reported pt has not eaten breakfast this morning and only awakened long enough to take meds from NSG. Wife reported pt taking Melatonin last night, which she suspects impacted his current lethargic presentation. Pt left laying in bed w/ bed alarm set and wife present.  Trials limited d/t pt's intermittent alertness and generally lethargic state. During pt's moments of alertness (pt opening eyes, speaking w/ wife), pt's wife fed him x3 trials of puree and x1 trial of thin liquids via pinched straw. Pt required mod/max verbal/tactile/visual cues from wife. No overt, apparent s/s of oropharyngeal dysphagia noted w/ limited trials. Noted delayed cough several minutes after trials of puree - did not appear correlated w/ po's.   Wife expressed interest in downgrading pt to purees for ease of intake and pt's personal preferences. Pt/Wife educated on compensatory strategies, aspiration precautions, change of diet, and SLP POC. Unsure of pt's full understanding of information.  Recommend Dys 1 diet (purees) w/ thin liquids via straw (trained caregiver to pinch straw). Total assist for feeding. Full supervision during feeding. Follow strict aspiration/reflux precautions at all po's. ST will continue to monitor for pt's diet tolerance and potential upgrade pending pt's improving presentation. Wife/NSG updated/agreed.     HPI HPI: Per H&P "Zachary Hall is a 88 y.o. male with medical history significant for CAD s/p CABG, dementia, left renal carcinoma s/p partial left nephrectomy 01/2021, RBBB with sinus  bradycardia and no indication for pacemaker (per review of cardiology note from 11/02/2022), remote history of paroxysmal A-fib, who was brought to the ED after a fall in the bathtub.  Wife at the bedside who gives most of the history says she heard a thud and when she rushed to the bathroom found him halfway in, halfway out of bathtub.  He was awake and alert and did not appear to have injuries.  Patient had fallen 3 times the day prior.  Wife is concerned that in the previous days he was constipated and she gave him a laxative and he had a few episodes of loose stool to where she had to place him in a diaper.  Otherwise he had been in his usual state of health.  ED course and data review: BP 216/79, with bradycardia at 254 and otherwise normal vitals.  Labs unremarkable except for baseline anemia of 11.2.  CK normal at 86.  CT head and C-spine nonacute.Clear  Chest x-ray and pelvic x-ray nonacute.  EKG, personally viewed and interpreted shows sinus bradycardia at 60 with RBBB and LAFB.  Patient was treated with a 500 mL sodium chloride bolus, given hydralazine 5 mg IV for BP with improvement 158/87 by admission.  Hospitalist consulted for admission."      SLP Plan  Continue with current plan of care      Recommendations for follow up therapy are one component of a multi-disciplinary discharge planning process, led by the attending physician.  Recommendations may be updated based on patient status, additional functional criteria and insurance authorization.    Recommendations  Diet recommendations: Dysphagia 1 (puree);Thin liquid Liquids provided via: Straw (Staff/caregiver to pinch straw) Medication Administration: Crushed with puree Supervision: Full supervision/cueing  for compensatory strategies;Trained caregiver to feed patient Compensations: Minimize environmental distractions;Slow rate;Small sips/bites Postural Changes and/or Swallow Maneuvers: Seated upright 90 degrees;Upright 30-60 min after  meal                  Oral care QID;Staff/trained caregiver to provide oral care   Frequent or constant Supervision/Assistance Dysphagia, oropharyngeal phase (R13.12)     Continue with current plan of care   Dennie Fetters Graduate Clinician [facility], Speech Pathology   Dennie Fetters  11/13/2022, 12:58 PM

## 2022-11-13 NOTE — [place] FL2 (Signed)
[place] MEDICAID FL2 LEVEL OF CARE FORM     IDENTIFICATION  Patient Name: Zachary Hall Birthdate: Mar 26, 1935 Sex: male Admission Date (Current Location): 11/09/2022  [facility] and [place] Number:  Chiropodist and Address:  [facility], [address] [place], [place] [address]      Provider Number: [id number]  Attending Physician Name and Address:  Gillis Santa, MD  Relative Name and Phone Number:  Rockney Ghee (Spouse) [phone] (Home Phone)    Current Level of Care: Hospital Recommended Level of Care: Skilled Nursing Facility Prior Approval Number:    Date Approved/Denied:   PASRR Number: [id number] (Pending additional information.)  Discharge Plan: SNF    Current Diagnoses: Patient Active Problem List   Diagnosis Date Noted   Syncope and collapse 11/09/2022   Hypertensive urgency 11/09/2022   DDD (degenerative disc disease), cervical 10/20/2022   History of renal cell carcinoma s/p partial left nephrectomy 01/2021 08/31/2022   Knee joint stiffness, bilateral 08/31/2022   CKD (chronic kidney disease) stage 3, GFR 30-59 ml/min 06/01/2022   Constipation 06/01/2022   Anemia 05/11/2022   Thrombocytopenia 04/13/2022   Fall 04/13/2022   Unintentional weight loss 02/23/2022   Fatigue 02/23/2022   Balance problem 01/27/2022   Hyperglycemia 01/27/2022   Dementia 01/27/2022   Dementia due to Alzheimer's disease 09/07/2021   Macular pucker, left eye 08/23/2021   History of partial nephrectomy 08/21/2021   Type A blood, Rh positive 08/21/2021   Hyponatremia 08/21/2021   IFG (impaired fasting glucose) 08/21/2021   Low hemoglobin 08/21/2021   Renal mass 02/25/2021   Right epiretinal membrane 02/10/2020   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   Pseudophakia of both eyes 02/10/2020   Posterior vitreous detachment of right eye 02/10/2020   Right inguinal hernia 01/21/2017   Mild cognitive impairment 09/29/2016   Essential  hypertension 09/16/2015   Hyperlipidemia 09/16/2015   Stasis dermatitis 05/14/2014   Arteriosclerosis of coronary artery 05/04/2014   RBBB (right bundle branch block) 09/02/2013   H/O coronary artery bypass surgery 12/15/2010   Presence of aortocoronary bypass graft 12/15/2010   Sinus bradycardia with RBBB 11/21/2010   Coronary artery disease of bypass graft of native heart with stable angina pectoris    History of atrial fibrillation     Orientation RESPIRATION BLADDER Height & Weight     Self, Place  Normal Incontinent Weight: 122 lb 2.2 oz (55.4 kg) Height:   (177.8 cm)  BEHAVIORAL SYMPTOMS/MOOD NEUROLOGICAL BOWEL NUTRITION STATUS      Incontinent Diet (Dysphagia 3 diet)  AMBULATORY STATUS COMMUNICATION OF NEEDS Skin   Limited Assist Verbally (But garbled at times.) Normal                       Personal Care Assistance Level of Assistance  Bathing, Feeding, Dressing Bathing Assistance: Limited assistance Feeding assistance: Limited Assistance Dressing Assistance: Limited assistance     Functional Limitations Info  Sight, Hearing, Speech Sight Info: Adequate Hearing Info: Adequate Speech Info: Adequate    SPECIAL CARE FACTORS FREQUENCY  PT (By licensed PT), OT (By licensed OT), Speech therapy     PT Frequency: Minimum 5x a week OT Frequency: Minimum 5x a week     Speech Therapy Frequency: Minimum 2x a week      Contractures Contractures Info: Not present    Additional Factors Info  Code Status, Allergies, Psychotropic Code Status Info: DNR Allergies Info: Lexapro (Escitalopram)  Namenda (Memantine)  Lisinopril Psychotropic Info:  QUEtiapine (SEROQUEL) tablet 50 mg         Current Medications (11/13/2022):  This is the current hospital active medication list Current Facility-Administered Medications  Medication Dose Route Frequency Provider Last Rate Last Admin   acetaminophen (TYLENOL) tablet 650 mg  650 mg Oral Q6H PRN Andris Baumann, MD        Or   acetaminophen (TYLENOL) suppository 650 mg  650 mg Rectal Q6H PRN Andris Baumann, MD       amLODipine (NORVASC) tablet 10 mg  10 mg Oral Daily Lindajo Royal V, MD   10 mg at 11/13/22 0847   atorvastatin (LIPITOR) tablet 5 mg  5 mg Oral q1800 Andris Baumann, MD   5 mg at 11/12/22 1733   carbidopa-levodopa (SINEMET IR) 10-100 MG per tablet immediate release 1 tablet  1 tablet Oral TID Gillis Santa, MD   1 tablet at 11/13/22 0848   Chlorhexidine Gluconate Cloth 2 % PADS 6 each  6 each Topical Q0600 Gillis Santa, MD   6 each at 11/13/22 7425   diclofenac Sodium (VOLTAREN) 1 % topical gel 2 g  2 g Topical QID Gillis Santa, MD   2 g at 11/13/22 0850   enoxaparin (LOVENOX) injection 40 mg  40 mg Subcutaneous Q24H Lindajo Royal V, MD   40 mg at 11/13/22 0848   hydrALAZINE (APRESOLINE) injection 10 mg  10 mg Intravenous Q6H PRN Gillis Santa, MD       Or   hydrALAZINE (APRESOLINE) tablet 50 mg  50 mg Oral Q6H PRN Gillis Santa, MD       HYDROcodone-acetaminophen (NORCO/VICODIN) 5-325 MG per tablet 1-2 tablet  1-2 tablet Oral Q4H PRN Andris Baumann, MD       isosorbide mononitrate (IMDUR) 24 hr tablet 60 mg  60 mg Oral QPM Gillis Santa, MD   60 mg at 11/12/22 1733   losartan (COZAAR) tablet 50 mg  50 mg Oral Daily Lindajo Royal V, MD   50 mg at 11/13/22 0847   melatonin tablet 5 mg  5 mg Oral QHS Gillis Santa, MD   5 mg at 11/12/22 2207   nitroGLYCERIN (NITROSTAT) SL tablet 0.4 mg  0.4 mg Sublingual Q5 min PRN Andris Baumann, MD       ondansetron [facility]) tablet 4 mg  4 mg Oral Q6H PRN Andris Baumann, MD       Or   ondansetron [facility]) injection 4 mg  4 mg Intravenous Q6H PRN Andris Baumann, MD       pantoprazole (PROTONIX) EC tablet 40 mg  40 mg Oral Daily Lindajo Royal V, MD   40 mg at 11/13/22 0847   QUEtiapine (SEROQUEL) tablet 50 mg  50 mg Oral BID Gillis Santa, MD   50 mg at 11/13/22 0847   sodium chloride flush (NS) 0.9 % injection 3 mL  3 mL Intravenous Q12H Andris Baumann, MD    3 mL at 11/13/22 0850   tamsulosin (FLOMAX) capsule 0.4 mg  0.4 mg Oral QPC supper Gillis Santa, MD   0.4 mg at 11/12/22 1733   trimethoprim-polymyxin b (POLYTRIM) ophthalmic solution 1 drop  1 drop Both Eyes QID Foust, Katy L, NP   1 drop at 11/13/22 9563     Discharge Medications: Please see discharge summary for a list of discharge medications.  Relevant Imaging Results:  Relevant Lab Results:   Additional Information SS# [id number]  Darleene Cleaver, LCSW

## 2022-11-13 NOTE — Progress Notes (Signed)
Initial Nutrition Assessment  DOCUMENTATION CODES:   Severe malnutrition in context of chronic illness  INTERVENTION:   Ensure Enlive po TID, each supplement provides 350 kcal and 20 grams of protein.  Magic cup TID with meals, each supplement provides 290 kcal and 9 grams of protein  Liquid MVI po daily   Pt at high refeed risk; recommend monitor potassium, magnesium and phosphorus labs daily until stable  Daily weights   NUTRITION DIAGNOSIS:   Severe Malnutrition related to chronic illness (renal cancer, dementia) as evidenced by moderate fat depletion, moderate muscle depletion, severe muscle depletion, 8 percent weight loss in 2 months.  GOAL:   Patient will meet greater than or equal to 90% of their needs  MONITOR:   PO intake, Supplement acceptance, Labs, Weight trends, I & O's, Skin  REASON FOR ASSESSMENT:   Consult Assessment of nutrition requirement/status  ASSESSMENT:   88 y/o male with h/o CAD s/p CABG, Afib, RBBB s/p pacemaker, dementia, renal cancer s/p partial nephrectomy 2022, HLD, CKD III, gout and GERD who is admitted with syncope and urinary retention.  Met with pt and wife in room today. Pt is able to answer basic questions but the majority of history provided by wife. Wife reports pt with decreased appetite and oral intake pta. Wife reports that patient started to decline after having his nephrectomy but reports he has been getting worse over the past several months. Pt has been able to eat solid foods at home, but wife reports that patient has started choking some. Wife reports that patient is a "meat and potatoes" type of guy. Pt does drink two chocolate Ensure daily at home and takes a daily MVI. Wife reports weight loss pta; pt never regained back to his UBW of 180lbs after his nephrectomy. Per chart, pt is down ~14lbs(8%) over the past two months; this is significant. Pt eating only sips and bites in hospital but wife reports that he did not like the  pureed food. RD discussed with wife the progression of dementia regarding his nutritional status and oral intake. Feeding tube is not recommended with advanced dementia. Wife reports that she mainly wants to focus on getting pt home and being comfortable. RD will add supplements and MVI to help pt meet his estimated needs. Pt is at high refeed risk.   Medications reviewed and include: lovenox, melatonin, protonix  Labs reviewed: K 3.8 wnl, BUN 31(H), P 3.6 wnl, Mg 2.2 wnl Hgb 10.5(L), Hct 31.1(L)  NUTRITION - FOCUSED PHYSICAL EXAM:  Flowsheet Row Most Recent Value  Orbital Region Moderate depletion  Upper Arm Region Severe depletion  Thoracic and Lumbar Region Moderate depletion  Buccal Region Mild depletion  Temple Region Mild depletion  Clavicle Bone Region Moderate depletion  Clavicle and Acromion Bone Region Moderate depletion  Scapular Bone Region Moderate depletion  Dorsal Hand Moderate depletion  Patellar Region Severe depletion  Anterior Thigh Region Severe depletion  Posterior Calf Region Severe depletion  Edema (RD Assessment) None  Hair Reviewed  Eyes Reviewed  Mouth Reviewed  Skin Reviewed  Nails Reviewed   Diet Order:   Diet Order             DIET - DYS 1 Room service appropriate? No; Fluid consistency: Thin  Diet effective now                  EDUCATION NEEDS:   Education needs have been addressed (with pt's wife)  Skin:  Skin Assessment: Reviewed RN Assessment (ecchymosis)  Last BM:  4/14- type 5  Height:   Ht Readings from Last 1 Encounters:  11/09/22 5\' 10"  (1.778 m)    Weight:   Wt Readings from Last 1 Encounters:  11/13/22 70.1 kg    Ideal Body Weight:  75.45 kg  BMI:  Body mass index is 22.17 kg/m.  Estimated Nutritional Needs:   Kcal:  1800-2100kcal/day  Protein:  90-105g/day  Fluid:  1.8-2.1L/day  Betsey Holiday MS, RD, LDN Please refer to [facility] for RD and/or RD on-call/weekend/after hours pager

## 2022-11-14 DIAGNOSIS — R001 Bradycardia, unspecified: Secondary | ICD-10-CM | POA: Diagnosis not present

## 2022-11-14 DIAGNOSIS — F02C2 Dementia in other diseases classified elsewhere, severe, with psychotic disturbance: Secondary | ICD-10-CM | POA: Diagnosis not present

## 2022-11-14 DIAGNOSIS — R55 Syncope and collapse: Secondary | ICD-10-CM | POA: Diagnosis not present

## 2022-11-14 DIAGNOSIS — E43 Unspecified severe protein-calorie malnutrition: Secondary | ICD-10-CM | POA: Diagnosis not present

## 2022-11-14 DIAGNOSIS — G309 Alzheimer's disease, unspecified: Secondary | ICD-10-CM | POA: Diagnosis not present

## 2022-11-14 DIAGNOSIS — J302 Other seasonal allergic rhinitis: Secondary | ICD-10-CM | POA: Diagnosis not present

## 2022-11-14 DIAGNOSIS — M6281 Muscle weakness (generalized): Secondary | ICD-10-CM | POA: Diagnosis not present

## 2022-11-14 DIAGNOSIS — R2681 Unsteadiness on feet: Secondary | ICD-10-CM | POA: Diagnosis not present

## 2022-11-14 DIAGNOSIS — I25708 Atherosclerosis of coronary artery bypass graft(s), unspecified, with other forms of angina pectoris: Secondary | ICD-10-CM | POA: Diagnosis not present

## 2022-11-14 DIAGNOSIS — R404 Transient alteration of awareness: Secondary | ICD-10-CM | POA: Diagnosis not present

## 2022-11-14 DIAGNOSIS — R278 Other lack of coordination: Secondary | ICD-10-CM | POA: Diagnosis not present

## 2022-11-14 DIAGNOSIS — Z7401 Bed confinement status: Secondary | ICD-10-CM | POA: Diagnosis not present

## 2022-11-14 DIAGNOSIS — Z743 Need for continuous supervision: Secondary | ICD-10-CM | POA: Diagnosis not present

## 2022-11-14 DIAGNOSIS — I480 Paroxysmal atrial fibrillation: Secondary | ICD-10-CM | POA: Diagnosis not present

## 2022-11-14 DIAGNOSIS — R339 Retention of urine, unspecified: Secondary | ICD-10-CM | POA: Diagnosis not present

## 2022-11-14 DIAGNOSIS — I160 Hypertensive urgency: Secondary | ICD-10-CM | POA: Diagnosis not present

## 2022-11-14 DIAGNOSIS — R531 Weakness: Secondary | ICD-10-CM | POA: Diagnosis not present

## 2022-11-14 DIAGNOSIS — R262 Difficulty in walking, not elsewhere classified: Secondary | ICD-10-CM | POA: Diagnosis not present

## 2022-11-14 DIAGNOSIS — Z741 Need for assistance with personal care: Secondary | ICD-10-CM | POA: Diagnosis not present

## 2022-11-14 DIAGNOSIS — F02811 Dementia in other diseases classified elsewhere, unspecified severity, with agitation: Secondary | ICD-10-CM | POA: Diagnosis not present

## 2022-11-14 DIAGNOSIS — C642 Malignant neoplasm of left kidney, except renal pelvis: Secondary | ICD-10-CM | POA: Diagnosis not present

## 2022-11-14 DIAGNOSIS — H1033 Unspecified acute conjunctivitis, bilateral: Secondary | ICD-10-CM | POA: Diagnosis not present

## 2022-11-14 DIAGNOSIS — N183 Chronic kidney disease, stage 3 unspecified: Secondary | ICD-10-CM | POA: Diagnosis not present

## 2022-11-14 DIAGNOSIS — F0390 Unspecified dementia without behavioral disturbance: Secondary | ICD-10-CM | POA: Diagnosis not present

## 2022-11-14 DIAGNOSIS — R5383 Other fatigue: Secondary | ICD-10-CM | POA: Diagnosis not present

## 2022-11-14 DIAGNOSIS — F02C11 Dementia in other diseases classified elsewhere, severe, with agitation: Secondary | ICD-10-CM | POA: Diagnosis not present

## 2022-11-14 DIAGNOSIS — Z66 Do not resuscitate: Secondary | ICD-10-CM | POA: Diagnosis not present

## 2022-11-14 DIAGNOSIS — R410 Disorientation, unspecified: Secondary | ICD-10-CM | POA: Diagnosis not present

## 2022-11-14 DIAGNOSIS — D72829 Elevated white blood cell count, unspecified: Secondary | ICD-10-CM | POA: Diagnosis not present

## 2022-11-14 DIAGNOSIS — R251 Tremor, unspecified: Secondary | ICD-10-CM | POA: Diagnosis not present

## 2022-11-14 DIAGNOSIS — F03918 Unspecified dementia, unspecified severity, with other behavioral disturbance: Secondary | ICD-10-CM | POA: Diagnosis not present

## 2022-11-14 DIAGNOSIS — R0689 Other abnormalities of breathing: Secondary | ICD-10-CM | POA: Diagnosis not present

## 2022-11-14 DIAGNOSIS — I25119 Atherosclerotic heart disease of native coronary artery with unspecified angina pectoris: Secondary | ICD-10-CM | POA: Diagnosis not present

## 2022-11-14 DIAGNOSIS — D696 Thrombocytopenia, unspecified: Secondary | ICD-10-CM | POA: Diagnosis not present

## 2022-11-14 DIAGNOSIS — G301 Alzheimer's disease with late onset: Secondary | ICD-10-CM | POA: Diagnosis not present

## 2022-11-14 DIAGNOSIS — R2689 Other abnormalities of gait and mobility: Secondary | ICD-10-CM | POA: Diagnosis not present

## 2022-11-14 DIAGNOSIS — Z9181 History of falling: Secondary | ICD-10-CM | POA: Diagnosis not present

## 2022-11-14 DIAGNOSIS — I10 Essential (primary) hypertension: Secondary | ICD-10-CM | POA: Diagnosis not present

## 2022-11-14 DIAGNOSIS — N390 Urinary tract infection, site not specified: Secondary | ICD-10-CM | POA: Diagnosis not present

## 2022-11-14 MED ORDER — MELATONIN 5 MG PO TABS: 5.0000 mg | ORAL_TABLET | Freq: Every evening | ORAL | 0 refills | Status: DC

## 2022-11-14 MED ORDER — ISOSORBIDE MONONITRATE ER 60 MG PO TB24: 60.0000 mg | ORAL_TABLET | Freq: Every evening | ORAL | Status: DC

## 2022-11-14 MED ORDER — ENSURE ENLIVE PO LIQD: 237.0000 mL | Freq: Three times a day (TID) | ORAL | 12 refills | Status: DC

## 2022-11-14 MED ORDER — QUETIAPINE FUMARATE 50 MG PO TABS: 50.0000 mg | ORAL_TABLET | Freq: Two times a day (BID) | ORAL | Status: DC

## 2022-11-14 MED ORDER — TAMSULOSIN HCL 0.4 MG PO CAPS: 0.4000 mg | ORAL_CAPSULE | Freq: Every day | ORAL | Status: DC

## 2022-11-14 MED ORDER — CARBIDOPA-LEVODOPA 10-100 MG PO TABS: 1.0000 | ORAL_TABLET | Freq: Three times a day (TID) | ORAL | Status: DC

## 2022-11-14 NOTE — Care Management Important Message (Signed)
Important Message  Patient Details  Name: Zachary Hall MRN: [id number] Date of Birth: [birth year]   Medicare Important Message Given:  Yes  Reviewed Medicare IM with [name], spouse, at [phone].  Copy of Medicare IM left on bedside tray in room for reference.   [name] 11/14/2022, 10:28 AM

## 2022-11-14 NOTE — Consult Note (Signed)
   [facility] CM Inpatient Consult   11/14/2022  Zachary Hall March 16, 1935 [id number]  Location: [facility] RN Hospital Liaison screen remotely([facility]).   [facility] Customer service manager [facility]) Accountable Care Organization [ACO] Patient: Insurance [facility])    Primary Care Provider:  Elenore Paddy, NP [facility]  Patient screened readmission hospitalization with noted high risk score for unplanned readmission risk with 1 IP/2 ED in 6 months. [facility]/Population Health RN liaison will assess for potential [facility]) Care Management service needs for post hospital transition for care coordination. Pt will discharge today to [facility]. Will collaborate with PAC-RN concerning pt's discharge to STR (SNF).  Plan. HIPAA verified and [facility]/Population Health RN Liaison will continue to follow ongoing disposition in assessing for post hospital community care coordination/management needs.  Referral request for community care coordination: pending disposition.   [facility] Care Management/Population Health does not replace or interfere with any arrangements made by the Inpatient Transition of Care team.   For questions contact:   Elliot Cousin, RN, BSN [facility] Liaison [place]   [facility]  Population Health Office Hours MTWF 8:00 am to 6 pm off on Thursday [phone] mobile [phone] [Office toll free line][facility] Office Hours are M-F 8:30 - 5 pm 24 hour nurse advise line [phone] Conceirge  [email or web address]

## 2022-11-14 NOTE — Progress Notes (Signed)
Report called to [facility] . 

## 2022-11-14 NOTE — TOC Transition Note (Signed)
Transition of Care [facility]) - CM/SW Discharge Note   Patient Details  Name: Zachary Hall MRN: [id number] Date of Birth: [birth year]  [facility] of Care [facility]) CM/SW Contact:  Margarito Liner, LCSW Phone Number: 11/14/2022, 12:53 PM   Clinical Narrative:   Patient has orders to discharge to [facility] today. RN will call report to [phone] (Room 103). EMS transport has been arranged and he is 5th on the list. No further concerns. CSW signing off.  Final next level of care: Skilled Nursing Facility Barriers to Discharge: Barriers Resolved   Patient Goals and CMS Choice   Choice offered to / list presented to : Spouse  Discharge Placement PASRR number recieved: 11/13/22 PASRR number recieved: 11/13/22            Patient chooses bed at: [facility] Patient to be transferred to facility by: EMS Name of family member notified: [name] Patient and family notified of of transfer: 11/14/22  Discharge Plan and Services Additional resources added to the After Visit Summary for       Post Acute Care Choice: Home Health, Durable Medical Equipment          DME Arranged: Dan Humphreys rolling DME Agency: [employer]       HH Arranged: PT, OT, Respirator Therapy, Social Work [employer] Agency: [facility] Date HH Agency Contacted: 11/11/22 Time HH Agency Contacted: 0940 Representative spoke with at [facility] Agency: Barbara Cower  Social Determinants of Health (SDOH) Interventions SDOH Screenings   Food Insecurity: No Food Insecurity (11/09/2022)  Housing: Low Risk  (11/09/2022)  Transportation Needs: No Transportation Needs (11/09/2022)  Utilities: Not At Risk (11/09/2022)  Depression (PHQ2-9): Low Risk  (08/31/2022)  Tobacco Use: Medium Risk (11/09/2022)     Readmission Risk Interventions     No data to display

## 2022-11-14 NOTE — Discharge Summary (Signed)
[facility] Discharge Summary   Patient: CECILE GUEVARA ZOX:[id number]  PCP: Elenore Paddy, NP  Date of admission: 11/09/2022   Date of discharge:  11/14/2022     Discharge Diagnoses:  Principal Problem:   Syncope and collapse Active Problems:   Coronary artery disease of bypass graft of native heart with stable angina pectoris   History of atrial fibrillation   Sinus bradycardia with RBBB   RBBB (right bundle branch block)   Dementia   History of renal cell carcinoma s/p partial left nephrectomy 01/2021   Hypertensive urgency   Protein-calorie malnutrition, severe   Admitted From: home Disposition:  SNF   Recommendations for Outpatient Follow-up:  Follow-up with PCP, patient should be seen by an MD in 1 to 2 days, continue to monitor BP and titrate medication accordingly. Follow with neurology in 1 to 2 weeks, started Sinemet at low-dose for possibility of Parkinson disease. Foley catheter was inserted on 11/11/2022, keep Foley for 1 week and then DC for voiding trial.  Started Flomax.  Patient will benefit from follow-up with urology for recurrent urinary retention. Follow up LABS/TEST:     Contact information for after-discharge care     Destination     [facility] .   Service: Skilled Nursing Contact information: [address] [place] [address] [phone]                    Diet recommendation: Dysphagia type 1 thin Liquid  Activity: The patient is advised to gradually reintroduce usual activities, as tolerated  Discharge Condition: stable  Code Status: DNR   History of present illness: As per the H and P dictated on admission Hospital Course:  GUNNISON CHAHAL is a 88 y.o. male with medical history significant for CAD s/p CABG, dementia, left renal carcinoma s/p partial left nephrectomy 01/2021, RBBB with sinus bradycardia and no indication for pacemaker (per review of cardiology note from 11/02/2022), remote  history of paroxysmal A-fib, who was brought to the ED after a fall in the bathtub.  Wife at the bedside who gives most of the history says she heard a thud and when she rushed to the bathroom found him halfway in, halfway out of bathtub.  He was awake and alert and did not appear to have injuries.  Patient had fallen 3 times the day prior.  Wife is concerned that in the previous days he was constipated and she gave him a laxative and he had a few episodes of loose stool to where she had to place him in a diaper.  Otherwise he had been in his usual state of health. ED course and data review: BP 216/79, with bradycardia at 254 and otherwise normal vitals.  Labs unremarkable except for baseline anemia of 11.2.  CK normal at 86. CT head and C-spine nonacute.Clear  Chest x-ray and pelvic x-ray nonacute. EKG, personally viewed and interpreted shows sinus bradycardia at 60 with RBBB and LAFB.  Patient was treated with a 500 mL sodium chloride bolus, given hydralazine 5 mg IV for BP with improvement 158/87 by admission. Hospitalist consulted for admission.      Assessment and Plan: # Syncope and collapse could be due to sinus bradycardia, hypertensive urgency caused by urinary tension. Sinus bradycardia with RBBB, No evidence of injury on imaging studies Workup unrevealing. Some concern for symptomatic bradycardia as etiology of falls.  Troponin negative.  Patient was monitored on telemetry, no significant events noticed.  Heart  rate improved. Received a 1 L NS bolus in the ED. Avoid AV nodal agents for BP control.  Continue fall precautions. # Hypertensive urgency, SBP over 200 on arrival, improving after IV hydralazine 5 mg suspect due to urinary retention. Continue losartan 50 and amlodipine 10 mg POD, home dose. On 4/12 started Imdur 60 mg p.o. every evening. Monitor BP and titrate medication accordingly # Dementia and acute delirium, Not on any medication. Continue Delirium precautions. Patient has never  been diagnosed with Parkinson disease but patient wife did notice resting tremors.  On exam patient has bilateral upper and lower extremity rigidity it could be the part of dementia. Patient's wife agreed to try Sinemet. Started Sinemet low-dose 3 times daily, we will continue to monitor if there is any improvement. started Seroquel 50 mg p.o. twice daily for sundowning and delirium.  Started melatonin 5 mg nightly due to insomnia # History of paroxysmal atrial fibrillation, Not currently on systemic anticoagulation, rate is controlled, patient was noticed to have bradycardia.  EKG shows sinus rhythm with RBBB and left intrafascicular block, No acute ST/T wave changes. # Coronary artery disease of bypass graft of native heart with stable angina pectoris, Troponin was negative at 11 and EKG showed no ischemic changes. Continue atorvastatin 5 mg, losartan and nitroglycerin sublingual as needed # rinary retention, Foley catheter was inserted on 4/13, bladder scan showed >700 mL urine.  Started Flomax. Patient needs to follow with urology as an outpatient for voiding trial after 1 week # History of renal cell carcinoma s/p partial left nephrectomy 01/2021 No acute disused suspected  Body mass index is 22.17 kg/m.  Nutrition Problem: Severe Malnutrition Etiology: chronic illness (renal cancer, dementia) Nutrition Interventions:    Patient was seen by physical therapy, who recommended Therapy, SNF placement, which was arranged. On the day of the discharge the patient's vitals were stable, and no other acute medical condition were reported by patient. the patient was felt safe to be discharge at [facility].  Consultants: None Procedures: None  Discharge Exam: General: Appear in no distress, no Rash; Oral Mucosa Clear, moist. Cardiovascular: S1 and S2 Present, no Murmur, Respiratory: normal respiratory effort, Bilateral Air entry present and no Crackles, no wheezes Abdomen: Bowel Sound present, Soft and no  tenderness, no hernia Extremities: no Pedal edema, no calf tenderness Neurology: alert and oriented to time, place, and person affect appropriate.  Filed Weights   11/10/22 0500 11/11/22 0547 11/13/22 1650  Weight: 71.8 kg 55.4 kg 70.1 kg   Vitals:   11/14/22 0524 11/14/22 0923  BP: (!) 147/77 137/75  Pulse: 69 69  Resp: 20   Temp: 98.1 F (36.7 C) 97.9 F (36.6 C)  SpO2: 98%     DISCHARGE MEDICATION: Allergies as of 11/14/2022       Reactions   Lexapro [escitalopram]    Confusion, weakness   Namenda [memantine]    Insomnia   Lisinopril Other (See Comments)   Cough        Medication List     STOP taking these medications    cetirizine 10 MG tablet Commonly known as: ZYRTEC   Melatonin 1 MG Caps Replaced by: melatonin 5 MG Tabs   traMADol 50 MG tablet Commonly known as: Ultram       TAKE these medications    amLODipine 10 MG tablet Commonly known as: NORVASC TAKE 1 TABLET BY MOUTH EVERY DAY   ammonium lactate 12 % cream Commonly known as: AMLACTIN Apply 1 g topically as needed  for dry skin.   atorvastatin 10 MG tablet Commonly known as: LIPITOR Take 0.5 tablets (5 mg total) by mouth daily at 6 PM.   carbidopa-levodopa 10-100 MG tablet Commonly known as: SINEMET IR Take 1 tablet by mouth 3 (three) times daily.   docusate sodium 100 MG capsule Commonly known as: COLACE Take 1 capsule (100 mg total) by mouth 2 (two) times daily.   feeding supplement Liqd Take 237 mLs by mouth 3 (three) times daily between meals.   isosorbide mononitrate 60 MG 24 hr tablet Commonly known as: IMDUR Take 1 tablet (60 mg total) by mouth every evening. Skip the dose if systolic BP less than 140 mmHg   losartan 50 MG tablet Commonly known as: COZAAR TAKE 1 TABLET BY MOUTH EVERY DAY   MAGNESIA PO Take by mouth.   melatonin 5 MG Tabs Take 1 tablet (5 mg total) by mouth every evening. Replaces: Melatonin 1 MG Caps   multivitamin tablet Take 1 tablet by  mouth daily.   mupirocin ointment 2 % Commonly known as: BACTROBAN Apply 1 application  topically 2 (two) times daily.   nitroGLYCERIN 0.4 MG SL tablet Commonly known as: NITROSTAT *PLACE 1 TABLET UNDER TONGUE EVERY 5 MINS., UP TO 3 DOSES AS NEEDED FOR CHEST PAIN*   omeprazole 20 MG capsule Commonly known as: PRILOSEC Take 20 mg by mouth 2 (two) times daily.   promethazine 12.5 MG tablet Commonly known as: PHENERGAN Take 1 tablet (12.5 mg total) by mouth every 4 (four) hours as needed for nausea or vomiting.   QUEtiapine 50 MG tablet Commonly known as: SEROQUEL Take 1 tablet (50 mg total) by mouth 2 (two) times daily. Skip the dose of patient is sleepy and no signs or symptoms of agitation or delirium and sundowning   tamsulosin 0.4 MG Caps capsule Commonly known as: FLOMAX Take 1 capsule (0.4 mg total) by mouth daily after supper.               Durable Medical Equipment  (From admission, onward)           Start     Ordered   11/10/22 1639  For home use only DME Walker  Once       Question:  Patient needs a walker to treat with the following condition  Answer:  Ambulatory dysfunction   11/10/22 1639           Allergies  Allergen Reactions   Lexapro [Escitalopram]     Confusion, weakness   Namenda [Memantine]     Insomnia   Lisinopril Other (See Comments)    Cough   Discharge Instructions     Call MD for:  difficulty breathing, headache or visual disturbances   Complete by: As directed    Call MD for:  extreme fatigue   Complete by: As directed    Call MD for:  persistant dizziness or light-headedness   Complete by: As directed    Call MD for:  persistant nausea and vomiting   Complete by: As directed    Call MD for:  severe uncontrolled pain   Complete by: As directed    Call MD for:  temperature >100.4   Complete by: As directed    Discharge instructions   Complete by: As directed    Follow-up with PCP, patient should be seen by an MD in 1  to 2 days, continue to monitor BP and titrate medication accordingly. Follow with neurology in 1 to 2 weeks, started  Sinemet at low-dose for possibility of Parkinson disease. Foley catheter was inserted on 11/11/2022, keep Foley for 1 week and then DC for voiding trial.  Started Flomax.  Patient will benefit from follow-up with urology for recurrent urinary retention.   Increase activity slowly   Complete by: As directed        The results of significant diagnostics from this hospitalization (including imaging, microbiology, ancillary and laboratory) are listed below for reference.    Significant Diagnostic Studies: DG Pelvis Portable  Result Date: 11/09/2022 CLINICAL DATA:  Recent fall with pelvic pain, initial encounter EXAM: PORTABLE PELVIS 1 VIEWS COMPARISON:  12/14/2020 FINDINGS: Pelvic ring is intact. Mild retained fecal material is noted. No obstructive changes are seen. No acute fracture or dislocation is noted. Penile prosthesis is seen. IMPRESSION: No acute bony abnormality noted. Electronically Signed   By: Alcide Clever M.D.   On: 11/09/2022 01:38   DG Chest Port 1 View  Result Date: 11/09/2022 CLINICAL DATA:  Recent fall, initial encounter EXAM: PORTABLE CHEST 1 VIEW COMPARISON:  10/31/2022 FINDINGS: Cardiac shadow is stable. Postsurgical changes are again seen. Lungs are well aerated bilaterally. No focal infiltrate or effusion is noted. No bony abnormality is noted. IMPRESSION: No active disease. Electronically Signed   By: Alcide Clever M.D.   On: 11/09/2022 01:37   CT Cervical Spine Wo Contrast  Result Date: 11/09/2022 CLINICAL DATA:  Patient found down. EXAM: CT CERVICAL SPINE WITHOUT CONTRAST TECHNIQUE: Multidetector CT imaging of the cervical spine was performed without intravenous contrast. Multiplanar CT image reconstructions were also generated. RADIATION DOSE REDUCTION: This exam was performed according to the departmental dose-optimization program which includes automated  exposure control, adjustment of the mA and/or kV according to patient size and/or use of iterative reconstruction technique. COMPARISON:  October 31, 2022 FINDINGS: Alignment: There is approximately 1 mm anterolisthesis of the C7 vertebral body on T1. Skull base and vertebrae: No acute fracture. No primary bone lesion or focal pathologic process. Soft tissues and spinal canal: No prevertebral fluid or swelling. No visible canal hematoma. Disc levels: Mild endplate sclerosis, mild to moderate severity anterior osteophyte formation and moderate severity posterior bony spurring are seen at the levels of C2-C3, C3-C4, C4-C5, C5-C6 and C6-C7. Mild to moderate severity multilevel intervertebral disc space narrowing is seen throughout all levels of the cervical spine. Bilateral moderate severity multilevel facet joint hypertrophy is noted. Upper chest: Negative. Other: None. IMPRESSION: 1. No acute fracture within the cervical spine. 2. Moderate severity multilevel degenerative changes. 3. Approximately 1 mm anterolisthesis of the C7 vertebral body on T1. Electronically Signed   By: Aram Candela M.D.   On: 11/09/2022 01:33   CT Head Wo Contrast  Result Date: 11/09/2022 CLINICAL DATA:  Patient found down. EXAM: CT HEAD WITHOUT CONTRAST TECHNIQUE: Contiguous axial images were obtained from the base of the skull through the vertex without intravenous contrast. RADIATION DOSE REDUCTION: This exam was performed according to the departmental dose-optimization program which includes automated exposure control, adjustment of the mA and/or kV according to patient size and/or use of iterative reconstruction technique. COMPARISON:  October 31, 2022 FINDINGS: Brain: There is moderate severity cerebral atrophy with widening of the extra-axial spaces and ventricular dilatation. There are areas of decreased attenuation within the white matter tracts of the supratentorial brain, consistent with microvascular disease changes. Vascular:  No hyperdense vessel or unexpected calcification. Skull: Normal. Negative for fracture or focal lesion. Sinuses/Orbits: Chronic and postoperative changes are seen involving the medial walls of the  bilateral maxillary sinuses. Other: None. IMPRESSION: 1. No acute intracranial abnormality. 2. Cerebral atrophy and microvascular disease changes of the supratentorial brain. Electronically Signed   By: Aram Candela M.D.   On: 11/09/2022 01:30   CT CHEST WO CONTRAST  Result Date: 10/31/2022 CLINICAL DATA:  Chest trauma, blunt.  Left back pain EXAM: CT CHEST WITHOUT CONTRAST TECHNIQUE: Multidetector CT imaging of the chest was performed following the standard protocol without IV contrast. RADIATION DOSE REDUCTION: This exam was performed according to the departmental dose-optimization program which includes automated exposure control, adjustment of the mA and/or kV according to patient size and/or use of iterative reconstruction technique. COMPARISON:  CT abdomen pelvis 11/19/2020 FINDINGS: Cardiovascular: Coronary artery bypass grafting has been performed. Global cardiac size within normal limits. No pericardial effusion. Central pulmonary arteries are enlarged in keeping with changes of pulmonary arterial hypertension. Moderate atherosclerotic calcification within the thoracic aorta. Mild dilation of the ascending thoracic aorta which measures 4.1 cm in greatest dimension. Descending thoracic aorta is of normal caliber. Aberrant right subclavian artery. Mediastinum/Nodes: No enlarged mediastinal or axillary lymph nodes. Thyroid gland, trachea, and esophagus demonstrate no significant findings. Lungs/Pleura: 1.5 x 2.8 cm pulmonary mass has developed within the right posterior basal lower lobe within the costophrenic sulcus. Lungs are otherwise clear. No pneumothorax or pleural effusion. Upper Abdomen: No acute abnormality. Musculoskeletal: Osseous structures are age-appropriate. No acute bone abnormality. No lytic  or blastic bone lesion. IMPRESSION: 1. No acute intrathoracic pathology identified. 2. Solid pulmonary nodule measuring 28 mm. Per Fleischner Society Guidelines, recommend a PET/CT or tissue sampling. These guidelines do not apply to immunocompromised patients and patients with cancer. Follow up in patients with significant comorbidities as clinically warranted. For lung cancer screening, adhere to Lung-RADS guidelines. Reference: Radiology. 2017; 284(1):228-43. 3. Morphologic changes in keeping with pulmonary arterial hypertension. 4. 4.1 cm ascending thoracic aortic aneurysm. Recommend annual imaging followup by CTA or MRA. This recommendation follows 2010 ACCF/AHA/AATS/ACR/ASA/SCA/SCAI/SIR/STS/SVM Guidelines for the Diagnosis and Management of Patients with Thoracic Aortic Disease. Circulation. 2010; 121: W098-J191. Aortic aneurysm NOS (ICD10-I71.9) Aortic Atherosclerosis (ICD10-I70.0). Aortic aneurysm NOS (ICD10-I71.9). Electronically Signed   By: Helyn Numbers M.D.   On: 10/31/2022 01:36   CT Head Wo Contrast  Result Date: 10/31/2022 CLINICAL DATA:  Head trauma, intracranial venous injury suspected EXAM: CT HEAD WITHOUT CONTRAST CT CERVICAL SPINE WITHOUT CONTRAST TECHNIQUE: Multidetector CT imaging of the head and cervical spine was performed following the standard protocol without intravenous contrast. Multiplanar CT image reconstructions of the cervical spine were also generated. RADIATION DOSE REDUCTION: This exam was performed according to the departmental dose-optimization program which includes automated exposure control, adjustment of the mA and/or kV according to patient size and/or use of iterative reconstruction technique. COMPARISON:  None Available. FINDINGS: CT HEAD FINDINGS Brain: Normal anatomic configuration. Parenchymal volume loss is commensurate with the patient's age. Stable moderate periventricular white matter changes are present likely reflecting the sequela of small vessel ischemia.  No abnormal intra or extra-axial mass lesion or fluid collection. No abnormal mass effect or midline shift. No evidence of acute intracranial hemorrhage or infarct. Ventricular size is commensurate with the degree of parenchymal volume loss. Cerebellum unremarkable. Vascular: No asymmetric hyperdense vasculature at the skull base. Skull: Intact Sinuses/Orbits: Extensive paranasal sinus surgery has been performed with bilateral ethmoidectomy, sphenoid ectomy, middle turbinectomy, and maxillary antrectomy. Orbits are unremarkable. Other: Mastoid air cells and middle ear cavities are clear. CT CERVICAL SPINE FINDINGS Alignment: Normal. Skull base and vertebrae: No acute fracture. No  primary bone lesion or focal pathologic process. Soft tissues and spinal canal: No prevertebral fluid or swelling. No visible canal hematoma. Extensive atherosclerotic calcification within the carotid bifurcations bilaterally. Disc levels: Intervertebral disc space narrowing and endplate remodeling throughout the cervical spine is in keeping with changes of diffuse moderate to severe degenerative disc disease. Prevertebral soft tissues are not thickened on sagittal reformats. Posterior disc herniations at C3-4, C4-5, C5-6, and C6-7 result in mild central canal stenosis, most severe at C4-5. Multilevel uncovertebral and facet arthrosis results in multilevel moderate neuroforaminal narrowing, most severe bilaterally at C3-C6 Upper chest: Negative. Other: None IMPRESSION: 1. No acute intracranial abnormality. No calvarial fracture. 2. Stable senescent change. 3. No acute fracture or listhesis of the cervical spine. 4. Advanced multilevel degenerative disc and degenerative joint disease resulting in multilevel mild central canal stenosis and moderate bilateral neuroforaminal narrowing, most severe at C3-C6. 5. Extensive atherosclerotic calcification within the carotid bifurcations bilaterally. Electronically Signed   By: Helyn Numbers M.D.    On: 10/31/2022 01:20   CT Cervical Spine Wo Contrast  Result Date: 10/31/2022 CLINICAL DATA:  Head trauma, intracranial venous injury suspected EXAM: CT HEAD WITHOUT CONTRAST CT CERVICAL SPINE WITHOUT CONTRAST TECHNIQUE: Multidetector CT imaging of the head and cervical spine was performed following the standard protocol without intravenous contrast. Multiplanar CT image reconstructions of the cervical spine were also generated. RADIATION DOSE REDUCTION: This exam was performed according to the departmental dose-optimization program which includes automated exposure control, adjustment of the mA and/or kV according to patient size and/or use of iterative reconstruction technique. COMPARISON:  None Available. FINDINGS: CT HEAD FINDINGS Brain: Normal anatomic configuration. Parenchymal volume loss is commensurate with the patient's age. Stable moderate periventricular white matter changes are present likely reflecting the sequela of small vessel ischemia. No abnormal intra or extra-axial mass lesion or fluid collection. No abnormal mass effect or midline shift. No evidence of acute intracranial hemorrhage or infarct. Ventricular size is commensurate with the degree of parenchymal volume loss. Cerebellum unremarkable. Vascular: No asymmetric hyperdense vasculature at the skull base. Skull: Intact Sinuses/Orbits: Extensive paranasal sinus surgery has been performed with bilateral ethmoidectomy, sphenoid ectomy, middle turbinectomy, and maxillary antrectomy. Orbits are unremarkable. Other: Mastoid air cells and middle ear cavities are clear. CT CERVICAL SPINE FINDINGS Alignment: Normal. Skull base and vertebrae: No acute fracture. No primary bone lesion or focal pathologic process. Soft tissues and spinal canal: No prevertebral fluid or swelling. No visible canal hematoma. Extensive atherosclerotic calcification within the carotid bifurcations bilaterally. Disc levels: Intervertebral disc space narrowing and endplate  remodeling throughout the cervical spine is in keeping with changes of diffuse moderate to severe degenerative disc disease. Prevertebral soft tissues are not thickened on sagittal reformats. Posterior disc herniations at C3-4, C4-5, C5-6, and C6-7 result in mild central canal stenosis, most severe at C4-5. Multilevel uncovertebral and facet arthrosis results in multilevel moderate neuroforaminal narrowing, most severe bilaterally at C3-C6 Upper chest: Negative. Other: None IMPRESSION: 1. No acute intracranial abnormality. No calvarial fracture. 2. Stable senescent change. 3. No acute fracture or listhesis of the cervical spine. 4. Advanced multilevel degenerative disc and degenerative joint disease resulting in multilevel mild central canal stenosis and moderate bilateral neuroforaminal narrowing, most severe at C3-C6. 5. Extensive atherosclerotic calcification within the carotid bifurcations bilaterally. Electronically Signed   By: Helyn Numbers M.D.   On: 10/31/2022 01:20   DG Chest Port 1 View  Result Date: 10/31/2022 CLINICAL DATA:  Dizziness. EXAM: PORTABLE CHEST 1 VIEW COMPARISON:  Chest  radiograph dated 09/28/2022. FINDINGS: Faint lucency along the right lateral pleural surface may be artifactual and related to skin fold. A pneumothorax is less likely but not excluded. Clinical correlation and repeat radiograph after repositioning of the patient recommended. No focal consolidation, or pleural effusion. Top-normal cardiac size. Median sternotomy wires and CABG vascular clips. No acute osseous pathology. IMPRESSION: Skin fold artifact versus less likely a right-sided pneumothorax. Electronically Signed   By: Elgie Collard M.D.   On: 10/31/2022 00:45    Microbiology: Recent Results (from the past 240 hour(s))  Resp panel by RT-PCR (RSV, Flu A&B, Covid) Urine, Clean Catch     Status: None   Collection Time: 11/09/22  2:01 AM   Specimen: Urine, Clean Catch; Nasal Swab  Result Value Ref Range Status    SARS Coronavirus 2 by RT PCR NEGATIVE NEGATIVE Final    Comment: (NOTE) SARS-CoV-2 target nucleic acids are NOT DETECTED.  The SARS-CoV-2 RNA is generally detectable in upper respiratory specimens during the acute phase of infection. The lowest concentration of SARS-CoV-2 viral copies this assay can detect is 138 copies/mL. A negative result does not preclude SARS-Cov-2 infection and should not be used as the sole basis for treatment or other patient management decisions. A negative result may occur with  improper specimen collection/handling, submission of specimen other than nasopharyngeal swab, presence of viral mutation(s) within the areas targeted by this assay, and inadequate number of viral copies(<138 copies/mL). A negative result must be combined with clinical observations, patient history, and epidemiological information. The expected result is Negative.  Fact Sheet for Patients:  BloggerCourse.com  Fact Sheet for Healthcare Providers:  SeriousBroker.it  This test is no t yet approved or cleared by the [place] FDA and  has been authorized for detection and/or diagnosis of SARS-CoV-2 by FDA under an Emergency Use Authorization (EUA). This EUA will remain  in effect (meaning this test can be used) for the duration of the COVID-19 declaration under Section 564(b)(1) of the Act, 21 U.S.C.section 360bbb-3(b)(1), unless the authorization is terminated  or revoked sooner.       Influenza A by PCR NEGATIVE NEGATIVE Final   Influenza B by PCR NEGATIVE NEGATIVE Final    Comment: (NOTE) The Xpert Xpress SARS-CoV-2/FLU/RSV plus assay is intended as an aid in the diagnosis of influenza from Nasopharyngeal swab specimens and should not be used as a sole basis for treatment. Nasal washings and aspirates are unacceptable for Xpert Xpress SARS-CoV-2/FLU/RSV testing.  Fact Sheet for  Patients: BloggerCourse.com  Fact Sheet for Healthcare Providers: SeriousBroker.it  This test is not yet approved or cleared by the [place] FDA and has been authorized for detection and/or diagnosis of SARS-CoV-2 by FDA under an Emergency Use Authorization (EUA). This EUA will remain in effect (meaning this test can be used) for the duration of the COVID-19 declaration under Section 564(b)(1) of the Act, 21 U.S.C. section 360bbb-3(b)(1), unless the authorization is terminated or revoked.     Resp Syncytial Virus by PCR NEGATIVE NEGATIVE Final    Comment: (NOTE) Fact Sheet for Patients: BloggerCourse.com  Fact Sheet for Healthcare Providers: SeriousBroker.it  This test is not yet approved or cleared by the [place] FDA and has been authorized for detection and/or diagnosis of SARS-CoV-2 by FDA under an Emergency Use Authorization (EUA). This EUA will remain in effect (meaning this test can be used) for the duration of the COVID-19 declaration under Section 564(b)(1) of the Act, 21 U.S.C. section 360bbb-3(b)(1), unless the authorization is terminated  or revoked.  Performed at [facility], [address] [place], [place] [address]      Labs: CBC: Recent Labs  Lab 11/09/22 0103 11/10/22 0336 11/11/22 0416 11/13/22 0600  WBC 10.1 7.9 9.4 7.8  NEUTROABS 7.4  --   --   --   HGB 11.2* 12.0* 10.8* 10.5*  HCT 33.9* 35.1* 31.9* 31.1*  MCV 101.5* 99.7 100.6* 100.3*  PLT 155 170 173 163   Basic Metabolic Panel: Recent Labs  Lab 11/09/22 0103 11/10/22 0336 11/11/22 0416 11/13/22 0600  NA 139 140 137 141  K 3.6 3.5 3.4* 3.8  CL 108 106 106 107  CO2 GLUCOSE 107* 102* 114* 118*  BUN 32* 23 25* 31*  CREATININE 1.10 0.99 1.16 0.96  CALCIUM 8.8* 9.0 8.8* 9.0  MG 2.0 2.0 2.0 2.2  PHOS  --  3.3 3.6 3.6   Liver Function Tests: Recent Labs   Lab 11/09/22 0103  AST 18  ALT 13  ALKPHOS 59  BILITOT 0.8  PROT 6.3*  ALBUMIN 3.9   No results for input(s): "LIPASE", "AMYLASE" in the last 168 hours. No results for input(s): "AMMONIA" in the last 168 hours. Cardiac Enzymes: Recent Labs  Lab 11/09/22 0103  CKTOTAL 86   BNP (last 3 results) No results for input(s): "BNP" in the last 8760 hours. CBG: Recent Labs  Lab 11/09/22 0611 11/11/22 0540  GLUCAP 94 115*    Time spent: 35 minutes  Signed:  Gillis Santa  [facility]  11/14/2022 11:20 AM

## 2022-11-15 ENCOUNTER — Non-Acute Institutional Stay (SKILLED_NURSING_FACILITY): Payer: Medicare Other | Admitting: Student

## 2022-11-15 ENCOUNTER — Encounter: Payer: Self-pay | Admitting: Student

## 2022-11-15 DIAGNOSIS — R339 Retention of urine, unspecified: Secondary | ICD-10-CM

## 2022-11-15 DIAGNOSIS — N183 Chronic kidney disease, stage 3 unspecified: Secondary | ICD-10-CM

## 2022-11-15 DIAGNOSIS — I160 Hypertensive urgency: Secondary | ICD-10-CM | POA: Diagnosis not present

## 2022-11-15 DIAGNOSIS — R001 Bradycardia, unspecified: Secondary | ICD-10-CM

## 2022-11-15 DIAGNOSIS — C642 Malignant neoplasm of left kidney, except renal pelvis: Secondary | ICD-10-CM | POA: Diagnosis not present

## 2022-11-15 DIAGNOSIS — I25708 Atherosclerosis of coronary artery bypass graft(s), unspecified, with other forms of angina pectoris: Secondary | ICD-10-CM

## 2022-11-15 DIAGNOSIS — F0280 Dementia in other diseases classified elsewhere without behavioral disturbance: Secondary | ICD-10-CM

## 2022-11-15 DIAGNOSIS — D696 Thrombocytopenia, unspecified: Secondary | ICD-10-CM

## 2022-11-15 DIAGNOSIS — G309 Alzheimer's disease, unspecified: Secondary | ICD-10-CM | POA: Diagnosis not present

## 2022-11-15 NOTE — Progress Notes (Signed)
Provider:  Dr. Earnestine Mealing Location:  Other [place].  Nursing Home Room Number: [facility] 103A Place of Service:  SNF (31)  PCP: Elenore Paddy, NP Patient Care Team: Elenore Paddy, NP as PCP - General (Nurse Practitioner) [place], Peter M, MD as PCP - Cardiology (Cardiology)  Extended Emergency Contact Information Primary Emergency Contact: [facility] Address: [address]          [place], [place] [address] Darden Amber of [place] Home Phone: [phone] Mobile Phone: [phone] Relation: Spouse  Code Status: DNR Goals of Care: Advanced Directive information    11/15/2022    8:33 AM  Advanced Directives  Does Patient Have a Medical Advance Directive? Yes  Type of Estate agent of [place];Out of facility DNR (pink MOST or yellow form);Living will  Does patient want to make changes to medical advance directive? No - Patient declined  Copy of Healthcare Power of Attorney in Chart? Yes - validated most recent copy scanned in chart (See row information)     HPI: Patient is a 88 y.o. male seen today for admission to  He had a fall. Spent a night in the ED> Dr. Lucianne Muss put him on the Seroquel and melatonin at the hospital.. He goes to [employer] and he was on memantine.  His wife  is here helping with hisotry. He has been taking melatonin for a while due to sundowning (usually starts at 4:30).   In the last 6 months she started to dress him. They have a carriage house and a home -- but they are mostly living there. They are getting it handicapp accessible. They have renovating the bathroom. He was communicating. He was starting to choke some. He would tell her he loved her and would communicate some things.  He was diagnosed in 2017 with dementia. She started to notice bits of memory changes since 2001 when he had the cardiac changes. He stopped driving 1.5 years ago. She has to dress him. She has to help with shower time.  That was a smooth  transition. He started lexapro- this increasd his hallucinations. She stopped giving him the medication after 2 weeks.  February 29 he was seen in ED for weakness. March had numerous falls. EMS was helping him get up. The last fall he fell in th esoaker tub and it has made him more issues. He has had more back painbecause of the back.   Couple of weeks ago had fecal impaction. He wasn't having good bwoel movements. He was on his regimen.   He is not a good eater -- h estarted to taper off of everything.   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  Past Medical History:  Diagnosis Date   Arthritis    Balance problem    Barrett esophagus    Bradycardia    Coronary artery disease    GERD (gastroesophageal reflux disease)    Gout    History of atrial fibrillation    Previously on amiodarone   Hx of CABG    Hypercholesterolemia    Hypertension    Left renal mass    Melanoma    Left shoulder blade   Nocturia    Right epiretinal membrane 02/10/2020   Past Surgical History:  Procedure Laterality Date   CARDIAC CATHETERIZATION  01/04/2010   COLONOSCOPY     CORONARY ARTERY BYPASS GRAFT  02/07/2010   LIMA to LAD, SVG to 2nd DX, SVG to OM   EYE SURGERY Bilateral  Cataract Extraction   HEMORRHOID SURGERY     x 2   HEMORRHOID SURGERY N/A 11/13/2012   Procedure: PPH Hemorrhoidectomy;  Surgeon: Robyne Askew, MD;  Location: [facility] OR;  Service: General;  Laterality: N/A;   INGUINAL HERNIA REPAIR Right 01/22/2017   Procedure: RIGHT INGUINAL HERNIA REPAIR WITH MESH;  Surgeon: Darnell Level, MD;  Location: [facility] OR;  Service: General;  Laterality: Right;   INSERTION OF MESH Right 01/22/2017   Procedure: INSERTION OF MESH;  Surgeon: Darnell Level, MD;  Location: [facility] OR;  Service: General;  Laterality: Right;   IR RADIOLOGIST EVAL & MGMT  01/06/2021   NASAL POLYP EXCISION     OTHER SURGICAL HISTORY  06/05/2002   Penile Implant   ROBOTIC ASSITED PARTIAL NEPHRECTOMY Left 02/25/2021   Procedure: XI ROBOTIC  ASSITED PARTIAL NEPHRECTOMY;  Surgeon: Rene Paci, MD;  Location: [facility] ORS;  Service: Urology;  Laterality: Left;   UPPER GI ENDOSCOPY      reports that he quit smoking about 61 years ago. His smoking use included cigarettes. He has quit using smokeless tobacco. He reports current alcohol use of about 4.0 standard drinks of alcohol per week. He reports that he does not use drugs. Social History   Socioeconomic History   Marital status: Married    Spouse name: Not on file   Number of children: 1   Years of education: Not on file   Highest education level: Not on file  Occupational History   Occupation: Retired  Tobacco Use   Smoking status: Former    Types: Cigarettes    Quit date: 01/07/1961    Years since quitting: 61.8   Smokeless tobacco: Former  Building services engineer Use: Never used  Substance and Sexual Activity   Alcohol use: Yes    Alcohol/week: 4.0 standard drinks of alcohol    Types: 4 Cans of beer per week   Drug use: No   Sexual activity: Not on file  Other Topics Concern   Not on file  Social History Narrative   Right handed   Caffeine prn   Two story home   Retired   Lives with wife   Social Determinants of Health   Financial Resource Strain: Not on file  Food Insecurity: No Food Insecurity (11/09/2022)   Hunger Vital Sign    Worried About Running Out of Food in the Last Year: Never true    Ran Out of Food in the Last Year: Never true  Transportation Needs: No Transportation Needs (11/09/2022)   PRAPARE - Administrator, Civil Service (Medical): No    Lack of Transportation (Non-Medical): No  Physical Activity: Not on file  Stress: Not on file  Social Connections: Not on file  Intimate Partner Violence: Not At Risk (11/09/2022)   Humiliation, Afraid, Rape, and Kick questionnaire    Fear of Current or Ex-Partner: No    Emotionally Abused: No    Physically Abused: No    Sexually Abused: No    Functional Status Survey:     Family History  Problem Relation Age of Onset   Alcohol abuse Father    Colon cancer Mother    Cancer Mother        colon and bone   Heart attack Neg Hx    Hypertension Neg Hx    Stroke Neg Hx     Health Maintenance  Topic Date Due   Pneumonia Vaccine 84+ Years old (1 of 2 - PCV)  Never done   Zoster Vaccines- Shingrix (1 of 2) Never done   INFLUENZA VACCINE  03/01/2023   DTaP/Tdap/Td (2 - Td or Tdap) 11/26/2031   HPV VACCINES  Aged Out   COVID-19 Vaccine  Discontinued    Allergies  Allergen Reactions   Lexapro [Escitalopram]     Confusion, weakness   Namenda [Memantine]     Insomnia   Lisinopril Other (See Comments)    Cough    Outpatient Encounter Medications as of 11/15/2022  Medication Sig   amLODipine (NORVASC) 10 MG tablet TAKE 1 TABLET BY MOUTH EVERY DAY   ammonium lactate (AMLACTIN) 12 % cream Apply 1 g topically as needed for dry skin.   atorvastatin (LIPITOR) 10 MG tablet Take 0.5 tablets (5 mg total) by mouth daily at 6 PM.   carbidopa-levodopa (SINEMET IR) 10-100 MG tablet Take 1 tablet by mouth 3 (three) times daily.   docusate sodium (COLACE) 100 MG capsule Take 1 capsule (100 mg total) by mouth 2 (two) times daily.   feeding supplement (ENSURE ENLIVE / ENSURE PLUS) LIQD Take 237 mLs by mouth 3 (three) times daily between meals.   isosorbide mononitrate (IMDUR) 60 MG 24 hr tablet Take 1 tablet (60 mg total) by mouth every evening. Skip the dose if systolic BP less than 140 mmHg   losartan (COZAAR) 50 MG tablet TAKE 1 TABLET BY MOUTH EVERY DAY   melatonin 5 MG TABS Take 1 tablet (5 mg total) by mouth every evening.   Multiple Vitamin (MULTIVITAMIN) tablet Take 1 tablet by mouth daily.   mupirocin ointment (BACTROBAN) 2 % Apply 1 application  topically 2 (two) times daily.   nitroGLYCERIN (NITROSTAT) 0.4 MG SL tablet *PLACE 1 TABLET UNDER TONGUE EVERY 5 MINS., UP TO 3 DOSES AS NEEDED FOR CHEST PAIN*   omeprazole (PRILOSEC) 20 MG capsule Take 20 mg by mouth 2  (two) times daily.   promethazine (PHENERGAN) 12.5 MG tablet Take 1 tablet (12.5 mg total) by mouth every 4 (four) hours as needed for nausea or vomiting.   QUEtiapine (SEROQUEL) 50 MG tablet Take 1 tablet (50 mg total) by mouth 2 (two) times daily. Skip the dose of patient is sleepy and no signs or symptoms of agitation or delirium and sundowning   tamsulosin (FLOMAX) 0.4 MG CAPS capsule Take 1 capsule (0.4 mg total) by mouth daily after supper.   [DISCONTINUED] Magnesium Hydroxide (MAGNESIA PO) Take by mouth.   No facility-administered encounter medications on file as of 11/15/2022.    Review of Systems  Vitals:   11/15/22 0828 11/15/22 0835  BP: (!) 142/76 (!) 142/76  Pulse: 68   Resp: (!) 24   Temp: 98.2 F (36.8 C)   SpO2: 96%   Weight: 140 lb (63.5 kg)   Height: 5\' 10"  (1.778 m)    Body mass index is 20.09 kg/m. Physical Exam  Labs reviewed: Basic Metabolic Panel: Recent Labs    11/10/22 0336 11/11/22 0416 11/13/22 0600  NA 140 137 141  K 3.5 3.4* 3.8  CL 106 106 107  CO2 25 22 26   GLUCOSE 102* 114* 118*  BUN 23 25* 31*  CREATININE 0.99 1.16 0.96  CALCIUM 9.0 8.8* 9.0  MG 2.0 2.0 2.2  PHOS 3.3 3.6 3.6   Liver Function Tests: Recent Labs    03/06/22 1349 09/28/22 1756 11/09/22 0103  AST 14 14* 18  ALT 14 9 13   ALKPHOS 57 55 59  BILITOT 0.4 0.6 0.8  PROT 6.5 6.6 6.3*  ALBUMIN 4.2 3.9 3.9   No results for input(s): "LIPASE", "AMYLASE" in the last 8760 hours. No results for input(s): "AMMONIA" in the last 8760 hours. CBC: Recent Labs    11/25/21 2015 03/06/22 1349 11/09/22 0103 11/10/22 0336 11/11/22 0416 11/13/22 0600  WBC 9.4   < > 10.1 7.9 9.4 7.8  NEUTROABS 6.9  --  7.4  --   --   --   HGB 11.3*   < > 11.2* 12.0* 10.8* 10.5*  HCT 34.1*   < > 33.9* 35.1* 31.9* 31.1*  MCV 100.0   < > 101.5* 99.7 100.6* 100.3*  PLT 157   < > 155 170 173 163   < > = values in this interval not displayed.   Cardiac Enzymes: Recent Labs    11/09/22 0103   CKTOTAL 86   BNP: Invalid input(s): "POCBNP" Lab Results  Component Value Date   HGBA1C 5.7 01/27/2022   Lab Results  Component Value Date   TSH 2.78 03/06/2022   Lab Results  Component Value Date   VITAMINB12 641 03/06/2022   Lab Results  Component Value Date   FOLATE >24.2 03/06/2022   Lab Results  Component Value Date   IRON 119 06/01/2022   IRON 119 06/01/2022   TIBC 312.2 06/01/2022   FERRITIN 24.9 06/01/2022    Imaging and Procedures obtained prior to SNF admission: DG Pelvis Portable  Result Date: 11/09/2022 CLINICAL DATA:  Recent fall with pelvic pain, initial encounter EXAM: PORTABLE PELVIS 1 VIEWS COMPARISON:  12/14/2020 FINDINGS: Pelvic ring is intact. Mild retained fecal material is noted. No obstructive changes are seen. No acute fracture or dislocation is noted. Penile prosthesis is seen. IMPRESSION: No acute bony abnormality noted. Electronically Signed   By: Alcide Clever M.D.   On: 11/09/2022 01:38   DG Chest Port 1 View  Result Date: 11/09/2022 CLINICAL DATA:  Recent fall, initial encounter EXAM: PORTABLE CHEST 1 VIEW COMPARISON:  10/31/2022 FINDINGS: Cardiac shadow is stable. Postsurgical changes are again seen. Lungs are well aerated bilaterally. No focal infiltrate or effusion is noted. No bony abnormality is noted. IMPRESSION: No active disease. Electronically Signed   By: Alcide Clever M.D.   On: 11/09/2022 01:37   CT Cervical Spine Wo Contrast  Result Date: 11/09/2022 CLINICAL DATA:  Patient found down. EXAM: CT CERVICAL SPINE WITHOUT CONTRAST TECHNIQUE: Multidetector CT imaging of the cervical spine was performed without intravenous contrast. Multiplanar CT image reconstructions were also generated. RADIATION DOSE REDUCTION: This exam was performed according to the departmental dose-optimization program which includes automated exposure control, adjustment of the mA and/or kV according to patient size and/or use of iterative reconstruction technique.  COMPARISON:  October 31, 2022 FINDINGS: Alignment: There is approximately 1 mm anterolisthesis of the C7 vertebral body on T1. Skull base and vertebrae: No acute fracture. No primary bone lesion or focal pathologic process. Soft tissues and spinal canal: No prevertebral fluid or swelling. No visible canal hematoma. Disc levels: Mild endplate sclerosis, mild to moderate severity anterior osteophyte formation and moderate severity posterior bony spurring are seen at the levels of C2-C3, C3-C4, C4-C5, C5-C6 and C6-C7. Mild to moderate severity multilevel intervertebral disc space narrowing is seen throughout all levels of the cervical spine. Bilateral moderate severity multilevel facet joint hypertrophy is noted. Upper chest: Negative. Other: None. IMPRESSION: 1. No acute fracture within the cervical spine. 2. Moderate severity multilevel degenerative changes. 3. Approximately 1 mm anterolisthesis of the C7 vertebral body on T1. Electronically Signed  By: Aram Candela M.D.   On: 11/09/2022 01:33   CT Head Wo Contrast  Result Date: 11/09/2022 CLINICAL DATA:  Patient found down. EXAM: CT HEAD WITHOUT CONTRAST TECHNIQUE: Contiguous axial images were obtained from the base of the skull through the vertex without intravenous contrast. RADIATION DOSE REDUCTION: This exam was performed according to the departmental dose-optimization program which includes automated exposure control, adjustment of the mA and/or kV according to patient size and/or use of iterative reconstruction technique. COMPARISON:  October 31, 2022 FINDINGS: Brain: There is moderate severity cerebral atrophy with widening of the extra-axial spaces and ventricular dilatation. There are areas of decreased attenuation within the white matter tracts of the supratentorial brain, consistent with microvascular disease changes. Vascular: No hyperdense vessel or unexpected calcification. Skull: Normal. Negative for fracture or focal lesion. Sinuses/Orbits:  Chronic and postoperative changes are seen involving the medial walls of the bilateral maxillary sinuses. Other: None. IMPRESSION: 1. No acute intracranial abnormality. 2. Cerebral atrophy and microvascular disease changes of the supratentorial brain. Electronically Signed   By: Aram Candela M.D.   On: 11/09/2022 01:30    Assessment/Plan 1. Dementia due to Alzheimer's disease Patient with significant progression in the last year at a FAST 6E with urinary and fecal incontinence. Discussed concern that he is borderline in need of hospice. Patient has lost 30 lbs in the last couple of years. Appetite decrease. Visual hallucinations and delusions. Decline in vocabulary. Most recent albumin level within normal range. Currently connected with palliative care. Pt is extremely sleepy at this time likley due to two new medications -- sinemet and seroquel. Patient with hx of parkinsonism, however, no diagnosis with neurology. Discussed with spouse concern regarding the level of sedation patient is experiencing and plan to discontinue both at this time. Trazodone 12.5 mg daily PRN for agitation or sundowning. If no improvement with medication changes, will likely consult hospice.   2. Stage 3 chronic kidney disease, unspecified whether stage 3a or 3b CKD Stable. Avoid nephrotoxic medications eGFR  Date/Time Value Ref Range Status  11/24/2021 10:45 AM 54 (L) >59 mL/min/1.73 Final  ]  3. Coronary artery disease of bypass graft of native heart with stable angina pectoris 4. Sinus bradycardia with RBBB Significant cardiac history. Patient is DNR status. No  5. Hypertensive urgency BP elevated during hospitalization thought to contribute to confusion. Addition of imdur 60 added to losartan and amlodipine. Continue q shift vitals, deescalate regimen as indicated.   6. Malignant neoplasm of left kidney, except renal pelvis  Urinary retention Hx of partial nephrectomy. Foley Catheter in place. Plan for TOV  on 4/22. Continue bowel regimen.   7. Thrombocytopenia Noted, repeat CBC.     Family/ staff Communication: spouse, nursing  Labs/tests ordered: CBC and BMP in 1 week.   I spent 60 minutes for the care of this patient in chart review, face to face time, and clinical documentation. Also goals of care conversation.

## 2022-11-16 ENCOUNTER — Encounter (INDEPENDENT_AMBULATORY_CARE_PROVIDER_SITE_OTHER): Payer: Medicare Other | Admitting: Ophthalmology

## 2022-11-20 ENCOUNTER — Other Ambulatory Visit: Payer: Medicare Other

## 2022-11-22 ENCOUNTER — Non-Acute Institutional Stay (SKILLED_NURSING_FACILITY): Payer: Medicare Other | Admitting: Student

## 2022-11-22 ENCOUNTER — Encounter: Payer: Self-pay | Admitting: Student

## 2022-11-22 DIAGNOSIS — R410 Disorientation, unspecified: Secondary | ICD-10-CM

## 2022-11-22 DIAGNOSIS — F02C11 Dementia in other diseases classified elsewhere, severe, with agitation: Secondary | ICD-10-CM | POA: Diagnosis not present

## 2022-11-22 DIAGNOSIS — G301 Alzheimer's disease with late onset: Secondary | ICD-10-CM | POA: Diagnosis not present

## 2022-11-22 DIAGNOSIS — J302 Other seasonal allergic rhinitis: Secondary | ICD-10-CM | POA: Diagnosis not present

## 2022-11-22 NOTE — Progress Notes (Unsigned)
Location:  Other [facility].  Nursing Home Room Number: [facility] 103A Place of Service:  SNF [phone]) Provider:  Dr. Earnestine Mealing  PCP: Elenore Paddy, NP  Patient Care Team: Elenore Paddy, NP as PCP - General (Nurse Practitioner) [place], Peter M, MD as PCP - Cardiology (Cardiology)  Extended Emergency Contact Information Primary Emergency Contact: [name],[name] Address: [address]          [place], [place] [address] [place] of [place] Home Phone: [phone] Mobile Phone: [phone] Relation: Spouse  Code Status:  DNR Goals of care: Advanced Directive information    11/22/2022    2:26 PM  Advanced Directives  Does Patient Have a Medical Advance Directive? Yes  Type of Estate agent of [place];Living will;Out of facility DNR (pink MOST or yellow form)  Does patient want to make changes to medical advance directive? No - Patient declined  Copy of Healthcare Power of Attorney in Chart? Yes - validated most recent copy scanned in chart (See row information)     Chief Complaint  Patient presents with   Acute Visit    Daughter requesting patient to be seen.     HPI:  Pt is a 88 y.o. male seen today for an acute visit for increased sleepiness. Over the weekend patient had an episode with tremors. Which subsided with seroquel. He has had moments where he has been more interactive and told his wife he loves her which he often does. He has not been able to participate in physical therapy consistently due to sleepiness.   Past Medical History:  Diagnosis Date   Arthritis    Balance problem    Barrett esophagus    Bradycardia    Coronary artery disease    GERD (gastroesophageal reflux disease)    Gout    History of atrial fibrillation    Previously on amiodarone   Hx of CABG    Hypercholesterolemia    Hypertension    Left renal mass    Melanoma    Left shoulder blade   Nocturia    Right epiretinal membrane 02/10/2020    Past Surgical History:  Procedure Laterality Date   CARDIAC CATHETERIZATION  01/04/2010   COLONOSCOPY     CORONARY ARTERY BYPASS GRAFT  02/07/2010   LIMA to LAD, SVG to 2nd DX, SVG to OM   EYE SURGERY Bilateral    Cataract Extraction   HEMORRHOID SURGERY     x 2   HEMORRHOID SURGERY N/A 11/13/2012   Procedure: PPH Hemorrhoidectomy;  Surgeon: Robyne Askew, MD;  Location: [facility] OR;  Service: General;  Laterality: N/A;   INGUINAL HERNIA REPAIR Right 01/22/2017   Procedure: RIGHT INGUINAL HERNIA REPAIR WITH MESH;  Surgeon: Darnell Level, MD;  Location: [facility] OR;  Service: General;  Laterality: Right;   INSERTION OF MESH Right 01/22/2017   Procedure: INSERTION OF MESH;  Surgeon: Darnell Level, MD;  Location: [facility] OR;  Service: General;  Laterality: Right;   IR RADIOLOGIST EVAL & MGMT  01/06/2021   NASAL POLYP EXCISION     OTHER SURGICAL HISTORY  06/05/2002   Penile Implant   ROBOTIC ASSITED PARTIAL NEPHRECTOMY Left 02/25/2021   Procedure: XI ROBOTIC ASSITED PARTIAL NEPHRECTOMY;  Surgeon: Rene Paci, MD;  Location: [facility] ORS;  Service: Urology;  Laterality: Left;   UPPER GI ENDOSCOPY      Allergies  Allergen Reactions   Lexapro [Escitalopram]     Confusion, weakness   Namenda [Memantine]  Insomnia   Lisinopril Other (See Comments)    Cough    Outpatient Encounter Medications as of 11/22/2022  Medication Sig   amLODipine (NORVASC) 10 MG tablet TAKE 1 TABLET BY MOUTH EVERY DAY   ammonium lactate (AMLACTIN) 12 % cream Apply 1 g topically as needed for dry skin.   atorvastatin (LIPITOR) 10 MG tablet Take 0.5 tablets (5 mg total) by mouth daily at 6 PM.   cetirizine (ZYRTEC) 5 MG tablet Take 5 mg by mouth daily.   docusate sodium (COLACE) 100 MG capsule Take 1 capsule (100 mg total) by mouth 2 (two) times daily.   feeding supplement (ENSURE ENLIVE / ENSURE PLUS) LIQD Take 237 mLs by mouth 3 (three) times daily between meals.   isosorbide mononitrate (IMDUR) 60 MG 24 hr  tablet Take 1 tablet (60 mg total) by mouth every evening. Skip the dose if systolic BP less than 140 mmHg   lidocaine (LIDODERM) 5 % Place 1 patch onto the skin daily. Remove & Discard patch within 12 hours or as directed by MD   losartan (COZAAR) 50 MG tablet TAKE 1 TABLET BY MOUTH EVERY DAY   melatonin 5 MG TABS Take 1 tablet (5 mg total) by mouth every evening.   Multiple Vitamin (MULTIVITAMIN) tablet Take 1 tablet by mouth daily.   mupirocin ointment (BACTROBAN) 2 % Apply 1 application  topically 2 (two) times daily.   nitroGLYCERIN (NITROSTAT) 0.4 MG SL tablet *PLACE 1 TABLET UNDER TONGUE EVERY 5 MINS., UP TO 3 DOSES AS NEEDED FOR CHEST PAIN*   omeprazole (PRILOSEC) 20 MG capsule Take 20 mg by mouth 2 (two) times daily.   QUEtiapine (SEROQUEL) 25 MG tablet Take 12.5 mg by mouth at bedtime.   tamsulosin (FLOMAX) 0.4 MG CAPS capsule Take 1 capsule (0.4 mg total) by mouth daily after supper.   trimethoprim-polymyxin b (POLYTRIM) ophthalmic solution Place 1 drop into both eyes 2 (two) times daily.   Zinc Oxide (TRIPLE PASTE) 12.8 % ointment Apply 1 Application topically as needed for irritation.   [DISCONTINUED] promethazine (PHENERGAN) 12.5 MG tablet Take 1 tablet (12.5 mg total) by mouth every 4 (four) hours as needed for nausea or vomiting.   No facility-administered encounter medications on file as of 11/22/2022.    Review of Systems  Immunization History  Administered Date(s) Administered   PFIZER(Purple Top)SARS-COV-2 Vaccination 08/25/2019, 09/15/2019, 05/18/2020   Tdap 11/25/2021   Pertinent  Health Maintenance Due  Topic Date Due   INFLUENZA VACCINE  03/01/2023      06/01/2022    2:42 PM 06/08/2022    2:51 PM 08/31/2022    1:07 PM 09/12/2022    3:31 PM 10/13/2022   12:38 PM  Fall Risk  Falls in the past year? 1 1 0 1 1  Was there an injury with Fall? 1 0 0 0 0  Fall Risk Category Calculator 3 2 0 2 2  Fall Risk Category (Retired) High Moderate     (RETIRED) Patient Fall  Risk Level High fall risk Moderate fall risk     Patient at Risk for Falls Due to History of fall(s)  History of fall(s)    Patient at Risk for Falls Due to - Comments   cane    Fall risk Follow up Falls evaluation completed Falls evaluation completed Falls prevention discussed Falls evaluation completed Falls evaluation completed   Functional Status Survey:    Vitals:   11/22/22 1416 11/22/22 1427  BP: (!) 145/68 115/64  Pulse: 76  Resp: 18   Temp: 97.6 F (36.4 C)   SpO2: 94%   Weight: 152 lb 6.4 oz (69.1 kg)   Height: 5\' 10"  (1.778 m)    Body mass index is 21.87 kg/m. Physical Exam Vitals reviewed.  Constitutional:      Comments: Sleeping in recliner chair, arousable, not interactive  Cardiovascular:     Rate and Rhythm: Normal rate.     Pulses: Normal pulses.     Heart sounds: Normal heart sounds.  Pulmonary:     Effort: Pulmonary effort is normal.     Breath sounds: Normal breath sounds.  Neurological:     Mental Status: He is disoriented.     Labs reviewed: Recent Labs    11/10/22 0336 11/11/22 0416 11/13/22 0600  NA 140 137 141  K 3.5 3.4* 3.8  CL 106 106 107  CO2 25 22 26   GLUCOSE 102* 114* 118*  BUN 23 25* 31*  CREATININE 0.99 1.16 0.96  CALCIUM 9.0 8.8* 9.0  MG 2.0 2.0 2.2  PHOS 3.3 3.6 3.6   Recent Labs    03/06/22 1349 09/28/22 1756 11/09/22 0103  AST 14 14* 18  ALT 14 9 13   ALKPHOS 57 55 59  BILITOT 0.4 0.6 0.8  PROT 6.5 6.6 6.3*  ALBUMIN 4.2 3.9 3.9   Recent Labs    11/25/21 2015 03/06/22 1349 11/09/22 0103 11/10/22 0336 11/11/22 0416 11/13/22 0600  WBC 9.4   < > 10.1 7.9 9.4 7.8  NEUTROABS 6.9  --  7.4  --   --   --   HGB 11.3*   < > 11.2* 12.0* 10.8* 10.5*  HCT 34.1*   < > 33.9* 35.1* 31.9* 31.1*  MCV 100.0   < > 101.5* 99.7 100.6* 100.3*  PLT 157   < > 155 170 173 163   < > = values in this interval not displayed.   Lab Results  Component Value Date   TSH 2.78 03/06/2022   Lab Results  Component Value Date    HGBA1C 5.7 01/27/2022   Lab Results  Component Value Date   CHOL 112 11/24/2021   HDL 37 (L) 11/24/2021   LDLCALC 53 11/24/2021   TRIG 122 11/24/2021   CHOLHDL 3.0 11/24/2021    Significant Diagnostic Results in last 30 days:  DG Pelvis Portable  Result Date: 11/09/2022 CLINICAL DATA:  Recent fall with pelvic pain, initial encounter EXAM: PORTABLE PELVIS 1 VIEWS COMPARISON:  12/14/2020 FINDINGS: Pelvic ring is intact. Mild retained fecal material is noted. No obstructive changes are seen. No acute fracture or dislocation is noted. Penile prosthesis is seen. IMPRESSION: No acute bony abnormality noted. Electronically Signed   By: Alcide Clever M.D.   On: 11/09/2022 01:38   DG Chest Port 1 View  Result Date: 11/09/2022 CLINICAL DATA:  Recent fall, initial encounter EXAM: PORTABLE CHEST 1 VIEW COMPARISON:  10/31/2022 FINDINGS: Cardiac shadow is stable. Postsurgical changes are again seen. Lungs are well aerated bilaterally. No focal infiltrate or effusion is noted. No bony abnormality is noted. IMPRESSION: No active disease. Electronically Signed   By: Alcide Clever M.D.   On: 11/09/2022 01:37   CT Cervical Spine Wo Contrast  Result Date: 11/09/2022 CLINICAL DATA:  Patient found down. EXAM: CT CERVICAL SPINE WITHOUT CONTRAST TECHNIQUE: Multidetector CT imaging of the cervical spine was performed without intravenous contrast. Multiplanar CT image reconstructions were also generated. RADIATION DOSE REDUCTION: This exam was performed according to the departmental dose-optimization program which includes automated  exposure control, adjustment of the mA and/or kV according to patient size and/or use of iterative reconstruction technique. COMPARISON:  October 31, 2022 FINDINGS: Alignment: There is approximately 1 mm anterolisthesis of the C7 vertebral body on T1. Skull base and vertebrae: No acute fracture. No primary bone lesion or focal pathologic process. Soft tissues and spinal canal: No prevertebral  fluid or swelling. No visible canal hematoma. Disc levels: Mild endplate sclerosis, mild to moderate severity anterior osteophyte formation and moderate severity posterior bony spurring are seen at the levels of C2-C3, C3-C4, C4-C5, C5-C6 and C6-C7. Mild to moderate severity multilevel intervertebral disc space narrowing is seen throughout all levels of the cervical spine. Bilateral moderate severity multilevel facet joint hypertrophy is noted. Upper chest: Negative. Other: None. IMPRESSION: 1. No acute fracture within the cervical spine. 2. Moderate severity multilevel degenerative changes. 3. Approximately 1 mm anterolisthesis of the C7 vertebral body on T1. Electronically Signed   By: Aram Candela M.D.   On: 11/09/2022 01:33   CT Head Wo Contrast  Result Date: 11/09/2022 CLINICAL DATA:  Patient found down. EXAM: CT HEAD WITHOUT CONTRAST TECHNIQUE: Contiguous axial images were obtained from the base of the skull through the vertex without intravenous contrast. RADIATION DOSE REDUCTION: This exam was performed according to the departmental dose-optimization program which includes automated exposure control, adjustment of the mA and/or kV according to patient size and/or use of iterative reconstruction technique. COMPARISON:  October 31, 2022 FINDINGS: Brain: There is moderate severity cerebral atrophy with widening of the extra-axial spaces and ventricular dilatation. There are areas of decreased attenuation within the white matter tracts of the supratentorial brain, consistent with microvascular disease changes. Vascular: No hyperdense vessel or unexpected calcification. Skull: Normal. Negative for fracture or focal lesion. Sinuses/Orbits: Chronic and postoperative changes are seen involving the medial walls of the bilateral maxillary sinuses. Other: None. IMPRESSION: 1. No acute intracranial abnormality. 2. Cerebral atrophy and microvascular disease changes of the supratentorial brain. Electronically Signed    By: Aram Candela M.D.   On: 11/09/2022 01:30   CT CHEST WO CONTRAST  Result Date: 10/31/2022 CLINICAL DATA:  Chest trauma, blunt.  Left back pain EXAM: CT CHEST WITHOUT CONTRAST TECHNIQUE: Multidetector CT imaging of the chest was performed following the standard protocol without IV contrast. RADIATION DOSE REDUCTION: This exam was performed according to the departmental dose-optimization program which includes automated exposure control, adjustment of the mA and/or kV according to patient size and/or use of iterative reconstruction technique. COMPARISON:  CT abdomen pelvis 11/19/2020 FINDINGS: Cardiovascular: Coronary artery bypass grafting has been performed. Global cardiac size within normal limits. No pericardial effusion. Central pulmonary arteries are enlarged in keeping with changes of pulmonary arterial hypertension. Moderate atherosclerotic calcification within the thoracic aorta. Mild dilation of the ascending thoracic aorta which measures 4.1 cm in greatest dimension. Descending thoracic aorta is of normal caliber. Aberrant right subclavian artery. Mediastinum/Nodes: No enlarged mediastinal or axillary lymph nodes. Thyroid gland, trachea, and esophagus demonstrate no significant findings. Lungs/Pleura: 1.5 x 2.8 cm pulmonary mass has developed within the right posterior basal lower lobe within the costophrenic sulcus. Lungs are otherwise clear. No pneumothorax or pleural effusion. Upper Abdomen: No acute abnormality. Musculoskeletal: Osseous structures are age-appropriate. No acute bone abnormality. No lytic or blastic bone lesion. IMPRESSION: 1. No acute intrathoracic pathology identified. 2. Solid pulmonary nodule measuring 28 mm. Per Fleischner Society Guidelines, recommend a PET/CT or tissue sampling. These guidelines do not apply to immunocompromised patients and patients with cancer. Follow up in  patients with significant comorbidities as clinically warranted. For lung cancer screening,  adhere to Lung-RADS guidelines. Reference: Radiology. 2017; 284(1):228-43. 3. Morphologic changes in keeping with pulmonary arterial hypertension. 4. 4.1 cm ascending thoracic aortic aneurysm. Recommend annual imaging followup by CTA or MRA. This recommendation follows 2010 ACCF/AHA/AATS/ACR/ASA/SCA/SCAI/SIR/STS/SVM Guidelines for the Diagnosis and Management of Patients with Thoracic Aortic Disease. Circulation. 2010; 121: Z610-R604. Aortic aneurysm NOS (ICD10-I71.9) Aortic Atherosclerosis (ICD10-I70.0). Aortic aneurysm NOS (ICD10-I71.9). Electronically Signed   By: Helyn Numbers M.D.   On: 10/31/2022 01:36   CT Head Wo Contrast  Result Date: 10/31/2022 CLINICAL DATA:  Head trauma, intracranial venous injury suspected EXAM: CT HEAD WITHOUT CONTRAST CT CERVICAL SPINE WITHOUT CONTRAST TECHNIQUE: Multidetector CT imaging of the head and cervical spine was performed following the standard protocol without intravenous contrast. Multiplanar CT image reconstructions of the cervical spine were also generated. RADIATION DOSE REDUCTION: This exam was performed according to the departmental dose-optimization program which includes automated exposure control, adjustment of the mA and/or kV according to patient size and/or use of iterative reconstruction technique. COMPARISON:  None Available. FINDINGS: CT HEAD FINDINGS Brain: Normal anatomic configuration. Parenchymal volume loss is commensurate with the patient's age. Stable moderate periventricular white matter changes are present likely reflecting the sequela of small vessel ischemia. No abnormal intra or extra-axial mass lesion or fluid collection. No abnormal mass effect or midline shift. No evidence of acute intracranial hemorrhage or infarct. Ventricular size is commensurate with the degree of parenchymal volume loss. Cerebellum unremarkable. Vascular: No asymmetric hyperdense vasculature at the skull base. Skull: Intact Sinuses/Orbits: Extensive paranasal sinus  surgery has been performed with bilateral ethmoidectomy, sphenoid ectomy, middle turbinectomy, and maxillary antrectomy. Orbits are unremarkable. Other: Mastoid air cells and middle ear cavities are clear. CT CERVICAL SPINE FINDINGS Alignment: Normal. Skull base and vertebrae: No acute fracture. No primary bone lesion or focal pathologic process. Soft tissues and spinal canal: No prevertebral fluid or swelling. No visible canal hematoma. Extensive atherosclerotic calcification within the carotid bifurcations bilaterally. Disc levels: Intervertebral disc space narrowing and endplate remodeling throughout the cervical spine is in keeping with changes of diffuse moderate to severe degenerative disc disease. Prevertebral soft tissues are not thickened on sagittal reformats. Posterior disc herniations at C3-4, C4-5, C5-6, and C6-7 result in mild central canal stenosis, most severe at C4-5. Multilevel uncovertebral and facet arthrosis results in multilevel moderate neuroforaminal narrowing, most severe bilaterally at C3-C6 Upper chest: Negative. Other: None IMPRESSION: 1. No acute intracranial abnormality. No calvarial fracture. 2. Stable senescent change. 3. No acute fracture or listhesis of the cervical spine. 4. Advanced multilevel degenerative disc and degenerative joint disease resulting in multilevel mild central canal stenosis and moderate bilateral neuroforaminal narrowing, most severe at C3-C6. 5. Extensive atherosclerotic calcification within the carotid bifurcations bilaterally. Electronically Signed   By: Helyn Numbers M.D.   On: 10/31/2022 01:20   CT Cervical Spine Wo Contrast  Result Date: 10/31/2022 CLINICAL DATA:  Head trauma, intracranial venous injury suspected EXAM: CT HEAD WITHOUT CONTRAST CT CERVICAL SPINE WITHOUT CONTRAST TECHNIQUE: Multidetector CT imaging of the head and cervical spine was performed following the standard protocol without intravenous contrast. Multiplanar CT image  reconstructions of the cervical spine were also generated. RADIATION DOSE REDUCTION: This exam was performed according to the departmental dose-optimization program which includes automated exposure control, adjustment of the mA and/or kV according to patient size and/or use of iterative reconstruction technique. COMPARISON:  None Available. FINDINGS: CT HEAD FINDINGS Brain: Normal anatomic configuration. Parenchymal volume loss  is commensurate with the patient's age. Stable moderate periventricular white matter changes are present likely reflecting the sequela of small vessel ischemia. No abnormal intra or extra-axial mass lesion or fluid collection. No abnormal mass effect or midline shift. No evidence of acute intracranial hemorrhage or infarct. Ventricular size is commensurate with the degree of parenchymal volume loss. Cerebellum unremarkable. Vascular: No asymmetric hyperdense vasculature at the skull base. Skull: Intact Sinuses/Orbits: Extensive paranasal sinus surgery has been performed with bilateral ethmoidectomy, sphenoid ectomy, middle turbinectomy, and maxillary antrectomy. Orbits are unremarkable. Other: Mastoid air cells and middle ear cavities are clear. CT CERVICAL SPINE FINDINGS Alignment: Normal. Skull base and vertebrae: No acute fracture. No primary bone lesion or focal pathologic process. Soft tissues and spinal canal: No prevertebral fluid or swelling. No visible canal hematoma. Extensive atherosclerotic calcification within the carotid bifurcations bilaterally. Disc levels: Intervertebral disc space narrowing and endplate remodeling throughout the cervical spine is in keeping with changes of diffuse moderate to severe degenerative disc disease. Prevertebral soft tissues are not thickened on sagittal reformats. Posterior disc herniations at C3-4, C4-5, C5-6, and C6-7 result in mild central canal stenosis, most severe at C4-5. Multilevel uncovertebral and facet arthrosis results in multilevel  moderate neuroforaminal narrowing, most severe bilaterally at C3-C6 Upper chest: Negative. Other: None IMPRESSION: 1. No acute intracranial abnormality. No calvarial fracture. 2. Stable senescent change. 3. No acute fracture or listhesis of the cervical spine. 4. Advanced multilevel degenerative disc and degenerative joint disease resulting in multilevel mild central canal stenosis and moderate bilateral neuroforaminal narrowing, most severe at C3-C6. 5. Extensive atherosclerotic calcification within the carotid bifurcations bilaterally. Electronically Signed   By: Helyn Numbers M.D.   On: 10/31/2022 01:20   DG Chest Port 1 View  Result Date: 10/31/2022 CLINICAL DATA:  Dizziness. EXAM: PORTABLE CHEST 1 VIEW COMPARISON:  Chest radiograph dated 09/28/2022. FINDINGS: Faint lucency along the right lateral pleural surface may be artifactual and related to skin fold. A pneumothorax is less likely but not excluded. Clinical correlation and repeat radiograph after repositioning of the patient recommended. No focal consolidation, or pleural effusion. Top-normal cardiac size. Median sternotomy wires and CABG vascular clips. No acute osseous pathology. IMPRESSION: Skin fold artifact versus less likely a right-sided pneumothorax. Electronically Signed   By: Elgie Collard M.D.   On: 10/31/2022 00:45    Assessment/Plan Delirium  Severe late onset Alzheimer's dementia with agitation  Seasonal allergies Patient continues to wax and wane with mental status. Hypoactive delirium at this time, will discontinue scheduled trazodone and continue PRN Seroquel for symptoms. Wife continues to have questions regarding diagnosis of PD vs AD, discussed some dementia's develop movement disorders as the symptoms progress. Patient was showering and shaving independently prior to hospitalization, so should progress with therapy as his delirium improves. Continue efforts of reorientation and redirection. Avoid medication as possible.  Patient had some nasal drainage earlier, will start 5 mg zyrtec daily.    Family/ staff Communication: spouse, nursing  Labs/tests ordered:  CBC, CMP

## 2022-11-23 LAB — BASIC METABOLIC PANEL
BUN: 62 — AB (ref 4–21)
CO2: 28 — AB (ref 13–22)
Chloride: 110 — AB (ref 99–108)
Creatinine: 1.3 (ref 0.6–1.3)
Glucose: 99
Potassium: 4 mEq/L (ref 3.5–5.1)
Sodium: 148 — AB (ref 137–147)

## 2022-11-23 LAB — COMPREHENSIVE METABOLIC PANEL
Albumin: 3.4 — AB (ref 3.5–5.0)
Calcium: 9.8 (ref 8.7–10.7)
Globulin: 2.8

## 2022-11-23 LAB — HEPATIC FUNCTION PANEL
ALT: 175 U/L — AB (ref 10–40)
AST: 83 — AB (ref 14–40)
Alkaline Phosphatase: 95 (ref 25–125)
Bilirubin, Total: 0.4

## 2022-11-23 LAB — CBC AND DIFFERENTIAL
HCT: 38 — AB (ref 41–53)
Hemoglobin: 12.4 — AB (ref 13.5–17.5)
Neutrophils Absolute: 14483
Platelets: 261 10*3/uL (ref 150–400)
WBC: 20.2

## 2022-11-23 LAB — CBC: RBC: 3.73 — AB (ref 3.87–5.11)

## 2022-11-24 ENCOUNTER — Non-Acute Institutional Stay (SKILLED_NURSING_FACILITY): Payer: Medicare Other | Admitting: Student

## 2022-11-24 ENCOUNTER — Encounter: Payer: Self-pay | Admitting: Student

## 2022-11-24 DIAGNOSIS — Z66 Do not resuscitate: Secondary | ICD-10-CM

## 2022-11-24 DIAGNOSIS — E43 Unspecified severe protein-calorie malnutrition: Secondary | ICD-10-CM

## 2022-11-24 DIAGNOSIS — G301 Alzheimer's disease with late onset: Secondary | ICD-10-CM | POA: Diagnosis not present

## 2022-11-24 DIAGNOSIS — R5383 Other fatigue: Secondary | ICD-10-CM

## 2022-11-24 DIAGNOSIS — D72829 Elevated white blood cell count, unspecified: Secondary | ICD-10-CM | POA: Diagnosis not present

## 2022-11-24 DIAGNOSIS — F02C2 Dementia in other diseases classified elsewhere, severe, with psychotic disturbance: Secondary | ICD-10-CM

## 2022-11-24 NOTE — Progress Notes (Unsigned)
[facility] Room Number: 103 A Place of Service:  SNF [phone]) Provider:  Coralyn Helling, MD  Patient Care Team: Elenore Paddy, NP as PCP - General (Nurse Practitioner) [place], Peter M, MD as PCP - Cardiology (Cardiology)  Extended Emergency Contact Information Primary Emergency Contact: [name],[name] Address: [address]          [place], [place] [address] [place] of [place] Home Phone: [phone] Mobile Phone: [phone] Relation: Spouse  Code Status:  DNR Goals of care: Advanced Directive information    11/24/2022   10:49 AM  Advanced Directives  Does Patient Have a Medical Advance Directive? Yes  Type of Estate agent of [place];Living will;Out of facility DNR (pink MOST or yellow form)  Does patient want to make changes to medical advance directive? No - Patient declined  Copy of Healthcare Power of Attorney in Chart? Yes - validated most recent copy scanned in chart (See row information)  Pre-existing out of facility DNR order (yellow form or pink MOST form) Yellow form placed in chart (order not valid for inpatient use)     Chief Complaint  Patient presents with   Acute Visit    Mental status concerns     HPI:  Pt is a 88 y.o. male seen today for an acute visit for lethargy. Patient has had continued sleepiness despite discontinuing scheduled trazodone. Discussed most recent lab results with nursing staff. No concerning behaviors at this time. Patient awakes and speaks a few nonsensical words.   Patient chokes with taking drinks of water.  Past Medical History:  Diagnosis Date   Arthritis    Balance problem    Barrett esophagus    Bradycardia    Coronary artery disease    GERD (gastroesophageal reflux disease)    Gout    History of atrial fibrillation    Previously on amiodarone   Hx of CABG    Hypercholesterolemia    Hypertension    Left renal mass    Melanoma (HCC)    Left  shoulder blade   Nocturia    Right epiretinal membrane 02/10/2020   Past Surgical History:  Procedure Laterality Date   CARDIAC CATHETERIZATION  01/04/2010   COLONOSCOPY     CORONARY ARTERY BYPASS GRAFT  02/07/2010   LIMA to LAD, SVG to 2nd DX, SVG to OM   EYE SURGERY Bilateral    Cataract Extraction   HEMORRHOID SURGERY     x 2   HEMORRHOID SURGERY N/A 11/13/2012   Procedure: PPH Hemorrhoidectomy;  Surgeon: Robyne Askew, MD;  Location: [facility] OR;  Service: General;  Laterality: N/A;   INGUINAL HERNIA REPAIR Right 01/22/2017   Procedure: RIGHT INGUINAL HERNIA REPAIR WITH MESH;  Surgeon: Darnell Level, MD;  Location: [facility] OR;  Service: General;  Laterality: Right;   INSERTION OF MESH Right 01/22/2017   Procedure: INSERTION OF MESH;  Surgeon: Darnell Level, MD;  Location: [facility] OR;  Service: General;  Laterality: Right;   IR RADIOLOGIST EVAL & MGMT  01/06/2021   NASAL POLYP EXCISION     OTHER SURGICAL HISTORY  06/05/2002   Penile Implant   ROBOTIC ASSITED PARTIAL NEPHRECTOMY Left 02/25/2021   Procedure: XI ROBOTIC ASSITED PARTIAL NEPHRECTOMY;  Surgeon: Rene Paci, MD;  Location: [facility] ORS;  Service: Urology;  Laterality: Left;   UPPER GI ENDOSCOPY      Allergies  Allergen Reactions   Lexapro [Escitalopram]     Confusion, weakness  Namenda [Memantine]     Insomnia   Lisinopril Other (See Comments)    Cough    Outpatient Encounter Medications as of 11/24/2022  Medication Sig   amLODipine (NORVASC) 10 MG tablet TAKE 1 TABLET BY MOUTH EVERY DAY   ammonium lactate (AMLACTIN) 12 % cream Apply 1 g topically as needed for dry skin.   atorvastatin (LIPITOR) 10 MG tablet Take 0.5 tablets (5 mg total) by mouth daily at 6 PM.   bisacodyl (DULCOLAX) 10 MG suppository Place 10 mg rectally as directed. Every 24 hours as needed for constipation   cefTRIAXone (ROCEPHIN) 1 g injection Inject 1 g into the muscle daily. X 5 days for infection   cetirizine (ZYRTEC) 5 MG tablet Take 5 mg by  mouth daily.   docusate sodium (COLACE) 100 MG capsule Take 1 capsule (100 mg total) by mouth 2 (two) times daily.   feeding supplement (ENSURE ENLIVE / ENSURE PLUS) LIQD Take 237 mLs by mouth 3 (three) times daily between meals.   isosorbide mononitrate (IMDUR) 60 MG 24 hr tablet Take 1 tablet (60 mg total) by mouth every evening. Skip the dose if systolic BP less than 140 mmHg   lidocaine (LIDODERM) 5 % Place 1 patch onto the skin daily. Remove & Discard patch within 12 hours or as directed by MD   losartan (COZAAR) 50 MG tablet TAKE 1 TABLET BY MOUTH EVERY DAY   melatonin 5 MG TABS Take 1 tablet (5 mg total) by mouth every evening.   Multiple Vitamin (MULTIVITAMIN) tablet Take 1 tablet by mouth daily.   mupirocin ointment (BACTROBAN) 2 % Apply 1 application  topically 2 (two) times daily.   nitroGLYCERIN (NITROSTAT) 0.4 MG SL tablet *PLACE 1 TABLET UNDER TONGUE EVERY 5 MINS., UP TO 3 DOSES AS NEEDED FOR CHEST PAIN*   omeprazole (PRILOSEC) 20 MG capsule Take 20 mg by mouth 2 (two) times daily.   QUEtiapine (SEROQUEL) 25 MG tablet Take 12.5 mg by mouth at bedtime.   tamsulosin (FLOMAX) 0.4 MG CAPS capsule Take 1 capsule (0.4 mg total) by mouth daily after supper.   trimethoprim-polymyxin b (POLYTRIM) ophthalmic solution Place 1 drop into both eyes 2 (two) times daily.   Zinc Oxide (TRIPLE PASTE) 12.8 % ointment Apply 1 Application topically as needed for irritation.   No facility-administered encounter medications on file as of 11/24/2022.    Review of Systems  Immunization History  Administered Date(s) Administered   PFIZER(Purple Top)SARS-COV-2 Vaccination 08/25/2019, 09/15/2019, 05/18/2020   Tdap 11/25/2021   Pertinent  Health Maintenance Due  Topic Date Due   INFLUENZA VACCINE  03/01/2023      06/01/2022    2:42 PM 06/08/2022    2:51 PM 08/31/2022    1:07 PM 09/12/2022    3:31 PM 10/13/2022   12:38 PM  Fall Risk  Falls in the past year? 1 1 0 1 1  Was there an injury with Fall? 1  0 0 0 0  Fall Risk Category Calculator 3 2 0 2 2  Fall Risk Category (Retired) High Moderate     (RETIRED) Patient Fall Risk Level High fall risk Moderate fall risk     Patient at Risk for Falls Due to History of fall(s)  History of fall(s)    Patient at Risk for Falls Due to - Comments   cane    Fall risk Follow up Falls evaluation completed Falls evaluation completed Falls prevention discussed Falls evaluation completed Falls evaluation completed   Functional Status Survey:  Vitals:   11/24/22 1048  BP: 125/72  Pulse: 74  Temp: (!) 97.1 F (36.2 C)  Weight: 152 lb 6.4 oz (69.1 kg)  Height: 5\' 10"  (1.778 m)   Body mass index is 21.87 kg/m. Physical Exam Constitutional:      Comments: Patient is chronically ill appearing, sleeping in reclining geri-chair  Cardiovascular:     Rate and Rhythm: Normal rate.     Pulses: Normal pulses.  Pulmonary:     Effort: Pulmonary effort is normal.     Breath sounds: Rhonchi present.  Abdominal:     General: Abdomen is flat.     Palpations: Abdomen is soft.  Skin:    General: Skin is warm and dry.  Neurological:     Mental Status: He is disoriented.     Labs reviewed: Recent Labs    11/10/22 0336 11/11/22 0416 11/13/22 0600 11/23/22 0000  NA 140 137 141 148*  K 3.5 3.4* 3.8 4.0  CL 106 106 107 110*  CO2 25 22 26  28*  GLUCOSE 102* 114* 118*  --   BUN 23 25* 31* 62*  CREATININE 0.99 1.16 0.96 1.3  CALCIUM 9.0 8.8* 9.0 9.8  MG 2.0 2.0 2.2  --   PHOS 3.3 3.6 3.6  --    Recent Labs    03/06/22 1349 09/28/22 1756 11/09/22 0103 11/23/22 0000  AST 14 14* 18 83*  ALT 14 9 13  175*  ALKPHOS 57 55 59 95  BILITOT 0.4 0.6 0.8  --   PROT 6.5 6.6 6.3*  --   ALBUMIN 4.2 3.9 3.9 3.4*   Recent Labs    11/25/21 2015 03/06/22 1349 11/09/22 0103 11/10/22 0336 11/11/22 0416 11/13/22 0600 11/23/22 0000  WBC 9.4   < > 10.1 7.9 9.4 7.8 20.2  NEUTROABS 6.9  --  7.4  --   --   --  14,483.00  HGB 11.3*   < > 11.2* 12.0* 10.8*  10.5* 12.4*  HCT 34.1*   < > 33.9* 35.1* 31.9* 31.1* 38*  MCV 100.0   < > 101.5* 99.7 100.6* 100.3*  --   PLT 157   < > 155 170 173 163 261   < > = values in this interval not displayed.   Lab Results  Component Value Date   TSH 2.78 03/06/2022   Lab Results  Component Value Date   HGBA1C 5.7 01/27/2022   Lab Results  Component Value Date   CHOL 112 11/24/2021   HDL 37 (L) 11/24/2021   LDLCALC 53 11/24/2021   TRIG 122 11/24/2021   CHOLHDL 3.0 11/24/2021    Significant Diagnostic Results in last 30 days:  DG Pelvis Portable  Result Date: 11/09/2022 CLINICAL DATA:  Recent fall with pelvic pain, initial encounter EXAM: PORTABLE PELVIS 1 VIEWS COMPARISON:  12/14/2020 FINDINGS: Pelvic ring is intact. Mild retained fecal material is noted. No obstructive changes are seen. No acute fracture or dislocation is noted. Penile prosthesis is seen. IMPRESSION: No acute bony abnormality noted. Electronically Signed   By: Alcide Clever M.D.   On: 11/09/2022 01:38   DG Chest Port 1 View  Result Date: 11/09/2022 CLINICAL DATA:  Recent fall, initial encounter EXAM: PORTABLE CHEST 1 VIEW COMPARISON:  10/31/2022 FINDINGS: Cardiac shadow is stable. Postsurgical changes are again seen. Lungs are well aerated bilaterally. No focal infiltrate or effusion is noted. No bony abnormality is noted. IMPRESSION: No active disease. Electronically Signed   By: Alcide Clever M.D.   On: 11/09/2022  01:37   CT Cervical Spine Wo Contrast  Result Date: 11/09/2022 CLINICAL DATA:  Patient found down. EXAM: CT CERVICAL SPINE WITHOUT CONTRAST TECHNIQUE: Multidetector CT imaging of the cervical spine was performed without intravenous contrast. Multiplanar CT image reconstructions were also generated. RADIATION DOSE REDUCTION: This exam was performed according to the departmental dose-optimization program which includes automated exposure control, adjustment of the mA and/or kV according to patient size and/or use of iterative  reconstruction technique. COMPARISON:  October 31, 2022 FINDINGS: Alignment: There is approximately 1 mm anterolisthesis of the C7 vertebral body on T1. Skull base and vertebrae: No acute fracture. No primary bone lesion or focal pathologic process. Soft tissues and spinal canal: No prevertebral fluid or swelling. No visible canal hematoma. Disc levels: Mild endplate sclerosis, mild to moderate severity anterior osteophyte formation and moderate severity posterior bony spurring are seen at the levels of C2-C3, C3-C4, C4-C5, C5-C6 and C6-C7. Mild to moderate severity multilevel intervertebral disc space narrowing is seen throughout all levels of the cervical spine. Bilateral moderate severity multilevel facet joint hypertrophy is noted. Upper chest: Negative. Other: None. IMPRESSION: 1. No acute fracture within the cervical spine. 2. Moderate severity multilevel degenerative changes. 3. Approximately 1 mm anterolisthesis of the C7 vertebral body on T1. Electronically Signed   By: Aram Candela M.D.   On: 11/09/2022 01:33   CT Head Wo Contrast  Result Date: 11/09/2022 CLINICAL DATA:  Patient found down. EXAM: CT HEAD WITHOUT CONTRAST TECHNIQUE: Contiguous axial images were obtained from the base of the skull through the vertex without intravenous contrast. RADIATION DOSE REDUCTION: This exam was performed according to the departmental dose-optimization program which includes automated exposure control, adjustment of the mA and/or kV according to patient size and/or use of iterative reconstruction technique. COMPARISON:  October 31, 2022 FINDINGS: Brain: There is moderate severity cerebral atrophy with widening of the extra-axial spaces and ventricular dilatation. There are areas of decreased attenuation within the white matter tracts of the supratentorial brain, consistent with microvascular disease changes. Vascular: No hyperdense vessel or unexpected calcification. Skull: Normal. Negative for fracture or focal  lesion. Sinuses/Orbits: Chronic and postoperative changes are seen involving the medial walls of the bilateral maxillary sinuses. Other: None. IMPRESSION: 1. No acute intracranial abnormality. 2. Cerebral atrophy and microvascular disease changes of the supratentorial brain. Electronically Signed   By: Aram Candela M.D.   On: 11/09/2022 01:30   CT CHEST WO CONTRAST  Result Date: 10/31/2022 CLINICAL DATA:  Chest trauma, blunt.  Left back pain EXAM: CT CHEST WITHOUT CONTRAST TECHNIQUE: Multidetector CT imaging of the chest was performed following the standard protocol without IV contrast. RADIATION DOSE REDUCTION: This exam was performed according to the departmental dose-optimization program which includes automated exposure control, adjustment of the mA and/or kV according to patient size and/or use of iterative reconstruction technique. COMPARISON:  CT abdomen pelvis 11/19/2020 FINDINGS: Cardiovascular: Coronary artery bypass grafting has been performed. Global cardiac size within normal limits. No pericardial effusion. Central pulmonary arteries are enlarged in keeping with changes of pulmonary arterial hypertension. Moderate atherosclerotic calcification within the thoracic aorta. Mild dilation of the ascending thoracic aorta which measures 4.1 cm in greatest dimension. Descending thoracic aorta is of normal caliber. Aberrant right subclavian artery. Mediastinum/Nodes: No enlarged mediastinal or axillary lymph nodes. Thyroid gland, trachea, and esophagus demonstrate no significant findings. Lungs/Pleura: 1.5 x 2.8 cm pulmonary mass has developed within the right posterior basal lower lobe within the costophrenic sulcus. Lungs are otherwise clear. No pneumothorax or pleural  effusion. Upper Abdomen: No acute abnormality. Musculoskeletal: Osseous structures are age-appropriate. No acute bone abnormality. No lytic or blastic bone lesion. IMPRESSION: 1. No acute intrathoracic pathology identified. 2. Solid  pulmonary nodule measuring 28 mm. Per Fleischner Society Guidelines, recommend a PET/CT or tissue sampling. These guidelines do not apply to immunocompromised patients and patients with cancer. Follow up in patients with significant comorbidities as clinically warranted. For lung cancer screening, adhere to Lung-RADS guidelines. Reference: Radiology. 2017; 284(1):228-43. 3. Morphologic changes in keeping with pulmonary arterial hypertension. 4. 4.1 cm ascending thoracic aortic aneurysm. Recommend annual imaging followup by CTA or MRA. This recommendation follows 2010 ACCF/AHA/AATS/ACR/ASA/SCA/SCAI/SIR/STS/SVM Guidelines for the Diagnosis and Management of Patients with Thoracic Aortic Disease. Circulation. 2010; 121: Z610-R604. Aortic aneurysm NOS (ICD10-I71.9) Aortic Atherosclerosis (ICD10-I70.0). Aortic aneurysm NOS (ICD10-I71.9). Electronically Signed   By: Helyn Numbers M.D.   On: 10/31/2022 01:36   CT Head Wo Contrast  Result Date: 10/31/2022 CLINICAL DATA:  Head trauma, intracranial venous injury suspected EXAM: CT HEAD WITHOUT CONTRAST CT CERVICAL SPINE WITHOUT CONTRAST TECHNIQUE: Multidetector CT imaging of the head and cervical spine was performed following the standard protocol without intravenous contrast. Multiplanar CT image reconstructions of the cervical spine were also generated. RADIATION DOSE REDUCTION: This exam was performed according to the departmental dose-optimization program which includes automated exposure control, adjustment of the mA and/or kV according to patient size and/or use of iterative reconstruction technique. COMPARISON:  None Available. FINDINGS: CT HEAD FINDINGS Brain: Normal anatomic configuration. Parenchymal volume loss is commensurate with the patient's age. Stable moderate periventricular white matter changes are present likely reflecting the sequela of small vessel ischemia. No abnormal intra or extra-axial mass lesion or fluid collection. No abnormal mass effect or  midline shift. No evidence of acute intracranial hemorrhage or infarct. Ventricular size is commensurate with the degree of parenchymal volume loss. Cerebellum unremarkable. Vascular: No asymmetric hyperdense vasculature at the skull base. Skull: Intact Sinuses/Orbits: Extensive paranasal sinus surgery has been performed with bilateral ethmoidectomy, sphenoid ectomy, middle turbinectomy, and maxillary antrectomy. Orbits are unremarkable. Other: Mastoid air cells and middle ear cavities are clear. CT CERVICAL SPINE FINDINGS Alignment: Normal. Skull base and vertebrae: No acute fracture. No primary bone lesion or focal pathologic process. Soft tissues and spinal canal: No prevertebral fluid or swelling. No visible canal hematoma. Extensive atherosclerotic calcification within the carotid bifurcations bilaterally. Disc levels: Intervertebral disc space narrowing and endplate remodeling throughout the cervical spine is in keeping with changes of diffuse moderate to severe degenerative disc disease. Prevertebral soft tissues are not thickened on sagittal reformats. Posterior disc herniations at C3-4, C4-5, C5-6, and C6-7 result in mild central canal stenosis, most severe at C4-5. Multilevel uncovertebral and facet arthrosis results in multilevel moderate neuroforaminal narrowing, most severe bilaterally at C3-C6 Upper chest: Negative. Other: None IMPRESSION: 1. No acute intracranial abnormality. No calvarial fracture. 2. Stable senescent change. 3. No acute fracture or listhesis of the cervical spine. 4. Advanced multilevel degenerative disc and degenerative joint disease resulting in multilevel mild central canal stenosis and moderate bilateral neuroforaminal narrowing, most severe at C3-C6. 5. Extensive atherosclerotic calcification within the carotid bifurcations bilaterally. Electronically Signed   By: Helyn Numbers M.D.   On: 10/31/2022 01:20   CT Cervical Spine Wo Contrast  Result Date: 10/31/2022 CLINICAL  DATA:  Head trauma, intracranial venous injury suspected EXAM: CT HEAD WITHOUT CONTRAST CT CERVICAL SPINE WITHOUT CONTRAST TECHNIQUE: Multidetector CT imaging of the head and cervical spine was performed following the standard protocol without intravenous contrast.  Multiplanar CT image reconstructions of the cervical spine were also generated. RADIATION DOSE REDUCTION: This exam was performed according to the departmental dose-optimization program which includes automated exposure control, adjustment of the mA and/or kV according to patient size and/or use of iterative reconstruction technique. COMPARISON:  None Available. FINDINGS: CT HEAD FINDINGS Brain: Normal anatomic configuration. Parenchymal volume loss is commensurate with the patient's age. Stable moderate periventricular white matter changes are present likely reflecting the sequela of small vessel ischemia. No abnormal intra or extra-axial mass lesion or fluid collection. No abnormal mass effect or midline shift. No evidence of acute intracranial hemorrhage or infarct. Ventricular size is commensurate with the degree of parenchymal volume loss. Cerebellum unremarkable. Vascular: No asymmetric hyperdense vasculature at the skull base. Skull: Intact Sinuses/Orbits: Extensive paranasal sinus surgery has been performed with bilateral ethmoidectomy, sphenoid ectomy, middle turbinectomy, and maxillary antrectomy. Orbits are unremarkable. Other: Mastoid air cells and middle ear cavities are clear. CT CERVICAL SPINE FINDINGS Alignment: Normal. Skull base and vertebrae: No acute fracture. No primary bone lesion or focal pathologic process. Soft tissues and spinal canal: No prevertebral fluid or swelling. No visible canal hematoma. Extensive atherosclerotic calcification within the carotid bifurcations bilaterally. Disc levels: Intervertebral disc space narrowing and endplate remodeling throughout the cervical spine is in keeping with changes of diffuse moderate to  severe degenerative disc disease. Prevertebral soft tissues are not thickened on sagittal reformats. Posterior disc herniations at C3-4, C4-5, C5-6, and C6-7 result in mild central canal stenosis, most severe at C4-5. Multilevel uncovertebral and facet arthrosis results in multilevel moderate neuroforaminal narrowing, most severe bilaterally at C3-C6 Upper chest: Negative. Other: None IMPRESSION: 1. No acute intracranial abnormality. No calvarial fracture. 2. Stable senescent change. 3. No acute fracture or listhesis of the cervical spine. 4. Advanced multilevel degenerative disc and degenerative joint disease resulting in multilevel mild central canal stenosis and moderate bilateral neuroforaminal narrowing, most severe at C3-C6. 5. Extensive atherosclerotic calcification within the carotid bifurcations bilaterally. Electronically Signed   By: Helyn Numbers M.D.   On: 10/31/2022 01:20   DG Chest Port 1 View  Result Date: 10/31/2022 CLINICAL DATA:  Dizziness. EXAM: PORTABLE CHEST 1 VIEW COMPARISON:  Chest radiograph dated 09/28/2022. FINDINGS: Faint lucency along the right lateral pleural surface may be artifactual and related to skin fold. A pneumothorax is less likely but not excluded. Clinical correlation and repeat radiograph after repositioning of the patient recommended. No focal consolidation, or pleural effusion. Top-normal cardiac size. Median sternotomy wires and CABG vascular clips. No acute osseous pathology. IMPRESSION: Skin fold artifact versus less likely a right-sided pneumothorax. Electronically Signed   By: Elgie Collard M.D.   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 Patient's vital  signs are stable, however, given leukocytoisis, concern for an underlying infection contributing to persistent delirium. Will order CXR and UA as well. Will empirically start ceftriaxone 1g q24 hours for 5 days and revisit after results have returned. Consider deescalation to augmentin. SLP ordered given coughing with thin liquids. Patient maintains DNR status.     Family/ staff Communication: nursing  Labs/tests ordered:  CXR, UA/Culture

## 2022-11-30 ENCOUNTER — Encounter: Payer: Self-pay | Admitting: Nurse Practitioner

## 2022-11-30 ENCOUNTER — Non-Acute Institutional Stay (SKILLED_NURSING_FACILITY): Payer: Medicare Other | Admitting: Nurse Practitioner

## 2022-11-30 DIAGNOSIS — E43 Unspecified severe protein-calorie malnutrition: Secondary | ICD-10-CM | POA: Diagnosis not present

## 2022-11-30 DIAGNOSIS — R531 Weakness: Secondary | ICD-10-CM | POA: Diagnosis not present

## 2022-11-30 DIAGNOSIS — N390 Urinary tract infection, site not specified: Secondary | ICD-10-CM | POA: Diagnosis not present

## 2022-11-30 DIAGNOSIS — F02C11 Dementia in other diseases classified elsewhere, severe, with agitation: Secondary | ICD-10-CM

## 2022-11-30 DIAGNOSIS — G301 Alzheimer's disease with late onset: Secondary | ICD-10-CM | POA: Diagnosis not present

## 2022-11-30 DIAGNOSIS — N183 Chronic kidney disease, stage 3 unspecified: Secondary | ICD-10-CM | POA: Diagnosis not present

## 2022-11-30 NOTE — Progress Notes (Signed)
Location:  Other [facility].  Nursing Home Room Number: [facility] 103A Place of Service:  SNF (31) [name], NP  PCP: Elenore Paddy, NP  Patient Care Team: Elenore Paddy, NP as PCP - General (Nurse Practitioner) [place], Peter M, MD as PCP - Cardiology (Cardiology)  Extended Emergency Contact Information Primary Emergency Contact: [facility] Address: [address]          [place], [place] [address] Darden Amber of [place] Home Phone: [phone] Mobile Phone: [phone] Relation: Spouse  Goals of care: Advanced Directive information    11/30/2022   11:32 AM  Advanced Directives  Does Patient Have a Medical Advance Directive? Yes  Type of Estate agent of [place];Out of facility DNR (pink MOST or yellow form);Living will  Does patient want to make changes to medical advance directive? No - Patient declined  Copy of Healthcare Power of Attorney in Chart? Yes - validated most recent copy scanned in chart (See row information)     Chief Complaint  Patient presents with   Acute Visit    Family Concerns    HPI:  Pt is a 88 y.o. male seen today for an acute visit for Family Concerns  Pt reports he is no longer having pain. Had rib pain for a while but this has improved.   The first week he was here he was a sleep. Wife felt like his medication needed to be changed for him to do therapy.  He is working with PT and ST.   He is not eating well. Having issus with dysphagia.   His baseline was using a cane and mobile.  He had his routine that he would do daily and wife promoted independence.  He was very disoriented prior to the fall where he went to the hospital.  He did not have significant anxiety or agitation but did start sundowning  Reports he has had some behaviors while in [facility] [place].    He has had diarrhea  Hx of possible serotonin syndrome and was recently hospitalized due to this.   Wife has gotten the home  handicap accessible.  Looking into in home care.   Wife wants to make sure he has a good quality of life and that is why she wants to bring him home because that's what he wants.  Needs a lot of DME.      Past Medical History:  Diagnosis Date   Arthritis    Balance problem    Barrett esophagus    Bradycardia    Coronary artery disease    GERD (gastroesophageal reflux disease)    Gout    History of atrial fibrillation    Previously on amiodarone   Hx of CABG    Hypercholesterolemia    Hypertension    Left renal mass    Melanoma (HCC)    Left shoulder blade   Nocturia    Right epiretinal membrane 02/10/2020   Past Surgical History:  Procedure Laterality Date   CARDIAC CATHETERIZATION  01/04/2010   COLONOSCOPY     CORONARY ARTERY BYPASS GRAFT  02/07/2010   LIMA to LAD, SVG to 2nd DX, SVG to OM   EYE SURGERY Bilateral    Cataract Extraction   HEMORRHOID SURGERY     x 2   HEMORRHOID SURGERY N/A 11/13/2012   Procedure: PPH Hemorrhoidectomy;  Surgeon: Robyne Askew, MD;  Location: [facility] OR;  Service: General;  Laterality: N/A;   INGUINAL HERNIA REPAIR Right 01/22/2017  Procedure: RIGHT INGUINAL HERNIA REPAIR WITH MESH;  Surgeon: Darnell Level, MD;  Location: [facility] OR;  Service: General;  Laterality: Right;   INSERTION OF MESH Right 01/22/2017   Procedure: INSERTION OF MESH;  Surgeon: Darnell Level, MD;  Location: [facility] OR;  Service: General;  Laterality: Right;   IR RADIOLOGIST EVAL & MGMT  01/06/2021   NASAL POLYP EXCISION     OTHER SURGICAL HISTORY  06/05/2002   Penile Implant   ROBOTIC ASSITED PARTIAL NEPHRECTOMY Left 02/25/2021   Procedure: XI ROBOTIC ASSITED PARTIAL NEPHRECTOMY;  Surgeon: Rene Paci, MD;  Location: [facility] ORS;  Service: Urology;  Laterality: Left;   UPPER GI ENDOSCOPY      Allergies  Allergen Reactions   Lexapro [Escitalopram]     Confusion, weakness   Namenda [Memantine]     Insomnia   Lisinopril Other (See Comments)    Cough     Outpatient Encounter Medications as of 11/30/2022  Medication Sig   amLODipine (NORVASC) 10 MG tablet TAKE 1 TABLET BY MOUTH EVERY DAY   ammonium lactate (AMLACTIN) 12 % cream Apply 1 g topically as needed for dry skin.   atorvastatin (LIPITOR) 10 MG tablet Take 0.5 tablets (5 mg total) by mouth daily at 6 PM.   bisacodyl (DULCOLAX) 10 MG suppository Place 10 mg rectally as directed. Every 24 hours as needed for constipation   cetirizine (ZYRTEC) 5 MG tablet Take 5 mg by mouth daily.   docusate sodium (COLACE) 100 MG capsule Take 1 capsule (100 mg total) by mouth 2 (two) times daily.   feeding supplement (ENSURE ENLIVE / ENSURE PLUS) LIQD Take 237 mLs by mouth 3 (three) times daily between meals.   isosorbide mononitrate (IMDUR) 60 MG 24 hr tablet Take 1 tablet (60 mg total) by mouth every evening. Skip the dose if systolic BP less than 140 mmHg   lidocaine (LIDODERM) 5 % Place 1 patch onto the skin daily. Remove & Discard patch within 12 hours or as directed by MD   losartan (COZAAR) 50 MG tablet TAKE 1 TABLET BY MOUTH EVERY DAY   melatonin 5 MG TABS Take 1 tablet (5 mg total) by mouth every evening.   Multiple Vitamin (MULTIVITAMIN) tablet Take 1 tablet by mouth daily.   mupirocin ointment (BACTROBAN) 2 % Apply 1 application  topically 2 (two) times daily.   nitrofurantoin, macrocrystal-monohydrate, (MACROBID) 100 MG capsule Take 100 mg by mouth at bedtime.   nitroGLYCERIN (NITROSTAT) 0.4 MG SL tablet *PLACE 1 TABLET UNDER TONGUE EVERY 5 MINS., UP TO 3 DOSES AS NEEDED FOR CHEST PAIN*   omeprazole (PRILOSEC) 20 MG capsule Take 20 mg by mouth 2 (two) times daily.   QUEtiapine (SEROQUEL) 25 MG tablet Take 12.5 mg by mouth at bedtime.   tamsulosin (FLOMAX) 0.4 MG CAPS capsule Take 1 capsule (0.4 mg total) by mouth daily after supper.   Zinc Oxide (TRIPLE PASTE) 12.8 % ointment Apply 1 Application topically as needed for irritation.   [DISCONTINUED] cefTRIAXone (ROCEPHIN) 1 g injection Inject 1  g into the muscle daily. X 5 days for infection   [DISCONTINUED] trimethoprim-polymyxin b (POLYTRIM) ophthalmic solution Place 1 drop into both eyes 2 (two) times daily.   No facility-administered encounter medications on file as of 11/30/2022.    Review of Systems  Unable to perform ROS: Dementia    Immunization History  Administered Date(s) Administered   PFIZER(Purple Top)SARS-COV-2 Vaccination 08/25/2019, 09/15/2019, 05/18/2020   Pneumococcal Conjugate-13 05/20/2014   Tdap 11/25/2021   Pertinent  Health Maintenance Due  Topic Date Due   INFLUENZA VACCINE  03/01/2023      06/01/2022    2:42 PM 06/08/2022    2:51 PM 08/31/2022    1:07 PM 09/12/2022    3:31 PM 10/13/2022   12:38 PM  Fall Risk  Falls in the past year? 1 1 0 1 1  Was there an injury with Fall? 1 0 0 0 0  Fall Risk Category Calculator 3 2 0 2 2  Fall Risk Category (Retired) High Moderate     (RETIRED) Patient Fall Risk Level High fall risk Moderate fall risk     Patient at Risk for Falls Due to History of fall(s)  History of fall(s)    Patient at Risk for Falls Due to - Comments   cane    Fall risk Follow up Falls evaluation completed Falls evaluation completed Falls prevention discussed Falls evaluation completed Falls evaluation completed   Functional Status Survey:    Vitals:   11/30/22 1121 11/30/22 1134  BP: (!) 165/93 (!) 148/77  Pulse: (!) 57   Resp: 15   Temp: (!) 97.4 F (36.3 C)   SpO2: 96%   Weight: 152 lb (68.9 kg)   Height: 5\' 10"  (1.778 m)    Body mass index is 21.81 kg/m. Physical Exam Constitutional:      General: He is not in acute distress.    Appearance: He is well-developed. He is not diaphoretic.  HENT:     Head: Normocephalic and atraumatic.     Right Ear: External ear normal.     Left Ear: External ear normal.     Mouth/Throat:     Pharynx: No oropharyngeal exudate.  Eyes:     Conjunctiva/sclera: Conjunctivae normal.     Pupils: Pupils are equal, round, and reactive to  light.  Cardiovascular:     Rate and Rhythm: Normal rate and regular rhythm.     Heart sounds: Normal heart sounds.  Pulmonary:     Effort: Pulmonary effort is normal.     Breath sounds: Normal breath sounds.  Abdominal:     General: Bowel sounds are normal.     Palpations: Abdomen is soft.  Musculoskeletal:        General: No tenderness.     Cervical back: Normal range of motion and neck supple.     Right lower leg: No edema.     Left lower leg: No edema.  Skin:    General: Skin is warm and dry.  Neurological:     Mental Status: He is alert. Mental status is at baseline.     Motor: Weakness present.     Gait: Gait abnormal.  Psychiatric:        Cognition and Memory: Cognition is impaired. Memory is impaired. He exhibits impaired recent memory.     Labs reviewed: Recent Labs    11/10/22 0336 11/11/22 0416 11/13/22 0600 11/23/22 0000  NA 140 137 141 148*  K 3.5 3.4* 3.8 4.0  CL 106 106 107 110*  CO2 25 22 26  28*  GLUCOSE 102* 114* 118*  --   BUN 23 25* 31* 62*  CREATININE 0.99 1.16 0.96 1.3  CALCIUM 9.0 8.8* 9.0 9.8  MG 2.0 2.0 2.2  --   PHOS 3.3 3.6 3.6  --    Recent Labs    03/06/22 1349 09/28/22 1756 11/09/22 0103 11/23/22 0000  AST 14 14* 18 83*  ALT 14 9 13  175*  ALKPHOS 57 55 59  95  BILITOT 0.4 0.6 0.8  --   PROT 6.5 6.6 6.3*  --   ALBUMIN 4.2 3.9 3.9 3.4*   Recent Labs    11/09/22 0103 11/10/22 0336 11/11/22 0416 11/13/22 0600 11/23/22 0000  WBC 10.1 7.9 9.4 7.8 20.2  NEUTROABS 7.4  --   --   --  14,483.00  HGB 11.2* 12.0* 10.8* 10.5* 12.4*  HCT 33.9* 35.1* 31.9* 31.1* 38*  MCV 101.5* 99.7 100.6* 100.3*  --   PLT 155 170 173 163 261   Lab Results  Component Value Date   TSH 2.78 03/06/2022   Lab Results  Component Value Date   HGBA1C 5.7 01/27/2022   Lab Results  Component Value Date   CHOL 112 11/24/2021   HDL 37 (L) 11/24/2021   LDLCALC 53 11/24/2021   TRIG 122 11/24/2021   CHOLHDL 3.0 11/24/2021    Significant Diagnostic  Results in last 30 days:  DG Pelvis Portable  Result Date: 11/09/2022 CLINICAL DATA:  Recent fall with pelvic pain, initial encounter EXAM: PORTABLE PELVIS 1 VIEWS COMPARISON:  12/14/2020 FINDINGS: Pelvic ring is intact. Mild retained fecal material is noted. No obstructive changes are seen. No acute fracture or dislocation is noted. Penile prosthesis is seen. IMPRESSION: No acute bony abnormality noted. Electronically Signed   By: Alcide Clever M.D.   On: 11/09/2022 01:38   DG Chest Port 1 View  Result Date: 11/09/2022 CLINICAL DATA:  Recent fall, initial encounter EXAM: PORTABLE CHEST 1 VIEW COMPARISON:  10/31/2022 FINDINGS: Cardiac shadow is stable. Postsurgical changes are again seen. Lungs are well aerated bilaterally. No focal infiltrate or effusion is noted. No bony abnormality is noted. IMPRESSION: No active disease. Electronically Signed   By: Alcide Clever M.D.   On: 11/09/2022 01:37   CT Cervical Spine Wo Contrast  Result Date: 11/09/2022 CLINICAL DATA:  Patient found down. EXAM: CT CERVICAL SPINE WITHOUT CONTRAST TECHNIQUE: Multidetector CT imaging of the cervical spine was performed without intravenous contrast. Multiplanar CT image reconstructions were also generated. RADIATION DOSE REDUCTION: This exam was performed according to the departmental dose-optimization program which includes automated exposure control, adjustment of the mA and/or kV according to patient size and/or use of iterative reconstruction technique. COMPARISON:  October 31, 2022 FINDINGS: Alignment: There is approximately 1 mm anterolisthesis of the C7 vertebral body on T1. Skull base and vertebrae: No acute fracture. No primary bone lesion or focal pathologic process. Soft tissues and spinal canal: No prevertebral fluid or swelling. No visible canal hematoma. Disc levels: Mild endplate sclerosis, mild to moderate severity anterior osteophyte formation and moderate severity posterior bony spurring are seen at the levels of  C2-C3, C3-C4, C4-C5, C5-C6 and C6-C7. Mild to moderate severity multilevel intervertebral disc space narrowing is seen throughout all levels of the cervical spine. Bilateral moderate severity multilevel facet joint hypertrophy is noted. Upper chest: Negative. Other: None. IMPRESSION: 1. No acute fracture within the cervical spine. 2. Moderate severity multilevel degenerative changes. 3. Approximately 1 mm anterolisthesis of the C7 vertebral body on T1. Electronically Signed   By: Aram Candela M.D.   On: 11/09/2022 01:33   CT Head Wo Contrast  Result Date: 11/09/2022 CLINICAL DATA:  Patient found down. EXAM: CT HEAD WITHOUT CONTRAST TECHNIQUE: Contiguous axial images were obtained from the base of the skull through the vertex without intravenous contrast. RADIATION DOSE REDUCTION: This exam was performed according to the departmental dose-optimization program which includes automated exposure control, adjustment of the mA and/or kV according to  patient size and/or use of iterative reconstruction technique. COMPARISON:  October 31, 2022 FINDINGS: Brain: There is moderate severity cerebral atrophy with widening of the extra-axial spaces and ventricular dilatation. There are areas of decreased attenuation within the white matter tracts of the supratentorial brain, consistent with microvascular disease changes. Vascular: No hyperdense vessel or unexpected calcification. Skull: Normal. Negative for fracture or focal lesion. Sinuses/Orbits: Chronic and postoperative changes are seen involving the medial walls of the bilateral maxillary sinuses. Other: None. IMPRESSION: 1. No acute intracranial abnormality. 2. Cerebral atrophy and microvascular disease changes of the supratentorial brain. Electronically Signed   By: Aram Candela M.D.   On: 11/09/2022 01:30    Assessment/Plan 1. Protein-calorie malnutrition, severe (HCC) -continues on supplement  2. Severe late onset Alzheimer's dementia with agitation  (HCC) Advanced dementia, impairing mobility and progression with PT, continues iwht family and staff support  Wife feels like he is overly sedated due to seroquel. Will stop and monitor at this time.   3. Stage 3 chronic kidney disease, unspecified whether stage 3a or 3b CKD (HCC) -Chronic and stable Encourage proper hydration Follow metabolic panel Avoid nephrotoxic meds (NSAIDS)  4. Weakness Ongoing weakness and lack of mobility due to fatigue and debility. Unable to   5. Urinary tract infection without hematuria, site unspecified -currently getting macrobid daily, will dc this and start augmentin 875/125 mg PO BID x 7 days   Particia Strahm K. Biagio Borg [facility] [phone]    Total time 60 mins:  time greater than 50% of total time spent doing pt counseling and coordination of care of dementia, debility and progression of disease.

## 2022-12-04 DIAGNOSIS — I480 Paroxysmal atrial fibrillation: Secondary | ICD-10-CM | POA: Diagnosis not present

## 2022-12-04 DIAGNOSIS — F02811 Dementia in other diseases classified elsewhere, unspecified severity, with agitation: Secondary | ICD-10-CM | POA: Diagnosis not present

## 2022-12-04 DIAGNOSIS — M6281 Muscle weakness (generalized): Secondary | ICD-10-CM | POA: Diagnosis not present

## 2022-12-04 DIAGNOSIS — R55 Syncope and collapse: Secondary | ICD-10-CM | POA: Diagnosis not present

## 2022-12-04 DIAGNOSIS — Z9181 History of falling: Secondary | ICD-10-CM | POA: Diagnosis not present

## 2022-12-13 ENCOUNTER — Encounter: Payer: Self-pay | Admitting: Student

## 2022-12-13 ENCOUNTER — Non-Acute Institutional Stay (SKILLED_NURSING_FACILITY): Payer: Medicare Other | Admitting: Student

## 2022-12-13 DIAGNOSIS — I25708 Atherosclerosis of coronary artery bypass graft(s), unspecified, with other forms of angina pectoris: Secondary | ICD-10-CM

## 2022-12-13 DIAGNOSIS — I10 Essential (primary) hypertension: Secondary | ICD-10-CM | POA: Diagnosis not present

## 2022-12-13 DIAGNOSIS — F0390 Unspecified dementia without behavioral disturbance: Secondary | ICD-10-CM

## 2022-12-13 DIAGNOSIS — N183 Chronic kidney disease, stage 3 unspecified: Secondary | ICD-10-CM | POA: Diagnosis not present

## 2022-12-13 DIAGNOSIS — E43 Unspecified severe protein-calorie malnutrition: Secondary | ICD-10-CM

## 2022-12-13 NOTE — Progress Notes (Signed)
Location:  Other [facility].  Nursing Home Room Number: [facility] 103A Place of Service:  SNF [phone]) Provider:  Dr. Earnestine Mealing  PCP: Elenore Paddy, NP  Patient Care Team: Elenore Paddy, NP as PCP - General (Nurse Practitioner) [place], Peter M, MD as PCP - Cardiology (Cardiology)  Extended Emergency Contact Information Primary Emergency Contact: [name],[name] Address: [address]          [place], [place] [address] Darden Amber of [place] Home Phone: [phone] Mobile Phone: [phone] Relation: Spouse  Code Status:  DNR Goals of care: Advanced Directive information    12/13/2022   12:59 PM  Advanced Directives  Does Patient Have a Medical Advance Directive? Yes  Type of Estate agent of [place];Out of facility DNR (pink MOST or yellow form);Living will  Does patient want to make changes to medical advance directive? No - Patient declined  Copy of Healthcare Power of Attorney in Chart? Yes - validated most recent copy scanned in chart (See row information)     Chief Complaint  Patient presents with   Medical Management of Chronic Issues    Medical Management of Chronic Issues.     HPI:  Pt is a 88 y.o. male seen today for medical management of chronic diseases.    Patient is awake, alert, disoriented.  He can give his wife's name. He is pleasant and able to feed himself. He tells me he worked in [employer] for a long time. He is unable to give location or situation, but is conversational.  Nursing without concerns at this time.   Past Medical History:  Diagnosis Date   Arthritis    Balance problem    Barrett esophagus    Bradycardia    Coronary artery disease    GERD (gastroesophageal reflux disease)    Gout    History of atrial fibrillation    Previously on amiodarone   Hx of CABG    Hypercholesterolemia    Hypertension    Left renal mass    Melanoma (HCC)    Left shoulder blade   Nocturia    Right epiretinal  membrane 02/10/2020   Past Surgical History:  Procedure Laterality Date   CARDIAC CATHETERIZATION  01/04/2010   COLONOSCOPY     CORONARY ARTERY BYPASS GRAFT  02/07/2010   LIMA to LAD, SVG to 2nd DX, SVG to OM   EYE SURGERY Bilateral    Cataract Extraction   HEMORRHOID SURGERY     x 2   HEMORRHOID SURGERY N/A 11/13/2012   Procedure: PPH Hemorrhoidectomy;  Surgeon: Robyne Askew, MD;  Location: [facility] OR;  Service: General;  Laterality: N/A;   INGUINAL HERNIA REPAIR Right 01/22/2017   Procedure: RIGHT INGUINAL HERNIA REPAIR WITH MESH;  Surgeon: Darnell Level, MD;  Location: [facility] OR;  Service: General;  Laterality: Right;   INSERTION OF MESH Right 01/22/2017   Procedure: INSERTION OF MESH;  Surgeon: Darnell Level, MD;  Location: [facility] OR;  Service: General;  Laterality: Right;   IR RADIOLOGIST EVAL & MGMT  01/06/2021   NASAL POLYP EXCISION     OTHER SURGICAL HISTORY  06/05/2002   Penile Implant   ROBOTIC ASSITED PARTIAL NEPHRECTOMY Left 02/25/2021   Procedure: XI ROBOTIC ASSITED PARTIAL NEPHRECTOMY;  Surgeon: Rene Paci, MD;  Location: [facility] ORS;  Service: Urology;  Laterality: Left;   UPPER GI ENDOSCOPY      Allergies  Allergen Reactions   Lexapro [Escitalopram]     Confusion, weakness  Namenda [Memantine]     Insomnia   Lisinopril Other (See Comments)    Cough    Outpatient Encounter Medications as of 12/13/2022  Medication Sig   amLODipine (NORVASC) 10 MG tablet TAKE 1 TABLET BY MOUTH EVERY DAY   ammonium lactate (AMLACTIN) 12 % cream Apply 1 g topically as needed for dry skin.   atorvastatin (LIPITOR) 10 MG tablet Take 0.5 tablets (5 mg total) by mouth daily at 6 PM.   bisacodyl (DULCOLAX) 10 MG suppository Place 10 mg rectally as directed. Every 24 hours as needed for constipation   cetirizine (ZYRTEC) 5 MG tablet Take 5 mg by mouth daily.   docusate sodium (COLACE) 100 MG capsule Take 1 capsule (100 mg total) by mouth 2 (two) times daily.   feeding supplement (ENSURE  ENLIVE / ENSURE PLUS) LIQD Take 237 mLs by mouth 3 (three) times daily between meals.   isosorbide mononitrate (IMDUR) 60 MG 24 hr tablet Take 1 tablet (60 mg total) by mouth every evening. Skip the dose if systolic BP less than 140 mmHg   lidocaine (LIDODERM) 5 % Place 1 patch onto the skin daily. Remove & Discard patch within 12 hours or as directed by MD   losartan (COZAAR) 50 MG tablet TAKE 1 TABLET BY MOUTH EVERY DAY   melatonin 5 MG TABS Take 1 tablet (5 mg total) by mouth every evening.   Multiple Vitamin (MULTIVITAMIN) tablet Take 1 tablet by mouth daily.   nitroGLYCERIN (NITROSTAT) 0.4 MG SL tablet *PLACE 1 TABLET UNDER TONGUE EVERY 5 MINS., UP TO 3 DOSES AS NEEDED FOR CHEST PAIN*   omeprazole (PRILOSEC) 20 MG capsule Take 20 mg by mouth 2 (two) times daily.   QUEtiapine (SEROQUEL) 25 MG tablet Take 12.5 mg by mouth at bedtime.   tamsulosin (FLOMAX) 0.4 MG CAPS capsule Take 1 capsule (0.4 mg total) by mouth daily after supper.   Zinc Oxide (TRIPLE PASTE) 12.8 % ointment Apply 1 Application topically as needed for irritation.   [DISCONTINUED] mupirocin ointment (BACTROBAN) 2 % Apply 1 application  topically 2 (two) times daily.   [DISCONTINUED] nitrofurantoin, macrocrystal-monohydrate, (MACROBID) 100 MG capsule Take 100 mg by mouth at bedtime.   No facility-administered encounter medications on file as of 12/13/2022.    Review of Systems  Immunization History  Administered Date(s) Administered   PFIZER(Purple Top)SARS-COV-2 Vaccination 08/25/2019, 09/15/2019, 05/18/2020   Pneumococcal Conjugate-13 05/20/2014   Tdap 11/25/2021   Pertinent  Health Maintenance Due  Topic Date Due   INFLUENZA VACCINE  03/01/2023      06/01/2022    2:42 PM 06/08/2022    2:51 PM 08/31/2022    1:07 PM 09/12/2022    3:31 PM 10/13/2022   12:38 PM  Fall Risk  Falls in the past year? 1 1 0 1 1  Was there an injury with Fall? 1 0 0 0 0  Fall Risk Category Calculator 3 2 0 2 2  Fall Risk Category  (Retired) High Moderate     (RETIRED) Patient Fall Risk Level High fall risk Moderate fall risk     Patient at Risk for Falls Due to History of fall(s)  History of fall(s)    Patient at Risk for Falls Due to - Comments   cane    Fall risk Follow up Falls evaluation completed Falls evaluation completed Falls prevention discussed Falls evaluation completed Falls evaluation completed   Functional Status Survey:    Vitals:   12/13/22 1250 12/13/22 1259  BP: (!) 162/60  131/80  Pulse: 64   Resp: 20   Temp: 97.7 F (36.5 C)   SpO2: 94%   Weight: 155 lb 6.4 oz (70.5 kg)   Height: 5\' 10"  (1.778 m)    Body mass index is 22.3 kg/m. Physical Exam Cardiovascular:     Rate and Rhythm: Normal rate.     Pulses: Normal pulses.  Pulmonary:     Effort: Pulmonary effort is normal.     Breath sounds: Normal breath sounds.  Abdominal:     Palpations: Abdomen is soft.  Neurological:     Mental Status: He is alert. Mental status is at baseline. He is disoriented.     Labs reviewed: Recent Labs    11/10/22 0336 11/11/22 0416 11/13/22 0600 11/23/22 0000  NA 140 137 141 148*  K 3.5 3.4* 3.8 4.0  CL 106 106 107 110*  CO2 25 22 26  28*  GLUCOSE 102* 114* 118*  --   BUN 23 25* 31* 62*  CREATININE 0.99 1.16 0.96 1.3  CALCIUM 9.0 8.8* 9.0 9.8  MG 2.0 2.0 2.2  --   PHOS 3.3 3.6 3.6  --    Recent Labs    03/06/22 1349 09/28/22 1756 11/09/22 0103 11/23/22 0000  AST 14 14* 18 83*  ALT 14 9 13  175*  ALKPHOS 57 55 59 95  BILITOT 0.4 0.6 0.8  --   PROT 6.5 6.6 6.3*  --   ALBUMIN 4.2 3.9 3.9 3.4*   Recent Labs    11/09/22 0103 11/10/22 0336 11/11/22 0416 11/13/22 0600 11/23/22 0000  WBC 10.1 7.9 9.4 7.8 20.2  NEUTROABS 7.4  --   --   --  14,483.00  HGB 11.2* 12.0* 10.8* 10.5* 12.4*  HCT 33.9* 35.1* 31.9* 31.1* 38*  MCV 101.5* 99.7 100.6* 100.3*  --   PLT 155 170 173 163 261   Lab Results  Component Value Date   TSH 2.78 03/06/2022   Lab Results  Component Value Date    HGBA1C 5.7 01/27/2022   Lab Results  Component Value Date   CHOL 112 11/24/2021   HDL 37 (L) 11/24/2021   LDLCALC 53 11/24/2021   TRIG 122 11/24/2021   CHOLHDL 3.0 11/24/2021    Significant Diagnostic Results in last 30 days:  No results found.  Assessment/Plan 1. Dementia, unspecified dementia severity, unspecified dementia type, unspecified whether behavioral, psychotic, or mood disturbance or anxiety [facility]) Patient initially admitted after a fall and increased confusion. Patient has had treatment for UTI and is improving from a mental status standpoint. He still has significant debility requiring 24 hour care, however, the plan will be for him to return home with his wife at home with a nursing aid. Patient is no longer having significant outbursts, but does take redirection to stay seated instead of attempting ambulation. Seroquel 12.5 mg daily prn.   2. Stage 3 chronic kidney disease, unspecified whether stage 3a or 3b CKD (HCC) Stable, avoid nephrotoxic medications.   3. Essential hypertension BP well-controlled Continue amlodipine, cozaar 50 mg, imdur for BP.   4. Coronary artery disease of bypass graft of native heart with stable angina pectoris (HCC) No chest pain at this time, nitroglycerine available as needed. Continue atorvastatin 10 mg daily.   5. Protein-calorie malnutrition, severe (HCC) Protein supplementation as tolerated.    Family/ staff Communication: nursing  Labs/tests ordered:  none

## 2022-12-14 ENCOUNTER — Other Ambulatory Visit: Payer: Self-pay | Admitting: Nurse Practitioner

## 2022-12-14 ENCOUNTER — Ambulatory Visit: Payer: Medicare Other | Admitting: Nurse Practitioner

## 2022-12-14 DIAGNOSIS — K219 Gastro-esophageal reflux disease without esophagitis: Secondary | ICD-10-CM

## 2022-12-14 MED ORDER — OMEPRAZOLE 20 MG PO CPDR
20.0000 mg | DELAYED_RELEASE_CAPSULE | Freq: Two times a day (BID) | ORAL | 1 refills | Status: DC
Start: 2022-12-14 — End: 2023-05-31

## 2022-12-29 ENCOUNTER — Encounter: Payer: Self-pay | Admitting: Student

## 2022-12-29 ENCOUNTER — Telehealth: Payer: Self-pay

## 2022-12-29 ENCOUNTER — Non-Acute Institutional Stay (SKILLED_NURSING_FACILITY): Payer: Medicare Other | Admitting: Student

## 2022-12-29 DIAGNOSIS — G301 Alzheimer's disease with late onset: Secondary | ICD-10-CM

## 2022-12-29 DIAGNOSIS — Z66 Do not resuscitate: Secondary | ICD-10-CM

## 2022-12-29 DIAGNOSIS — F02C11 Dementia in other diseases classified elsewhere, severe, with agitation: Secondary | ICD-10-CM

## 2022-12-29 NOTE — Telephone Encounter (Signed)
1015 am.  Message received from [facility], SW to return call to wife regarding discharge from [facility].  Return call made.  No answer.  Message left on VM requesting call back.

## 2022-12-29 NOTE — Telephone Encounter (Signed)
Called and let voice mail to give our office a call

## 2022-12-29 NOTE — Telephone Encounter (Signed)
(  8:39 am) Patient's wife called in requesting follow-up from palliative care team. She is expecting her husband to be discharged soon from [facility] and would like his services resumed. SW forwarded follow-up request to [facility] team.

## 2022-12-29 NOTE — Progress Notes (Unsigned)
Location:  Other [place].  Nursing Home Room Number: [facility] [address] Place of Service:  SNF [phone]) Provider:  Dr. Earnestine Mealing  PCP: Elenore Paddy, NP  Patient Care Team: Elenore Paddy, NP as PCP - General (Nurse Practitioner) [place], Peter M, MD as PCP - Cardiology (Cardiology)  Extended Emergency Contact Information Primary Emergency Contact: [name],[name] Address: [address]          [place], [place] [address] [place] of [place] Home Phone: [phone] Mobile Phone: [phone] Relation: Spouse  Code Status:  DNR Goals of care: Advanced Directive information    12/29/2022    1:25 PM  Advanced Directives  Does Patient Have a Medical Advance Directive? Yes  Type of Estate agent of [place];Out of facility DNR (pink MOST or yellow form);Living will  Does patient want to make changes to medical advance directive? No - Patient declined  Copy of Healthcare Power of Attorney in Chart? Yes - validated most recent copy scanned in chart (See row information)     Chief Complaint  Patient presents with   Acute Visit    Behaviors.     HPI:  Pt is a 88 y.o. male seen today for an acute visit for Behaviors.   Spoke to patient he is pleasant and oriented to self only.   Patient is more alert in the last 2 weeks, however, his delusions and behaviors have been increasingly concerning. He is yelling and cursing at patients and residents. He had to be removed from the meal table earlier this week because his actions were disturbing other patients.   Spent >45 minutes discussing patient's current state with his wife. She is crying and distressed about his current state. She wants to bring him home, but isn't sure she can afford at home 24/7 care for him. She is on the wait list to live at [facility]. She wants to continue to advocate for him as we navigate this season. She understands he is at the final stages of alzheimer's and wants to  make the right decision. Would like to discuss with chaplain and social work next steps. Discussed hospice vs palliative care and she would like to maintain palliative care at this time. Reiterates patient has a DNR.    Past Medical History:  Diagnosis Date   Arthritis    Balance problem    Barrett esophagus    Bradycardia    Coronary artery disease    GERD (gastroesophageal reflux disease)    Gout    History of atrial fibrillation    Previously on amiodarone   Hx of CABG    Hypercholesterolemia    Hypertension    Left renal mass    Melanoma (HCC)    Left shoulder blade   Nocturia    Right epiretinal membrane 02/10/2020   Past Surgical History:  Procedure Laterality Date   CARDIAC CATHETERIZATION  01/04/2010   COLONOSCOPY     CORONARY ARTERY BYPASS GRAFT  02/07/2010   LIMA to LAD, SVG to 2nd DX, SVG to OM   EYE SURGERY Bilateral    Cataract Extraction   HEMORRHOID SURGERY     x 2   HEMORRHOID SURGERY N/A 11/13/2012   Procedure: PPH Hemorrhoidectomy;  Surgeon: Robyne Askew, MD;  Location: [facility] OR;  Service: General;  Laterality: N/A;   INGUINAL HERNIA REPAIR Right 01/22/2017   Procedure: RIGHT INGUINAL HERNIA REPAIR WITH MESH;  Surgeon: Darnell Level, MD;  Location: [facility] OR;  Service:  General;  Laterality: Right;   INSERTION OF MESH Right 01/22/2017   Procedure: INSERTION OF MESH;  Surgeon: Darnell Level, MD;  Location: [facility] OR;  Service: General;  Laterality: Right;   IR RADIOLOGIST EVAL & MGMT  01/06/2021   NASAL POLYP EXCISION     OTHER SURGICAL HISTORY  06/05/2002   Penile Implant   ROBOTIC ASSITED PARTIAL NEPHRECTOMY Left 02/25/2021   Procedure: XI ROBOTIC ASSITED PARTIAL NEPHRECTOMY;  Surgeon: Rene Paci, MD;  Location: [facility] ORS;  Service: Urology;  Laterality: Left;   UPPER GI ENDOSCOPY      Allergies  Allergen Reactions   Lexapro [Escitalopram]     Confusion, weakness   Namenda [Memantine]     Insomnia   Lisinopril Other (See Comments)    Cough     Outpatient Encounter Medications as of 12/29/2022  Medication Sig   acetaminophen (TYLENOL) 500 MG tablet Take 1,000 mg by mouth every 8 (eight) hours as needed.   amLODipine (NORVASC) 10 MG tablet TAKE 1 TABLET BY MOUTH EVERY DAY   ammonium lactate (AMLACTIN) 12 % cream Apply 1 g topically as needed for dry skin.   atorvastatin (LIPITOR) 10 MG tablet Take 0.5 tablets (5 mg total) by mouth daily at 6 PM.   bisacodyl (DULCOLAX) 10 MG suppository Place 10 mg rectally as directed. Every 24 hours as needed for constipation   cetirizine (ZYRTEC) 5 MG tablet Take 5 mg by mouth daily.   docusate sodium (COLACE) 100 MG capsule Take 1 capsule (100 mg total) by mouth 2 (two) times daily.   feeding supplement (ENSURE ENLIVE / ENSURE PLUS) LIQD Take 237 mLs by mouth 3 (three) times daily between meals.   isosorbide mononitrate (IMDUR) 60 MG 24 hr tablet Take 1 tablet (60 mg total) by mouth every evening. Skip the dose if systolic BP less than 140 mmHg   lidocaine (LIDODERM) 5 % Place 1 patch onto the skin daily. Remove & Discard patch within 12 hours or as directed by MD   losartan (COZAAR) 50 MG tablet TAKE 1 TABLET BY MOUTH EVERY DAY   melatonin 5 MG TABS Take 1 tablet (5 mg total) by mouth every evening.   Multiple Vitamin (MULTIVITAMIN) tablet Take 1 tablet by mouth daily.   mupirocin ointment (BACTROBAN) 2 % Apply 1 Application topically 2 (two) times daily.   nitroGLYCERIN (NITROSTAT) 0.4 MG SL tablet *PLACE 1 TABLET UNDER TONGUE EVERY 5 MINS., UP TO 3 DOSES AS NEEDED FOR CHEST PAIN*   omeprazole (PRILOSEC) 20 MG capsule Take 1 capsule (20 mg total) by mouth 2 (two) times daily.   polyethylene glycol (MIRALAX / GLYCOLAX) 17 g packet Take 17 g by mouth daily as needed.   QUEtiapine (SEROQUEL) 25 MG tablet Take 12.5 mg by mouth at bedtime. Give 12.5mg  by mouth every 24 hours as needed for increased agitation.   tamsulosin (FLOMAX) 0.4 MG CAPS capsule Take 1 capsule (0.4 mg total) by mouth daily  after supper.   Zinc Oxide (TRIPLE PASTE) 12.8 % ointment Apply 1 Application topically as needed for irritation.   No facility-administered encounter medications on file as of 12/29/2022.    Review of Systems  Immunization History  Administered Date(s) Administered   PFIZER(Purple Top)SARS-COV-2 Vaccination 08/25/2019, 09/15/2019, 05/18/2020   Pneumococcal Conjugate-13 05/20/2014   Tdap 11/25/2021   Pertinent  Health Maintenance Due  Topic Date Due   INFLUENZA VACCINE  03/01/2023      06/01/2022    2:42 PM 06/08/2022    2:51  PM 08/31/2022    1:07 PM 09/12/2022    3:31 PM 10/13/2022   12:38 PM  Fall Risk  Falls in the past year? 1 1 0 1 1  Was there an injury with Fall? 1 0 0 0 0  Fall Risk Category Calculator 3 2 0 2 2  Fall Risk Category (Retired) High Moderate     (RETIRED) Patient Fall Risk Level High fall risk Moderate fall risk     Patient at Risk for Falls Due to History of fall(s)  History of fall(s)    Patient at Risk for Falls Due to - Comments   cane    Fall risk Follow up Falls evaluation completed Falls evaluation completed Falls prevention discussed Falls evaluation completed Falls evaluation completed   Functional Status Survey:    Vitals:   12/29/22 1316  BP: 115/62  Pulse: 65  Resp: 18  Temp: 97.7 F (36.5 C)  SpO2: 94%  Weight: 155 lb 6.4 oz (70.5 kg)  Height: 5\' 10"  (1.778 m)   Body mass index is 22.3 kg/m. Physical Exam  Labs reviewed: Recent Labs    11/10/22 0336 11/11/22 0416 11/13/22 0600 11/23/22 0000  NA 140 137 141 148*  K 3.5 3.4* 3.8 4.0  CL 106 106 107 110*  CO2 25 22 26  28*  GLUCOSE 102* 114* 118*  --   BUN 23 25* 31* 62*  CREATININE 0.99 1.16 0.96 1.3  CALCIUM 9.0 8.8* 9.0 9.8  MG 2.0 2.0 2.2  --   PHOS 3.3 3.6 3.6  --    Recent Labs    03/06/22 1349 09/28/22 1756 11/09/22 0103 11/23/22 0000  AST 14 14* 18 83*  ALT 14 9 13  175*  ALKPHOS 57 55 59 95  BILITOT 0.4 0.6 0.8  --   PROT 6.5 6.6 6.3*  --   ALBUMIN 4.2  3.9 3.9 3.4*   Recent Labs    11/09/22 0103 11/10/22 0336 11/11/22 0416 11/13/22 0600 11/23/22 0000  WBC 10.1 7.9 9.4 7.8 20.2  NEUTROABS 7.4  --   --   --  14,483.00  HGB 11.2* 12.0* 10.8* 10.5* 12.4*  HCT 33.9* 35.1* 31.9* 31.1* 38*  MCV 101.5* 99.7 100.6* 100.3*  --   PLT 155 170 173 163 261   Lab Results  Component Value Date   TSH 2.78 03/06/2022   Lab Results  Component Value Date   HGBA1C 5.7 01/27/2022   Lab Results  Component Value Date   CHOL 112 11/24/2021   HDL 37 (L) 11/24/2021   LDLCALC 53 11/24/2021   TRIG 122 11/24/2021   CHOLHDL 3.0 11/24/2021    Significant Diagnostic Results in last 30 days:  No results found.  Assessment/Plan Severe late onset Alzheimer's dementia with agitation (HCC)  DNR (do not resuscitate) Patient with worsening behaviors. Has seroquel 12.5 mg scheduled at night, however, has agitation during the day as well. Will add seroquel 12.5 mg BID prn for agitation and behaviors. Continue goals of care conversations with spouse. Offered emotional support as well as resources such as Orthoptist and social work. Discussed dementia support group on campus as well. Discussed how hospice can support her emotional during this period and beyond,declined.    Family/ staff Communication: nursing, spouse  Labs/tests ordered:  none  I spent greater than 35  minutes for the care of this patient in face to face time, chart review, clinical documentation, patient education. I spent an additional 16 minutes discussing goals of care and advanced  care planning.

## 2022-12-29 NOTE — Telephone Encounter (Signed)
Pts wife has called and asked why the request of the pts medical records have not been faxed over as she has requested this fax over a week ago when speaking with Delorise Shiner.  I was able to inform her that the medical records department will have to send them if its not the last office notes etc that we can send ourselves.  Pt wife is asking Vic Ripper a call as she wants to speak with Delorise Shiner.

## 2022-12-31 DIAGNOSIS — Z66 Do not resuscitate: Secondary | ICD-10-CM | POA: Insufficient documentation

## 2023-01-02 ENCOUNTER — Other Ambulatory Visit: Payer: Medicare Other

## 2023-01-02 DIAGNOSIS — Z515 Encounter for palliative care: Secondary | ICD-10-CM

## 2023-01-02 NOTE — Progress Notes (Signed)
PATIENT NAME: CINCH RIPPLE DOB: 10-28-1934 MRN: [id number]  PRIMARY CARE PROVIDER: Elenore Paddy, NP  RESPONSIBLE PARTY:  Acct ID - Guarantor Home Phone Work Phone Relationship Acct Type  [id number] ROHAIL, SCHMOLDT* [phone]  Self P/F     [address] [place], [place] [address]    I connected with  [name]-wife for RICKY DIGNAM on 01/02/23 by telephone and verified that I am speaking with the correct person using two identifiers.   I discussed the limitations of evaluation and management by telemedicine. The patient expressed understanding and agreed to proceed.   Message received from patient's wife that she is needing a hospice referral for patient.  Return call made and spoke with Carney.  She states there are decisions she needs to make for patient.  She is uncertain if will be able to bring patient home with 24/7 care and is strongly considering keeping him at [facility].  Gershon Cull advised she has spoken with provider at facility and staff and they both feel patient would benefit from hospice support given his decline.  Wife feels this it would be appropriate for a hospice evaluation and would like to pursue this.  She is with patient most of the day and has requested that the intake center send her a text and she can step out to call back.  Discussed process of needing an order for referral and that I would notify our intake office of this need.  Patient is followed by Ruben Gottron, NP and Earnestine Mealing, MD.  Message has been sent to the [facility] Referral Intake.   CODE STATUS: DNR ADVANCED DIRECTIVES: Yes MOST FORM: No         Truitt Merle, RN

## 2023-01-03 ENCOUNTER — Non-Acute Institutional Stay: Payer: Medicare Other | Admitting: Hospice

## 2023-01-03 DIAGNOSIS — F03911 Unspecified dementia, unspecified severity, with agitation: Secondary | ICD-10-CM

## 2023-01-03 DIAGNOSIS — R531 Weakness: Secondary | ICD-10-CM

## 2023-01-03 DIAGNOSIS — I1310 Hypertensive heart and chronic kidney disease without heart failure, with stage 1 through stage 4 chronic kidney disease, or unspecified chronic kidney disease: Secondary | ICD-10-CM

## 2023-01-03 DIAGNOSIS — F03918 Unspecified dementia, unspecified severity, with other behavioral disturbance: Secondary | ICD-10-CM

## 2023-01-03 DIAGNOSIS — Z515 Encounter for palliative care: Secondary | ICD-10-CM

## 2023-01-03 NOTE — Progress Notes (Signed)
Therapist, nutritional Palliative Care Consult Note Telephone: [phone]  Fax: [phone]  PATIENT NAME: Zachary Hall [address] [place] [address] [phone] (home)  DOB: 1935/03/28 MRN: [id number]  PRIMARY CARE PROVIDER:    Elenore Paddy, NP,  [address] [place] [address] [phone]  REFERRING PROVIDER:   Dr. Earnestine Mealing  RESPONSIBLE PARTY:    Contact Information     Name Relation Home Work Mobile   [name],[name] Spouse [phone]  [phone]        I met face to face with patient in the facility. Visit to build trust and highlight Palliative Medicine as specialized medical care for people living with serious illness, aimed at facilitating better quality of life through symptoms relief, assisting with advance care planning and complex medical decision making.  NP called Gershon Cull and left her a voicemail with callback number.  Palliative care team will continue to support patient, patient's family, and medical team.  ASSESSMENT AND / RECOMMENDATIONS:  ------------------------------------------------------------------------------------------------------------------------------------------------- Advance Care Planning: Our advance care planning conversation included a discussion about:    The value and importance of advance care planning  Difference between Hospice and Palliative care Exploration of goals of care in the event of a sudden injury or illness  Identification and preparation of a healthcare agent  Review and updating or creation of an  advance directive document . Decision not to resuscitate or to de-escalate disease focused treatments due to poor prognosis.  CODE STATUS: Patient is a DO NOT RESUSCITATE  Goals of Care: Goals include to maximize quality of life and symptom management.  Report from nursing that spouse wanted hospice service so hospice can pay for his room and boarding.   Collaborative discussion with PCP-to hold hospice admission until spouse clarifies what she wants.  [name] later called back  and was updated on visit. She said her was to was to bring patient back home but that is no longer an option at this time because of his increasing dependence in ADLs and behavior issues.  She is exploring placement in the facility.  She endorsed patient being followed by palliative care.  She is open to hospice service in the future when patient qualifies for it.  Gershon Cull provided additional history as patient with cognitive deficits.  I spent 25 minutes providing this initial consultation. More than 50% of the time in this consultation was spent on counseling patient and coordinating communication. --------------------------------------------------------------------------------------------------------------------------------------  Symptom Management/Plan: Dementia: Advanced dementia-progressing memory loss/confusion, impoverished thought, limited language, nonambulatory, incontinent of bowel and bladder, FAST 7A.  Continue ongoing supportive care.  Fall/safety precautions.  Agitation: Worsening agitation worsening.  Managed with Seroquel. Quitepine nearly started due to worsening agitation,   end date 01/13/23. UA negative.  Continue Seroquel and quetiapine as ordered.  Use de-escalation techniques.  Psych consult as needed.   Hypertensive heart disease:Continue Losartan, Amlodipine, Isosorbide as ordered. Follow up with Cardiologst as planned.  Routine CBC CMP.  Weakness.  Worsening, currently nonambulatory. PT/OT is ongoing for strengthning and mobility, max assist in ADLs, feeds self after set sometimes.   Follow up: Palliative care will continue to follow for complex medical decision making, advance care planning, and clarification of goals. Return 6 weeks or prn. Encouraged to call provider sooner with any concerns.   Family /Caregiver/Community Supports:  Patient in SNF for acute rehab.  HOSPICE ELIGIBILITY/DIAGNOSIS: TBD  Chief Complaint: Initial Palliative care visit  HISTORY OF PRESENT ILLNESS:  Zachary Hall is a  88 y.o. year old male  with multiple morbidities requiring close monitoring/management, and with high risk of complications and  mortality: Advanced dementia, agitation, generalized weakness, debility.  History of renal cell carcinoma with partial left nephrectomy August 2022.  Patient denied pain/discomfort, FLACC 0, not able to engage in meaningful conversation. History obtained from review of EMR, discussion with primary team, caregiver, family and/or Mr. Lothian.  Review and summarization of Epic records shows history from other than patient. Rest of 10 point ROS asked and negative. Independent interpretation of tests and reviewed as needed, available labs, patient records, imaging, studies and related documents from the EMR.  PHYSICAL EXAM: GEN: in no acute distress  Cardiac: S1 S2, no LE edema feet/ankles  Respiratory:  clear to auscultation bilaterally, GI: soft, nontender,  + BS   MS: Nonambulatory  Neuro: Generalized weakness, nonfocal, alert and oriented x 1 Psych: non-anxious affect   PAST MEDICAL HISTORY:  Active Ambulatory Problems    Diagnosis Date Noted   Coronary artery disease of bypass graft of native heart with stable angina pectoris (HCC)    History of atrial fibrillation    Sinus bradycardia with RBBB 11/21/2010   RBBB (right bundle branch block) 09/02/2013   H/O coronary artery bypass surgery 12/15/2010   Arteriosclerosis of coronary artery 05/04/2014   Essential hypertension 09/16/2015   Hyperlipidemia 09/16/2015   Right inguinal hernia 01/21/2017   Right epiretinal membrane 02/10/2020   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   Pseudophakia of both eyes 02/10/2020   Posterior vitreous detachment of right eye 02/10/2020   Renal mass 02/25/2021   Presence of aortocoronary bypass graft  12/15/2010   Stasis dermatitis 05/14/2014   History of partial nephrectomy 08/21/2021   Type A blood, Rh positive 08/21/2021   Hyponatremia 08/21/2021   IFG (impaired fasting glucose) 08/21/2021   Low hemoglobin 08/21/2021   Macular pucker, left eye 08/23/2021   Balance problem 01/27/2022   Hyperglycemia 01/27/2022   Dementia (HCC) 01/27/2022   Unintentional weight loss 02/23/2022   Fatigue 02/23/2022   Thrombocytopenia (HCC) 04/13/2022   Fall 04/13/2022   Anemia 05/11/2022   CKD (chronic kidney disease) stage 3, GFR 30-59 ml/min (HCC) 06/01/2022   Constipation 06/01/2022   History of renal cell carcinoma s/p partial left nephrectomy 01/2021 08/31/2022   Knee joint stiffness, bilateral 08/31/2022   DDD (degenerative disc disease), cervical 10/20/2022   Syncope and collapse 11/09/2022   Hypertensive urgency 11/09/2022   Protein-calorie malnutrition, severe (HCC) 11/13/2022   Malignant neoplasm of left kidney, except renal pelvis (HCC) 11/15/2022   DNR (do not resuscitate) 12/31/2022   Resolved Ambulatory Problems    Diagnosis Date Noted   Hypertension    Hypercholesterolemia    History of chest pain    Arm pain 11/21/2010   Hx of CABG 12/15/2010   Hemorrhoids, internal, with bleeding 07/08/2012   Prolapsed internal hemorrhoids 09/10/2012   Bifascicular block 09/02/2013   H/O disease 05/04/2014   Memory disorder 05/31/2015   Memory loss 09/16/2015   Mild cognitive impairment 09/29/2016   Atopic dermatitis 05/18/2015   Dementia due to Alzheimer's disease (HCC) 09/07/2021   Past Medical History:  Diagnosis Date   Arthritis    Barrett esophagus    Bradycardia    Coronary artery disease    GERD (gastroesophageal reflux disease)    Gout    Left renal mass    Melanoma (HCC)    Nocturia     SOCIAL HX:  Social History   Tobacco Use  Smoking status: Former    Types: Cigarettes    Quit date: 01/07/1961    Years since quitting: 62.0   Smokeless tobacco: Former   Substance Use Topics   Alcohol use: Yes    Alcohol/week: 4.0 standard drinks of alcohol    Types: 4 Cans of beer per week     FAMILY HX:  Family History  Problem Relation Age of Onset   Alcohol abuse Father    Colon cancer Mother    Cancer Mother        colon and bone   Heart attack Neg Hx    Hypertension Neg Hx    Stroke Neg Hx       ALLERGIES:  Allergies  Allergen Reactions   Lexapro [Escitalopram]     Confusion, weakness   Namenda [Memantine]     Insomnia   Lisinopril Other (See Comments)    Cough      PERTINENT MEDICATIONS:  Outpatient Encounter Medications as of 01/03/2023  Medication Sig   acetaminophen (TYLENOL) 500 MG tablet Take 1,000 mg by mouth every 8 (eight) hours as needed.   amLODipine (NORVASC) 10 MG tablet TAKE 1 TABLET BY MOUTH EVERY DAY   ammonium lactate (AMLACTIN) 12 % cream Apply 1 g topically as needed for dry skin.   atorvastatin (LIPITOR) 10 MG tablet Take 0.5 tablets (5 mg total) by mouth daily at 6 PM.   bisacodyl (DULCOLAX) 10 MG suppository Place 10 mg rectally as directed. Every 24 hours as needed for constipation   cetirizine (ZYRTEC) 5 MG tablet Take 5 mg by mouth daily.   docusate sodium (COLACE) 100 MG capsule Take 1 capsule (100 mg total) by mouth 2 (two) times daily.   feeding supplement (ENSURE ENLIVE / ENSURE PLUS) LIQD Take 237 mLs by mouth 3 (three) times daily between meals.   isosorbide mononitrate (IMDUR) 60 MG 24 hr tablet Take 1 tablet (60 mg total) by mouth every evening. Skip the dose if systolic BP less than 140 mmHg   lidocaine (LIDODERM) 5 % Place 1 patch onto the skin daily. Remove & Discard patch within 12 hours or as directed by MD   losartan (COZAAR) 50 MG tablet TAKE 1 TABLET BY MOUTH EVERY DAY   melatonin 5 MG TABS Take 1 tablet (5 mg total) by mouth every evening.   Multiple Vitamin (MULTIVITAMIN) tablet Take 1 tablet by mouth daily.   mupirocin ointment (BACTROBAN) 2 % Apply 1 Application topically 2 (two) times  daily.   nitroGLYCERIN (NITROSTAT) 0.4 MG SL tablet *PLACE 1 TABLET UNDER TONGUE EVERY 5 MINS., UP TO 3 DOSES AS NEEDED FOR CHEST PAIN*   omeprazole (PRILOSEC) 20 MG capsule Take 1 capsule (20 mg total) by mouth 2 (two) times daily.   polyethylene glycol (MIRALAX / GLYCOLAX) 17 g packet Take 17 g by mouth daily as needed.   QUEtiapine (SEROQUEL) 25 MG tablet Take 12.5 mg by mouth at bedtime. Give 12.5mg  by mouth every 24 hours as needed for increased agitation.   tamsulosin (FLOMAX) 0.4 MG CAPS capsule Take 1 capsule (0.4 mg total) by mouth daily after supper.   Zinc Oxide (TRIPLE PASTE) 12.8 % ointment Apply 1 Application topically as needed for irritation.   No facility-administered encounter medications on file as of 01/03/2023.     Thank you for the opportunity to participate in the care of Mr. Belew.  The palliative care team will continue to follow. Please call our office at [phone] if we can be of additional  assistance.   Note: Portions of this note were generated with Scientist, clinical (histocompatibility and immunogenetics). Dictation errors may occur despite best attempts at proofreading.  Rosaura Carpenter, NP

## 2023-01-04 ENCOUNTER — Encounter: Payer: Self-pay | Admitting: Nurse Practitioner

## 2023-01-04 ENCOUNTER — Non-Acute Institutional Stay (SKILLED_NURSING_FACILITY): Payer: Medicare Other | Admitting: Nurse Practitioner

## 2023-01-04 DIAGNOSIS — F02811 Dementia in other diseases classified elsewhere, unspecified severity, with agitation: Secondary | ICD-10-CM | POA: Diagnosis not present

## 2023-01-04 DIAGNOSIS — R55 Syncope and collapse: Secondary | ICD-10-CM | POA: Diagnosis not present

## 2023-01-04 DIAGNOSIS — H1033 Unspecified acute conjunctivitis, bilateral: Secondary | ICD-10-CM | POA: Diagnosis not present

## 2023-01-04 DIAGNOSIS — I480 Paroxysmal atrial fibrillation: Secondary | ICD-10-CM | POA: Diagnosis not present

## 2023-01-04 DIAGNOSIS — M6281 Muscle weakness (generalized): Secondary | ICD-10-CM | POA: Diagnosis not present

## 2023-01-04 DIAGNOSIS — Z9181 History of falling: Secondary | ICD-10-CM | POA: Diagnosis not present

## 2023-01-04 NOTE — Progress Notes (Signed)
Location:  Other [facility] [place].  Nursing Home Room Number: [facility] 103A Place of Service:  SNF (31) [name], NP  PCP: Elenore Paddy, NP  Patient Care Team: Elenore Paddy, NP as PCP - General (Nurse Practitioner) [place], Peter M, MD as PCP - Cardiology (Cardiology)  Extended Emergency Contact Information Primary Emergency Contact: [name],[name] Address: [address]          [place], [place] [address] [place] of [place] Home Phone: [phone] Mobile Phone: [phone] Relation: Spouse  Goals of care: Advanced Directive information    01/04/2023    2:18 PM  Advanced Directives  Does Patient Have a Medical Advance Directive? Yes  Type of Estate agent of [place];Out of facility DNR (pink MOST or yellow form);Living will  Does patient want to make changes to medical advance directive? No - Patient declined  Copy of Healthcare Power of Attorney in Chart? Yes - validated most recent copy scanned in chart (See row information)     Chief Complaint  Patient presents with   Acute Visit    Crusty Eyes.     HPI:  Pt is a 88 y.o. male seen today for an acute visit for increase in drainage to bilateral eyes Hx of crusting/conjunctivitis  and was recently on antibiotic but now has returned. Red eye with yellow drainage bilateral noted.      Past Medical History:  Diagnosis Date   Arthritis    Balance problem    Barrett esophagus    Bradycardia    Coronary artery disease    GERD (gastroesophageal reflux disease)    Gout    History of atrial fibrillation    Previously on amiodarone   Hx of CABG    Hypercholesterolemia    Hypertension    Left renal mass    Melanoma (HCC)    Left shoulder blade   Nocturia    Right epiretinal membrane 02/10/2020   Past Surgical History:  Procedure Laterality Date   CARDIAC CATHETERIZATION  01/04/2010   COLONOSCOPY     CORONARY ARTERY BYPASS GRAFT  02/07/2010   LIMA to LAD, SVG to 2nd  DX, SVG to OM   EYE SURGERY Bilateral    Cataract Extraction   HEMORRHOID SURGERY     x 2   HEMORRHOID SURGERY N/A 11/13/2012   Procedure: PPH Hemorrhoidectomy;  Surgeon: Robyne Askew, MD;  Location: [facility] OR;  Service: General;  Laterality: N/A;   INGUINAL HERNIA REPAIR Right 01/22/2017   Procedure: RIGHT INGUINAL HERNIA REPAIR WITH MESH;  Surgeon: Darnell Level, MD;  Location: [facility] OR;  Service: General;  Laterality: Right;   INSERTION OF MESH Right 01/22/2017   Procedure: INSERTION OF MESH;  Surgeon: Darnell Level, MD;  Location: [facility] OR;  Service: General;  Laterality: Right;   IR RADIOLOGIST EVAL & MGMT  01/06/2021   NASAL POLYP EXCISION     OTHER SURGICAL HISTORY  06/05/2002   Penile Implant   ROBOTIC ASSITED PARTIAL NEPHRECTOMY Left 02/25/2021   Procedure: XI ROBOTIC ASSITED PARTIAL NEPHRECTOMY;  Surgeon: Rene Paci, MD;  Location: [facility] ORS;  Service: Urology;  Laterality: Left;   UPPER GI ENDOSCOPY      Allergies  Allergen Reactions   Lexapro [Escitalopram]     Confusion, weakness   Namenda [Memantine]     Insomnia   Lisinopril Other (See Comments)    Cough    Outpatient Encounter Medications as of 01/04/2023  Medication Sig   acetaminophen (TYLENOL) 500  MG tablet Take 1,000 mg by mouth every 8 (eight) hours as needed.   amLODipine (NORVASC) 10 MG tablet TAKE 1 TABLET BY MOUTH EVERY DAY   ammonium lactate (AMLACTIN) 12 % cream Apply 1 g topically as needed for dry skin.   atorvastatin (LIPITOR) 10 MG tablet Take 0.5 tablets (5 mg total) by mouth daily at 6 PM.   bisacodyl (DULCOLAX) 10 MG suppository Place 10 mg rectally as directed. Every 24 hours as needed for constipation   cetirizine (ZYRTEC) 5 MG tablet Take 5 mg by mouth daily.   docusate sodium (COLACE) 100 MG capsule Take 1 capsule (100 mg total) by mouth 2 (two) times daily.   feeding supplement (ENSURE ENLIVE / ENSURE PLUS) LIQD Take 237 mLs by mouth 3 (three) times daily between meals.   isosorbide  mononitrate (IMDUR) 60 MG 24 hr tablet Take 1 tablet (60 mg total) by mouth every evening. Skip the dose if systolic BP less than 140 mmHg   lidocaine (LIDODERM) 5 % Place 1 patch onto the skin daily. Remove & Discard patch within 12 hours or as directed by MD   losartan (COZAAR) 50 MG tablet TAKE 1 TABLET BY MOUTH EVERY DAY   melatonin 5 MG TABS Take 1 tablet (5 mg total) by mouth every evening.   Multiple Vitamin (MULTIVITAMIN) tablet Take 1 tablet by mouth daily.   mupirocin ointment (BACTROBAN) 2 % Apply 1 Application topically 2 (two) times daily.   nitroGLYCERIN (NITROSTAT) 0.4 MG SL tablet *PLACE 1 TABLET UNDER TONGUE EVERY 5 MINS., UP TO 3 DOSES AS NEEDED FOR CHEST PAIN*   ofloxacin (OCUFLOX) 0.3 % ophthalmic solution Place 1 drop into both eyes 4 (four) times daily. For 2 days Then one drop both eye four times daily for 5 days.   omeprazole (PRILOSEC) 20 MG capsule Take 1 capsule (20 mg total) by mouth 2 (two) times daily.   polyethylene glycol (MIRALAX / GLYCOLAX) 17 g packet Take 17 g by mouth daily as needed.   QUEtiapine (SEROQUEL) 25 MG tablet Take 12.5 mg by mouth at bedtime. Give 12.5mg  by mouth every 24 hours as needed for increased agitation.   tamsulosin (FLOMAX) 0.4 MG CAPS capsule Take 1 capsule (0.4 mg total) by mouth daily after supper.   Zinc Oxide (TRIPLE PASTE) 12.8 % ointment Apply 1 Application topically as needed for irritation.   No facility-administered encounter medications on file as of 01/04/2023.    Review of Systems  Unable to perform ROS: Dementia    Immunization History  Administered Date(s) Administered   PFIZER(Purple Top)SARS-COV-2 Vaccination 08/25/2019, 09/15/2019, 05/18/2020   Pneumococcal Conjugate-13 05/20/2014   Tdap 11/25/2021   Pertinent  Health Maintenance Due  Topic Date Due   INFLUENZA VACCINE  03/01/2023      06/01/2022    2:42 PM 06/08/2022    2:51 PM 08/31/2022    1:07 PM 09/12/2022    3:31 PM 10/13/2022   12:38 PM  Fall Risk   Falls in the past year? 1 1 0 1 1  Was there an injury with Fall? 1 0 0 0 0  Fall Risk Category Calculator 3 2 0 2 2  Fall Risk Category (Retired) High Moderate     (RETIRED) Patient Fall Risk Level High fall risk Moderate fall risk     Patient at Risk for Falls Due to History of fall(s)  History of fall(s)    Patient at Risk for Falls Due to - Comments   cane  Fall risk Follow up Falls evaluation completed Falls evaluation completed Falls prevention discussed Falls evaluation completed Falls evaluation completed   Functional Status Survey:    Vitals:   01/04/23 1411 01/04/23 1412  BP: (!) 186/93 (!) 144/70  Pulse: 65   Resp: 16   Temp: (!) 97.5 F (36.4 C)   SpO2: 96%   Weight: 155 lb 6.4 oz (70.5 kg)   Height: 5\' 10"  (1.778 m)    Body mass index is 22.3 kg/m. Physical Exam Constitutional:      Appearance: Normal appearance.  Eyes:     General:        Right eye: Discharge present.        Left eye: Discharge present.    Conjunctiva/sclera:     Right eye: Exudate present.     Left eye: Exudate present.  Neurological:     Mental Status: He is alert. Mental status is at baseline.  Psychiatric:        Mood and Affect: Mood normal.     Labs reviewed: Recent Labs    11/10/22 0336 11/11/22 0416 11/13/22 0600 11/23/22 0000  NA 140 137 141 148*  K 3.5 3.4* 3.8 4.0  CL 106 106 107 110*  CO2 25 22 26  28*  GLUCOSE 102* 114* 118*  --   BUN 23 25* 31* 62*  CREATININE 0.99 1.16 0.96 1.3  CALCIUM 9.0 8.8* 9.0 9.8  MG 2.0 2.0 2.2  --   PHOS 3.3 3.6 3.6  --    Recent Labs    03/06/22 1349 09/28/22 1756 11/09/22 0103 11/23/22 0000  AST 14 14* 18 83*  ALT 14 9 13  175*  ALKPHOS 57 55 59 95  BILITOT 0.4 0.6 0.8  --   PROT 6.5 6.6 6.3*  --   ALBUMIN 4.2 3.9 3.9 3.4*   Recent Labs    11/09/22 0103 11/10/22 0336 11/11/22 0416 11/13/22 0600 11/23/22 0000  WBC 10.1 7.9 9.4 7.8 20.2  NEUTROABS 7.4  --   --   --  14,483.00  HGB 11.2* 12.0* 10.8* 10.5* 12.4*   HCT 33.9* 35.1* 31.9* 31.1* 38*  MCV 101.5* 99.7 100.6* 100.3*  --   PLT 155 170 173 163 261   Lab Results  Component Value Date   TSH 2.78 03/06/2022   Lab Results  Component Value Date   HGBA1C 5.7 01/27/2022   Lab Results  Component Value Date   CHOL 112 11/24/2021   HDL 37 (L) 11/24/2021   LDLCALC 53 11/24/2021   TRIG 122 11/24/2021   CHOLHDL 3.0 11/24/2021    Significant Diagnostic Results in last 30 days:  No results found.  Assessment/Plan 1. Acute bacterial conjunctivitis of both eyes -to start ofloxin 2% drops bilaterally 1 drop q4 hour for 48 hours then 1 drop to bilateral eyes QID for 5 days    Anita Mcadory K. Biagio Borg [facility] [phone]

## 2023-01-09 ENCOUNTER — Non-Acute Institutional Stay: Payer: Medicare Other | Admitting: Hospice

## 2023-01-09 DIAGNOSIS — F03918 Unspecified dementia, unspecified severity, with other behavioral disturbance: Secondary | ICD-10-CM

## 2023-01-09 DIAGNOSIS — R5381 Other malaise: Secondary | ICD-10-CM

## 2023-01-09 DIAGNOSIS — N1831 Chronic kidney disease, stage 3a: Secondary | ICD-10-CM

## 2023-01-09 DIAGNOSIS — Z515 Encounter for palliative care: Secondary | ICD-10-CM

## 2023-01-09 DIAGNOSIS — R531 Weakness: Secondary | ICD-10-CM

## 2023-01-09 DIAGNOSIS — F03911 Unspecified dementia, unspecified severity, with agitation: Secondary | ICD-10-CM

## 2023-01-09 NOTE — Progress Notes (Signed)
Therapist, nutritional Palliative Care Consult Note Telephone: [phone]  Fax: [phone]  PATIENT NAME: Zachary Hall [address] [place] [address] [phone] (home)  DOB: 22-Jan-1935 MRN: [id number]  PRIMARY CARE PROVIDER:    Elenore Paddy, NP,  [address] [place] [address] [phone]  REFERRING PROVIDER:   Dr. Earnestine Mealing  RESPONSIBLE PARTY:    Contact Information     Name Relation Home Work Mobile   [name],[name] Spouse [phone]  [phone]        I met face to face with patient in the facility. Visit to build trust and highlight Palliative Medicine as specialized medical care for people living with serious illness, aimed at facilitating better quality of life through symptoms relief, assisting with advance care planning and complex medical decision making.  NP called Zachary Hall and could not leave a voicemail because mailbox was full.  Palliative care team will continue to support patient, patient's family, and medical team.  ASSESSMENT AND / RECOMMENDATIONS:   CODE STATUS: Patient is a DO NOT RESUSCITATE  Goals of Care: Goals include to maximize quality of life and symptom management.  Spouse seeking long-term care placement for patient.  Symptom Management/Plan: Debility: secondary to advanced demntia, hypertensive heart disease with worsening weakness,  history of renal cell carcinoma with partial left nephrectomy August 2022. Continue ongoing supportive care, reposition regularly regularly to help prevent skin breakdown and for comfort.   Dementia: Advanced dementia-max assist in ADLs, progressing memory loss/confusion, impoverished thought, limited language, nonambulatory, incontinent of bowel and bladder, FAST 7A.  Continue ongoing supportive care.  Fall/safety precautions.  Agitation: continue Seroquel, and Quitepine newly  started due to worsening agitation, end date 01/13/23. UA negative.     Use de-escalation techniques.  Psych consult as needed.   Hypertensive heart disease:Continue Losartan, Amlodipine, Isosorbide as ordered. Follow up with Cardiologst as planned.  Routine CBC CMP.  Weakness.  Worsening, currently nonambulatory. PT/OT is ongoing for strengthning and mobility, max assist in ADLs, feeds self after set sometimes.   Follow up: Palliative care will continue to follow for complex medical decision making, advance care planning, and clarification of goals. Return 4 weeks or prn. Encouraged to call provider sooner with any concerns.   Family /Caregiver/Community Supports: Patient in SNF for acute rehab.  HOSPICE ELIGIBILITY/DIAGNOSIS: TBD  Chief Complaint:  Follow-up visit  HISTORY OF PRESENT ILLNESS:  Zachary Hall is a 88 y.o. year old male  with multiple morbidities requiring close monitoring/management, and with high risk of complications and  mortality: Advanced dementia, agitation, generalized weakness, debility.  History of renal cell carcinoma with partial left nephrectomy August 2022.  Patient denied pain/discomfort, incomprehenisble jabbering not able to engage in meaning discussion. FLACC 0. History obtained from review of EMR, discussion with primary team, caregiver, family and/or Zachary Hall.  Review and summarization of Epic records shows history from other than patient. Rest of 10 point ROS asked and negative. Independent interpretation of tests and reviewed as needed, available labs, patient records, imaging, studies and related documents from the EMR.  PHYSICAL EXAM: GEN: in no acute distress , hard of hearing Cardiac: S1 S2, no LE edema feet/ankles  Respiratory:  clear to auscultation bilaterally, GI: soft, nontender,  + BS   MS: Nonambulatory  Neuro: Generalized weakness, nonfocal, alert and oriented x 1 Psych: non-anxious affect   PAST MEDICAL HISTORY:  Active Ambulatory Problems    Diagnosis Date Noted   Coronary artery disease of  bypass graft of native heart with stable angina pectoris (HCC)    History of atrial fibrillation    Sinus bradycardia with RBBB 11/21/2010   RBBB (right bundle branch block) 09/02/2013   H/O coronary artery bypass surgery 12/15/2010   Arteriosclerosis of coronary artery 05/04/2014   Essential hypertension 09/16/2015   Hyperlipidemia 09/16/2015   Right inguinal hernia 01/21/2017   Right epiretinal membrane 02/10/2020   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   Pseudophakia of both eyes 02/10/2020   Posterior vitreous detachment of right eye 02/10/2020   Renal mass 02/25/2021   Presence of aortocoronary bypass graft 12/15/2010   Stasis dermatitis 05/14/2014   History of partial nephrectomy 08/21/2021   Type A blood, Rh positive 08/21/2021   Hyponatremia 08/21/2021   IFG (impaired fasting glucose) 08/21/2021   Low hemoglobin 08/21/2021   Macular pucker, left eye 08/23/2021   Balance problem 01/27/2022   Hyperglycemia 01/27/2022   Dementia (HCC) 01/27/2022   Unintentional weight loss 02/23/2022   Fatigue 02/23/2022   Thrombocytopenia (HCC) 04/13/2022   Fall 04/13/2022   Anemia 05/11/2022   CKD (chronic kidney disease) stage 3, GFR 30-59 ml/min (HCC) 06/01/2022   Constipation 06/01/2022   History of renal cell carcinoma s/p partial left nephrectomy 01/2021 08/31/2022   Knee joint stiffness, bilateral 08/31/2022   DDD (degenerative disc disease), cervical 10/20/2022   Syncope and collapse 11/09/2022   Hypertensive urgency 11/09/2022   Protein-calorie malnutrition, severe (HCC) 11/13/2022   Malignant neoplasm of left kidney, except renal pelvis (HCC) 11/15/2022   DNR (do not resuscitate) 12/31/2022   Resolved Ambulatory Problems    Diagnosis Date Noted   Hypertension    Hypercholesterolemia    History of chest pain    Arm pain 11/21/2010   Hx of CABG 12/15/2010   Hemorrhoids, internal, with bleeding 07/08/2012   Prolapsed internal hemorrhoids 09/10/2012   Bifascicular block  09/02/2013   H/O disease 05/04/2014   Memory disorder 05/31/2015   Memory loss 09/16/2015   Mild cognitive impairment 09/29/2016   Atopic dermatitis 05/18/2015   Dementia due to Alzheimer's disease (HCC) 09/07/2021   Past Medical History:  Diagnosis Date   Arthritis    Barrett esophagus    Bradycardia    Coronary artery disease    GERD (gastroesophageal reflux disease)    Gout    Left renal mass    Melanoma (HCC)    Nocturia     SOCIAL HX:  Social History   Tobacco Use   Smoking status: Former    Types: Cigarettes    Quit date: 01/07/1961    Years since quitting: 62.0   Smokeless tobacco: Former  Substance Use Topics   Alcohol use: Yes    Alcohol/week: 4.0 standard drinks of alcohol    Types: 4 Cans of beer per week     FAMILY HX:  Family History  Problem Relation Age of Onset   Alcohol abuse Father    Colon cancer Mother    Cancer Mother        colon and bone   Heart attack Neg Hx    Hypertension Neg Hx    Stroke Neg Hx       ALLERGIES:  Allergies  Allergen Reactions   Lexapro [Escitalopram]     Confusion, weakness   Namenda [Memantine]     Insomnia   Lisinopril Other (See Comments)    Cough      PERTINENT MEDICATIONS:  Outpatient Encounter Medications as of 01/09/2023  Medication Sig   acetaminophen (  TYLENOL) 500 MG tablet Take 1,000 mg by mouth every 8 (eight) hours as needed.   amLODipine (NORVASC) 10 MG tablet TAKE 1 TABLET BY MOUTH EVERY DAY   ammonium lactate (AMLACTIN) 12 % cream Apply 1 g topically as needed for dry skin.   atorvastatin (LIPITOR) 10 MG tablet Take 0.5 tablets (5 mg total) by mouth daily at 6 PM.   bisacodyl (DULCOLAX) 10 MG suppository Place 10 mg rectally as directed. Every 24 hours as needed for constipation   cetirizine (ZYRTEC) 5 MG tablet Take 5 mg by mouth daily.   docusate sodium (COLACE) 100 MG capsule Take 1 capsule (100 mg total) by mouth 2 (two) times daily.   feeding supplement (ENSURE ENLIVE / ENSURE PLUS) LIQD  Take 237 mLs by mouth 3 (three) times daily between meals.   isosorbide mononitrate (IMDUR) 60 MG 24 hr tablet Take 1 tablet (60 mg total) by mouth every evening. Skip the dose if systolic BP less than 140 mmHg   lidocaine (LIDODERM) 5 % Place 1 patch onto the skin daily. Remove & Discard patch within 12 hours or as directed by MD   losartan (COZAAR) 50 MG tablet TAKE 1 TABLET BY MOUTH EVERY DAY   melatonin 5 MG TABS Take 1 tablet (5 mg total) by mouth every evening.   Multiple Vitamin (MULTIVITAMIN) tablet Take 1 tablet by mouth daily.   mupirocin ointment (BACTROBAN) 2 % Apply 1 Application topically 2 (two) times daily.   nitroGLYCERIN (NITROSTAT) 0.4 MG SL tablet *PLACE 1 TABLET UNDER TONGUE EVERY 5 MINS., UP TO 3 DOSES AS NEEDED FOR CHEST PAIN*   ofloxacin (OCUFLOX) 0.3 % ophthalmic solution Place 1 drop into both eyes 4 (four) times daily. For 2 days Then one drop both eye four times daily for 5 days.   omeprazole (PRILOSEC) 20 MG capsule Take 1 capsule (20 mg total) by mouth 2 (two) times daily.   polyethylene glycol (MIRALAX / GLYCOLAX) 17 g packet Take 17 g by mouth daily as needed.   QUEtiapine (SEROQUEL) 25 MG tablet Take 12.5 mg by mouth at bedtime. Give 12.5mg  by mouth every 24 hours as needed for increased agitation.   tamsulosin (FLOMAX) 0.4 MG CAPS capsule Take 1 capsule (0.4 mg total) by mouth daily after supper.   Zinc Oxide (TRIPLE PASTE) 12.8 % ointment Apply 1 Application topically as needed for irritation.   No facility-administered encounter medications on file as of 01/09/2023.   I spent 35 minutes providing this consultation; this includes time spent with patient/family, chart review and documentation. More than 50% of the time in this consultation was spent on counseling and coordinating communication.  Thank you for the opportunity to participate in the care of Zachary Hall.  The palliative care team will continue to follow. Please call our office at [phone] if we can  be of additional assistance.   Note: Portions of this note were generated with Scientist, clinical (histocompatibility and immunogenetics). Dictation errors may occur despite best attempts at proofreading.  Rosaura Carpenter, NP

## 2023-01-16 ENCOUNTER — Non-Acute Institutional Stay (SKILLED_NURSING_FACILITY): Payer: Medicare Other | Admitting: Nurse Practitioner

## 2023-01-16 ENCOUNTER — Encounter: Payer: Self-pay | Admitting: Nurse Practitioner

## 2023-01-16 DIAGNOSIS — N183 Chronic kidney disease, stage 3 unspecified: Secondary | ICD-10-CM | POA: Diagnosis not present

## 2023-01-16 DIAGNOSIS — I25708 Atherosclerosis of coronary artery bypass graft(s), unspecified, with other forms of angina pectoris: Secondary | ICD-10-CM | POA: Diagnosis not present

## 2023-01-16 DIAGNOSIS — F02C11 Dementia in other diseases classified elsewhere, severe, with agitation: Secondary | ICD-10-CM

## 2023-01-16 DIAGNOSIS — I10 Essential (primary) hypertension: Secondary | ICD-10-CM | POA: Diagnosis not present

## 2023-01-16 DIAGNOSIS — N400 Enlarged prostate without lower urinary tract symptoms: Secondary | ICD-10-CM

## 2023-01-16 DIAGNOSIS — K219 Gastro-esophageal reflux disease without esophagitis: Secondary | ICD-10-CM | POA: Diagnosis not present

## 2023-01-16 DIAGNOSIS — G301 Alzheimer's disease with late onset: Secondary | ICD-10-CM

## 2023-01-16 NOTE — Progress Notes (Signed)
Location:  Other [facility].  Nursing Home Room Number: [facility] 103A Place of Service:  SNF (31) [name], NP  PCP: Elenore Paddy, NP  Patient Care Team: Elenore Paddy, NP as PCP - General (Nurse Practitioner) [place], Peter M, MD as PCP - Cardiology (Cardiology)  Extended Emergency Contact Information Primary Emergency Contact: [facility] Address: [address]          [place], [place] [address] Darden Amber of [place] Home Phone: [phone] Mobile Phone: [phone] Relation: Spouse  Goals of care: Advanced Directive information    01/16/2023   12:58 PM  Advanced Directives  Does Patient Have a Medical Advance Directive? Yes  Type of Advance Directive Out of facility DNR (pink MOST or yellow form);Living will  Does patient want to make changes to medical advance directive? No - Patient declined  Copy of Healthcare Power of Attorney in Chart? Yes - validated most recent copy scanned in chart (See row information)     Chief Complaint  Patient presents with   Medical Management of Chronic Issues    Medical Management of Chronic Issues    HPI:  Pt is a 88 y.o. male seen today for medical management of chronic disease. Pt with hx of dementia, arthritis, CAD, GERD, BPH, hx of renal cell carcinoma with partial nephrectomy, htn.  Plans to be long term care at [facility] due to dementia.    Past Medical History:  Diagnosis Date   Arthritis    Balance problem    Barrett esophagus    Bradycardia    Coronary artery disease    GERD (gastroesophageal reflux disease)    Gout    History of atrial fibrillation    Previously on amiodarone   Hx of CABG    Hypercholesterolemia    Hypertension    Left renal mass    Melanoma (HCC)    Left shoulder blade   Nocturia    Right epiretinal membrane 02/10/2020   Past Surgical History:  Procedure Laterality Date   CARDIAC CATHETERIZATION  01/04/2010   COLONOSCOPY     CORONARY ARTERY BYPASS GRAFT   02/07/2010   LIMA to LAD, SVG to 2nd DX, SVG to OM   EYE SURGERY Bilateral    Cataract Extraction   HEMORRHOID SURGERY     x 2   HEMORRHOID SURGERY N/A 11/13/2012   Procedure: PPH Hemorrhoidectomy;  Surgeon: Robyne Askew, MD;  Location: [facility] OR;  Service: General;  Laterality: N/A;   INGUINAL HERNIA REPAIR Right 01/22/2017   Procedure: RIGHT INGUINAL HERNIA REPAIR WITH MESH;  Surgeon: Darnell Level, MD;  Location: [facility] OR;  Service: General;  Laterality: Right;   INSERTION OF MESH Right 01/22/2017   Procedure: INSERTION OF MESH;  Surgeon: Darnell Level, MD;  Location: [facility] OR;  Service: General;  Laterality: Right;   IR RADIOLOGIST EVAL & MGMT  01/06/2021   NASAL POLYP EXCISION     OTHER SURGICAL HISTORY  06/05/2002   Penile Implant   ROBOTIC ASSITED PARTIAL NEPHRECTOMY Left 02/25/2021   Procedure: XI ROBOTIC ASSITED PARTIAL NEPHRECTOMY;  Surgeon: Rene Paci, MD;  Location: [facility] ORS;  Service: Urology;  Laterality: Left;   UPPER GI ENDOSCOPY      Allergies  Allergen Reactions   Lexapro [Escitalopram]     Confusion, weakness   Namenda [Memantine]     Insomnia   Lisinopril Other (See Comments)    Cough    Outpatient Encounter Medications as of 01/16/2023  Medication Sig  acetaminophen (TYLENOL) 500 MG tablet Take 1,000 mg by mouth every 8 (eight) hours as needed.   amLODipine (NORVASC) 10 MG tablet TAKE 1 TABLET BY MOUTH EVERY DAY   ammonium lactate (AMLACTIN) 12 % cream Apply 1 g topically as needed for dry skin.   atorvastatin (LIPITOR) 10 MG tablet Take 0.5 tablets (5 mg total) by mouth daily at 6 PM.   bisacodyl (DULCOLAX) 10 MG suppository Place 10 mg rectally as directed. Every 24 hours as needed for constipation   cetirizine (ZYRTEC) 5 MG tablet Take 5 mg by mouth daily.   docusate sodium (COLACE) 100 MG capsule Take 1 capsule (100 mg total) by mouth 2 (two) times daily.   feeding supplement (ENSURE ENLIVE / ENSURE PLUS) LIQD Take 237 mLs by mouth 3 (three) times  daily between meals.   isosorbide mononitrate (IMDUR) 60 MG 24 hr tablet Take 1 tablet (60 mg total) by mouth every evening. Skip the dose if systolic BP less than 140 mmHg   lidocaine (LIDODERM) 5 % Place 1 patch onto the skin daily. Remove & Discard patch within 12 hours or as directed by MD   losartan (COZAAR) 50 MG tablet TAKE 1 TABLET BY MOUTH EVERY DAY   melatonin 5 MG TABS Take 1 tablet (5 mg total) by mouth every evening.   Multiple Vitamin (MULTIVITAMIN) tablet Take 1 tablet by mouth daily.   mupirocin ointment (BACTROBAN) 2 % Apply 1 Application topically 2 (two) times daily.   nitroGLYCERIN (NITROSTAT) 0.4 MG SL tablet *PLACE 1 TABLET UNDER TONGUE EVERY 5 MINS., UP TO 3 DOSES AS NEEDED FOR CHEST PAIN*   omeprazole (PRILOSEC) 20 MG capsule Take 1 capsule (20 mg total) by mouth 2 (two) times daily.   polyethylene glycol (MIRALAX / GLYCOLAX) 17 g packet Take 17 g by mouth daily as needed.   QUEtiapine (SEROQUEL) 25 MG tablet Take 12.5 mg by mouth at bedtime.   tamsulosin (FLOMAX) 0.4 MG CAPS capsule Take 1 capsule (0.4 mg total) by mouth daily after supper.   Zinc Oxide (TRIPLE PASTE) 12.8 % ointment Apply 1 Application topically as needed for irritation.   [DISCONTINUED] ofloxacin (OCUFLOX) 0.3 % ophthalmic solution Place 1 drop into both eyes 4 (four) times daily. For 2 days Then one drop both eye four times daily for 5 days.   No facility-administered encounter medications on file as of 01/16/2023.    Review of Systems  Unable to perform ROS: Dementia     Immunization History  Administered Date(s) Administered   PFIZER(Purple Top)SARS-COV-2 Vaccination 08/25/2019, 09/15/2019, 05/18/2020   Pneumococcal Conjugate-13 05/20/2014   Tdap 11/25/2021   Pertinent  Health Maintenance Due  Topic Date Due   INFLUENZA VACCINE  03/01/2023      06/01/2022    2:42 PM 06/08/2022    2:51 PM 08/31/2022    1:07 PM 09/12/2022    3:31 PM 10/13/2022   12:38 PM  Fall Risk  Falls in the past year?  1 1 0 1 1  Was there an injury with Fall? 1 0 0 0 0  Fall Risk Category Calculator 3 2 0 2 2  Fall Risk Category (Retired) High Moderate     (RETIRED) Patient Fall Risk Level High fall risk Moderate fall risk     Patient at Risk for Falls Due to History of fall(s)  History of fall(s)    Patient at Risk for Falls Due to - Comments   cane    Fall risk Follow up Falls evaluation  completed Falls evaluation completed Falls prevention discussed Falls evaluation completed Falls evaluation completed   Functional Status Survey:    Vitals:   01/16/23 1251 01/16/23 1259  BP: (!) 143/72 134/64  Pulse: 65   Resp: 16   Temp: (!) 97.5 F (36.4 C)   SpO2: 90%   Weight: 153 lb 3.2 oz (69.5 kg)   Height: 5\' 10"  (1.778 m)    Body mass index is 21.98 kg/m. Physical Exam Constitutional:      General: He is not in acute distress.    Appearance: He is well-developed. He is not diaphoretic.  HENT:     Head: Normocephalic and atraumatic.     Right Ear: External ear normal.     Left Ear: External ear normal.     Mouth/Throat:     Pharynx: No oropharyngeal exudate.  Eyes:     Conjunctiva/sclera: Conjunctivae normal.     Pupils: Pupils are equal, round, and reactive to light.  Cardiovascular:     Rate and Rhythm: Normal rate and regular rhythm.     Heart sounds: Normal heart sounds.  Pulmonary:     Effort: Pulmonary effort is normal.     Breath sounds: Normal breath sounds.  Abdominal:     General: Bowel sounds are normal.     Palpations: Abdomen is soft.  Musculoskeletal:        General: No tenderness.     Cervical back: Normal range of motion and neck supple.     Right lower leg: No edema.     Left lower leg: No edema.  Skin:    General: Skin is warm and dry.  Neurological:     Mental Status: He is alert and oriented to person, place, and time. Mental status is at baseline.     Motor: Weakness present.     Gait: Gait abnormal.     Labs reviewed: Recent Labs    11/10/22 0336  11/11/22 0416 11/13/22 0600 11/23/22 0000  NA 140 137 141 148*  K 3.5 3.4* 3.8 4.0  CL 106 106 107 110*  CO2 25 22 26  28*  GLUCOSE 102* 114* 118*  --   BUN 23 25* 31* 62*  CREATININE 0.99 1.16 0.96 1.3  CALCIUM 9.0 8.8* 9.0 9.8  MG 2.0 2.0 2.2  --   PHOS 3.3 3.6 3.6  --    Recent Labs    03/06/22 1349 09/28/22 1756 11/09/22 0103 11/23/22 0000  AST 14 14* 18 83*  ALT 14 9 13  175*  ALKPHOS 57 55 59 95  BILITOT 0.4 0.6 0.8  --   PROT 6.5 6.6 6.3*  --   ALBUMIN 4.2 3.9 3.9 3.4*   Recent Labs    11/09/22 0103 11/10/22 0336 11/11/22 0416 11/13/22 0600 11/23/22 0000  WBC 10.1 7.9 9.4 7.8 20.2  NEUTROABS 7.4  --   --   --  14,483.00  HGB 11.2* 12.0* 10.8* 10.5* 12.4*  HCT 33.9* 35.1* 31.9* 31.1* 38*  MCV 101.5* 99.7 100.6* 100.3*  --   PLT 155 170 173 163 261   Lab Results  Component Value Date   TSH 2.78 03/06/2022   Lab Results  Component Value Date   HGBA1C 5.7 01/27/2022   Lab Results  Component Value Date   CHOL 112 11/24/2021   HDL 37 (L) 11/24/2021   LDLCALC 53 11/24/2021   TRIG 122 11/24/2021   CHOLHDL 3.0 11/24/2021    Significant Diagnostic Results in last 30 days:  No results  found.  Assessment/Plan 1. Gastroesophageal reflux disease, unspecified whether esophagitis present -without symptoms at this time, continues on omeprazole  2. 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 Needing care with all ADLs   3. Stage 3 chronic kidney disease, unspecified whether stage 3a or 3b CKD (HCC) -Chronic and stable Encourage proper hydration Follow metabolic panel Avoid nephrotoxic meds (NSAIDS)  4. Essential hypertension -Blood pressure well controlled, goal bp <140/90 Continue current medications and dietary modifications follow metabolic panel  5. Coronary artery disease of bypass graft of native heart with stable angina  pectoris (HCC) Stable, without complaints of chest pains  6. Benign prostatic hyperplasia, unspecified whether lower urinary tract symptoms present Continues on flomax    Britton Bera K. [facility]ag[facility] [phone]

## 2023-01-18 ENCOUNTER — Telehealth: Payer: Self-pay | Admitting: Cardiology

## 2023-01-18 NOTE — Telephone Encounter (Signed)
Pt's wife would like a call back on the upcoming appointment on 6/25 being that pt is at a skilled facility. She would like to know if there is an option for a telephone visit. Please advise.

## 2023-01-18 NOTE — Telephone Encounter (Signed)
Spoke with wife and she ask if it is possible to do a video call with patient for his appt 6/25.  He is now in SNF.  He was there for rehab but will be unable to go home due to non ambulatory, incontinence, and behaviors.  He is in memory care and discussing Palliative care with wife.  She states it would be too much to bring him into an appt.  He is not having any cardiac issues at this time.  He has been seen in facility by NP.  If needs to be seen she ask can the video be done that day.  She has an appt at 1:30, his is scheduled for 2:30 and she states staff could possibly be available to do the chat if she is still in our office at that time.  Please advise.

## 2023-01-22 NOTE — Telephone Encounter (Addendum)
Spoke to patient's wife.She stated husband is now incontinent.He lives a [employer] home.Stated it is hard for him to come to office.Dr.Jordan advised if he was doing ok cardiac wise he can cancel appointment 6/25.He will talk to you about his condition at your visit on 6/25.Stated his heart seems to be doing ok.She will discuss his condition at her visit tomorrow.

## 2023-01-23 ENCOUNTER — Ambulatory Visit: Payer: Medicare Other | Admitting: Cardiology

## 2023-02-03 DIAGNOSIS — I480 Paroxysmal atrial fibrillation: Secondary | ICD-10-CM | POA: Diagnosis not present

## 2023-02-03 DIAGNOSIS — Z9181 History of falling: Secondary | ICD-10-CM | POA: Diagnosis not present

## 2023-02-03 DIAGNOSIS — M6281 Muscle weakness (generalized): Secondary | ICD-10-CM | POA: Diagnosis not present

## 2023-02-03 DIAGNOSIS — F02811 Dementia in other diseases classified elsewhere, unspecified severity, with agitation: Secondary | ICD-10-CM | POA: Diagnosis not present

## 2023-02-03 DIAGNOSIS — R55 Syncope and collapse: Secondary | ICD-10-CM | POA: Diagnosis not present

## 2023-02-06 DIAGNOSIS — Z515 Encounter for palliative care: Secondary | ICD-10-CM | POA: Diagnosis not present

## 2023-02-19 ENCOUNTER — Encounter: Payer: Self-pay | Admitting: Student

## 2023-02-19 ENCOUNTER — Non-Acute Institutional Stay (SKILLED_NURSING_FACILITY): Payer: Medicare Other | Admitting: Student

## 2023-02-19 DIAGNOSIS — E43 Unspecified severe protein-calorie malnutrition: Secondary | ICD-10-CM

## 2023-02-19 DIAGNOSIS — Z66 Do not resuscitate: Secondary | ICD-10-CM

## 2023-02-19 DIAGNOSIS — F02C11 Dementia in other diseases classified elsewhere, severe, with agitation: Secondary | ICD-10-CM

## 2023-02-19 DIAGNOSIS — G301 Alzheimer's disease with late onset: Secondary | ICD-10-CM

## 2023-02-19 DIAGNOSIS — K219 Gastro-esophageal reflux disease without esophagitis: Secondary | ICD-10-CM

## 2023-02-19 DIAGNOSIS — Z951 Presence of aortocoronary bypass graft: Secondary | ICD-10-CM | POA: Diagnosis not present

## 2023-02-19 DIAGNOSIS — N183 Chronic kidney disease, stage 3 unspecified: Secondary | ICD-10-CM | POA: Diagnosis not present

## 2023-02-19 DIAGNOSIS — I10 Essential (primary) hypertension: Secondary | ICD-10-CM | POA: Diagnosis not present

## 2023-02-19 NOTE — Progress Notes (Unsigned)
Location:   [facility]  Nursing Home Room Number: 103 A Place of Service:  SNF [phone]) Provider:  Earnestine Mealing, MD  Elenore Paddy, NP  Patient Care Team: Elenore Paddy, NP as PCP - General (Nurse Practitioner) [place], Peter M, MD as PCP - Cardiology (Cardiology)  Extended Emergency Contact Information Primary Emergency Contact: [name],[name] Address: [address]          [place], [place] [address] Darden Amber of [place] Home Phone: [phone] Mobile Phone: [phone] Relation: Spouse  Code Status:  DNR Goals of care: Advanced Directive information    02/19/2023    9:13 AM  Advanced Directives  Does Patient Have a Medical Advance Directive? Yes  Type of Estate agent of [place];Living will;Out of facility DNR (pink MOST or yellow form)  Does patient want to make changes to medical advance directive? No - Patient declined  Copy of Healthcare Power of Attorney in Chart? Yes - validated most recent copy scanned in chart (See row information)  Pre-existing out of facility DNR order (yellow form or pink MOST form) Yellow form placed in chart (order not valid for inpatient use)     Chief Complaint  Patient presents with   Medical Management of Chronic Issues    Routine follow up   Immunizations    Shingles vaccine due    HPI:  Pt is a 88 y.o. male seen today for medical management of chronic diseases.    Patient is alert and saying nonsensical phrases Nursing without concerns at this time. He needs PRN seroquel 2-3x per week. Eating well. Feeds himself. Otherwise non abmulatory and total care.   Past Medical History:  Diagnosis Date   Arthritis    Balance problem    Barrett esophagus    Bradycardia    Coronary artery disease    GERD (gastroesophageal reflux disease)    Gout    History of atrial fibrillation    Previously on amiodarone   Hx of CABG    Hypercholesterolemia    Hypertension    Left renal  mass    Melanoma (HCC)    Left shoulder blade   Nocturia    Right epiretinal membrane 02/10/2020   Past Surgical History:  Procedure Laterality Date   CARDIAC CATHETERIZATION  01/04/2010   COLONOSCOPY     CORONARY ARTERY BYPASS GRAFT  02/07/2010   LIMA to LAD, SVG to 2nd DX, SVG to OM   EYE SURGERY Bilateral    Cataract Extraction   HEMORRHOID SURGERY     x 2   HEMORRHOID SURGERY N/A 11/13/2012   Procedure: PPH Hemorrhoidectomy;  Surgeon: Robyne Askew, MD;  Location: [facility] OR;  Service: General;  Laterality: N/A;   INGUINAL HERNIA REPAIR Right 01/22/2017   Procedure: RIGHT INGUINAL HERNIA REPAIR WITH MESH;  Surgeon: Darnell Level, MD;  Location: [facility] OR;  Service: General;  Laterality: Right;   INSERTION OF MESH Right 01/22/2017   Procedure: INSERTION OF MESH;  Surgeon: Darnell Level, MD;  Location: [facility] OR;  Service: General;  Laterality: Right;   IR RADIOLOGIST EVAL & MGMT  01/06/2021   NASAL POLYP EXCISION     OTHER SURGICAL HISTORY  06/05/2002   Penile Implant   ROBOTIC ASSITED PARTIAL NEPHRECTOMY Left 02/25/2021   Procedure: XI ROBOTIC ASSITED PARTIAL NEPHRECTOMY;  Surgeon: Rene Paci, MD;  Location: [facility] ORS;  Service: Urology;  Laterality: Left;   UPPER GI ENDOSCOPY      Allergies  Allergen  Reactions   Lexapro [Escitalopram]     Confusion, weakness   Namenda [Memantine]     Insomnia   Lisinopril Other (See Comments)    Cough    Allergies as of 02/19/2023       Reactions   Lexapro [escitalopram]    Confusion, weakness   Namenda [memantine]    Insomnia   Lisinopril Other (See Comments)   Cough        Medication List        Accurate as of February 19, 2023 11:59 PM. If you have any questions, ask your nurse or doctor.          acetaminophen 500 MG tablet Commonly known as: TYLENOL Take 1,000 mg by mouth every 8 (eight) hours as needed.   amLODipine 10 MG tablet Commonly known as: NORVASC TAKE 1 TABLET BY MOUTH EVERY DAY   ammonium lactate 12  % cream Commonly known as: AMLACTIN Apply 1 g topically as needed for dry skin.   atorvastatin 10 MG tablet Commonly known as: LIPITOR Take 0.5 tablets (5 mg total) by mouth daily at 6 PM.   bisacodyl 10 MG suppository Commonly known as: DULCOLAX Place 10 mg rectally as directed. Every 24 hours as needed for constipation   cetirizine 5 MG tablet Commonly known as: ZYRTEC Take 5 mg by mouth daily.   docusate sodium 100 MG capsule Commonly known as: COLACE Take 1 capsule (100 mg total) by mouth 2 (two) times daily.   feeding supplement Liqd Take 237 mLs by mouth 3 (three) times daily between meals.   isosorbide mononitrate 60 MG 24 hr tablet Commonly known as: IMDUR Take 1 tablet (60 mg total) by mouth every evening. Skip the dose if systolic BP less than 140 mmHg   lidocaine 5 % Commonly known as: LIDODERM Place 1 patch onto the skin daily. Remove & Discard patch within 12 hours or as directed by MD   losartan 50 MG tablet Commonly known as: COZAAR TAKE 1 TABLET BY MOUTH EVERY DAY   melatonin 5 MG Tabs Take 1 tablet (5 mg total) by mouth every evening.   multivitamin tablet Take 1 tablet by mouth daily.   mupirocin ointment 2 % Commonly known as: BACTROBAN Apply 1 Application topically 2 (two) times daily.   nitroGLYCERIN 0.4 MG SL tablet Commonly known as: NITROSTAT *PLACE 1 TABLET UNDER TONGUE EVERY 5 MINS., UP TO 3 DOSES AS NEEDED FOR CHEST PAIN*   omeprazole 20 MG capsule Commonly known as: PRILOSEC Take 1 capsule (20 mg total) by mouth 2 (two) times daily.   polyethylene glycol 17 g packet Commonly known as: MIRALAX / GLYCOLAX Take 17 g by mouth daily as needed.   QUEtiapine 25 MG tablet Commonly known as: SEROQUEL Take 12.5 mg by mouth at bedtime.   tamsulosin 0.4 MG Caps capsule Commonly known as: FLOMAX Take 1 capsule (0.4 mg total) by mouth daily after supper.   Zinc Oxide 12.8 % ointment Commonly known as: TRIPLE PASTE Apply 1 Application  topically as needed for irritation.        Review of Systems  Immunization History  Administered Date(s) Administered   PFIZER(Purple Top)SARS-COV-2 Vaccination 08/25/2019, 09/15/2019, 05/18/2020   Pneumococcal Conjugate-13 05/20/2014   Tdap 11/25/2021   Pertinent  Health Maintenance Due  Topic Date Due   INFLUENZA VACCINE  03/01/2023      06/01/2022    2:42 PM 06/08/2022    2:51 PM 08/31/2022    1:07 PM 09/12/2022  3:31 PM 10/13/2022   12:38 PM  Fall Risk  Falls in the past year? 1 1 0 1 1  Was there an injury with Fall? 1 0 0 0 0  Fall Risk Category Calculator 3 2 0 2 2  Fall Risk Category (Retired) High Moderate     (RETIRED) Patient Fall Risk Level High fall risk Moderate fall risk     Patient at Risk for Falls Due to History of fall(s)  History of fall(s)    Patient at Risk for Falls Due to - Comments   cane    Fall risk Follow up Falls evaluation completed Falls evaluation completed Falls prevention discussed Falls evaluation completed Falls evaluation completed   Functional Status Survey:    Vitals:   02/19/23 0910  BP: (!) 165/83  Pulse: 72  Resp: 18  Temp: 98.2 F (36.8 C)  SpO2: 97%  Weight: 156 lb 6.4 oz (70.9 kg)  Height: 5\' 10"  (1.778 m)   Body mass index is 22.44 kg/m. Physical Exam Constitutional:      Appearance: Normal appearance.  Cardiovascular:     Rate and Rhythm: Normal rate and regular rhythm.  Pulmonary:     Effort: Pulmonary effort is normal.  Abdominal:     General: Abdomen is flat.     Palpations: Abdomen is soft.  Skin:    General: Skin is warm and dry.  Neurological:     Mental Status: He is alert. Mental status is at baseline. He is disoriented.     Labs reviewed: Recent Labs    11/10/22 0336 11/11/22 0416 11/13/22 0600 11/23/22 0000  NA 140 137 141 148*  K 3.5 3.4* 3.8 4.0  CL 106 106 107 110*  CO2 25 22 26  28*  GLUCOSE 102* 114* 118*  --   BUN 23 25* 31* 62*  CREATININE 0.99 1.16 0.96 1.3  CALCIUM 9.0 8.8*  9.0 9.8  MG 2.0 2.0 2.2  --   PHOS 3.3 3.6 3.6  --    Recent Labs    03/06/22 1349 09/28/22 1756 11/09/22 0103 11/23/22 0000  AST 14 14* 18 83*  ALT 14 9 13  175*  ALKPHOS 57 55 59 95  BILITOT 0.4 0.6 0.8  --   PROT 6.5 6.6 6.3*  --   ALBUMIN 4.2 3.9 3.9 3.4*   Recent Labs    11/09/22 0103 11/10/22 0336 11/11/22 0416 11/13/22 0600 11/23/22 0000  WBC 10.1 7.9 9.4 7.8 20.2  NEUTROABS 7.4  --   --   --  14,483.00  HGB 11.2* 12.0* 10.8* 10.5* 12.4*  HCT 33.9* 35.1* 31.9* 31.1* 38*  MCV 101.5* 99.7 100.6* 100.3*  --   PLT 155 170 173 163 261   Lab Results  Component Value Date   TSH 2.78 03/06/2022   Lab Results  Component Value Date   HGBA1C 5.7 01/27/2022   Lab Results  Component Value Date   CHOL 112 11/24/2021   HDL 37 (L) 11/24/2021   LDLCALC 53 11/24/2021   TRIG 122 11/24/2021   CHOLHDL 3.0 11/24/2021    Significant Diagnostic Results in last 30 days:  No results found.  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 Eating well. Weight stable. Delusions and unable to redirect at  times, prn seroquel. BP has been elevated on multiple occasions. Takes Amlodipine and losartan, will increase losartan to 100 mg daily. BMP in 2 weeks. Continue Imdur 60 daily. HX of MI, difficult ot assess chest pain given baseline confusion, however, he does not endorse pain a this time. Nitroglycerin available. Continue Omeprazole for GERD.   Family/ staff Communication: Nursing  Labs/tests ordered:  BMP 2 weeks.

## 2023-02-21 ENCOUNTER — Encounter: Payer: Self-pay | Admitting: Student

## 2023-03-01 ENCOUNTER — Non-Acute Institutional Stay (SKILLED_NURSING_FACILITY): Payer: Medicare Other | Admitting: Nurse Practitioner

## 2023-03-01 ENCOUNTER — Encounter: Payer: Self-pay | Admitting: Nurse Practitioner

## 2023-03-01 DIAGNOSIS — H1033 Unspecified acute conjunctivitis, bilateral: Secondary | ICD-10-CM

## 2023-03-01 NOTE — Progress Notes (Signed)
Location:  Other [place].  Nursing Home Room Number: [facility] 103A Place of Service:  SNF (31) [name], NP  WUJ:WJXB, [place] Scrape, NP  Patient Care Team: Elenore Paddy, NP as PCP - General (Nurse Practitioner) [place], Peter M, MD as PCP - Cardiology (Cardiology)  Extended Emergency Contact Information Primary Emergency Contact: [facility] Address: [address]          [place], [place] [address] Darden Amber of [place] Home Phone: [phone] Mobile Phone: [phone] Relation: Spouse  Goals of care: Advanced Directive information    03/01/2023    2:58 PM  Advanced Directives  Does Patient Have a Medical Advance Directive? Yes  Type of Estate agent of [place];Out of facility DNR (pink MOST or yellow form);Living will  Does patient want to make changes to medical advance directive? No - Patient declined  Copy of Healthcare Power of Attorney in Chart? Yes - validated most recent copy scanned in chart (See row information)     Chief Complaint  Patient presents with   Acute Visit    Drainage from Right Eye    HPI:  Pt is a 88 y.o. male seen today for an acute visit for drainage from eyes.  Nursing reports right eye has been red and today left eye starting to become red Increase drainage this morning- purulent drainage from both eyes Eyelids remain crusted. Pt denies pain, irritation or blurred vision but hx limited due to dementia.    Past Medical History:  Diagnosis Date   Arthritis    Balance problem    Barrett esophagus    Bradycardia    Coronary artery disease    GERD (gastroesophageal reflux disease)    Gout    History of atrial fibrillation    Previously on amiodarone   Hx of CABG    Hypercholesterolemia    Hypertension    Left renal mass    Melanoma (HCC)    Left shoulder blade   Nocturia    Right epiretinal membrane 02/10/2020   Past Surgical History:  Procedure Laterality Date   CARDIAC  CATHETERIZATION  01/04/2010   COLONOSCOPY     CORONARY ARTERY BYPASS GRAFT  02/07/2010   LIMA to LAD, SVG to 2nd DX, SVG to OM   EYE SURGERY Bilateral    Cataract Extraction   HEMORRHOID SURGERY     x 2   HEMORRHOID SURGERY N/A 11/13/2012   Procedure: PPH Hemorrhoidectomy;  Surgeon: Robyne Askew, MD;  Location: [facility] OR;  Service: General;  Laterality: N/A;   INGUINAL HERNIA REPAIR Right 01/22/2017   Procedure: RIGHT INGUINAL HERNIA REPAIR WITH MESH;  Surgeon: Darnell Level, MD;  Location: [facility] OR;  Service: General;  Laterality: Right;   INSERTION OF MESH Right 01/22/2017   Procedure: INSERTION OF MESH;  Surgeon: Darnell Level, MD;  Location: [facility] OR;  Service: General;  Laterality: Right;   IR RADIOLOGIST EVAL & MGMT  01/06/2021   NASAL POLYP EXCISION     OTHER SURGICAL HISTORY  06/05/2002   Penile Implant   ROBOTIC ASSITED PARTIAL NEPHRECTOMY Left 02/25/2021   Procedure: XI ROBOTIC ASSITED PARTIAL NEPHRECTOMY;  Surgeon: Rene Paci, MD;  Location: [facility] ORS;  Service: Urology;  Laterality: Left;   UPPER GI ENDOSCOPY      Allergies  Allergen Reactions   Lexapro [Escitalopram]     Confusion, weakness   Namenda [Memantine]     Insomnia   Lisinopril Other (See Comments)    Cough  Outpatient Encounter Medications as of 03/01/2023  Medication Sig   acetaminophen (TYLENOL) 500 MG tablet Take 1,000 mg by mouth every 8 (eight) hours as needed.   amLODipine (NORVASC) 10 MG tablet TAKE 1 TABLET BY MOUTH EVERY DAY   ammonium lactate (AMLACTIN) 12 % cream Apply 1 g topically as needed for dry skin.   atorvastatin (LIPITOR) 10 MG tablet Take 0.5 tablets (5 mg total) by mouth daily at 6 PM.   bisacodyl (DULCOLAX) 10 MG suppository Place 10 mg rectally as directed. Every 24 hours as needed for constipation   cetirizine (ZYRTEC) 5 MG tablet Take 5 mg by mouth daily.   docusate sodium (COLACE) 100 MG capsule Take 1 capsule (100 mg total) by mouth 2 (two) times daily.   feeding  supplement (ENSURE ENLIVE / ENSURE PLUS) LIQD Take 237 mLs by mouth 3 (three) times daily between meals.   isosorbide mononitrate (IMDUR) 60 MG 24 hr tablet Take 1 tablet (60 mg total) by mouth every evening. Skip the dose if systolic BP less than 140 mmHg   lidocaine (LIDODERM) 5 % Place 1 patch onto the skin daily. Remove & Discard patch within 12 hours or as directed by MD   losartan (COZAAR) 50 MG tablet TAKE 1 TABLET BY MOUTH EVERY DAY   melatonin 5 MG TABS Take 1 tablet (5 mg total) by mouth every evening.   Multiple Vitamin (MULTIVITAMIN) tablet Take 1 tablet by mouth daily.   mupirocin ointment (BACTROBAN) 2 % Apply 1 Application topically 2 (two) times daily.   nitroGLYCERIN (NITROSTAT) 0.4 MG SL tablet *PLACE 1 TABLET UNDER TONGUE EVERY 5 MINS., UP TO 3 DOSES AS NEEDED FOR CHEST PAIN*   omeprazole (PRILOSEC) 20 MG capsule Take 1 capsule (20 mg total) by mouth 2 (two) times daily.   polyethylene glycol (MIRALAX / GLYCOLAX) 17 g packet Take 17 g by mouth daily as needed.   QUEtiapine (SEROQUEL) 25 MG tablet Take 12.5 mg by mouth at bedtime.   tamsulosin (FLOMAX) 0.4 MG CAPS capsule Take 1 capsule (0.4 mg total) by mouth daily after supper.   Zinc Oxide (TRIPLE PASTE) 12.8 % ointment Apply 1 Application topically as needed for irritation.   No facility-administered encounter medications on file as of 03/01/2023.    Review of Systems  Eyes:  Positive for discharge and redness. Negative for photophobia, pain, itching and visual disturbance.    Immunization History  Administered Date(s) Administered   PFIZER(Purple Top)SARS-COV-2 Vaccination 08/25/2019, 09/15/2019, 05/18/2020   Pneumococcal Conjugate-13 05/20/2014   Tdap 11/25/2021   Pertinent  Health Maintenance Due  Topic Date Due   INFLUENZA VACCINE  03/01/2023      06/01/2022    2:42 PM 06/08/2022    2:51 PM 08/31/2022    1:07 PM 09/12/2022    3:31 PM 10/13/2022   12:38 PM  Fall Risk  Falls in the past year? 1 1 0 1 1  Was  there an injury with Fall? 1 0 0 0 0  Fall Risk Category Calculator 3 2 0 2 2  Fall Risk Category (Retired) High Moderate     (RETIRED) Patient Fall Risk Level High fall risk Moderate fall risk     Patient at Risk for Falls Due to History of fall(s)  History of fall(s)    Patient at Risk for Falls Due to - Comments   cane    Fall risk Follow up Falls evaluation completed Falls evaluation completed Falls prevention discussed Falls evaluation completed Falls evaluation completed  Functional Status Survey:    Vitals:   03/01/23 1453 03/01/23 1459  BP: (!) 158/78 137/67  Pulse: 60   Resp: 18   Temp: (!) 97.5 F (36.4 C)   SpO2: 97%   Weight: 158 lb 3.2 oz (71.8 kg)   Height: 5\' 10"  (1.778 m)    Body mass index is 22.7 kg/m. Physical Exam Constitutional:      Appearance: Normal appearance.  Eyes:     Conjunctiva/sclera:     Right eye: Right conjunctiva is injected. Exudate present.     Left eye: Left conjunctiva is injected. Exudate present.  Neurological:     Mental Status: He is alert. Mental status is at baseline.  Psychiatric:        Mood and Affect: Mood normal.     Labs reviewed: Recent Labs    11/10/22 0336 11/11/22 0416 11/13/22 0600 11/23/22 0000  NA 140 137 141 148*  K 3.5 3.4* 3.8 4.0  CL 106 106 107 110*  CO2 25 22 26  28*  GLUCOSE 102* 114* 118*  --   BUN 23 25* 31* 62*  CREATININE 0.99 1.16 0.96 1.3  CALCIUM 9.0 8.8* 9.0 9.8  MG 2.0 2.0 2.2  --   PHOS 3.3 3.6 3.6  --    Recent Labs    03/06/22 1349 09/28/22 1756 11/09/22 0103 11/23/22 0000  AST 14 14* 18 83*  ALT 14 9 13  175*  ALKPHOS 57 55 59 95  BILITOT 0.4 0.6 0.8  --   PROT 6.5 6.6 6.3*  --   ALBUMIN 4.2 3.9 3.9 3.4*   Recent Labs    11/09/22 0103 11/10/22 0336 11/11/22 0416 11/13/22 0600 11/23/22 0000  WBC 10.1 7.9 9.4 7.8 20.2  NEUTROABS 7.4  --   --   --  14,483.00  HGB 11.2* 12.0* 10.8* 10.5* 12.4*  HCT 33.9* 35.1* 31.9* 31.1* 38*  MCV 101.5* 99.7 100.6* 100.3*  --    PLT 155 170 173 163 261   Lab Results  Component Value Date   TSH 2.78 03/06/2022   Lab Results  Component Value Date   HGBA1C 5.7 01/27/2022   Lab Results  Component Value Date   CHOL 112 11/24/2021   HDL 37 (L) 11/24/2021   LDLCALC 53 11/24/2021   TRIG 122 11/24/2021   CHOLHDL 3.0 11/24/2021    Significant Diagnostic Results in last 30 days:  No results found.  Assessment/Plan 1. Acute bacterial conjunctivitis of both eyes -to start ofloxin 2% drops bilaterally 1 drop q4 hour for 48 hours then 1 drop to bilateral eyes QID for 5 days  Clean with baby shampoo QID for 2 weeks.    Janene Harvey. [facility] [phone]

## 2023-03-06 DIAGNOSIS — R55 Syncope and collapse: Secondary | ICD-10-CM | POA: Diagnosis not present

## 2023-03-06 DIAGNOSIS — M6281 Muscle weakness (generalized): Secondary | ICD-10-CM | POA: Diagnosis not present

## 2023-03-06 DIAGNOSIS — Z9181 History of falling: Secondary | ICD-10-CM | POA: Diagnosis not present

## 2023-03-06 DIAGNOSIS — F02811 Dementia in other diseases classified elsewhere, unspecified severity, with agitation: Secondary | ICD-10-CM | POA: Diagnosis not present

## 2023-03-06 DIAGNOSIS — I480 Paroxysmal atrial fibrillation: Secondary | ICD-10-CM | POA: Diagnosis not present

## 2023-03-09 ENCOUNTER — Telehealth: Payer: Self-pay | Admitting: Physician Assistant

## 2023-03-09 ENCOUNTER — Encounter: Payer: Self-pay | Admitting: Physician Assistant

## 2023-03-09 NOTE — Progress Notes (Addendum)
See  Encounter note

## 2023-03-09 NOTE — Telephone Encounter (Signed)
Pts spouse is calling in stating that the pt is advancing and she is needing to know if she needs to cancel his appt on 8/13 w/[facility]. Spouse is wanting to know if she needs to bring in some kind of pw did not elaborate on what kind of pw.  Spouse is very upset that she has not gotten a call back and would like to see if she can get a call back today.

## 2023-03-09 NOTE — Progress Notes (Signed)
  Phone call   Spoke to Zachary Hall wife, Zachary Hall.  She reports that the patient's cognitive status has significantly declined.  He is no longer on memantine.  The patient needs 24/7 attention, and although he is able to drink and eat if presented with food, he requires help with all of his ADLs.  He is essentially bed ridden, and does not engage in conversations.  In addition, he has had other hospitalizations, one for a fall, with sundowning, spending 10 days in the hospital, requiring rehab.  He is under palliative care, but soon will be transitioning to hospice according to his wife.  She will be discussing with palliative care nurse regarding these issues.  We explained the nature of his cognitive decline due to Alzheimer's disease, and the irreversibility of this.  Agree with discontinuation of memantine, as it is no longer therapeutic.  His wife was very appreciative about our call.  There is no indication for follow-up appointment at our office.  We offered video visit and she politely declined, understanding that there is no intervention from neurology, which could help reverse the cognitive symptoms.  It has been a privilege to take care of this nice patient.

## 2023-03-09 NOTE — Progress Notes (Deleted)
  Phone call   Spoke to Mr. Eckerd wife, Gershon Cull.  She reports that the patient's cognitive status has significantly declined.  He is no longer on memantine.  The patient needs 24/7 attention, and although he is able to drink and eat if presented with food, he requires help with all of his ADLs.  He is essentially bed ridden, and does not engage in conversations.  In addition, he has had other hospitalizations, one for a fall, with sundowning, spending 10 days in the hospital, requiring rehab.  He is under palliative care, but soon will be transitioning to hospice according to his wife.  She will be discussing with palliative care nurse regarding these issues.  We explained the nature of his cognitive decline due to Alzheimer's disease, and the irreversibility of this.  Agree with discontinuation of memantine, as it is no longer therapeutic.  His wife was very appreciative about our call.  There is no indication for follow-up appointment at our office.  We offered video visit and she politely declined, understanding that there is no intervention from neurology, which could help reverse the cognitive symptoms.  It has been a privilege to take care of this nice patient.

## 2023-03-12 ENCOUNTER — Non-Acute Institutional Stay (SKILLED_NURSING_FACILITY): Payer: Medicare Other | Admitting: Student

## 2023-03-12 ENCOUNTER — Encounter: Payer: Self-pay | Admitting: Student

## 2023-03-12 DIAGNOSIS — G301 Alzheimer's disease with late onset: Secondary | ICD-10-CM | POA: Diagnosis not present

## 2023-03-12 DIAGNOSIS — F02C11 Dementia in other diseases classified elsewhere, severe, with agitation: Secondary | ICD-10-CM

## 2023-03-12 DIAGNOSIS — Z515 Encounter for palliative care: Secondary | ICD-10-CM | POA: Diagnosis not present

## 2023-03-12 DIAGNOSIS — H02109 Unspecified ectropion of unspecified eye, unspecified eyelid: Secondary | ICD-10-CM

## 2023-03-12 DIAGNOSIS — I129 Hypertensive chronic kidney disease with stage 1 through stage 4 chronic kidney disease, or unspecified chronic kidney disease: Secondary | ICD-10-CM | POA: Diagnosis not present

## 2023-03-12 LAB — BASIC METABOLIC PANEL
BUN: 21 (ref 4–21)
CO2: 29 — AB (ref 13–22)
Chloride: 103 (ref 99–108)
Creatinine: 1 (ref 0.6–1.3)
Glucose: 86
Potassium: 3.9 mEq/L (ref 3.5–5.1)
Sodium: 142 (ref 137–147)

## 2023-03-12 LAB — COMPREHENSIVE METABOLIC PANEL
Calcium: 8.9 (ref 8.7–10.7)
eGFR: 56

## 2023-03-12 NOTE — Progress Notes (Signed)
Location:      Place of Service:    Provider:  Herma Carson, Dayton Scrape, NP  Patient Care Team: Elenore Paddy, NP as PCP - General (Nurse Practitioner) [place], Peter M, MD as PCP - Cardiology (Cardiology)  Extended Emergency Contact Information Primary Emergency Contact: [name],[name] Address: [address]          [place], [place] [address] Darden Amber of [place] Home Phone: [phone] Mobile Phone: [phone] Relation: Spouse  Code Status:  DNR Goals of care: Advanced Directive information    03/01/2023    2:58 PM  Advanced Directives  Does Patient Have a Medical Advance Directive? Yes  Type of Estate agent of [place];Out of facility DNR (pink MOST or yellow form);Living will  Does patient want to make changes to medical advance directive? No - Patient declined  Copy of Healthcare Power of Attorney in Chart? Yes - validated most recent copy scanned in chart (See row information)     Chief Complaint  Patient presents with   Goals of Care    HPI:  Pt is a 88 y.o. male seen today for an acute visit for Discussion of goals of care.   Spouse has noticed his decline as well as his specialists - cardiology and neurology do not recommend continued follow up. She also endorses significant financial strain regarding care for the patient. Discussed possibility of transition to medicaid as previously mentioned by social work as well, declined at this time. Discussed the option of returning home with support and hospice, declined and endorsed discomfort with administrating medications for end of life because that would be "like putting him down." She would prefer he stays here. She wants to be an advocate and make sure he continues to receive good care.    Past Medical History:  Diagnosis Date   Arthritis    Balance problem    Barrett esophagus    Bradycardia    Coronary artery disease    GERD (gastroesophageal reflux disease)    Gout     History of atrial fibrillation    Previously on amiodarone   Hx of CABG    Hypercholesterolemia    Hypertension    Left renal mass    Melanoma (HCC)    Left shoulder blade   Nocturia    Right epiretinal membrane 02/10/2020   Past Surgical History:  Procedure Laterality Date   CARDIAC CATHETERIZATION  01/04/2010   COLONOSCOPY     CORONARY ARTERY BYPASS GRAFT  02/07/2010   LIMA to LAD, SVG to 2nd DX, SVG to OM   EYE SURGERY Bilateral    Cataract Extraction   HEMORRHOID SURGERY     x 2   HEMORRHOID SURGERY N/A 11/13/2012   Procedure: PPH Hemorrhoidectomy;  Surgeon: Robyne Askew, MD;  Location: [facility] OR;  Service: General;  Laterality: N/A;   INGUINAL HERNIA REPAIR Right 01/22/2017   Procedure: RIGHT INGUINAL HERNIA REPAIR WITH MESH;  Surgeon: Darnell Level, MD;  Location: [facility] OR;  Service: General;  Laterality: Right;   INSERTION OF MESH Right 01/22/2017   Procedure: INSERTION OF MESH;  Surgeon: Darnell Level, MD;  Location: [facility] OR;  Service: General;  Laterality: Right;   IR RADIOLOGIST EVAL & MGMT  01/06/2021   NASAL POLYP EXCISION     OTHER SURGICAL HISTORY  06/05/2002   Penile Implant   ROBOTIC ASSITED PARTIAL NEPHRECTOMY Left 02/25/2021   Procedure: XI ROBOTIC ASSITED PARTIAL NEPHRECTOMY;  Surgeon: Rene Paci, MD;  Location: [facility] ORS;  Service: Urology;  Laterality: Left;   UPPER GI ENDOSCOPY      Allergies  Allergen Reactions   Lexapro [Escitalopram]     Confusion, weakness   Namenda [Memantine]     Insomnia   Lisinopril Other (See Comments)    Cough    Outpatient Encounter Medications as of 03/12/2023  Medication Sig   acetaminophen (TYLENOL) 500 MG tablet Take 1,000 mg by mouth every 8 (eight) hours as needed.   amLODipine (NORVASC) 10 MG tablet TAKE 1 TABLET BY MOUTH EVERY DAY   ammonium lactate (AMLACTIN) 12 % cream Apply 1 g topically as needed for dry skin.   atorvastatin (LIPITOR) 10 MG tablet Take 0.5 tablets (5 mg total) by mouth daily at 6 PM.    bisacodyl (DULCOLAX) 10 MG suppository Place 10 mg rectally as directed. Every 24 hours as needed for constipation   cetirizine (ZYRTEC) 5 MG tablet Take 5 mg by mouth daily.   docusate sodium (COLACE) 100 MG capsule Take 1 capsule (100 mg total) by mouth 2 (two) times daily.   feeding supplement (ENSURE ENLIVE / ENSURE PLUS) LIQD Take 237 mLs by mouth 3 (three) times daily between meals.   isosorbide mononitrate (IMDUR) 60 MG 24 hr tablet Take 1 tablet (60 mg total) by mouth every evening. Skip the dose if systolic BP less than 140 mmHg   lidocaine (LIDODERM) 5 % Place 1 patch onto the skin daily. Remove & Discard patch within 12 hours or as directed by MD   losartan (COZAAR) 50 MG tablet TAKE 1 TABLET BY MOUTH EVERY DAY   melatonin 5 MG TABS Take 1 tablet (5 mg total) by mouth every evening.   Multiple Vitamin (MULTIVITAMIN) tablet Take 1 tablet by mouth daily.   mupirocin ointment (BACTROBAN) 2 % Apply 1 Application topically 2 (two) times daily.   nitroGLYCERIN (NITROSTAT) 0.4 MG SL tablet *PLACE 1 TABLET UNDER TONGUE EVERY 5 MINS., UP TO 3 DOSES AS NEEDED FOR CHEST PAIN*   omeprazole (PRILOSEC) 20 MG capsule Take 1 capsule (20 mg total) by mouth 2 (two) times daily.   polyethylene glycol (MIRALAX / GLYCOLAX) 17 g packet Take 17 g by mouth daily as needed.   QUEtiapine (SEROQUEL) 25 MG tablet Take 12.5 mg by mouth at bedtime.   tamsulosin (FLOMAX) 0.4 MG CAPS capsule Take 1 capsule (0.4 mg total) by mouth daily after supper.   Zinc Oxide (TRIPLE PASTE) 12.8 % ointment Apply 1 Application topically as needed for irritation.   No facility-administered encounter medications on file as of 03/12/2023.    Review of Systems  Immunization History  Administered Date(s) Administered   PFIZER(Purple Top)SARS-COV-2 Vaccination 08/25/2019, 09/15/2019, 05/18/2020   Pneumococcal Conjugate-13 05/20/2014   Tdap 11/25/2021   Pertinent  Health Maintenance Due  Topic Date Due   INFLUENZA VACCINE   03/01/2023      06/01/2022    2:42 PM 06/08/2022    2:51 PM 08/31/2022    1:07 PM 09/12/2022    3:31 PM 10/13/2022   12:38 PM  Fall Risk  Falls in the past year? 1 1 0 1 1  Was there an injury with Fall? 1 0 0 0 0  Fall Risk Category Calculator 3 2 0 2 2  Fall Risk Category (Retired) High Moderate     (RETIRED) Patient Fall Risk Level High fall risk Moderate fall risk     Patient at Risk for Falls Due to History of fall(s)  History of fall(s)  Patient at Risk for Falls Due to - Comments   cane    Fall risk Follow up Falls evaluation completed Falls evaluation completed Falls prevention discussed Falls evaluation completed Falls evaluation completed   Functional Status Survey:    Vitals:   03/12/23 1415  BP: (!) 161/71  Pulse: 60  Resp: 17  Temp: 97.7 F (36.5 C)  SpO2: 98%  Weight: 158 lb 3.2 oz (71.8 kg)   Body mass index is 22.7 kg/m. Physical Exam Vitals reviewed.  Eyes:     Comments: Right eye with outward turning eyelid and clear liquid dropping from eye.  Pulmonary:     Effort: Pulmonary effort is normal.  Skin:    General: Skin is warm and dry.  Neurological:     Mental Status: He is alert. Mental status is at baseline. He is disoriented.     Labs reviewed: Recent Labs    11/10/22 0336 11/11/22 0416 11/13/22 0600 11/23/22 0000  NA 140 137 141 148*  K 3.5 3.4* 3.8 4.0  CL 106 106 107 110*  CO2 25 22 26  28*  GLUCOSE 102* 114* 118*  --   BUN 23 25* 31* 62*  CREATININE 0.99 1.16 0.96 1.3  CALCIUM 9.0 8.8* 9.0 9.8  MG 2.0 2.0 2.2  --   PHOS 3.3 3.6 3.6  --    Recent Labs    09/28/22 1756 11/09/22 0103 11/23/22 0000  AST 14* 18 83*  ALT 9 13 175*  ALKPHOS 55 59 95  BILITOT 0.6 0.8  --   PROT 6.6 6.3*  --   ALBUMIN 3.9 3.9 3.4*   Recent Labs    11/09/22 0103 11/10/22 0336 11/11/22 0416 11/13/22 0600 11/23/22 0000  WBC 10.1 7.9 9.4 7.8 20.2  NEUTROABS 7.4  --   --   --  14,483.00  HGB 11.2* 12.0* 10.8* 10.5* 12.4*  HCT 33.9* 35.1*  31.9* 31.1* 38*  MCV 101.5* 99.7 100.6* 100.3*  --   PLT 155 170 173 163 261   Lab Results  Component Value Date   TSH 2.78 03/06/2022   Lab Results  Component Value Date   HGBA1C 5.7 01/27/2022   Lab Results  Component Value Date   CHOL 112 11/24/2021   HDL 37 (L) 11/24/2021   LDLCALC 53 11/24/2021   TRIG 122 11/24/2021   CHOLHDL 3.0 11/24/2021    Significant Diagnostic Results in last 30 days:  No results found.  Assessment/Plan 1. Severe late onset Alzheimer's dementia with agitation (HCC) Patient's dementia has progressed to stage 7A. He requires total care. He is non ambulatory, incontinent of urine and bowel, and cannot conduct a conversation (typically speaks in nonsensical phrases). After previous and current discussion with wife and her discussion with neurology and cardiology, the decision has been made to transition patient to hospice level of care. Discussed this means that if he is to get ill, the primary goal of treatment would be comfort measures rather than curative treatment.  - Ambulatory referral to Hospice  2. 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 He has had increased drainage from the eye. Previously discussed with his spouse concern that it will not have significant improvement and treatment is typically surgery. Of note, patient has had 2 rounds of treatment for conjunctivitis. Continues QID artificial tears. Continue supportive care at this time as surgery is undesirable. During previous interaction 01/2023 she declined ophthalmology as surgery is not desired.  Family/ staff Communication: nursing, spouse  Labs/tests ordered:  none

## 2023-03-13 ENCOUNTER — Ambulatory Visit: Payer: Medicare Other | Admitting: Physician Assistant

## 2023-03-13 DIAGNOSIS — F0150 Vascular dementia without behavioral disturbance: Secondary | ICD-10-CM

## 2023-03-27 ENCOUNTER — Encounter: Payer: Self-pay | Admitting: Nurse Practitioner

## 2023-03-27 ENCOUNTER — Non-Acute Institutional Stay (SKILLED_NURSING_FACILITY): Payer: Medicare Other | Admitting: Nurse Practitioner

## 2023-03-27 DIAGNOSIS — G301 Alzheimer's disease with late onset: Secondary | ICD-10-CM | POA: Diagnosis not present

## 2023-03-27 DIAGNOSIS — K219 Gastro-esophageal reflux disease without esophagitis: Secondary | ICD-10-CM

## 2023-03-27 DIAGNOSIS — N400 Enlarged prostate without lower urinary tract symptoms: Secondary | ICD-10-CM | POA: Diagnosis not present

## 2023-03-27 DIAGNOSIS — F02C11 Dementia in other diseases classified elsewhere, severe, with agitation: Secondary | ICD-10-CM

## 2023-03-27 DIAGNOSIS — E43 Unspecified severe protein-calorie malnutrition: Secondary | ICD-10-CM | POA: Diagnosis not present

## 2023-03-27 DIAGNOSIS — K5904 Chronic idiopathic constipation: Secondary | ICD-10-CM

## 2023-03-27 NOTE — Progress Notes (Signed)
Location:  Other Nursing Home Room Number: [facility] 103A Place of Service:  SNF (31)  Elenore Paddy, NP  Patient Care Team: Elenore Paddy, NP as PCP - General (Nurse Practitioner) [place], Peter M, MD as PCP - Cardiology (Cardiology)  Extended Emergency Contact Information Primary Emergency Contact: [name],[name] Address: [address]          [place], [place] [address] Darden Amber of [place] Home Phone: [phone] Mobile Phone: [phone] Relation: Spouse  Goals of care: Advanced Directive information    03/27/2023    1:01 PM  Advanced Directives  Does Patient Have a Medical Advance Directive? Yes  Type of Estate agent of [place];Out of facility DNR (pink MOST or yellow form);Living will  Does patient want to make changes to medical advance directive? No - Patient declined  Copy of Healthcare Power of Attorney in Chart? Yes - validated most recent copy scanned in chart (See row information)     Chief Complaint  Patient presents with   Medical Management of Chronic Issues    Medical Management of Chronic Issues.     HPI:  Pt is a 88 y.o. male seen today for medical management of chronic disease.  Pt is now on hospice services due to ongoing decline from progressive dementia.  He is more lethargic and sleeping most of the day for the last week.  Unable to feed himself at this time but does wake up to be fed.  Staff helping him eat and drink.  He is not doing well with sit to stand and requiring 2+ assistance.      Past Medical History:  Diagnosis Date   Arthritis    Balance problem    Barrett esophagus    Bradycardia    Coronary artery disease    GERD (gastroesophageal reflux disease)    Gout    History of atrial fibrillation    Previously on amiodarone   Hx of CABG    Hypercholesterolemia    Hypertension    Left renal mass    Melanoma (HCC)    Left shoulder blade   Nocturia    Right epiretinal membrane  02/10/2020   Past Surgical History:  Procedure Laterality Date   CARDIAC CATHETERIZATION  01/04/2010   COLONOSCOPY     CORONARY ARTERY BYPASS GRAFT  02/07/2010   LIMA to LAD, SVG to 2nd DX, SVG to OM   EYE SURGERY Bilateral    Cataract Extraction   HEMORRHOID SURGERY     x 2   HEMORRHOID SURGERY N/A 11/13/2012   Procedure: PPH Hemorrhoidectomy;  Surgeon: Robyne Askew, MD;  Location: [facility] OR;  Service: General;  Laterality: N/A;   INGUINAL HERNIA REPAIR Right 01/22/2017   Procedure: RIGHT INGUINAL HERNIA REPAIR WITH MESH;  Surgeon: Darnell Level, MD;  Location: [facility] OR;  Service: General;  Laterality: Right;   INSERTION OF MESH Right 01/22/2017   Procedure: INSERTION OF MESH;  Surgeon: Darnell Level, MD;  Location: [facility] OR;  Service: General;  Laterality: Right;   IR RADIOLOGIST EVAL & MGMT  01/06/2021   NASAL POLYP EXCISION     OTHER SURGICAL HISTORY  06/05/2002   Penile Implant   ROBOTIC ASSITED PARTIAL NEPHRECTOMY Left 02/25/2021   Procedure: XI ROBOTIC ASSITED PARTIAL NEPHRECTOMY;  Surgeon: Rene Paci, MD;  Location: [facility] ORS;  Service: Urology;  Laterality: Left;   UPPER GI ENDOSCOPY      Allergies  Allergen Reactions   Lexapro [Escitalopram]  Confusion, weakness   Namenda [Memantine]     Insomnia   Lisinopril Other (See Comments)    Cough    Outpatient Encounter Medications as of 03/27/2023  Medication Sig   acetaminophen (TYLENOL) 500 MG tablet Take 1,000 mg by mouth every 8 (eight) hours as needed.   amLODipine (NORVASC) 10 MG tablet TAKE 1 TABLET BY MOUTH EVERY DAY   ammonium lactate (AMLACTIN) 12 % cream Apply 1 g topically as needed for dry skin.   atorvastatin (LIPITOR) 10 MG tablet Take 0.5 tablets (5 mg total) by mouth daily at 6 PM.   bisacodyl (DULCOLAX) 10 MG suppository Place 10 mg rectally as directed. Every 24 hours as needed for constipation   cetirizine (ZYRTEC) 5 MG tablet Take 5 mg by mouth daily.   docusate sodium (COLACE) 100 MG capsule  Take 1 capsule (100 mg total) by mouth 2 (two) times daily.   feeding supplement (ENSURE ENLIVE / ENSURE PLUS) LIQD Take 237 mLs by mouth 3 (three) times daily between meals.   isosorbide mononitrate (IMDUR) 60 MG 24 hr tablet Take 1 tablet (60 mg total) by mouth every evening. Skip the dose if systolic BP less than 140 mmHg   lidocaine (LIDODERM) 5 % Place 1 patch onto the skin daily. Remove & Discard patch within 12 hours or as directed by MD   losartan (COZAAR) 100 MG tablet Take 100 mg by mouth daily.   melatonin 5 MG TABS Take 1 tablet (5 mg total) by mouth every evening.   Multiple Vitamin (MULTIVITAMIN) tablet Take 1 tablet by mouth daily.   mupirocin ointment (BACTROBAN) 2 % Apply 1 Application topically 2 (two) times daily.   nitroGLYCERIN (NITROSTAT) 0.4 MG SL tablet *PLACE 1 TABLET UNDER TONGUE EVERY 5 MINS., UP TO 3 DOSES AS NEEDED FOR CHEST PAIN*   omeprazole (PRILOSEC) 20 MG capsule Take 1 capsule (20 mg total) by mouth 2 (two) times daily.   polyethylene glycol (MIRALAX / GLYCOLAX) 17 g packet Take 17 g by mouth daily as needed.   QUEtiapine (SEROQUEL) 25 MG tablet Take 12.5 mg by mouth at bedtime.   tamsulosin (FLOMAX) 0.4 MG CAPS capsule Take 1 capsule (0.4 mg total) by mouth daily after supper.   Zinc Oxide (TRIPLE PASTE) 12.8 % ointment Apply 1 Application topically as needed for irritation.   [DISCONTINUED] losartan (COZAAR) 50 MG tablet TAKE 1 TABLET BY MOUTH EVERY DAY   No facility-administered encounter medications on file as of 03/27/2023.    Review of Systems  Unable to perform ROS: Dementia     Immunization History  Administered Date(s) Administered   PFIZER(Purple Top)SARS-COV-2 Vaccination 08/25/2019, 09/15/2019, 05/18/2020   Pneumococcal Conjugate-13 05/20/2014   Tdap 11/25/2021   Pertinent  Health Maintenance Due  Topic Date Due   INFLUENZA VACCINE  03/01/2023      06/01/2022    2:42 PM 06/08/2022    2:51 PM 08/31/2022    1:07 PM 09/12/2022    3:31 PM  10/13/2022   12:38 PM  Fall Risk  Falls in the past year? 1 1 0 1 1  Was there an injury with Fall? 1 0 0 0 0  Fall Risk Category Calculator 3 2 0 2 2  Fall Risk Category (Retired) High Moderate     (RETIRED) Patient Fall Risk Level High fall risk Moderate fall risk     Patient at Risk for Falls Due to History of fall(s)  History of fall(s)    Patient at Risk for Falls  Due to - Comments   cane    Fall risk Follow up Falls evaluation completed Falls evaluation completed Falls prevention discussed Falls evaluation completed Falls evaluation completed   Functional Status Survey:    Vitals:   03/27/23 1252 03/27/23 1304  BP: (!) 148/57 (!) 146/62  Pulse: 68   Resp: 18   Temp: 97.6 F (36.4 C)   SpO2: 95%   Weight: 158 lb 3.2 oz (71.8 kg)   Height: 5\' 10"  (1.778 m)    Body mass index is 22.7 kg/m. Physical Exam Constitutional:      General: He is not in acute distress.    Appearance: He is well-developed. He is not diaphoretic.  HENT:     Head: Normocephalic and atraumatic.     Right Ear: External ear normal.     Left Ear: External ear normal.     Mouth/Throat:     Pharynx: No oropharyngeal exudate.  Eyes:     Conjunctiva/sclera: Conjunctivae normal.     Pupils: Pupils are equal, round, and reactive to light.  Cardiovascular:     Rate and Rhythm: Normal rate and regular rhythm.     Heart sounds: Normal heart sounds.  Pulmonary:     Effort: Pulmonary effort is normal.     Breath sounds: Normal breath sounds.  Abdominal:     General: Bowel sounds are normal.     Palpations: Abdomen is soft.  Musculoskeletal:        General: No tenderness.     Cervical back: Normal range of motion and neck supple.     Right lower leg: No edema.     Left lower leg: No edema.  Skin:    General: Skin is warm and dry.  Neurological:     Mental Status: He is alert and oriented to person, place, and time.    Labs reviewed: Recent Labs    11/10/22 0336 11/11/22 0416 11/13/22 0600  11/23/22 0000 03/12/23 0000  NA 140 137 141 148* 142  K 3.5 3.4* 3.8 4.0 3.9  CL 106 106 107 110* 103  CO2 25 22 26  28* 29*  GLUCOSE 102* 114* 118*  --   --   BUN 23 25* 31* 62* 21  CREATININE 0.99 1.16 0.96 1.3 1.0  CALCIUM 9.0 8.8* 9.0 9.8 8.9  MG 2.0 2.0 2.2  --   --   PHOS 3.3 3.6 3.6  --   --    Recent Labs    09/28/22 1756 11/09/22 0103 11/23/22 0000  AST 14* 18 83*  ALT 9 13 175*  ALKPHOS 55 59 95  BILITOT 0.6 0.8  --   PROT 6.6 6.3*  --   ALBUMIN 3.9 3.9 3.4*   Recent Labs    11/09/22 0103 11/10/22 0336 11/11/22 0416 11/13/22 0600 11/23/22 0000  WBC 10.1 7.9 9.4 7.8 20.2  NEUTROABS 7.4  --   --   --  14,483.00  HGB 11.2* 12.0* 10.8* 10.5* 12.4*  HCT 33.9* 35.1* 31.9* 31.1* 38*  MCV 101.5* 99.7 100.6* 100.3*  --   PLT 155 170 173 163 261   Lab Results  Component Value Date   TSH 2.78 03/06/2022   Lab Results  Component Value Date   HGBA1C 5.7 01/27/2022   Lab Results  Component Value Date   CHOL 112 11/24/2021   HDL 37 (L) 11/24/2021   LDLCALC 53 11/24/2021   TRIG 122 11/24/2021   CHOLHDL 3.0 11/24/2021    Significant Diagnostic Results in  last 30 days:  No results found.  Assessment/Plan 1. Severe late onset Alzheimer's dementia with agitation (HCC) -progressive decline to disease, he is more lethargic  and now requiring to be fed.  Will change seroquel to PRN to see if he can tolerate being off medication.  Will stop melatonin at this time    2. Gastroesophageal reflux disease, unspecified whether esophagitis present No signs of worsening symptoms. Continues on Prilosec daily  3. Protein-calorie malnutrition, severe (HCC) Weight has been stable, continues on supplements and assistance with staff.   4. Benign prostatic hyperplasia, unspecified whether lower urinary tract symptoms present -continues on flomax, no reports of urination.   5. Chronic idiopathic constipation Stable, has minimal bms per staff but no signs of worsening  constipation  Alistair Senft K. Biagio Borg [facility] [phone]

## 2023-04-01 ENCOUNTER — Telehealth: Payer: Self-pay | Admitting: Family

## 2023-04-01 NOTE — Telephone Encounter (Signed)
Facility nurse called to report patient has not voided for the past 8 hours.Stated patient drinking fluids. No abdominal distention or tenderness.Orders given for bladder scan.  Please follow-up

## 2023-04-03 ENCOUNTER — Other Ambulatory Visit: Payer: Self-pay | Admitting: Nurse Practitioner

## 2023-04-03 ENCOUNTER — Encounter: Payer: Self-pay | Admitting: Nurse Practitioner

## 2023-04-03 ENCOUNTER — Non-Acute Institutional Stay (SKILLED_NURSING_FACILITY): Payer: Medicare Other | Admitting: Nurse Practitioner

## 2023-04-03 DIAGNOSIS — N400 Enlarged prostate without lower urinary tract symptoms: Secondary | ICD-10-CM | POA: Diagnosis not present

## 2023-04-03 NOTE — Progress Notes (Signed)
Location:  Other [place].  Nursing Home Room Number: [facility] 103A Place of Service:  SNF (31) [name], NP  PCP: Elenore Paddy, NP  Patient Care Team: Elenore Paddy, NP as PCP - General (Nurse Practitioner) [place], Peter M, MD as PCP - Cardiology (Cardiology)  Extended Emergency Contact Information Primary Emergency Contact: [facility] Address: [address]          [place], [place] [address] Darden Amber of [place] Home Phone: [phone] Mobile Phone: [phone] Relation: Spouse  Goals of care: Advanced Directive information    04/03/2023    9:48 AM  Advanced Directives  Does Patient Have a Medical Advance Directive? Yes  Type of Estate agent of [place];Out of facility DNR (pink MOST or yellow form);Living will  Does patient want to make changes to medical advance directive? No - Patient declined  Copy of Healthcare Power of Attorney in Chart? Yes - validated most recent copy scanned in chart (See row information)     Chief Complaint  Patient presents with   Acute Visit    Not Voiding.     HPI:  Pt is a 88 y.o. male seen today for an acute visit for Not Voiding over the weekend and a foley catheter was inserted. He has not had hx of urinary obstruction but does have BPH  Staff reports clear yellow urine from catheter.  He is eating and drinking well.   Past Medical History:  Diagnosis Date   Arthritis    Balance problem    Barrett esophagus    Bradycardia    Coronary artery disease    GERD (gastroesophageal reflux disease)    Gout    History of atrial fibrillation    Previously on amiodarone   Hx of CABG    Hypercholesterolemia    Hypertension    Left renal mass    Melanoma (HCC)    Left shoulder blade   Nocturia    Right epiretinal membrane 02/10/2020   Past Surgical History:  Procedure Laterality Date   CARDIAC CATHETERIZATION  01/04/2010   COLONOSCOPY     CORONARY ARTERY BYPASS GRAFT   02/07/2010   LIMA to LAD, SVG to 2nd DX, SVG to OM   EYE SURGERY Bilateral    Cataract Extraction   HEMORRHOID SURGERY     x 2   HEMORRHOID SURGERY N/A 11/13/2012   Procedure: PPH Hemorrhoidectomy;  Surgeon: Robyne Askew, MD;  Location: [facility] OR;  Service: General;  Laterality: N/A;   INGUINAL HERNIA REPAIR Right 01/22/2017   Procedure: RIGHT INGUINAL HERNIA REPAIR WITH MESH;  Surgeon: Darnell Level, MD;  Location: [facility] OR;  Service: General;  Laterality: Right;   INSERTION OF MESH Right 01/22/2017   Procedure: INSERTION OF MESH;  Surgeon: Darnell Level, MD;  Location: [facility] OR;  Service: General;  Laterality: Right;   IR RADIOLOGIST EVAL & MGMT  01/06/2021   NASAL POLYP EXCISION     OTHER SURGICAL HISTORY  06/05/2002   Penile Implant   ROBOTIC ASSITED PARTIAL NEPHRECTOMY Left 02/25/2021   Procedure: XI ROBOTIC ASSITED PARTIAL NEPHRECTOMY;  Surgeon: Rene Paci, MD;  Location: [facility] ORS;  Service: Urology;  Laterality: Left;   UPPER GI ENDOSCOPY      Allergies  Allergen Reactions   Lexapro [Escitalopram]     Confusion, weakness   Namenda [Memantine]     Insomnia   Lisinopril Other (See Comments)    Cough    Outpatient Encounter Medications as of  04/03/2023  Medication Sig   acetaminophen (TYLENOL) 500 MG tablet Take 1,000 mg by mouth every 8 (eight) hours as needed.   amLODipine (NORVASC) 10 MG tablet TAKE 1 TABLET BY MOUTH EVERY DAY   ammonium lactate (AMLACTIN) 12 % cream Apply 1 g topically as needed for dry skin.   atorvastatin (LIPITOR) 10 MG tablet Take 0.5 tablets (5 mg total) by mouth daily at 6 PM.   bisacodyl (DULCOLAX) 10 MG suppository Place 10 mg rectally as directed. Every 24 hours as needed for constipation   cetirizine (ZYRTEC) 5 MG tablet Take 5 mg by mouth daily.   docusate sodium (COLACE) 100 MG capsule Take 1 capsule (100 mg total) by mouth 2 (two) times daily.   feeding supplement (ENSURE ENLIVE / ENSURE PLUS) LIQD Take 237 mLs by mouth 3 (three) times  daily between meals.   isosorbide mononitrate (IMDUR) 60 MG 24 hr tablet Take 1 tablet (60 mg total) by mouth every evening. Skip the dose if systolic BP less than 140 mmHg   lidocaine (LIDODERM) 5 % Place 1 patch onto the skin daily. Remove & Discard patch within 12 hours or as directed by MD   losartan (COZAAR) 100 MG tablet Take 100 mg by mouth daily.   Multiple Vitamin (MULTIVITAMIN) tablet Take 1 tablet by mouth daily.   mupirocin ointment (BACTROBAN) 2 % Apply 1 Application topically 2 (two) times daily.   nitroGLYCERIN (NITROSTAT) 0.4 MG SL tablet *PLACE 1 TABLET UNDER TONGUE EVERY 5 MINS., UP TO 3 DOSES AS NEEDED FOR CHEST PAIN*   omeprazole (PRILOSEC) 20 MG capsule Take 1 capsule (20 mg total) by mouth 2 (two) times daily.   polyethylene glycol (MIRALAX / GLYCOLAX) 17 g packet Take 17 g by mouth daily as needed.   QUEtiapine (SEROQUEL) 25 MG tablet Take 12.5 mg by mouth at bedtime.   tamsulosin (FLOMAX) 0.4 MG CAPS capsule Take 1 capsule (0.4 mg total) by mouth daily after supper.   Zinc Oxide (TRIPLE PASTE) 12.8 % ointment Apply 1 Application topically as needed for irritation.   [DISCONTINUED] melatonin 5 MG TABS Take 1 tablet (5 mg total) by mouth every evening.   No facility-administered encounter medications on file as of 04/03/2023.    Review of Systems  Unable to perform ROS: Dementia    Immunization History  Administered Date(s) Administered   PFIZER(Purple Top)SARS-COV-2 Vaccination 08/25/2019, 09/15/2019, 05/18/2020   Pneumococcal Conjugate-13 05/20/2014   Tdap 11/25/2021   Pertinent  Health Maintenance Due  Topic Date Due   INFLUENZA VACCINE  Never done      06/01/2022    2:42 PM 06/08/2022    2:51 PM 08/31/2022    1:07 PM 09/12/2022    3:31 PM 10/13/2022   12:38 PM  Fall Risk  Falls in the past year? 1 1 0 1 1  Was there an injury with Fall? 1 0 0 0 0  Fall Risk Category Calculator 3 2 0 2 2  Fall Risk Category (Retired) High Moderate     (RETIRED) Patient Fall  Risk Level High fall risk Moderate fall risk     Patient at Risk for Falls Due to History of fall(s)  History of fall(s)    Patient at Risk for Falls Due to - Comments   cane    Fall risk Follow up Falls evaluation completed Falls evaluation completed Falls prevention discussed Falls evaluation completed Falls evaluation completed   Functional Status Survey:    Vitals:   04/03/23 0938 04/03/23  1027  BP: (!) 161/79 112/64  Pulse: 76   Resp: 18   Temp: 98.3 F (36.8 C)   SpO2: 93%   Weight: 180 lb (81.6 kg)   Height: 5\' 10"  (1.778 m)    Body mass index is 25.83 kg/m. Physical Exam Constitutional:      General: He is not in acute distress.    Appearance: He is well-developed. He is not diaphoretic.  HENT:     Head: Normocephalic and atraumatic.     Right Ear: External ear normal.     Left Ear: External ear normal.     Mouth/Throat:     Pharynx: No oropharyngeal exudate.  Eyes:     Conjunctiva/sclera: Conjunctivae normal.     Pupils: Pupils are equal, round, and reactive to light.  Cardiovascular:     Rate and Rhythm: Normal rate and regular rhythm.     Heart sounds: Normal heart sounds.  Pulmonary:     Effort: Pulmonary effort is normal.     Breath sounds: Normal breath sounds.  Abdominal:     General: Bowel sounds are normal.     Palpations: Abdomen is soft.  Musculoskeletal:        General: No tenderness.     Cervical back: Normal range of motion and neck supple.     Right lower leg: No edema.     Left lower leg: No edema.  Skin:    General: Skin is warm and dry.  Neurological:     Mental Status: He is alert.     Motor: Weakness present.     Gait: Gait abnormal.  Psychiatric:        Cognition and Memory: Cognition is impaired. Memory is impaired. He exhibits impaired recent memory and impaired remote memory.    Labs reviewed: Recent Labs    11/10/22 0336 11/11/22 0416 11/13/22 0600 11/23/22 0000 03/12/23 0000  NA 140 137 141 148* 142  K 3.5 3.4* 3.8  4.0 3.9  CL 106 106 107 110* 103  CO2 25 22 26  28* 29*  GLUCOSE 102* 114* 118*  --   --   BUN 23 25* 31* 62* 21  CREATININE 0.99 1.16 0.96 1.3 1.0  CALCIUM 9.0 8.8* 9.0 9.8 8.9  MG 2.0 2.0 2.2  --   --   PHOS 3.3 3.6 3.6  --   --    Recent Labs    09/28/22 1756 11/09/22 0103 11/23/22 0000  AST 14* 18 83*  ALT 9 13 175*  ALKPHOS 55 59 95  BILITOT 0.6 0.8  --   PROT 6.6 6.3*  --   ALBUMIN 3.9 3.9 3.4*   Recent Labs    11/09/22 0103 11/10/22 0336 11/11/22 0416 11/13/22 0600 11/23/22 0000  WBC 10.1 7.9 9.4 7.8 20.2  NEUTROABS 7.4  --   --   --  14,483.00  HGB 11.2* 12.0* 10.8* 10.5* 12.4*  HCT 33.9* 35.1* 31.9* 31.1* 38*  MCV 101.5* 99.7 100.6* 100.3*  --   PLT 155 170 173 163 261   Lab Results  Component Value Date   TSH 2.78 03/06/2022   Lab Results  Component Value Date   HGBA1C 5.7 01/27/2022   Lab Results  Component Value Date   CHOL 112 11/24/2021   HDL 37 (L) 11/24/2021   LDLCALC 53 11/24/2021   TRIG 122 11/24/2021   CHOLHDL 3.0 11/24/2021    Significant Diagnostic Results in last 30 days:  No results found.  Assessment/Plan 1. Benign prostatic  hyperplasia, unspecified whether lower urinary tract symptoms present -over the weekend had not voided for 8 hours and foley catheter was placed.  No signs of infection.  Will have staff dc foley and monitor for urine output. Will I&O cath if he does not void for 8 hours.  Continues on flomax daily   2. Constipation Will schedule mirlalax three times weekly   Avonelle Viveros K. Biagio Borg [facility] [phone]

## 2023-05-01 DIAGNOSIS — H9193 Unspecified hearing loss, bilateral: Secondary | ICD-10-CM | POA: Diagnosis not present

## 2023-05-22 ENCOUNTER — Encounter: Payer: Self-pay | Admitting: Nurse Practitioner

## 2023-05-22 ENCOUNTER — Non-Acute Institutional Stay (SKILLED_NURSING_FACILITY): Payer: Medicare Other | Admitting: Nurse Practitioner

## 2023-05-22 DIAGNOSIS — G301 Alzheimer's disease with late onset: Secondary | ICD-10-CM

## 2023-05-22 DIAGNOSIS — F02C11 Dementia in other diseases classified elsewhere, severe, with agitation: Secondary | ICD-10-CM | POA: Diagnosis not present

## 2023-05-22 DIAGNOSIS — Z515 Encounter for palliative care: Secondary | ICD-10-CM

## 2023-05-22 DIAGNOSIS — J690 Pneumonitis due to inhalation of food and vomit: Secondary | ICD-10-CM | POA: Diagnosis not present

## 2023-05-22 MED ORDER — LORAZEPAM 0.5 MG PO TABS
0.5000 mg | ORAL_TABLET | Freq: Three times a day (TID) | ORAL | 0 refills | Status: DC | PRN
Start: 2023-05-22 — End: 2023-05-31

## 2023-05-22 MED ORDER — ATROPINE SULFATE 1 % OP SOLN
2.0000 [drp] | OPHTHALMIC | Status: DC | PRN
Start: 2023-05-22 — End: 2023-05-31

## 2023-05-22 MED ORDER — MORPHINE SULFATE (CONCENTRATE) 10 MG/0.5ML PO SOLN
5.0000 mg | ORAL | 0 refills | Status: DC | PRN
Start: 2023-05-22 — End: 2023-05-31

## 2023-05-22 NOTE — Progress Notes (Signed)
Location:  Other [facility] [place].  Nursing Home Room Number: [facility] 103A Place of Service:  SNF (31) [name], NP  PCP: Elenore Paddy, NP  Patient Care Team: Elenore Paddy, NP as PCP - General (Nurse Practitioner) [place], Peter M, MD as PCP - Cardiology (Cardiology)  Extended Emergency Contact Information Primary Emergency Contact: [name],[name] Address: [address]          [place], [place] [address] [place] of [place] Home Phone: [phone] Mobile Phone: [phone] Relation: Spouse  Goals of care: Advanced Directive information    05/22/2023   12:04 PM  Advanced Directives  Does Patient Have a Medical Advance Directive? Yes  Type of Estate agent of [place];Out of facility DNR (pink MOST or yellow form);Living will  Does patient want to make changes to medical advance directive? No - Patient declined  Copy of Healthcare Power of Attorney in Chart? Yes - validated most recent copy scanned in chart (See row information)     Chief Complaint  Patient presents with   Medical Management of Chronic Issues    Medical Management of Chronic Issues.     HPI:  Pt is a 88 y.o. male seen today for medical management of chronic disease and due to acute change over the weekend.  Staff noted trouble swallowing last week and recommended diet change. Over the weekend noted to have low pulse, increase in congestion and decrease in responsiveness.  Over night he became worse and hospice nurse was called.  He has not been able to take any medication or intake.  Staff has been suctioning increase in oral secretions.    Past Medical History:  Diagnosis Date   Arthritis    Balance problem    Barrett esophagus    Bradycardia    Coronary artery disease    GERD (gastroesophageal reflux disease)    Gout    History of atrial fibrillation    Previously on amiodarone   Hx of CABG    Hypercholesterolemia    Hypertension    Left renal  mass    Melanoma (HCC)    Left shoulder blade   Nocturia    Right epiretinal membrane 02/10/2020   Past Surgical History:  Procedure Laterality Date   CARDIAC CATHETERIZATION  01/04/2010   COLONOSCOPY     CORONARY ARTERY BYPASS GRAFT  02/07/2010   LIMA to LAD, SVG to 2nd DX, SVG to OM   EYE SURGERY Bilateral    Cataract Extraction   HEMORRHOID SURGERY     x 2   HEMORRHOID SURGERY N/A 11/13/2012   Procedure: PPH Hemorrhoidectomy;  Surgeon: Robyne Askew, MD;  Location: [facility] OR;  Service: General;  Laterality: N/A;   INGUINAL HERNIA REPAIR Right 01/22/2017   Procedure: RIGHT INGUINAL HERNIA REPAIR WITH MESH;  Surgeon: Darnell Level, MD;  Location: [facility] OR;  Service: General;  Laterality: Right;   INSERTION OF MESH Right 01/22/2017   Procedure: INSERTION OF MESH;  Surgeon: Darnell Level, MD;  Location: [facility] OR;  Service: General;  Laterality: Right;   IR RADIOLOGIST EVAL & MGMT  01/06/2021   NASAL POLYP EXCISION     OTHER SURGICAL HISTORY  06/05/2002   Penile Implant   ROBOTIC ASSITED PARTIAL NEPHRECTOMY Left 02/25/2021   Procedure: XI ROBOTIC ASSITED PARTIAL NEPHRECTOMY;  Surgeon: Rene Paci, MD;  Location: [facility] ORS;  Service: Urology;  Laterality: Left;   UPPER GI ENDOSCOPY      Allergies  Allergen Reactions  Lexapro [Escitalopram]     Confusion, weakness   Namenda [Memantine]     Insomnia   Lisinopril Other (See Comments)    Cough    Outpatient Encounter Medications as of 05/22/2023  Medication Sig   acetaminophen (TYLENOL) 500 MG tablet Take 1,000 mg by mouth every 8 (eight) hours as needed.   amLODipine (NORVASC) 10 MG tablet TAKE 1 TABLET BY MOUTH EVERY DAY   ammonium lactate (AMLACTIN) 12 % cream Apply 1 g topically as needed for dry skin.   atorvastatin (LIPITOR) 10 MG tablet Take 0.5 tablets (5 mg total) by mouth daily at 6 PM.   atropine 1 % ophthalmic solution Place 2 drops into both eyes every 4 (four) hours as needed.   bisacodyl (DULCOLAX) 10 MG  suppository Place 10 mg rectally as directed. Every 24 hours as needed for constipation   cetirizine (ZYRTEC) 5 MG tablet Take 5 mg by mouth daily.   docusate sodium (COLACE) 100 MG capsule Take 1 capsule (100 mg total) by mouth 2 (two) times daily.   feeding supplement (ENSURE ENLIVE / ENSURE PLUS) LIQD Take 237 mLs by mouth 3 (three) times daily between meals.   isosorbide mononitrate (IMDUR) 60 MG 24 hr tablet Take 1 tablet (60 mg total) by mouth every evening. Skip the dose if systolic BP less than 140 mmHg   lidocaine (LIDODERM) 5 % Place 1 patch onto the skin daily. Remove & Discard patch within 12 hours or as directed by MD   LORazepam (ATIVAN) 0.5 MG tablet Take 0.5 mg by mouth every 8 (eight) hours as needed for anxiety.   losartan (COZAAR) 100 MG tablet Take 100 mg by mouth daily.   morphine 20 MG/5ML solution Take 0.25 mLs by mouth every hour as needed for pain.   Multiple Vitamin (MULTIVITAMIN) tablet Take 1 tablet by mouth daily.   mupirocin ointment (BACTROBAN) 2 % Apply 1 Application topically 2 (two) times daily.   nitroGLYCERIN (NITROSTAT) 0.4 MG SL tablet *PLACE 1 TABLET UNDER TONGUE EVERY 5 MINS., UP TO 3 DOSES AS NEEDED FOR CHEST PAIN*   omeprazole (PRILOSEC) 20 MG capsule Take 1 capsule (20 mg total) by mouth 2 (two) times daily.   polyethylene glycol (MIRALAX / GLYCOLAX) 17 g packet Take 17 g by mouth daily as needed.   tamsulosin (FLOMAX) 0.4 MG CAPS capsule Take 1 capsule (0.4 mg total) by mouth daily after supper.   Zinc Oxide (TRIPLE PASTE) 12.8 % ointment Apply 1 Application topically as needed for irritation.   No facility-administered encounter medications on file as of 05/22/2023.    Review of Systems  Unable to perform ROS: Acuity of condition    Immunization History  Administered Date(s) Administered   PFIZER(Purple Top)SARS-COV-2 Vaccination 08/25/2019, 09/15/2019, 05/18/2020   Pneumococcal Conjugate-13 05/20/2014   Tdap 11/25/2021   Pertinent  Health  Maintenance Due  Topic Date Due   INFLUENZA VACCINE  Never done      06/01/2022    2:42 PM 06/08/2022    2:51 PM 08/31/2022    1:07 PM 09/12/2022    3:31 PM 10/13/2022   12:38 PM  Fall Risk  Falls in the past year? 1 1 0 1 1  Was there an injury with Fall? 1 0 0 0 0  Fall Risk Category Calculator 3 2 0 2 2  Fall Risk Category (Retired) High Moderate     (RETIRED) Patient Fall Risk Level High fall risk Moderate fall risk     Patient at Risk  for Falls Due to History of fall(s)  History of fall(s)    Patient at Risk for Falls Due to - Comments   cane    Fall risk Follow up Falls evaluation completed Falls evaluation completed Falls prevention discussed Falls evaluation completed Falls evaluation completed   Functional Status Survey:    Vitals:   05/22/23 1155  BP: (!) 137/53  Pulse: (!) 34  Resp: 20  Temp: (!) 97.4 F (36.3 C)  SpO2: 95%  Weight: 184 lb (83.5 kg)  Height: 5\' 10"  (1.778 m)   Body mass index is 26.4 kg/m. Physical Exam Constitutional:      General: He is sleeping. He is not in acute distress.    Appearance: He is well-developed. He is not diaphoretic.     Comments: Does not respond to stimuli  HENT:     Head: Normocephalic and atraumatic.     Right Ear: External ear normal.     Left Ear: External ear normal.     Mouth/Throat:     Pharynx: No oropharyngeal exudate.     Comments: Increase amounts of oral secretions noted Cardiovascular:     Rate and Rhythm: Regular rhythm. Bradycardia present.     Heart sounds: Normal heart sounds.  Pulmonary:     Effort: Tachypnea and accessory muscle usage present.     Breath sounds: Rhonchi and rales present.     Comments: Rhonchi and rales noted throughout all lung fields Increase RR and muscle use however patient is sleeping and does not appear to be in distress Abdominal:     General: Bowel sounds are normal.     Palpations: Abdomen is soft.  Musculoskeletal:     Cervical back: Normal range of motion and neck  supple.     Right lower leg: No edema.     Left lower leg: No edema.  Skin:    General: Skin is warm and dry.     Labs reviewed: Recent Labs    11/10/22 0336 11/11/22 0416 11/13/22 0600 11/23/22 0000 03/12/23 0000  NA 140 137 141 148* 142  K 3.5 3.4* 3.8 4.0 3.9  CL 106 106 107 110* 103  CO2 25 22 26  28* 29*  GLUCOSE 102* 114* 118*  --   --   BUN 23 25* 31* 62* 21  CREATININE 0.99 1.16 0.96 1.3 1.0  CALCIUM 9.0 8.8* 9.0 9.8 8.9  MG 2.0 2.0 2.2  --   --   PHOS 3.3 3.6 3.6  --   --    Recent Labs    09/28/22 1756 11/09/22 0103 11/23/22 0000  AST 14* 18 83*  ALT 9 13 175*  ALKPHOS 55 59 95  BILITOT 0.6 0.8  --   PROT 6.6 6.3*  --   ALBUMIN 3.9 3.9 3.4*   Recent Labs    11/09/22 0103 11/10/22 0336 11/11/22 0416 11/13/22 0600 11/23/22 0000  WBC 10.1 7.9 9.4 7.8 20.2  NEUTROABS 7.4  --   --   --  14,483.00  HGB 11.2* 12.0* 10.8* 10.5* 12.4*  HCT 33.9* 35.1* 31.9* 31.1* 38*  MCV 101.5* 99.7 100.6* 100.3*  --   PLT 155 170 173 163 261   Lab Results  Component Value Date   TSH 2.78 03/06/2022   Lab Results  Component Value Date   HGBA1C 5.7 01/27/2022   Lab Results  Component Value Date   CHOL 112 11/24/2021   HDL 37 (L) 11/24/2021   LDLCALC 53 11/24/2021   TRIG  122 11/24/2021   CHOLHDL 3.0 11/24/2021    Significant Diagnostic Results in last 30 days:  No results found.  Assessment/Plan 1. Aspiration pneumonia of both lungs, unspecified aspiration pneumonia type, unspecified part of lung (HCC) Suspect aspiration pneumonia due to advance dementia with trouble swallowing and increased RR, decrease in sats.  Comfort measures at this time 2. 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   3. Hospice care patient Continue supportive care.  Wife requesting he move to hospice house and staff working to make this happen.  - Morphine Sulfate (MORPHINE  CONCENTRATE) 10 MG/0.5ML SOLN concentrated solution; Take 0.25 mLs (5 mg total) by mouth every 2 (two) hours as needed.  Dispense: 15 mL; Refill: 0 - atropine 1 % ophthalmic solution; Place 2 drops under the tongue every 4 (four) hours as needed. - LORazepam (ATIVAN) 0.5 MG tablet; Take 1 tablet (0.5 mg total) by mouth every 8 (eight) hours as needed for anxiety.  Dispense: 30 tablet; Refill: 0     Zachary Berthold K. Biagio Borg [facility] [phone]

## 2023-05-31 ENCOUNTER — Telehealth: Payer: Self-pay | Admitting: Nurse Practitioner

## 2023-05-31 NOTE — Telephone Encounter (Signed)
Patients wife asked me to inform you of Zachary Hall passing DOD: .  [facility] Care Guide [facility] AWV TEAM Direct Dial: [phone]

## 2023-06-01 DEATH — deceased

## 2023-09-17 ENCOUNTER — Ambulatory Visit: Payer: Medicare Other | Admitting: Neurology

## 2024-02-28 IMAGING — CT CT CERVICAL SPINE W/O CM
3 of 5 series · 10 of 33 positions shown, 11 images · non-contrast
Comparison: None.

CLINICAL DATA: Status post fall.



[Series 6: sagittal bone · sagittal · 0.26mm/px · 5 of 62 slices shown]
[im 11/62  bone]
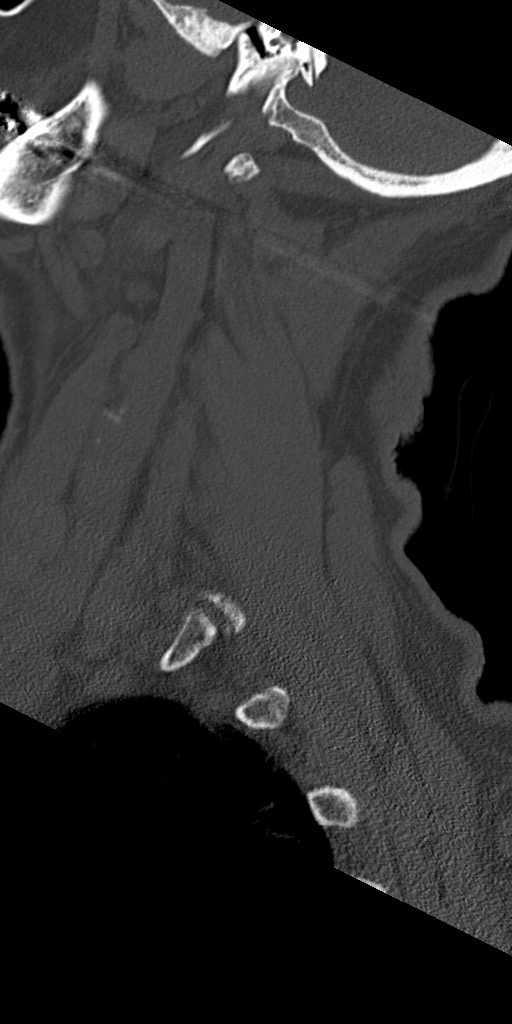
[im 21/62  bone]
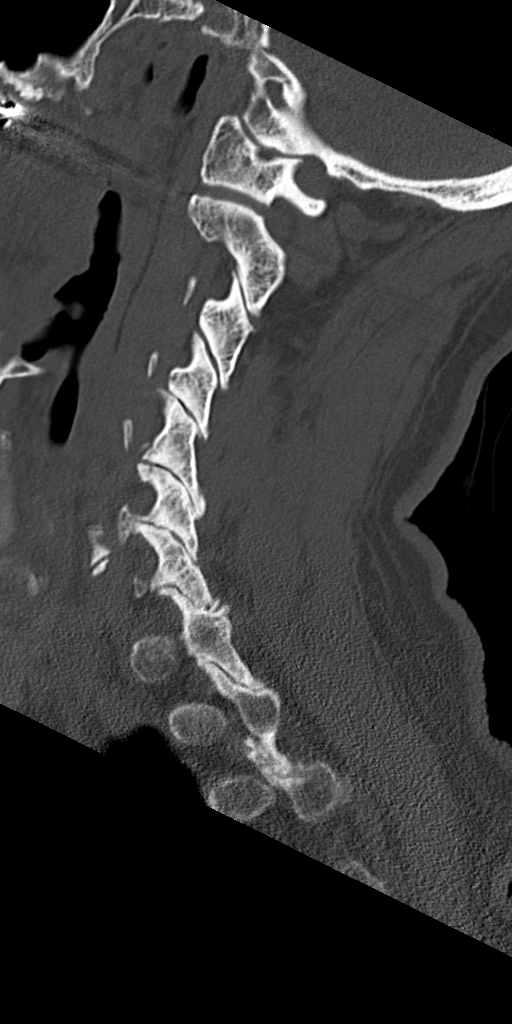
[im 31/62  bone]
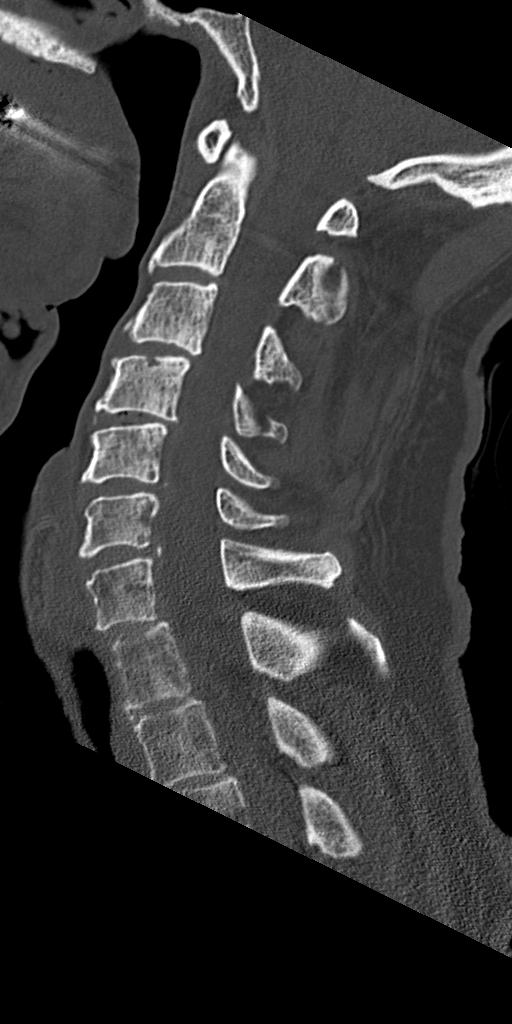
[im 41/62  bone]
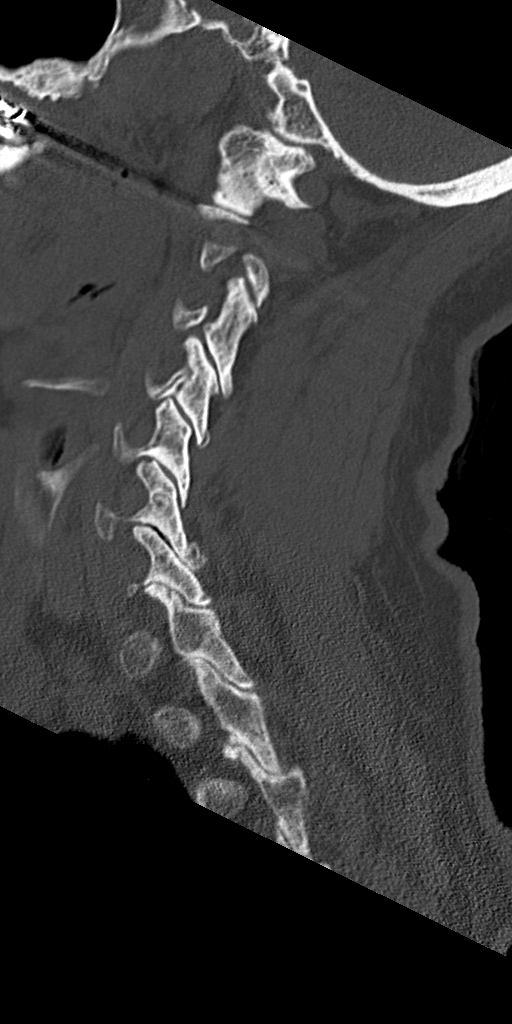
[im 51/62  bone]
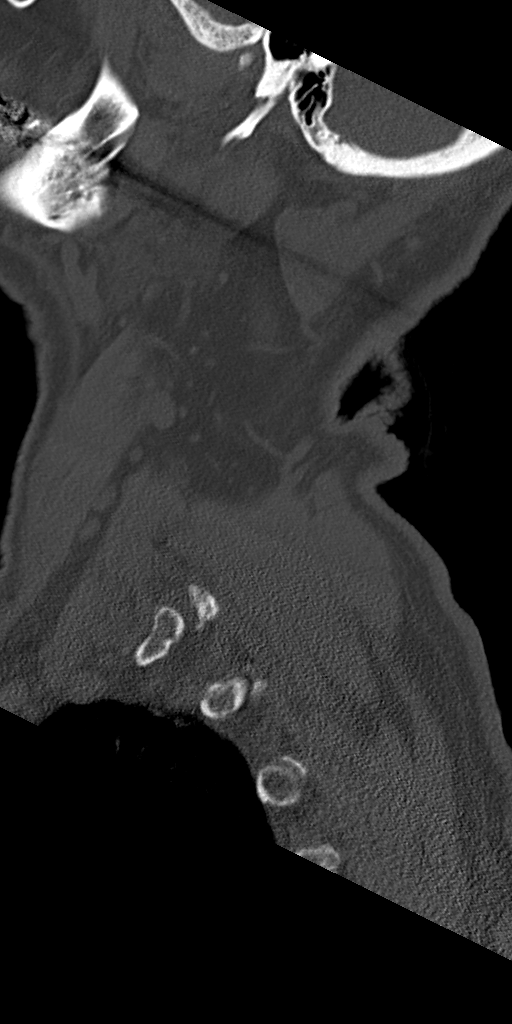

[Series 7: coronal bone · coronal · 0.24mm/px · 3 of 68 slices shown]
[im 18/68  bone]
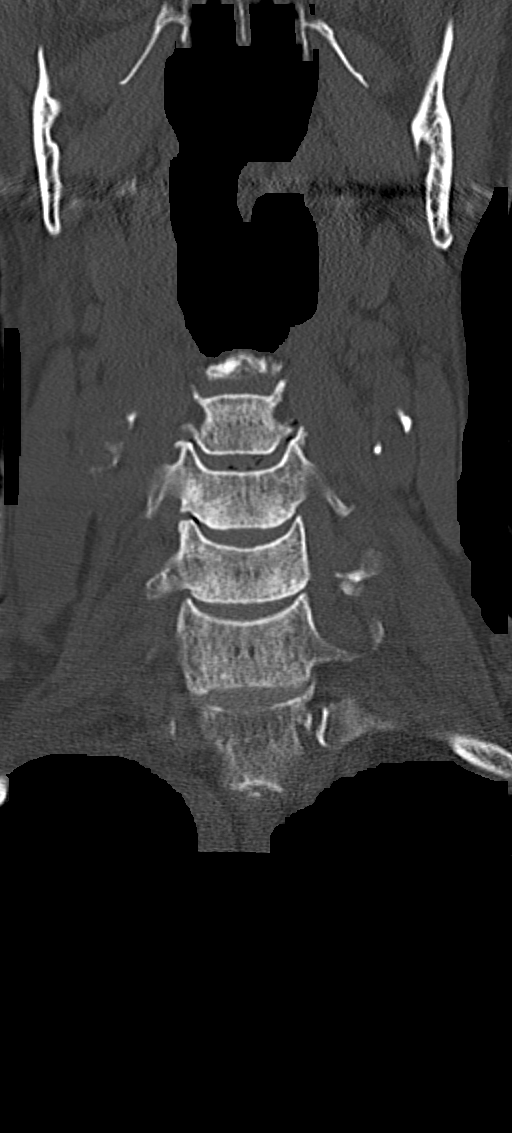
[im 29/68  bone]
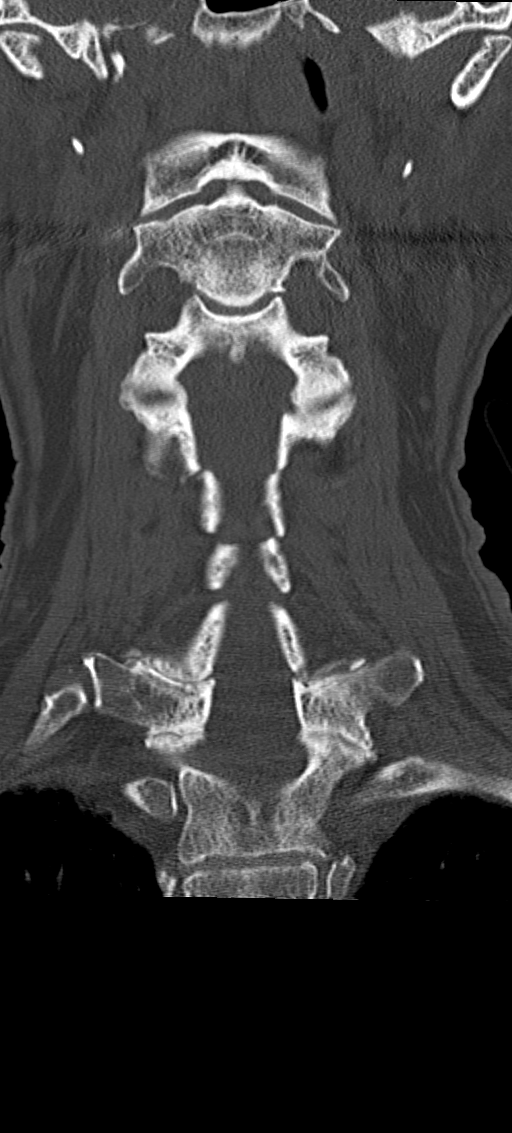
[im 40/68  bone]
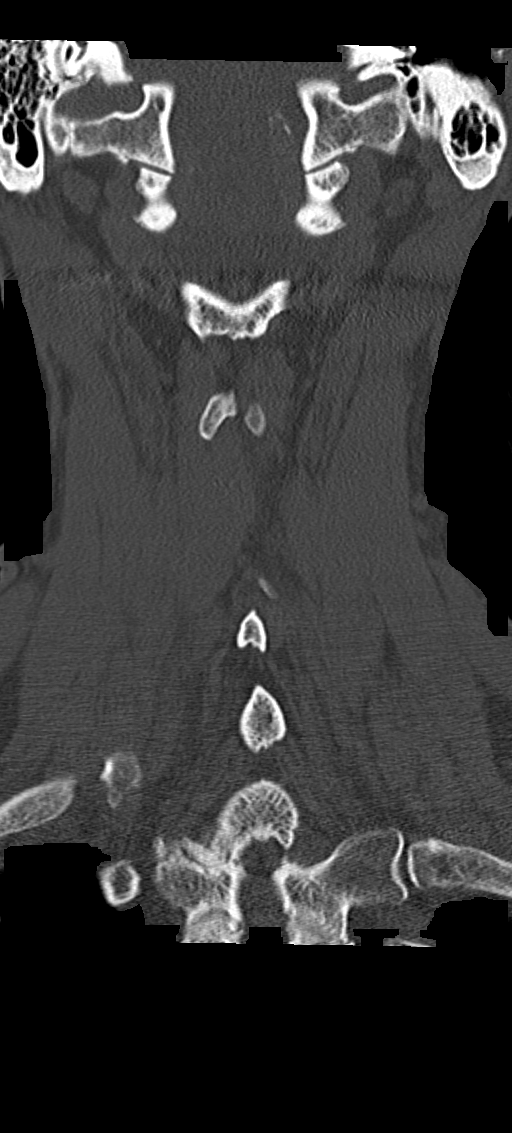

[Series 9: orthogonal bone · axial · 0.24mm/px · z∈[-312,-215]mm · 2 of 128 slices shown, 3 images]
[im 37/128  soft-tissue]
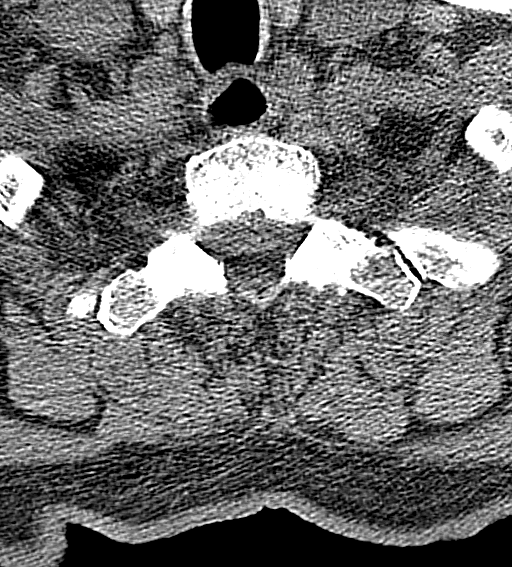
[im 37/128  bone]
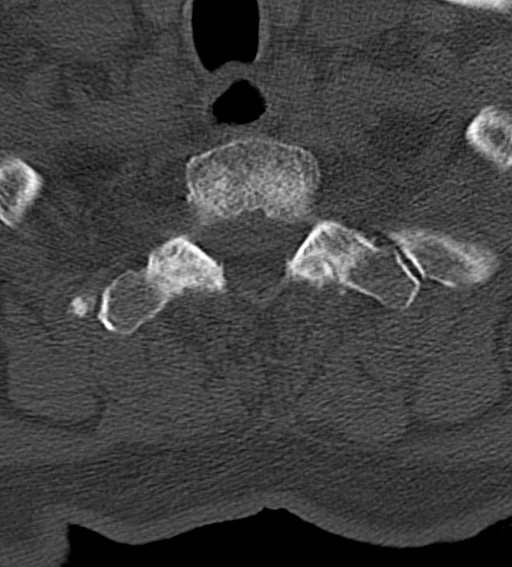
[im 91/128  bone]
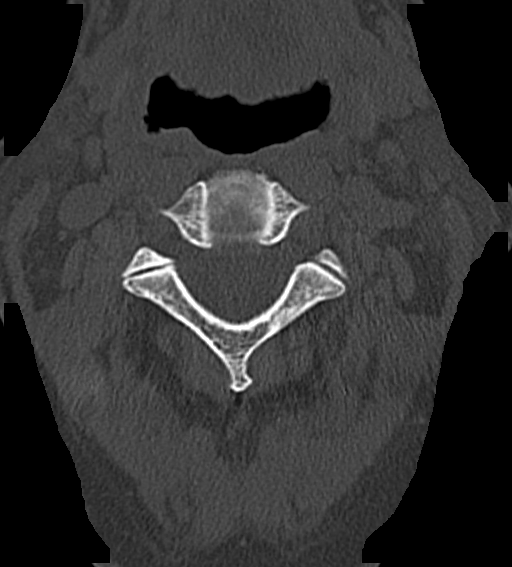

[10 of 33 positions shown; findings below may reference images not displayed]

FINDINGS: Alignment: Normal.

Skull base and vertebrae: No acute fracture. No primary bone lesion
or focal pathologic process.

Soft tissues and spinal canal: No prevertebral fluid or swelling. No
visible canal hematoma.

Disc levels: Mild multilevel endplate sclerosis and mild to moderate
severity osteophyte formation are seen at the levels of C2-C3,
C3-C4, C4-C5, C5-C6 and C6-C7.

There is moderate severity narrowing of the anterior atlantoaxial
articulation. Mild to moderate severity intervertebral disc space
narrowing is seen throughout all levels of the cervical spine.

Bilateral moderate to marked severity multilevel facet joint
hypertrophy is noted.

Upper chest: Negative.

Other: None.
IMPRESSION: 1. No acute fracture or subluxation in the cervical spine.
2. Multilevel degenerative changes, as described above.

## 2024-02-28 IMAGING — CT CT HEAD W/O CM
4 series · 16 of 47 positions shown, 18 images · non-contrast
Comparison: January 04, 2011

CLINICAL DATA: Status post fall.



[Series 2: head wo · axial · 0.48mm/px · z∈[-114,+6]mm · 7 of 33 slices shown, 9 images]
[im 5/33  brain]
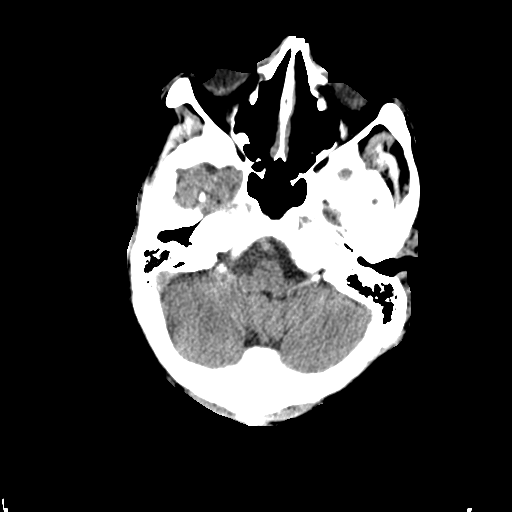
[im 5/33  bone]
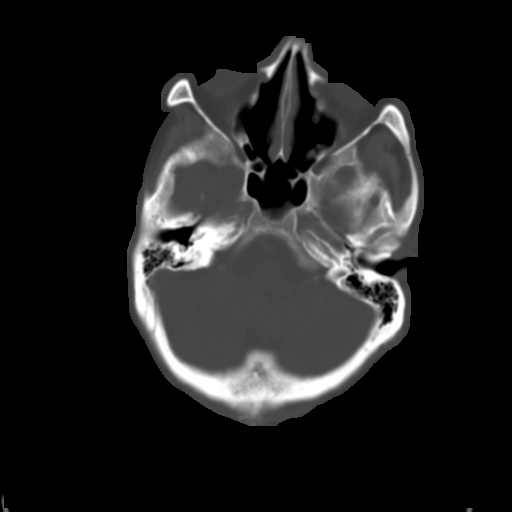
[im 9/33  brain]
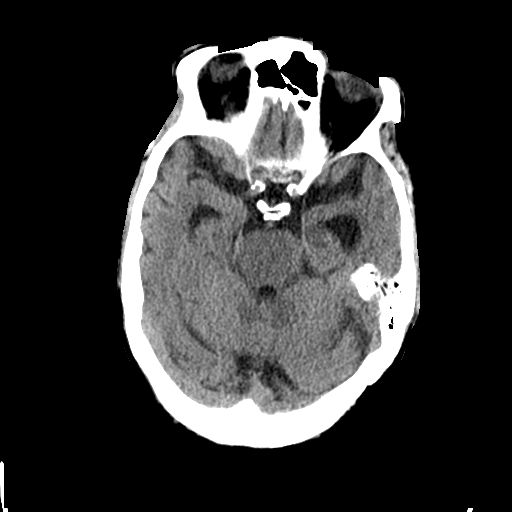
[im 13/33  brain]
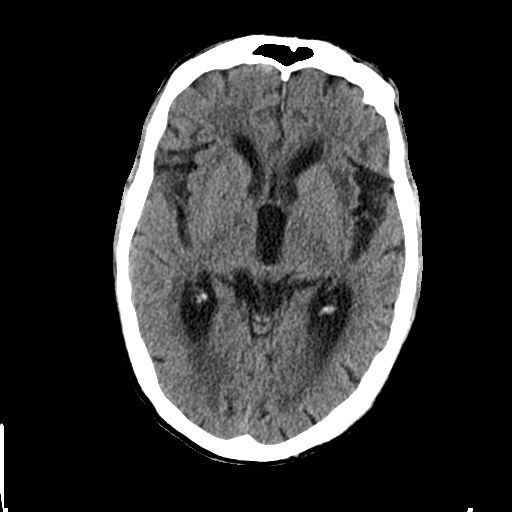
[im 17/33  brain]
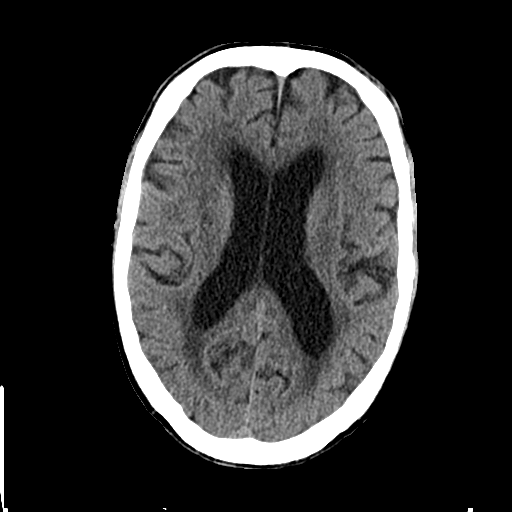
[im 21/33  brain]
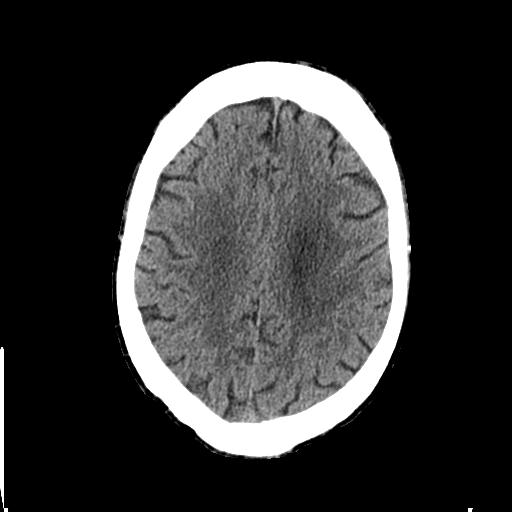
[im 21/33  bone]
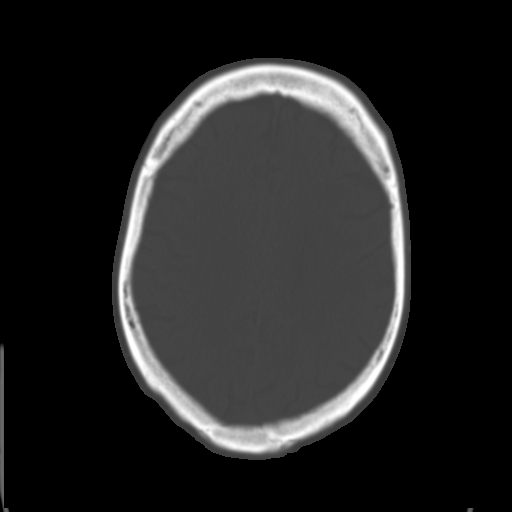
[im 25/33  brain]
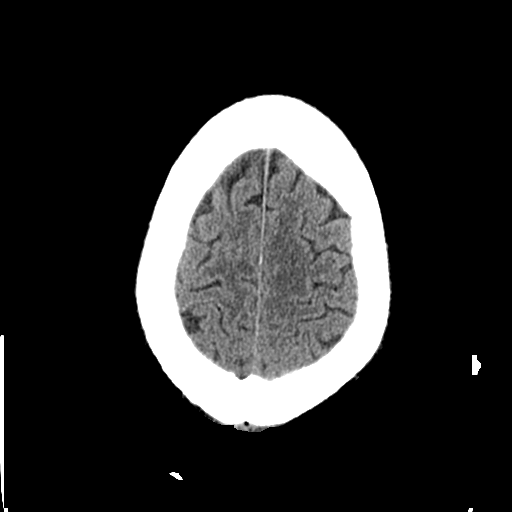
[im 29/33  brain]
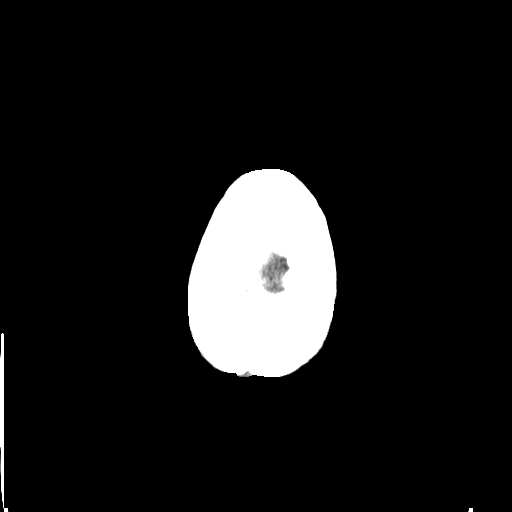

[Series 3: head bone · axial · 0.48mm/px · z∈[-118,-86]mm · 3 of 81 slices shown]
[im 9/81  bone]
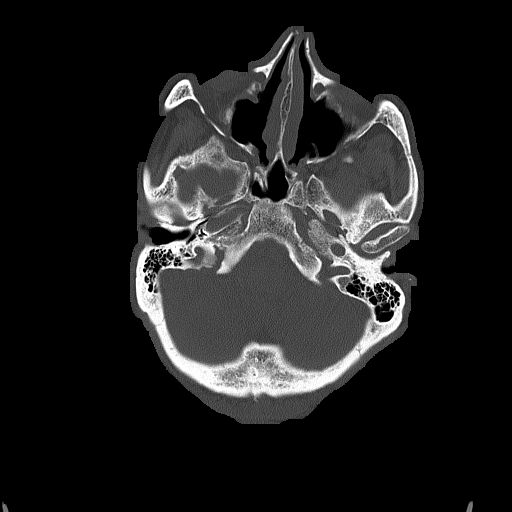
[im 17/81  bone]
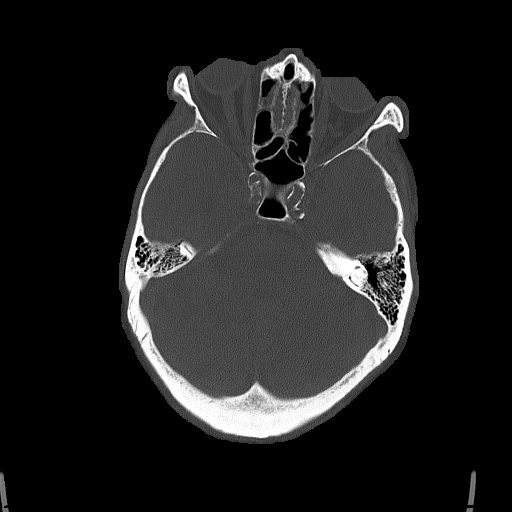
[im 25/81  bone]
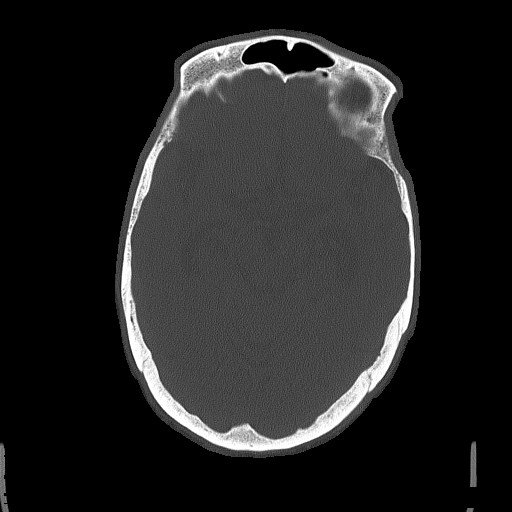

[Series 4: coronal soft tissue · coronal · 0.31mm/px · 3 of 72 slices shown]
[im 24/72  brain]
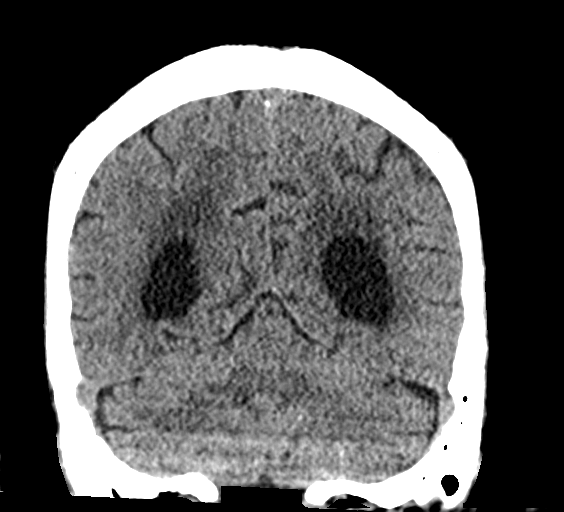
[im 32/72  brain]
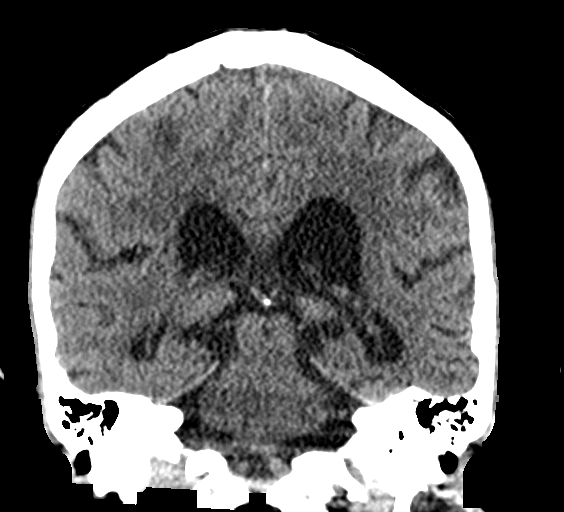
[im 40/72  brain]
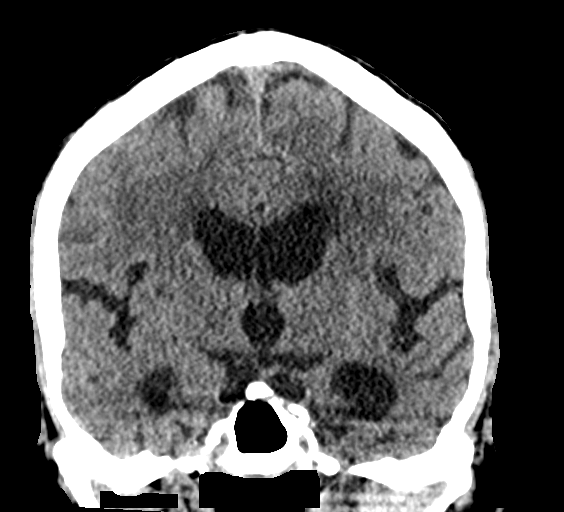

[Series 5: sagittal soft tissue · sagittal · 0.31mm/px · 3 of 59 slices shown]
[im 20/59  brain]
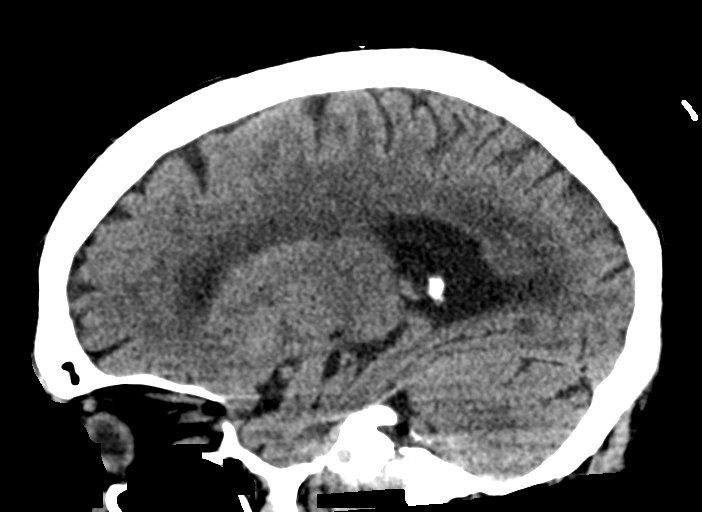
[im 30/59  brain]
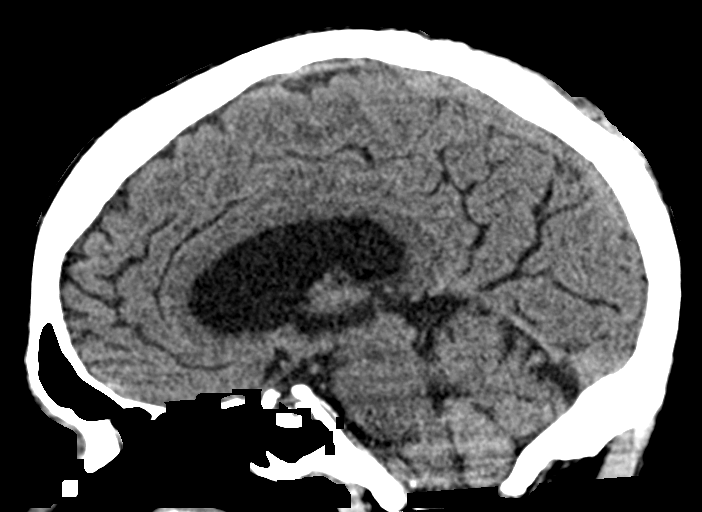
[im 39/59  brain]
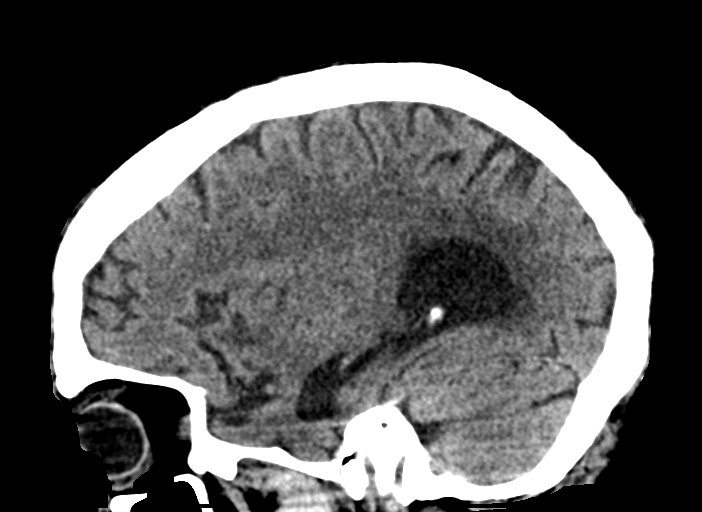

[16 of 47 positions shown; findings below may reference images not displayed]

FINDINGS: Brain: There is mild cerebral atrophy with widening of the
extra-axial spaces and ventricular dilatation.
There are areas of decreased attenuation within the white matter
tracts of the supratentorial brain, consistent with microvascular
disease changes.

Vascular: No hyperdense vessel or unexpected calcification.

Skull: Normal. Negative for fracture or focal lesion.

Sinuses/Orbits: No acute finding.

Other: Mild midline posterior parietal scalp soft tissue swelling is
seen near the vertex.
IMPRESSION: Mild midline posterior parietal scalp soft tissue swelling near the
vertex without evidence of an acute fracture or acute intracranial
abnormality.
